# Patient Record
Sex: Male | Born: 1990
Health system: Southern US, Community
[De-identification: ages and names within clinical notes are randomized; demographics above are authoritative.]

## PROBLEM LIST (undated history)

## (undated) DIAGNOSIS — F319 Bipolar disorder, unspecified: Secondary | ICD-10-CM

## (undated) DIAGNOSIS — R443 Hallucinations, unspecified: Secondary | ICD-10-CM

## (undated) DIAGNOSIS — F259 Schizoaffective disorder, unspecified: Secondary | ICD-10-CM

## (undated) DIAGNOSIS — F419 Anxiety disorder, unspecified: Secondary | ICD-10-CM

## (undated) DIAGNOSIS — J45909 Unspecified asthma, uncomplicated: Secondary | ICD-10-CM

## (undated) DIAGNOSIS — F209 Schizophrenia, unspecified: Secondary | ICD-10-CM

---
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
## 2013-05-09 ENCOUNTER — Emergency Department (HOSPITAL_COMMUNITY)
Admission: EM | Admit: 2013-05-09 | Discharge: 2013-05-09 | Disposition: A | Discharge status: Home / Self Care | Payer: 59 | Attending: Emergency Medicine | Admitting: Emergency Medicine

## 2013-05-09 ENCOUNTER — Emergency Department (HOSPITAL_COMMUNITY): Payer: 59

## 2013-05-09 ENCOUNTER — Encounter (HOSPITAL_COMMUNITY): Payer: Self-pay | Admitting: Emergency Medicine

## 2013-05-09 DIAGNOSIS — Z5900 Homelessness unspecified: Secondary | ICD-10-CM | POA: Insufficient documentation

## 2013-05-09 DIAGNOSIS — R079 Chest pain, unspecified: Secondary | ICD-10-CM | POA: Insufficient documentation

## 2013-05-09 DIAGNOSIS — Z8659 Personal history of other mental and behavioral disorders: Secondary | ICD-10-CM | POA: Insufficient documentation

## 2013-05-09 DIAGNOSIS — R52 Pain, unspecified: Secondary | ICD-10-CM | POA: Insufficient documentation

## 2013-05-09 DIAGNOSIS — F411 Generalized anxiety disorder: Secondary | ICD-10-CM | POA: Insufficient documentation

## 2013-05-09 HISTORY — DX: Schizophrenia, unspecified: F20.9

## 2013-05-09 LAB — CBC
HEMATOCRIT: 46.2 % (ref 39.0–52.0)
Hemoglobin: 16.6 g/dL (ref 13.0–17.0)
MCH: 32.5 pg (ref 26.0–34.0)
MCHC: 35.9 g/dL (ref 30.0–36.0)
MCV: 90.4 fL (ref 78.0–100.0)
Platelets: 183 10*3/uL (ref 150–400)
RBC: 5.11 MIL/uL (ref 4.22–5.81)
RDW: 13.1 % (ref 11.5–15.5)
WBC: 7.9 10*3/uL (ref 4.0–10.5)

## 2013-05-09 LAB — BASIC METABOLIC PANEL
BUN: 20 mg/dL (ref 6–23)
CO2: 24 mEq/L (ref 19–32)
CREATININE: 0.75 mg/dL (ref 0.50–1.35)
Calcium: 9.7 mg/dL (ref 8.4–10.5)
Chloride: 100 mEq/L (ref 96–112)
GFR calc Af Amer: >90 mL/min (ref >90)
Glucose, Bld: 86 mg/dL (ref 70–99)
POTASSIUM: 4.7 meq/L (ref 3.7–5.3)
Sodium: 138 mEq/L (ref 137–147)

## 2013-05-09 LAB — I-STAT TROPONIN, ED: TROPONIN I, POC: 0 ng/mL (ref 0.00–0.08)

## 2013-05-09 MED ORDER — NAPROXEN 250 MG PO TABS
500.0000 mg | ORAL_TABLET | Freq: Once | ORAL | Status: AC
  Administered 2013-05-09: 500 mg via ORAL
  Filled 2013-05-09: qty 2

## 2013-05-09 MED ORDER — NAPROXEN 500 MG PO TABS: 500.0000 mg | ORAL_TABLET | Freq: Two times a day (BID) | ORAL | Status: DC

## 2013-05-09 NOTE — Discharge Instructions (Signed)
Your caregiver has diagnosed you as having chest pain that is nonspecific for one problem. This means that after looking at you and examining you and ordering tests (such as blood work, chest x-rays and EKG), your caregiver does not believe that the problem is serious enough to need watching in the hospital. This judgment is often made after testing shows no acute heart attack and you are at low risk for sudden acute heart condition. Chest pain comes from many different causes.  Seek immediate medical attention if:   You have severe chest pain, especially if the pain is crushing or pressure-like and spreads to the arms, back, neck, or jaw, or if you have sweating, nausea, shortness of breath. This is an emergency. Don't wait to see if the pain will go away. Get medical help at once. Call 911 immediately. Do not drive herself to the hospital.   Your chest pain gets worse and does not go away with rest.   You have an attack of chest pain lasting longer than usual, despite rest and treatment with the medications your caregiver has prescribed   You awaken from sleep with chest pain or shortness of breath.   You feel faint or dizzy   You have chest pain not typical of your usual pain for which you originally saw your caregiver.  You must have a repeat evaluation within 24 hours for a recheck of your heart.  Please call your doctor this morning to schedule this appointment. If you do not have a family doctor, please see the list of doctors below.  RESOURCE GUIDE  Dental Problems  Patients with Medicaid: [facility]                      Dental [address]                                           [address] Phone:  [phone]                                                  Phone:  [phone]  If unable to pay or uninsured, contact:  [facility] or [facility] to become qualified for the adult dental clinic.  Chronic Pain  Problems Contact [facility]  [phone] Patients need to be referred by their primary care doctor.  Insufficient Money for Medicine Contact [employer]:  call "211" or [facility] 271-5999.  No Primary Care Doctor Call [facility]  832-8000 Other agencies that provide inexpensive medical care    [facility]  832-8035    [facility]  [phone]    [facility]  [phone]    [facility]  832-4777    [facility]  [phone]    [facility]  [phone]  Psychological Services [facility]  [phone] [facility]  [phone] [facility]   800 853-5163 (emergency services 641-4993)  Substance Abuse Resources [facility]  336-882-2125 [facility] 336-784-9470 [facility] 336-285-9073 [facility] 336-845-3988 Residential & Outpatient Substance Abuse Program  800-659-3381  Abuse/Neglect [place] Child Abuse Hotline (336) 641-3795 [place] [facility] Abuse Hotline 800-378-5315 (After Hours)  Emergency Shelter [facility] (336) 271-5985  [facility] (336) 275-9566 [facility] (704) 372-4663  MRSA Hotline #:   832-7006    [place] [facility]  [facility] [place]     [employer]                          [facility] [address] [place]                       [address]      [address]  [place]                                                [place]                            [place] Phone:  [phone]                                   Phone:  [phone]                 Phone:  [phone]  [facility] Phone:  [phone]  [place] Child Abuse Hotline (336) 342-1394 (336) 342-3537 (After Hours)    

## 2013-05-09 NOTE — ED Notes (Signed)
Pt. reports mid chest pain for 2 weeks denies injury , no SOB or nausea.
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
## 2013-06-13 ENCOUNTER — Inpatient Hospital Stay (HOSPITAL_COMMUNITY)
Admission: EM | Admit: 2013-06-13 | Discharge: 2013-06-18 | DRG: 885 | Disposition: A | Discharge status: Home / Self Care | Payer: 59 | Source: Intra-hospital | Attending: Psychiatry | Admitting: Psychiatry

## 2013-06-13 ENCOUNTER — Encounter (HOSPITAL_COMMUNITY): Payer: Self-pay | Admitting: Emergency Medicine

## 2013-06-13 ENCOUNTER — Encounter (HOSPITAL_COMMUNITY): Payer: Self-pay | Admitting: *Deleted

## 2013-06-13 ENCOUNTER — Emergency Department (HOSPITAL_COMMUNITY)
Admission: EM | Admit: 2013-06-13 | Discharge: 2013-06-13 | Disposition: A | Discharge status: Other Acute Inpatient Hospital | Payer: 59 | Attending: Emergency Medicine | Admitting: Emergency Medicine

## 2013-06-13 DIAGNOSIS — F259 Schizoaffective disorder, unspecified: Secondary | ICD-10-CM

## 2013-06-13 DIAGNOSIS — F329 Major depressive disorder, single episode, unspecified: Secondary | ICD-10-CM | POA: Insufficient documentation

## 2013-06-13 DIAGNOSIS — R443 Hallucinations, unspecified: Secondary | ICD-10-CM | POA: Insufficient documentation

## 2013-06-13 DIAGNOSIS — F17200 Nicotine dependence, unspecified, uncomplicated: Secondary | ICD-10-CM | POA: Diagnosis present

## 2013-06-13 DIAGNOSIS — F1910 Other psychoactive substance abuse, uncomplicated: Secondary | ICD-10-CM | POA: Diagnosis present

## 2013-06-13 DIAGNOSIS — Z5900 Homelessness unspecified: Secondary | ICD-10-CM

## 2013-06-13 DIAGNOSIS — Z87898 Personal history of other specified conditions: Secondary | ICD-10-CM

## 2013-06-13 DIAGNOSIS — R079 Chest pain, unspecified: Secondary | ICD-10-CM | POA: Insufficient documentation

## 2013-06-13 DIAGNOSIS — R45851 Suicidal ideations: Secondary | ICD-10-CM | POA: Insufficient documentation

## 2013-06-13 DIAGNOSIS — F209 Schizophrenia, unspecified: Secondary | ICD-10-CM | POA: Insufficient documentation

## 2013-06-13 DIAGNOSIS — F919 Conduct disorder, unspecified: Secondary | ICD-10-CM | POA: Insufficient documentation

## 2013-06-13 DIAGNOSIS — F29 Unspecified psychosis not due to a substance or known physiological condition: Secondary | ICD-10-CM | POA: Diagnosis present

## 2013-06-13 DIAGNOSIS — Z79899 Other long term (current) drug therapy: Secondary | ICD-10-CM | POA: Insufficient documentation

## 2013-06-13 DIAGNOSIS — F2081 Schizophreniform disorder: Principal | ICD-10-CM | POA: Diagnosis present

## 2013-06-13 DIAGNOSIS — F3289 Other specified depressive episodes: Secondary | ICD-10-CM | POA: Insufficient documentation

## 2013-06-13 DIAGNOSIS — J45909 Unspecified asthma, uncomplicated: Secondary | ICD-10-CM | POA: Diagnosis present

## 2013-06-13 HISTORY — DX: Anxiety disorder, unspecified: F41.9

## 2013-06-13 HISTORY — DX: Unspecified asthma, uncomplicated: J45.909

## 2013-06-13 LAB — COMPREHENSIVE METABOLIC PANEL
ALT: 22 U/L (ref 0–53)
AST: 20 U/L (ref 0–37)
Albumin: 4.4 g/dL (ref 3.5–5.2)
Alkaline Phosphatase: 67 U/L (ref 39–117)
BUN: 14 mg/dL (ref 6–23)
CO2: 24 mEq/L (ref 19–32)
CREATININE: 0.93 mg/dL (ref 0.50–1.35)
Calcium: 10.3 mg/dL (ref 8.4–10.5)
Chloride: 103 mEq/L (ref 96–112)
GFR calc Af Amer: >90 mL/min (ref >90)
GFR calc non Af Amer: >90 mL/min (ref >90)
Glucose, Bld: 74 mg/dL (ref 70–99)
Potassium: 4.1 mEq/L (ref 3.7–5.3)
SODIUM: 140 meq/L (ref 137–147)
TOTAL PROTEIN: 8.2 g/dL (ref 6.0–8.3)
Total Bilirubin: 0.4 mg/dL (ref 0.3–1.2)

## 2013-06-13 LAB — CBC
HCT: 46.4 % (ref 39.0–52.0)
Hemoglobin: 16.3 g/dL (ref 13.0–17.0)
MCH: 31.5 pg (ref 26.0–34.0)
MCHC: 35.1 g/dL (ref 30.0–36.0)
MCV: 89.7 fL (ref 78.0–100.0)
PLATELETS: 201 10*3/uL (ref 150–400)
RBC: 5.17 MIL/uL (ref 4.22–5.81)
RDW: 12.8 % (ref 11.5–15.5)
WBC: 7.5 10*3/uL (ref 4.0–10.5)

## 2013-06-13 LAB — RAPID URINE DRUG SCREEN, HOSP PERFORMED
Amphetamines: NOT DETECTED
BENZODIAZEPINES: NOT DETECTED
Barbiturates: NOT DETECTED
COCAINE: NOT DETECTED
OPIATES: NOT DETECTED
TETRAHYDROCANNABINOL: NOT DETECTED

## 2013-06-13 LAB — ETHANOL: Alcohol, Ethyl (B): <11 mg/dL (ref 0–11)

## 2013-06-13 LAB — LITHIUM LEVEL: Lithium Lvl: <0.25 mEq/L — ABNORMAL LOW (ref 0.80–1.40)

## 2013-06-13 MED ORDER — ACETAMINOPHEN 325 MG PO TABS: 650.0000 mg | ORAL_TABLET | Freq: Four times a day (QID) | ORAL | Status: DC | PRN

## 2013-06-13 MED ORDER — IBUPROFEN 200 MG PO TABS: 600.0000 mg | ORAL_TABLET | Freq: Three times a day (TID) | ORAL | Status: DC | PRN

## 2013-06-13 MED ORDER — ALUM & MAG HYDROXIDE-SIMETH 200-200-20 MG/5ML PO SUSP: 30.0000 mL | ORAL | Status: DC | PRN

## 2013-06-13 MED ORDER — QUETIAPINE FUMARATE 200 MG PO TABS
200.0000 mg | ORAL_TABLET | Freq: Every day | ORAL | Status: DC
  Administered 2013-06-13: 200 mg via ORAL
  Filled 2013-06-13 (×4): qty 1

## 2013-06-13 MED ORDER — MAGNESIUM HYDROXIDE 400 MG/5ML PO SUSP: 30.0000 mL | Freq: Every day | ORAL | Status: DC | PRN

## 2013-06-13 MED ORDER — ZOLPIDEM TARTRATE 5 MG PO TABS: 5.0000 mg | ORAL_TABLET | Freq: Every evening | ORAL | Status: DC | PRN

## 2013-06-13 MED ORDER — NICOTINE 14 MG/24HR TD PT24
14.0000 mg | MEDICATED_PATCH | Freq: Every day | TRANSDERMAL | Status: DC
  Administered 2013-06-15 – 2013-06-16 (×2): 14 mg via TRANSDERMAL
  Filled 2013-06-13 (×8): qty 1

## 2013-06-13 MED ORDER — NICOTINE 21 MG/24HR TD PT24
21.0000 mg | MEDICATED_PATCH | Freq: Every day | TRANSDERMAL | Status: DC
  Administered 2013-06-13: 21 mg via TRANSDERMAL
  Filled 2013-06-13: qty 1

## 2013-06-13 MED ORDER — ONDANSETRON HCL 4 MG PO TABS: 4.0000 mg | ORAL_TABLET | Freq: Three times a day (TID) | ORAL | Status: DC | PRN

## 2013-06-13 MED ORDER — QUETIAPINE FUMARATE 50 MG PO TABS: 50.0000 mg | ORAL_TABLET | ORAL | Status: DC | PRN

## 2013-06-13 MED ORDER — ACETAMINOPHEN 325 MG PO TABS: 650.0000 mg | ORAL_TABLET | ORAL | Status: DC | PRN

## 2013-06-13 NOTE — ED Provider Notes (Signed)
CSN: [id number]     Arrival date & time 06/13/13  1606 History   First MD Initiated Contact with Patient 06/13/13 1637     Chief Complaint  Patient presents with  . Medical Clearance     (Consider location/radiation/quality/duration/timing/severity/associated sxs/prior Treatment) The history is provided by the patient.   patient presents with depression. He's had suicidal thoughts. He states it his been going for years but got worse now. He states that he just lost his house and is going to be homeless again. He states his been hearing voices also. Patient states he is on Latuda, lithium and Seroquel. Among other medications. He states he does drink, but does not think he will go into withdrawal. He also smokes cigarettes heavily. He also states she's been getting chest pain for months. He was seen in the ED for that recently.  Past Medical History  Diagnosis Date  . Schizophrenia    No past surgical history on file. No family history on file. History  Substance Use Topics  . Smoking status: Never Smoker   . Smokeless tobacco: Not on file  . Alcohol Use: Yes    Review of Systems  Constitutional: Negative for activity change and appetite change.  Eyes: Negative for pain.  Respiratory: Negative for chest tightness and shortness of breath.   Cardiovascular: Positive for chest pain. Negative for leg swelling.  Gastrointestinal: Negative for nausea, vomiting, abdominal pain and diarrhea.  Genitourinary: Negative for flank pain.  Musculoskeletal: Negative for back pain and neck stiffness.  Skin: Negative for rash.  Neurological: Negative for weakness, numbness and headaches.  Psychiatric/Behavioral: Positive for suicidal ideas and behavioral problems.      Allergies  Review of patient's allergies indicates no known allergies.  Home Medications   Current Outpatient Rx  Name  Route  Sig  Dispense  Refill  . Ginkgo Biloba 100 MG CAPS   Oral   Take 1 capsule by mouth 3 (three)  times daily.         . haloperidol (HALDOL) 2 MG tablet   Oral   Take 2 mg by mouth at bedtime.         Marland Kitchen lurasidone (LATUDA) 40 MG TABS tablet   Oral   Take 40 mg by mouth at bedtime. Pt taking 40 mg nightly x 1 week, and then 20 mg weekly x 1 week and then to stop this medication. Pt to start this dose tonight. Was on 60 mg before today.         Marland Kitchen QUEtiapine Fumarate (SEROQUEL XR) 150 MG 24 hr tablet   Oral   Take 150 mg by mouth at bedtime. Pt to take 150 mg x 4 days and then increase to 300 mg x 4 days and then increase to 400 mg.         . sertraline (ZOLOFT) 100 MG tablet   Oral   Take 100 mg by mouth at bedtime.         . vitamin E 400 UNIT capsule   Oral   Take 400 Units by mouth at bedtime.          BP 143/84  Pulse 95  Temp(Src) 98.6 F (37 C) (Oral)  Resp 16  SpO2 100% Physical Exam  Nursing note and vitals reviewed. Constitutional: He is oriented to person, place, and time. He appears well-developed and well-nourished.  HENT:  Head: Normocephalic and atraumatic.  Eyes: EOM are normal. Pupils are equal, round, and reactive to light.  Neck: Normal range of motion. Neck supple.  Cardiovascular: Normal rate, regular rhythm and normal heart sounds.   No murmur heard. Pulmonary/Chest: Effort normal and breath sounds normal.  Abdominal: Soft. Bowel sounds are normal. He exhibits no distension and no mass. There is no tenderness. There is no rebound and no guarding.  Musculoskeletal: Normal range of motion. He exhibits no edema.  Neurological: He is alert and oriented to person, place, and time. No cranial nerve deficit.  Skin: Skin is warm and dry.  Psychiatric:  Patient appears depressed    ED Course  Procedures (including critical care time) Labs Review Labs Reviewed  LITHIUM LEVEL - Abnormal; Notable for the following:    Lithium Lvl <0.25 (*)    All other components within normal limits  CBC  COMPREHENSIVE METABOLIC PANEL  ETHANOL  URINE  RAPID DRUG SCREEN (HOSP PERFORMED)   Imaging Review No results found.   EKG Interpretation None      MDM   Final diagnoses:  Suicidal ideation    Patient with suicidal thoughts and alcohol abuse. History of schizophrenia. Has been accepted at behavioral health.    Jasper Riling. Alvino Chapel, MD 06/13/13 [phone]

## 2013-06-13 NOTE — ED Notes (Signed)
Patient observed anxious. Denied complaint. Requested to take a shower. After shower patient was relaxed. Continues to deny complaint. States that he "I just want to learn to be happy".  Encouragement offered. Patient safety maintained, Q 15 checks continue.

## 2013-06-13 NOTE — ED Notes (Signed)
PT states that he has been having suicidal thoughts.  Also very paranoid, thinking that everyone is judging him and "watching his every move".  States that he has so many plans to hurt himself that he cannot narrow down one to tell me about.  Denies HI.

## 2013-06-13 NOTE — ED Notes (Signed)
Pt admits to SI denies HI
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
## 2013-06-13 NOTE — Progress Notes (Addendum)
Per, Heloise Purpura patient meets criteria for inpatient hospitalization at [facility].  The patient has been accepted to [facility] Bed 504-1.  Dr. Georgiann Hahn is the accepting doctor.    The nurse will arrange transportation through [facility].  Writer has faxed the support paperwork.  Writer has informed the and the nurse and the ER MD (Dr. Leonides Schanz)  The nurse can call report and transportation after 7pm, per [facility].

## 2013-06-13 NOTE — ED Notes (Signed)
States "It's not necessarily that I want to kill myself. I just don't want to be here anymore". Does contract for safety.

## 2013-06-14 DIAGNOSIS — F29 Unspecified psychosis not due to a substance or known physiological condition: Secondary | ICD-10-CM | POA: Diagnosis present

## 2013-06-14 DIAGNOSIS — F2081 Schizophreniform disorder: Principal | ICD-10-CM

## 2013-06-14 DIAGNOSIS — F1999 Other psychoactive substance use, unspecified with unspecified psychoactive substance-induced disorder: Secondary | ICD-10-CM

## 2013-06-14 HISTORY — DX: Unspecified psychosis not due to a substance or known physiological condition: F29

## 2013-06-14 MED ORDER — RISPERIDONE 1 MG PO TBDP
1.0000 mg | ORAL_TABLET | Freq: Every day | ORAL | Status: DC
  Administered 2013-06-15 – 2013-06-17 (×3): 1 mg via ORAL
  Filled 2013-06-14 (×3): qty 1
  Filled 2013-06-14: qty 3
  Filled 2013-06-14 (×4): qty 1

## 2013-06-14 MED ORDER — MAGNESIUM HYDROXIDE 400 MG/5ML PO SUSP: 30.0000 mL | Freq: Every day | ORAL | Status: DC | PRN

## 2013-06-14 MED ORDER — ALUM & MAG HYDROXIDE-SIMETH 200-200-20 MG/5ML PO SUSP: 30.0000 mL | ORAL | Status: DC | PRN

## 2013-06-14 MED ORDER — DIPHENHYDRAMINE HCL 25 MG PO CAPS
50.0000 mg | ORAL_CAPSULE | Freq: Four times a day (QID) | ORAL | Status: DC | PRN
  Administered 2013-06-14 – 2013-06-17 (×2): 50 mg via ORAL
  Filled 2013-06-14 (×3): qty 2

## 2013-06-14 MED ORDER — BENZTROPINE MESYLATE 1 MG PO TABS
1.0000 mg | ORAL_TABLET | Freq: Every day | ORAL | Status: DC
  Administered 2013-06-15 – 2013-06-18 (×4): 1 mg via ORAL
  Filled 2013-06-14 (×8): qty 1
  Filled 2013-06-14: qty 3

## 2013-06-14 MED ORDER — ACETAMINOPHEN 325 MG PO TABS: 650.0000 mg | ORAL_TABLET | Freq: Four times a day (QID) | ORAL | Status: DC | PRN
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
## 2013-06-14 NOTE — Progress Notes (Signed)
Psychoeducational Group Note  Date:  06/14/2013 Time:  8:00 p.m.   Group Topic/Focus:  Wrap-Up Group:   The focus of this group is to help patients review their daily goal of treatment and discuss progress on daily workbooks.  Participation Level: Did Not Attend  Participation Quality:  Not Applicable  Affect:  Not Applicable  Cognitive:  Not Applicable  Insight:  Not Applicable  Engagement in Group: Not Applicable  Additional Comments:  The patient didn't attend group since he was asleep in his room.   Gennette Pac 06/14/2013, 11:10 PM
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
## 2013-06-14 NOTE — Progress Notes (Signed)
Baseline EKG performed on patient.
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
## 2013-06-14 NOTE — Progress Notes (Signed)
Patient ID: Leon Mason, male   DOB: [birth year], 23 y.o.   MRN: [id number]  [facility] 0900 AM   The focus of this group is to educate the patient on the purpose and policies of crisis stabilization and provide a format to answer questions about their admission. The group details unit policies and expectations of patients while admitted  Patient attended group and had many questions about how the hospital operates. Writer answered the patient's questions and informed him that when he leaves he will have plenty of resources that will help him continue on his way to recovery. Patient was polite but anxious during group. Patient reported that his favorite activity is playing video games with an actual plot.
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
## 2013-06-14 NOTE — Tx Team (Signed)
Initial Interdisciplinary Treatment Plan  PATIENT STRENGTHS: (choose at least two) Average or above average intelligence Capable of independent living General fund of knowledge Motivation for treatment/growth  PATIENT STRESSORS: Financial difficulties Marital or family conflict Medication change or noncompliance Substance abuse Traumatic event   PROBLEM LIST: Problem List/Patient Goals Date to be addressed Date deferred Reason deferred Estimated date of resolution  Depression- conflict with father over money; girlfriend has been seeing someone else.      Risk for self harm      "My psychiatrist is in the process of adjusting my medications"      Feeling paranoid sometimes                                     DISCHARGE CRITERIA:  Ability to meet basic life and health needs Improved stabilization in mood, thinking, and/or behavior Motivation to continue treatment in a less acute level of care Need for constant or close observation no longer present Verbal commitment to aftercare and medication compliance  PRELIMINARY DISCHARGE PLAN: Attend aftercare/continuing care group Outpatient therapy Return to previous living arrangement  PATIENT/FAMIILY INVOLVEMENT: This treatment plan has been presented to and reviewed with the patient, Leon Mason, and/or family member.  The patient and family have been given the opportunity to ask questions and make suggestions.  Leon Mason Leon Mason 06/14/2013, 12:05 AM

## 2013-06-14 NOTE — Progress Notes (Signed)
D: Patient's affect appropriate to circumstance and mood is anxious. He rates depression "3", feelings of hopelessness "0" and anxiety "5". Patient is attending groups, interactive with peers and visible in the milieu. No scheduled medications at this time, besides a nicotine patch.  A: Support and encouragement provided to patient. Writer offered the nicotine patch, but patient refused. Maintain Q15 minute checks for safety.  R: Patient receptive. Denies SI/HI and auditory/visual hallucinations. Patient remains safe.

## 2013-06-14 NOTE — BHH Suicide Risk Assessment (Signed)
Leon Mason INPATIENT:  Family/Significant Other Suicide Prevention Education  Suicide Prevention Education:  Education Completed; Leon Mason, Mother, [phone];  has been identified by the patient as the family member/significant other with whom the patient will be residing, and identified as the person(s) who will aid the patient in the event of a mental health crisis (suicidal ideations/suicide attempt).  With written consent from the patient, the family member/significant other has been provided the following suicide prevention education, prior to the and/or following the discharge of the patient.  Mother advised she is a Equities trader and knows what to do in an emergency.   The suicide prevention education provided includes the following:  Suicide risk factors  Suicide prevention and interventions  National Suicide Hotline telephone number  [facility] assessment telephone number  [facility] Emergency Assistance [place] and/or Residential Mobile Crisis Unit telephone number  Request made of family/significant other to:  Remove weapons (e.g., guns, rifles, knives), all items previously/currently identified as safety concern. Mother advised patient does not have access to weapons.   Remove drugs/medications (over-the-counter, prescriptions, illicit drugs), all items previously/currently identified as a safety concern.  The family member/significant other verbalizes understanding of the suicide prevention education information provided.  The family member/significant other agrees to remove the items of safety concern listed above.  Leon Mason Leon Mason 06/14/2013, 3:26 PM
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
## 2013-06-14 NOTE — Progress Notes (Signed)
Recreation Therapy Notes  Animal-Assisted Activity/Therapy (AAA/T) Program Checklist/Progress Notes Patient Eligibility Criteria Checklist & Daily Group note for Rec Tx Intervention  Date: 04.09.2015 Time: 2:45pm Location: [address]    AAA/T Program Assumption of Risk Form signed by Patient/ or Parent Legal Guardian yes  Patient is free of allergies or sever asthma yes  Patient reports no fear of animals yes  Patient reports no history of cruelty to animals yes   Patient understands his/her participation is voluntary yes  Behavioral Response: DID NOT ATTEND.  Laureen Ochs Minami Arriaga, LRT/CTRS  Lane Hacker 06/14/2013 3:56 PM

## 2013-06-15 DIAGNOSIS — R45851 Suicidal ideations: Secondary | ICD-10-CM

## 2013-06-15 NOTE — Progress Notes (Signed)
Patient ID: Leon Mason, male   DOB: Sep 28, 1990, 23 y.o.   MRN: [id number] D: pt. Resting eyes closed, respirations even. A:Writer observed for s/s of distress. Staff will monitor q70min for safety. R: Pt. Is safe on the unit, no distress noted, respirations unlabored.

## 2013-06-15 NOTE — Tx Team (Signed)
Interdisciplinary Treatment Plan Update   Date Reviewed:  06/15/2013  Time Reviewed:  9:55 AM  Progress in Treatment:   Attending groups: Yes Participating in groups: Yes Taking medication as prescribed: Yes  Tolerating medication: Yes Family/Significant other contact made:  Yes, collateral contact with mother. Patient understands diagnosis: Yes  Discussing patient identified problems/goals with staff: Yes Medical problems stabilized or resolved: Yes Denies suicidal/homicidal ideation: Yes Patient has not harmed self or others: Yes  For review of initial/current patient goals, please see plan of care.  Estimated Length of Stay:  3-5 dyas  Reasons for Continued Hospitalization:  Anxiety Depression Medication stabilization  New Problems/Goals identified:    Discharge Plan or Barriers:   Home with outpatient follow up [facility]  Additional Comments:  Attendees:  Patient:  06/15/2013 9:55 AM   Signature: Mylinda Latina, MD 06/15/2013 9:55 AM  Signature:  Nena Polio, PA 06/15/2013 9:55 AM  Signature:  06/15/2013 9:55 AM  Signature: 06/15/2013 9:55 AM  Signature:   06/15/2013 9:55 AM  Signature:  Joette Catching, LCSW 06/15/2013 9:55 AM  Signature:  06/15/2013 9:55 AM  Signature:  Lucinda Dell, Care Coordinator [facility] 06/15/2013 9:55 AM  Signature:   06/15/2013 9:55 AM  Signature:  06/15/2013  9:55 AM  Signature:   Lars Pinks, RN [facility] 06/15/2013  9:55 AM  Signature:  06/15/2013  9:55 AM    Scribe for Treatment Team:   Joette Catching,  06/15/2013 9:55 AM

## 2013-06-15 NOTE — Progress Notes (Signed)
[facility] MD Progress Note  06/15/2013 2:53 PM BINH DOTEN  MRN:  [id number] Subjective:  Patient states he is feeling much better after attending the groups. Did not take the Risperdal because no one spoke to him about it. He states his depression is 3/10, denies SI/HI and states his Anxiety is a 3/10. 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  Schizophrenia    .  Anxiety    .  Asthma     AXIS IV: economic problems, housing problems and problems with primary support group  AXIS V: 41-50 serious symptoms  ADL's:  Intact  Sleep: Good  Appetite:  Good  Suicidal Ideation:  decreasing Homicidal Ideation:  denies AEB (as evidenced by):  Psychiatric Specialty Exam: Physical Exam  ROS  Blood pressure 119/84, pulse 98, temperature 98.1 F (36.7 C), temperature source Oral, resp. rate 20, height 6' (1.829 m), weight 103.42 kg (228 lb), SpO2 97.00%.Body mass index is 30.92 kg/(m^2).  General Appearance: Fairly Groomed  Engineer, water::  Good  Speech:  Clear and Coherent  Volume:  Normal  Mood:  Depressed  Affect:  Congruent  Thought Process:  Goal Directed  Orientation:  Full (Time, Place, and Person)  Thought Content:  Hallucinations: Visual  Suicidal Thoughts:  No  Homicidal Thoughts:  No  Memory:  NA  Judgement:  Poor  Insight:  Shallow  Psychomotor Activity:  Normal  Concentration:  Poor  Recall:  [place] of Knowledge:Fair  Language: Good  Akathisia:  No  Handed:  Right  AIMS (if indicated):     Assets:  Communication Skills Desire for Improvement Physical Health  Sleep:  Number of Hours: 5.5   Musculoskeletal: Strength & Muscle Tone: within normal  limits Gait & Station: normal Patient leans: N/A  Current Medications: Current Facility-Administered Medications  Medication Dose Route Frequency Provider Last Rate Last Dose  . acetaminophen (TYLENOL) tablet 650 mg  650 mg Oral Q6H PRN Nena Polio, PA-C      . alum & mag hydroxide-simeth (MAALOX/MYLANTA) 200-200-20 MG/5ML suspension 30 mL  30 mL Oral Q4H PRN Nena Polio, PA-C      . benztropine (COGENTIN) tablet 1 mg  1 mg Oral Daily Nena Polio, PA-C   1 mg at 06/15/13 5956  . diphenhydrAMINE (BENADRYL) capsule 50 mg  50 mg Oral Q6H PRN Nena Polio, PA-C   50 mg at 06/14/13 2146  . magnesium hydroxide (MILK OF MAGNESIA) suspension 30 mL  30 mL Oral Daily PRN Nena Polio, PA-C      . nicotine (NICODERM CQ - dosed in mg/24 hours) patch 14 mg  14 mg Transdermal Daily Nena Polio, PA-C   14 mg at 06/15/13 0939  . risperiDONE (RISPERDAL M-TABS) disintegrating tablet 1 mg  1 mg Oral QHS Nena Polio, PA-C        Lab Results:  Results for orders placed during the hospital encounter of 06/13/13 (from the past 48 hour(s))  CBC     Status: None   Collection Time    06/13/13  4:45 PM      Result Value Ref Range   WBC 7.5  4.0 - 10.5 K/uL   RBC 5.17  4.22 - 5.81 MIL/uL   Hemoglobin  16.3  13.0 - 17.0 g/dL   HCT 46.4  39.0 - 52.0 %   MCV 89.7  78.0 - 100.0 fL   MCH 31.5  26.0 - 34.0 pg   MCHC 35.1  30.0 - 36.0 g/dL   RDW 12.8  11.5 - 15.5 %   Platelets 201  150 - 400 K/uL  COMPREHENSIVE METABOLIC PANEL     Status: None   Collection Time    06/13/13  4:45 PM      Result Value Ref Range   Sodium 140  137 - 147 mEq/L   Potassium 4.1  3.7 - 5.3 mEq/L   Chloride 103  96 - 112 mEq/L   CO2 24  19 - 32 mEq/L   Glucose, Bld 74  70 - 99 mg/dL   BUN 14  6 - 23 mg/dL   Creatinine, Ser 0.93  0.50 - 1.35 mg/dL   Calcium 10.3  8.4 - 10.5 mg/dL   Total Protein 8.2  6.0 - 8.3 g/dL   Albumin 4.4  3.5 - 5.2 g/dL   AST 20  0 - 37 U/L   ALT 22  0 - 53 U/L   Alkaline Phosphatase 67  39 -  117 U/L   Total Bilirubin 0.4  0.3 - 1.2 mg/dL   GFR calc non Af Amer >90  >90 mL/min   GFR calc Af Amer >90  >90 mL/min   Comment: (NOTE)     The eGFR has been calculated using the CKD EPI equation.     This calculation has not been validated in all clinical situations.     eGFR's persistently <90 mL/min signify possible Chronic Kidney     Disease.  ETHANOL     Status: None   Collection Time    06/13/13  4:45 PM      Result Value Ref Range   Alcohol, Ethyl (B) <11  0 - 11 mg/dL   Comment:            LOWEST DETECTABLE LIMIT FOR     SERUM ALCOHOL IS 11 mg/dL     FOR MEDICAL PURPOSES ONLY  LITHIUM LEVEL     Status: Abnormal   Collection Time    06/13/13  4:45 PM      Result Value Ref Range   Lithium Lvl <0.25 (*) 0.80 - 1.40 mEq/L  URINE RAPID DRUG SCREEN (HOSP PERFORMED)     Status: None   Collection Time    06/13/13  4:59 PM      Result Value Ref Range   Opiates NONE DETECTED  NONE DETECTED   Cocaine NONE DETECTED  NONE DETECTED   Benzodiazepines NONE DETECTED  NONE DETECTED   Amphetamines NONE DETECTED  NONE DETECTED   Tetrahydrocannabinol NONE DETECTED  NONE DETECTED   Barbiturates NONE DETECTED  NONE DETECTED   Comment:            DRUG SCREEN FOR MEDICAL PURPOSES     ONLY.  IF CONFIRMATION IS NEEDED     FOR ANY PURPOSE, NOTIFY LAB     WITHIN 5 DAYS.                LOWEST DETECTABLE LIMITS     FOR URINE DRUG SCREEN     Drug Class       Cutoff (ng/mL)     Amphetamine      1000     Barbiturate      200     Benzodiazepine  814     Tricyclics       481     Opiates          300     Cocaine          300     THC              50    Physical Findings: AIMS: Facial and Oral Movements Muscles of Facial Expression: None, normal Lips and Perioral Area: None, normal Jaw: None, normal Tongue: None, normal,Extremity Movements Upper (arms, wrists, hands, fingers): None, normal Lower (legs, knees, ankles, toes): None, normal, Trunk Movements Neck, shoulders, hips: None,  normal, Overall Severity Severity of abnormal movements (highest score from questions above): None, normal Incapacitation due to abnormal movements: None, normal Patient's awareness of abnormal movements (rate only patient's report): No Awareness, Dental Status Current problems with teeth and/or dentures?: No Does patient usually wear dentures?: No  CIWA:  CIWA-Ar Total: 4 COWS:     Treatment Plan Summary: Daily contact with patient to assess and evaluate symptoms and progress in treatment Medication management  Plan: 1. Admit for crisis management and stabilization. 2. Medication management to reduce current symptoms to base line and improve the patient's overall level of functioning. 3. Treat health problems as indicated. 4. Develop treatment plan to decrease risk of relapse upon discharge and to reduce the need for readmission. 5. Psycho-social education regarding relapse prevention and self care. 6. Health care follow up as needed for medical problems. New: 1. Risperdal 43m po at hs is discussed with risks benefits, and side effects with the patient. All questions are answered to the patient's satisfaction. 2. Patient education done regarding the use of psychotropic medications, risks of 2 or more antipsychotics, and his symptoms. 3. Discussed alcohol and substance abuse with the patient regarding his symptoms. 4. Disposition in place. 5. ELOS: 3-4 days anticipate review for discharge on Monday.  Medical Decision Making Problem Points:  Established problem, stable/improving (1) Data Points:  Review of medication regiment & side effects (2)  I certify that inpatient services furnished can reasonably be expected to improve the patient's condition.   NNena Polio4/12/2013, 2:53 PM  Reviewed the information documented and agree with the treatment plan.  JDurward Parcel4/12/2013 6:29 PM

## 2013-06-15 NOTE — Progress Notes (Signed)
D) Pt rates his depression and hopelessness both at a 5. Admits to thoughts of SI. Pt has refused his medications today stating "no one has discussed these medications with me and I won't take them until I talk with someone". Has been talking about taking Gingko and vitamins to help himself. States that he goes to a different school now because he can't go back to [employer] [facility] [employer] due to his behavior in the past. Did state "there are people reading my mind" earlier in the shift. Pt states his goal "is to show everyone that someone with schizophrenia is able to succeed". A) Given support, reassurance and praise. Encouraged to talk with the doctor about his concerns over his medications. R) Pt contracts for safety while on the unit.

## 2013-06-16 NOTE — Progress Notes (Signed)
.  Psychoeducational Group Note    Date: 06/16/2013 Time:  0930   Goal Setting Purpose of Group: To be able to set a goal that is measurable and that can be accomplished in one day Participation Level:  Active  Participation Quality:  Appropriate  Affect:  Appropriate  Cognitive:  Appropriate  Insight:  Engaged and Improving  Engagement in Group:  Engaged  Additional Comments:  Pt attended the group and partisipated  Daymeon Fischman A  

## 2013-06-16 NOTE — Progress Notes (Signed)
[facility] MD Progress Note  06/16/2013 5:20 PM Leon Mason  MRN:  [id number] Subjective:  Patient states he is feeling much better after attending the groups.  He states his depression is 0/10, and his Anxiety is a 1/10. He reports being very anxious because his girlfriend is coming to visit him. He has "butterflies". Patient  has been compliant with his medication and inpatient psychiatric program including milieu therapy and group therapy.He reports he is getting a lot of group therapy and he is very glad he came to the facility. At first he didn't know how to handle it his emotions, or be a human, but now he is able to express himself and is learning how to communicate better.  Denies SI/HI/AVH. 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  Schizophrenia    .  Anxiety    .  Asthma     AXIS IV: economic problems, housing problems and problems with primary support group  AXIS V: 41-50 serious symptoms  ADL's:  Intact  Sleep: Good  Appetite:  Good  Suicidal Ideation:  decreasing Homicidal Ideation:  denies AEB (as evidenced by):  Psychiatric Specialty Exam: Physical Exam  Constitutional: He is oriented to person, place, and time. He appears well-developed.  HENT:  Head: Normocephalic.  Eyes: Pupils are equal, round, and reactive to light.  Neck: Normal range of motion.  Respiratory: Effort normal.  GI: Soft.  Neurological: He is alert and oriented to person, place, and time.  Skin: Skin is warm.  Psychiatric: He has a normal mood and affect. His behavior is normal. Judgment and thought content normal.    Review of Systems  All other systems reviewed and are negative.    Blood pressure 120/83, pulse 99, temperature 97.4 F (36.3 C), temperature source Oral, resp. rate 18, height 6' (1.829 m), weight 103.42 kg (228 lb), SpO2 97.00%.Body mass index is 30.92 kg/(m^2).  General Appearance: Fairly Groomed  Engineer, water::  Good  Speech:  Clear and Coherent  Volume:  Normal  Mood:  Depressed  Affect:  Congruent  Thought Process:  Goal Directed  Orientation:  Full (Time, Place, and Person)  Thought Content:  Hallucinations: Visual  Suicidal Thoughts:  No  Homicidal Thoughts:  No  Memory:  NA  Judgement:  Poor  Insight:  Shallow  Psychomotor Activity:  Normal  Concentration:  Poor  Recall:  [place] of Knowledge:Fair  Language: Good  Akathisia:  No  Handed:  Right  AIMS (if indicated):     Assets:  Communication Skills Desire for Improvement Physical Health  Sleep:  Number of Hours: 4.75   Musculoskeletal: Strength & Muscle Tone: within normal limits Gait & Station: normal Patient leans: N/A  Current Medications: Current Facility-Administered Medications  Medication Dose Route Frequency Provider Last Rate Last Dose  . acetaminophen (TYLENOL) tablet 650 mg  650 mg Oral Q6H PRN Nena Polio, PA-C      . alum & mag hydroxide-simeth (MAALOX/MYLANTA) 200-200-20 MG/5ML suspension 30 mL  30 mL Oral Q4H PRN Nena Polio, PA-C      . benztropine (COGENTIN) tablet 1 mg  1 mg Oral Daily Nena Polio, PA-C   1 mg at 06/16/13 0759  . diphenhydrAMINE (BENADRYL) capsule  50 mg  50 mg Oral Q6H PRN Nena Polio, PA-C   50 mg at 06/14/13 2146  . magnesium hydroxide (MILK OF MAGNESIA) suspension 30 mL  30 mL Oral Daily PRN Nena Polio, PA-C      . nicotine (NICODERM CQ - dosed in mg/24 hours) patch 14 mg  14 mg Transdermal Daily Nena Polio, PA-C   14 mg at 06/16/13 0801  . risperiDONE (RISPERDAL M-TABS) disintegrating tablet 1 mg  1 mg Oral QHS Nena Polio, PA-C   1 mg at 06/15/13 2135    Lab Results:  No results found for this or any previous visit  (from the past 48 hour(s)).  Physical Findings: AIMS: Facial and Oral Movements Muscles of Facial Expression: None, normal Lips and Perioral Area: None, normal Jaw: None, normal Tongue: None, normal,Extremity Movements Upper (arms, wrists, hands, fingers): None, normal Lower (legs, knees, ankles, toes): None, normal, Trunk Movements Neck, shoulders, hips: None, normal, Overall Severity Severity of abnormal movements (highest score from questions above): None, normal Incapacitation due to abnormal movements: None, normal Patient's awareness of abnormal movements (rate only patient's report): No Awareness, Dental Status Current problems with teeth and/or dentures?: No Does patient usually wear dentures?: No  CIWA:  CIWA-Ar Total: 4 COWS:     Treatment Plan Summary: Daily contact with patient to assess and evaluate symptoms and progress in treatment Medication management  Plan: 1. Admit for crisis management and stabilization. 2. Medication management to reduce current symptoms to base line and improve the patient's overall level of functioning. 3. Treat health problems as indicated. 4. Develop treatment plan to decrease risk of relapse upon discharge and to reduce the need for readmission. 5. Psycho-social education regarding relapse prevention and self care. 6. Health care follow up as needed for medical problems. New: 1. Risperdal 1mg  po at hs is discussed with risks benefits, and side effects with the patient. All questions are answered to the patient's satisfaction. 2. Patient education done regarding the use of psychotropic medications, risks of 2 or more antipsychotics, and his symptoms. 3. Discussed alcohol and substance abuse with the patient regarding his symptoms. 4. Disposition in place. 5. ELOS: 3-4 days anticipate review for discharge on Monday.  Medical Decision Making Problem Points:  Established problem, stable/improving (1) Data Points:  Review of medication  regiment & side effects (2)  I certify that inpatient services furnished can reasonably be expected to improve the patient's condition.   Nanci Pina 06/16/2013, 5:20 PM

## 2013-06-16 NOTE — Progress Notes (Signed)
Patient ID: Leon Mason, male   DOB: 1990-06-22, 23 y.o.   MRN: [id number] D)  Has been out and about on the hall this evening, interacting appropriately with staff and peers, especially those near his age.  Attended group and participated in the discussion.  Came to the med window afterward for hs meds, asked appropriate questions and voiced concerns about stopping his medications suddenly.  Has been cooperative and compliant. A)  Was encouraged to discuss his concerns with MD in am, will continue to monitor q 15 minutes for safety, continue POC R)  Safety maintained.

## 2013-06-16 NOTE — [facility] Group Notes (Signed)
[facility] LCSW Group Therapy  06/16/2013 4:03 PM  Type of Therapy:  Group Therapy  Participation Level:  Active  Participation Quality:  Appropriate, Sharing and Supportive  Affect:  Appropriate  Cognitive:  Alert and Oriented  Insight:  Developing/Improving, Engaged and Supportive  Engagement in Therapy:  Developing/Improving, Engaged and Supportive  Modes of Intervention:  Discussion, Education, Exploration, Rapport Building and Support  Summary of Progress/Problems: Pt was engaged and was able to identify a positive social support and coping skill.  Pt was respectful of other group members and was engaged in discussion.  Katha Hamming 06/16/2013, 4:03 PM

## 2013-06-16 NOTE — Progress Notes (Signed)
Psychoeducational Group Note  Date: 06/16/2013 Time:  1015  Group Topic/Focus:  Identifying Needs:   The focus of this group is to help patients identify their personal needs that have been historically problematic and identify healthy behaviors to address their needs.  Participation Level:  Active  Participation Quality:  Appropriate  Affect:  Appropriate  Cognitive:  Oriented  Insight:  Improving  Engagement in Group:  Engaged  Additional Comments:  Pt attended the group and participated in the discussion  Venancio Chenier A  

## 2013-06-16 NOTE — Progress Notes (Signed)
Adult Psychoeducational Group Note  Date:  06/16/2013 Time:  1:26 AM  Group Topic/Focus:  Wrap-Up Group:   The focus of this group is to help patients review their daily goal of treatment and discuss progress on daily workbooks.  Participation Level:  Active  Participation Quality:  Appropriate  Affect:  Appropriate  Cognitive:  Alert  Insight: Appropriate  Engagement in Group:  Engaged  Modes of Intervention:  Discussion  Additional Comments:  Pt stated that he had a good day. His goal is to continue not let what others think of him affect his decisions.   Sharmon Revere 06/16/2013, 1:26 AM

## 2013-06-16 NOTE — Progress Notes (Signed)
The focus of this group is to help patients review their daily goal of treatment and discuss progress on daily workbooks. Pt attended the evening group session and responded to all discussion prompts from the [place]. Pt shared that today was a good day on the unit, the highlight of which was getting to see his girlfriend. Pt also mentioned having several realizations earlier today including that he shouldn't give others power over him by letting them anger him. Pt did mention that he still felt that people could read his thoughts. Pt reported having no other needs from Nursing Staff this evening. Pt's affect was appropriate.

## 2013-06-17 NOTE — Progress Notes (Signed)
Patient ID: Leon Mason, male   DOB: 1991/02/23, 23 y.o.   MRN: [id number] D)  Has been out on the hall this evening, interacting with select peers, and seems to be in good spirits.  Attended group, stated had a good visit with his girlfriend, appropriate, compliant with meds and program.   A)  Will continue to monitor for safety, continue POC R)  Safety maintained.

## 2013-06-17 NOTE — Progress Notes (Signed)
[facility] MD Progress Note  06/17/2013 11:01 AM Leon Mason  MRN:  [id number] Subjective:  Patient states he is very tired today for some reason, he is observed laying in bed. Although he states his depression is 0/10, and his Anxiety is a 0/10. He enjoyed his visit with his girlfriend. Patient  has been compliant with his medication and inpatient psychiatric program including milieu therapy and group therapy. Denies SI/HI/AVH. Upon discharge he plans to return back home, he plans to follow up with his outpatient psychiatrist and psychologist, appointments have been scheduled for 4/29 and 4/22 consecutively. He is inquiring about why he is no longer on Lithium. Denies any side effects or adverse effects from the medication. He is willing to continue as a patient through [facility] outpatient services, stating "his psychiatrist is practicing bad medicine by having him take 3 anti-psychotic medications".  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  Schizophrenia    .  Anxiety    .  Asthma     AXIS IV: economic problems, housing problems and problems with primary support group  AXIS V: 41-50 serious symptoms  ADL's:  Intact  Sleep: Good  Appetite:  Good  Suicidal Ideation:  decreasing Homicidal Ideation:  denies AEB (as evidenced by):  Psychiatric Specialty Exam: Physical Exam  Constitutional: He is oriented to person, place, and time. He appears well-developed.  HENT:  Head: Normocephalic.  Eyes: Pupils are equal, round, and reactive to light.  Neck: Normal range of motion.  Respiratory: Effort normal.  GI: Soft.  Neurological: He is alert and oriented to person, place, and time.   Skin: Skin is warm.  Psychiatric: He has a normal mood and affect. His behavior is normal. Judgment and thought content normal.    Review of Systems  All other systems reviewed and are negative.   Blood pressure 116/78, pulse 98, temperature 97.6 F (36.4 C), temperature source Oral, resp. rate 16, height 6' (1.829 m), weight 103.42 kg (228 lb), SpO2 97.00%.Body mass index is 30.92 kg/(m^2).  General Appearance: Fairly Groomed  Engineer, water::  Good  Speech:  Clear and Coherent  Volume:  Normal  Mood:  Depressed  Affect:  Congruent  Thought Process:  Goal Directed  Orientation:  Full (Time, Place, and Person)  Thought Content:  Hallucinations: Visual  Suicidal Thoughts:  No  Homicidal Thoughts:  No  Memory:  NA  Judgement:  Poor  Insight:  Shallow  Psychomotor Activity:  Normal  Concentration:  Poor  Recall:  [place] of Knowledge:Fair  Language: Good  Akathisia:  No  Handed:  Right  AIMS (if indicated):     Assets:  Communication Skills Desire for Improvement Physical Health  Sleep:  Number of Hours: 5.75   Musculoskeletal: Strength & Muscle Tone: within normal limits Gait & Station: normal Patient leans: N/A  Current Medications: Current Facility-Administered Medications  Medication Dose Route Frequency Provider Last Rate Last Dose  . acetaminophen (TYLENOL) tablet 650 mg  650 mg Oral Q6H PRN Nena Polio, PA-C      . alum & mag hydroxide-simeth (MAALOX/MYLANTA) 200-200-20 MG/5ML suspension 30 mL  30 mL Oral Q4H PRN Nena Polio, PA-C      . benztropine (COGENTIN) tablet  1 mg  1 mg Oral Daily Nena Polio, PA-C   1 mg at 06/16/13 0759  . diphenhydrAMINE (BENADRYL) capsule 50 mg  50 mg Oral Q6H PRN Nena Polio, PA-C   50 mg at 06/14/13 2146  . magnesium hydroxide (MILK OF MAGNESIA) suspension 30 mL  30 mL Oral Daily PRN Nena Polio, PA-C      . nicotine (NICODERM CQ - dosed in mg/24 hours) patch 14 mg  14 mg Transdermal Daily Nena Polio, PA-C   14 mg at  06/16/13 0801  . risperiDONE (RISPERDAL M-TABS) disintegrating tablet 1 mg  1 mg Oral QHS Nena Polio, PA-C   1 mg at 06/16/13 2142    Lab Results:  No results found for this or any previous visit (from the past 48 hour(s)).  Physical Findings: AIMS: Facial and Oral Movements Muscles of Facial Expression: None, normal Lips and Perioral Area: None, normal Jaw: None, normal Tongue: None, normal,Extremity Movements Upper (arms, wrists, hands, fingers): None, normal Lower (legs, knees, ankles, toes): None, normal, Trunk Movements Neck, shoulders, hips: None, normal, Overall Severity Severity of abnormal movements (highest score from questions above): None, normal Incapacitation due to abnormal movements: None, normal Patient's awareness of abnormal movements (rate only patient's report): No Awareness, Dental Status Current problems with teeth and/or dentures?: No Does patient usually wear dentures?: No  CIWA:  CIWA-Ar Total: 4 COWS:     Treatment Plan Summary: Daily contact with patient to assess and evaluate symptoms and progress in treatment Medication management  Plan: 1. Admit for crisis management and stabilization. 2. Medication management to reduce current symptoms to base line and improve the patient's overall level of functioning. 3. Treat health problems as indicated. 4. Develop treatment plan to decrease risk of relapse upon discharge and to reduce the need for readmission. 5. Psycho-social education regarding relapse prevention and self care. 6. Health care follow up as needed for medical problems. New: 1. Risperdal 1mg  po at hs is discussed with risks benefits, and side effects with the patient. All questions are answered to the patient's satisfaction. 2. Patient education done regarding the use of psychotropic medications, risks of 2 or more antipsychotics, and his symptoms. 3. Discussed alcohol and substance abuse with the patient regarding his symptoms. 4.  Disposition in place. 5. ELOS: 3-4 days anticipate review for discharge on Monday. Schedule follow up appt with [facility] outpatient services.   Medical Decision Making Problem Points:  Established problem, stable/improving (1) Data Points:  Review of medication regiment & side effects (2)  I certify that inpatient services furnished can reasonably be expected to improve the patient's condition.   Gayland Curry Starkes  FNP-BC  06/17/2013, 11:01 AM

## 2013-06-17 NOTE — BHH Group Notes (Signed)
[facility] LCSW Group Therapy No    06/17/2013 1:15PM  Type of Therapy and Topic:  Group Therapy: Stages of Change and Self-Sabotaging, Enabling Behaviors   Participation Level:  Active   Mood: serious  Description of Group:     Learn how to identify obstacles, self-sabotaging and enabling behaviors, what are they, why do we do them and what needs do these behaviors meet? Discuss unhealthy relationships and how to have positive healthy boundaries with those that sabotage and enable. Explore aspects of self-sabotage and enabling in yourself and how to limit these self-destructive behaviors in everyday life.  Therapeutic Goals: 1. Patient will identify one obstacle that relates to self-sabotage and enabling behaviors 2. Patient will identify one personal self-sabotaging or enabling behavior they did prior to admission 3. Patient will demonstrate ability to communicate their needs through discussion and/or role plays. 4. Patient will identify where they are in Stages of Change Diagram   Summary of Patient Progress: The main focus of today's process group was to have patient identify with what "self-sabotage" means to them and use Motivational Interviewing to discuss what benefits, negative or positive, were involved in a self-identified self-sabotaging behavior. We then talked about reasons the patient may want to change the behavior and any current desire to change. Patients then had opportunity to identify where they are on the Stages of Change Cycle diagram discussed in group. [name] was able to identify goal of returning to school this year and share that non compliance and substance use led to his hospitalization.  He shared frustration with group members who were argumentative and advocated for others. [name] was able to ask for and receive clarification on several points.    Therapeutic Modalities:   Cognitive Behavioral Therapy Person-Centered Therapy Motivational Interviewing   Sheilah Pigeon, LCSW

## 2013-06-17 NOTE — Progress Notes (Signed)
Psychoeducational Group Note  Date:  06/17/2013 Time:  1015  Group Topic/Focus:  Making Healthy Choices:   The focus of this group is to help patients identify negative/unhealthy choices they were using prior to admission and identify positive/healthier coping strategies to replace them upon discharge.  Participation Level:  Active  Participation Quality:  Appropriate  Affect:  Flat  Cognitive:  Oriented  Insight:  Improving  Engagement in Group:  Engaged  Additional Comments:  Added a lot to the conversation and listened to others when they shared  Royer Cristobal A 06/17/2013  

## 2013-06-17 NOTE — Progress Notes (Signed)
D) Pt has approached this Probation officer several times with concerns about a friendship that he has had with his best friend. Today Pt states that he is angry with this friend mostly because he feels the friend has made fun of him for years and hasn't respected him. Feels that this has lead him to his depression and blames his friend for some of his issues and problems. Talked with Pt about his diagnosis and that his thoughts and paranoia are due to his chemical imbalance not due to his friend. Affect a little more spontaneous today. A) Pt provided with a 1:1 several times today concerning his feelings about his friend. Given support and reassurance along with praise. R) Pt states taht he still has paranoid thoughts and feelings and at times feels that people are reading his mind.

## 2013-06-17 NOTE — Progress Notes (Signed)
Psychoeducational Group Note  Date: 06/17/2013 Time: 0930  Group Topic/Focus:  Making Healthy Choices:   The focus of this group is to help patients identify negative/unhealthy choices they were using prior to admission and identify positive/healthier coping strategies to replace them upon discharge.  Participation Level:  Active  Participation Quality:  Appropriate  Affect:  Appropriate  Cognitive:  Appropriate  Insight:  Engaged  Engagement in Group:  Engaged  Additional Comments:    06/17/2013,7:46 PM [place]

## 2013-06-18 DIAGNOSIS — F29 Unspecified psychosis not due to a substance or known physiological condition: Secondary | ICD-10-CM

## 2013-06-18 MED ORDER — RISPERIDONE 1 MG PO TBDP: 1.0000 mg | ORAL_TABLET | Freq: Every day | ORAL | Status: DC

## 2013-06-18 MED ORDER — SERTRALINE HCL 100 MG PO TABS: 100.0000 mg | ORAL_TABLET | Freq: Every day | ORAL | Status: DC

## 2013-06-18 MED ORDER — BENZTROPINE MESYLATE 1 MG PO TABS: 1.0000 mg | ORAL_TABLET | Freq: Every day | ORAL | Status: DC

## 2013-06-18 NOTE — Progress Notes (Signed)
[facility] Adult Case Management Discharge Plan :  Will you be returning to the same living situation after discharge: Yes,  Patienti s returning to his home. At discharge, do you have transportation home?:Yes,  Patient to arrange transporation home. Do you have the ability to pay for your medications:Yes,  Patient can afford medications.  Release of information consent forms completed and in the chart;  Patient's signature needed at discharge.  Patient to Follow up at: Follow-up Information   Follow up with Leon Mason - [facility] On 07/04/2013. (Wednesday, July 04, 2013 at 2:20 PM)    Contact information:   [place], [place]   [id number]  [phone]      Follow up with Leon Mason - [facility] On 06/27/2013. (Wednesday, June 27, 2013 at 2:00 PM)    Contact information:   [place], [place]   [address]  [phone]      Patient denies SI/HI:  Patient no longer endorsing SI/HI or other thoughts of self harm.   Safety Planning and Suicide Prevention discussed:.Reviewed with all patients during discharge planning group   [place] 06/18/2013, 1:07 PM

## 2013-06-18 NOTE — Discharge Summary (Signed)
Physician Discharge Summary Note  Patient:  Leon Mason is an 23 y.o., male MRN:  [id number] DOB:  09-Dec-1990 Patient phone:  [phone] (home)  Patient address:   [address]  [place] [address],  Total Time spent with patient: 30 minutes  Date of Admission:  06/13/2013 Date of Discharge: 06/18/2013  Reason for Admission:  Suicidal ideation w psychosis  Discharge Diagnoses: Active Problems:   Psychosis   Psychiatric Specialty Exam:   Please see D/C SRA Physical Exam  ROS  Blood pressure 123/80, pulse 81, temperature 97.3 F (36.3 C), temperature source Oral, resp. rate 18, height 6' (1.829 m), weight 103.42 kg (228 lb), SpO2 97.00%.Body mass index is 30.92 kg/(m^2).  General Appearance:   Eye Contact::    Speech:    Volume:    Mood:    Affect:    Thought Process:    Orientation:    Thought Content:    Suicidal Thoughts:    Homicidal Thoughts:    Memory:    Judgement:    Insight:    Psychomotor Activity:    Concentration:    Recall:    Fund of Knowledge:  Language:   Akathisia:    Handed:    AIMS (if indicated):     Assets:    Sleep:  Number of Hours: 4.5    Past Psychiatric History: Diagnosis:  Hospitalizations:  Outpatient Care:  Substance Abuse Care:  Self-Mutilation:  Suicidal Attempts:  Violent Behaviors:   Musculoskeletal: Strength & Muscle Tone:  Gait & Station:  Patient leans:   DSM5:  Schizophrenia Disorders:   Obsessive-Compulsive Disorders:   Trauma-Stressor Disorders:   Substance/Addictive Disorders:   Depressive Disorders:    Axis Diagnosis:   AXIS I:   AXIS II:   AXIS III:   Past Medical History  Diagnosis Date  . Schizophrenia   . Anxiety   . Asthma    AXIS IV:   AXIS V:    Level of Care:  OP  Mason Course:          Patient is a 23 year old male who presented to the [facility] on the direction of his therapist Leon Mason whom he sees for "schizophreniform disorder." He endorsed being homeless, feeling  that people are reading his mind, and very anxious. He stated that he did not feel safe and was thinking about hurting himself. He had planned to use a noose.         Upon admission to the adult unit Leon Mason was evaluated by a psychiatrist and his symptoms were identified.  Medication management was done by discontinuing many medications that Leon Mason was taking.  He was discontinued from the Leon Mason as he was being tapered off of this. He was also on Haldol at hs, and reported that he was to start Seroquel the same day as well. He was also reporting drinking wine or hard liquor each day feeling that his tolerance had increased over the past two months.          Both the Haldol and Latuda as well as Seroquel were discontinued as it was believed that his psychosis was due to his history of substance abuse and continued use of THC.  He did not show signs or symptoms of alcohol withdrawal or DTs.   Leon Mason was started on Risperdal Mtabs at hs for his psychosis and encouraged to participate in groups.         With in 24 hours his cognition, mood, and affect improved  significantly. He was also placed on low dose Cogentin to prevent any EPS.  Leon Mason reported that he felt much better, could think clearly for the first time in several months.  He denied any further psychosis and reported no new symptoms or side effects.  He was pleased with his progress and soon felt stable enough to be discharged.  He was able to contact his mother to work out the details of his housing.         Leon Mason was also educated in the effects of substance abuse on mental health. He was encouraged to avoid all drugs and alcohol to continue to improve his mental health. He was offered further rehab but declined at this time. Leon Mason was given a list of local AA meetings and rehab facilities in the area.         By the day of discharge he was in much improved condition and was motivated to continue taking his medication and to maintain his  sobriety.  He denied SI/HI or AVH.  He felt that he had learned good coping skills in the groups that he could use on discharge.  He was discharged home with plans to follow up as noted below. Consults:  None  Significant Diagnostic Studies:  None  Discharge Vitals:   Blood pressure 123/80, pulse 81, temperature 97.3 F (36.3 C), temperature source Oral, resp. rate 18, height 6' (1.829 m), weight 103.42 kg (228 lb), SpO2 97.00%. Body mass index is 30.92 kg/(m^2). Lab Results:   No results found for this or any previous visit (from the past 72 hour(s)).  Physical Findings: AIMS: Facial and Oral Movements Muscles of Facial Expression: None, normal Lips and Perioral Area: None, normal Jaw: None, normal Tongue: None, normal,Extremity Movements Upper (arms, wrists, hands, fingers): None, normal Lower (legs, knees, ankles, toes): None, normal, Trunk Movements Neck, shoulders, hips: None, normal, Overall Severity Severity of abnormal movements (highest score from questions above): None, normal Incapacitation due to abnormal movements: None, normal Patient's awareness of abnormal movements (rate only patient's report): No Awareness, Dental Status Current problems with teeth and/or dentures?: No Does patient usually wear dentures?: No  CIWA:  CIWA-Ar Total: 4 COWS:     Psychiatric Specialty Exam: See Psychiatric Specialty Exam and Suicide Risk Assessment completed by Attending Physician prior to discharge.  Discharge destination:  Home  Is patient on multiple antipsychotic therapies at discharge:  No   Has Patient had three or more failed trials of antipsychotic monotherapy by history:  No  Recommended Plan for Multiple Antipsychotic Therapies: NA  Discharge Orders   Future Orders Complete By Expires   Diet - low sodium heart healthy  As directed    Discharge instructions  As directed    Increase activity slowly  As directed        Medication List    STOP taking these  medications       BEE POLLEN PO     haloperidol 2 MG tablet  Commonly known as:  HALDOL     l-methylfolate-B6-B12 3-35-2 MG Tabs  Commonly known as:  METANX     lithium carbonate 300 MG CR tablet  Commonly known as:  LITHOBID     lurasidone 40 MG Tabs tablet  Commonly known as:  LATUDA     VITAMIN-B COMPLEX PO      TAKE these medications     Indication   albuterol 108 (90 BASE) MCG/ACT inhaler  Commonly known as:  PROVENTIL HFA;VENTOLIN HFA  Inhale 3  puffs into the lungs See admin instructions. Every 4 days as needed for asthma flare up      benztropine 1 MG tablet  Commonly known as:  COGENTIN  Take 1 tablet (1 mg total) by mouth daily.   Indication:  Extrapyramidal Reaction caused by Medications     FISH OIL PO  Take 1 capsule by mouth daily.      risperiDONE 1 MG disintegrating tablet  Commonly known as:  RISPERDAL M-TABS  Take 1 tablet (1 mg total) by mouth at bedtime.   Indication:  psychosis related to substance abuse.     sertraline 100 MG tablet  Commonly known as:  ZOLOFT  Take 1 tablet (100 mg total) by mouth at bedtime.   Indication:  Anxiety Disorder, Major Depressive Disorder           Follow-up Information   Follow up with Arn Medal - [facility] On 07/04/2013. (Wednesday, July 04, 2013 at 2:20 PM)    Contact information:   [place], [place]   [address]  [phone]      Follow up with [name] - [facility] On 06/27/2013. (Wednesday, June 27, 2013 at 2:00 PM)    Contact information:   [place], [place]   [address]  [phone]      Follow-up recommendations:   Activities: Resume activity as tolerated. Diet: Heart healthy low sodium diet Tests: Follow up testing will be determined by your out patient provider. Comments:    Total Discharge Time:  Less than 30 minutes.  Signed: Nena Polio 06/18/2013, 1:30 PM  Patient was seen face to face for psych evaluation,  completed discharge suicide risk assessment and case discussed with treatment team and physician extender and made appropriate disposition plans. Reviewed the information documented and agree with the treatment plan.  Parke Simmers Cherrie Franca 06/22/2013 12:48 PM

## 2013-06-18 NOTE — BHH Group Notes (Signed)
[facility] LCSW Aftercare Discharge Planning Group Note   06/18/2013 1:07 PM    Participation Quality:  Appropraite  Mood/Affect:  Appropriate  Depression Rating:  1  Anxiety Rating:  1  Thoughts of Suicide:  No  Will you contract for safety?   NA  Current AVH:  No  Plan for Discharge/Comments:  Patient attended discharge planning group and actively participated in group.  He will follow up with [facility].  CSW provided all participants with daily workbook.   Transportation Means: Patient has transportation.   Supports:  Patient has a support system.   Kortez Murtagh, Eulas Post

## 2013-06-18 NOTE — Tx Team (Signed)
Interdisciplinary Treatment Plan Update   Date Reviewed:  06/18/2013  Time Reviewed:  10:21 AM  Progress in Treatment:   Attending groups: Yes Participating in groups: Yes Taking medication as prescribed: Yes  Tolerating medication: Yes Family/Significant other contact made:  Yes, collateral contact with mother. Patient understands diagnosis: Yes  Discussing patient identified problems/goals with staff: Yes Medical problems stabilized or resolved: Yes Denies suicidal/homicidal ideation: Yes Patient has not harmed self or others: Yes  For review of initial/current patient goals, please see plan of care.  Estimated Length of Stay:  Discharge today  Reasons for Continued Hospitalization:   New Problems/Goals identified:    Discharge Plan or Barriers:   Home with outpatient follow up [facility]  Additional Comments:  Attendees:  Patient: Leon Mason 06/18/2013 10:21 AM   Signature: Mylinda Latina, MD 06/18/2013 10:21 AM  Signature:  Nena Polio, PA 06/18/2013 10:21 AM  Signature: Drake Leach, RN 06/18/2013 10:21 AM  Signature: Thurnell Garbe, RN 06/18/2013 10:21 AM  Signature:  Grayland Ormond, RN 06/18/2013 10:21 AM  Signature:  Joette Catching, LCSW 06/18/2013 10:21 AM  Signature:  06/18/2013 10:21 AM  Signature:  Lucinda Dell, Care Coordinator [facility] 06/18/2013 10:21 AM  Signature:   06/18/2013 10:21 AM  Signature:  06/18/2013  10:21 AM  Signature:   Lars Pinks, RN [facility] 06/18/2013  10:21 AM  Signature:  06/18/2013  10:21 AM    Scribe for Treatment Team:   Joette Catching,  06/18/2013 10:21 AM

## 2013-06-18 NOTE — Progress Notes (Signed)
Patient ID: Leon Mason, male   DOB: 1990/03/13, 23 y.o.   MRN: [id number] Patient is discharged ambulatory to ride home with his mother.  He will be living with roommates and plans to go back to work and school.  He denies SI/HI.  He verbalizes understanding of his discharge meds and followup. He was given scripts and a small supply of meds. He  Was appreciative of care received.

## 2013-06-18 NOTE — Progress Notes (Signed)
D) Pt's mood and affect are appropriate. Rates his depression and hopelessness at a 0. States that he has learned a lot and would like to leave. Feels that he is ready to leave A) Given support, reassurance and praise. Encouragement given. R) Denies SI and HI.

## 2013-06-18 NOTE — Progress Notes (Signed)
Patient ID: Leon Mason, male   DOB: [birth year], 23 y.o.   MRN: [id number] D)  Has been out and about on the hall this evening, attended group and expressed his appreciation for the support he had received from peers.  Stated he wouldn't let others' thoughts of him keep him from being successful in life.  Is preoccupied at times, but has been compliant with meds and has again voiced that he would like to discuss coming off his meds suddenly with his MD in am.  A)  Will continue to monitor for safety, continue POC R)  Safety maintained.

## 2013-06-18 NOTE — [facility] Suicide Risk Assessment (Signed)
   Demographic Factors:  Male, Caucasian, Low socioeconomic status and Unemployed  Total Time spent with patient: 30 minutes  Psychiatric Specialty Exam: Physical Exam  ROS  Blood pressure 123/80, pulse 81, temperature 97.3 F (36.3 C), temperature source Oral, resp. rate 18, height 6' (1.829 m), weight 103.42 kg (228 lb), SpO2 97.00%.Body mass index is 30.92 kg/(m^2).  General Appearance: Casual and Fairly Groomed  Engineer, water::  Good  Speech:  Clear and Coherent  Volume:  Normal  Mood:  Euthymic  Affect:  Congruent and Constricted  Thought Process:  Coherent and Goal Directed  Orientation:  Full (Time, Place, and Person)  Thought Content:  WDL  Suicidal Thoughts:  No  Homicidal Thoughts:  No  Memory:  Immediate;   Good  Judgement:  Good  Insight:  Good  Psychomotor Activity:  Normal  Concentration:  Good  Recall:  Good  Fund of Knowledge:Good  Language: Good  Akathisia:  NA  Handed:  Right  AIMS (if indicated):     Assets:  Communication Skills Desire for Improvement Financial Resources/Insurance Housing Leisure Time War Talents/Skills Transportation  Sleep:  Number of Hours: 4.5    Musculoskeletal: Strength & Muscle Tone: within normal limits Gait & Station: normal Patient leans: N/A   Mental Status Per Nursing Assessment::   On Admission:  Self-harm thoughts  Current Mental Status by Physician: NA  Loss Factors: Financial problems/change in socioeconomic status  Historical Factors: Family history of mental illness or substance abuse  Risk Reduction Factors:   Sense of responsibility to family, Religious beliefs about death, Employed, Living with another person, especially a relative, Positive social support, Positive therapeutic relationship and Positive coping skills or problem solving skills  Continued Clinical Symptoms:  Bipolar Disorder:   Mixed State Previous Psychiatric Diagnoses and  Treatments Medical Diagnoses and Treatments/Surgeries  Cognitive Features That Contribute To Risk:  Polarized thinking    Suicide Risk:  Minimal: No identifiable suicidal ideation.  Patients presenting with no risk factors but with morbid ruminations; may be classified as minimal risk based on the severity of the depressive symptoms  Discharge Diagnoses:   AXIS I:  Substance Induced Mood Disorder and polysubstance abuse AXIS II:  Deferred AXIS III:   Past Medical History  Diagnosis Date  . Schizophrenia   . Anxiety   . Asthma    AXIS IV:  other psychosocial or environmental problems, problems related to social environment and problems with primary support group AXIS V:  61-70 mild symptoms  Plan Of Care/Follow-up recommendations:  Activity:  As tolerated Diet:  Regular  Is patient on multiple antipsychotic therapies at discharge:  No   Has Patient had three or more failed trials of antipsychotic monotherapy by history:  No  Recommended Plan for Multiple Antipsychotic Therapies: NA    Janardhaha R Christeen Lai 06/18/2013, 1:21 PM

## 2013-06-20 NOTE — Progress Notes (Addendum)
Patient Discharge Instructions:  After Visit Summary (AVS):   Faxed to:  06/20/13 Psychiatric Admission Assessment Note:   Faxed to:  06/20/13 Discharge Summary Note:   Faxed to:  07/04/13 Suicide Risk Assessment - Discharge Assessment:   Faxed to:  06/20/13 Faxed/Sent to the Next Level Care provider:  06/20/13 Faxed to [facility], 06/20/2013, 3:47 PM

## 2014-12-25 ENCOUNTER — Encounter (HOSPITAL_COMMUNITY): Payer: Self-pay | Admitting: Neurology

## 2014-12-25 ENCOUNTER — Emergency Department (HOSPITAL_COMMUNITY)
Admission: EM | Admit: 2014-12-25 | Discharge: 2014-12-25 | Disposition: A | Discharge status: Home / Self Care | Payer: 59 | Attending: Emergency Medicine | Admitting: Emergency Medicine

## 2014-12-25 ENCOUNTER — Emergency Department (HOSPITAL_COMMUNITY): Payer: 59

## 2014-12-25 DIAGNOSIS — F419 Anxiety disorder, unspecified: Secondary | ICD-10-CM | POA: Diagnosis not present

## 2014-12-25 DIAGNOSIS — Z720 Tobacco use: Secondary | ICD-10-CM | POA: Insufficient documentation

## 2014-12-25 DIAGNOSIS — J45909 Unspecified asthma, uncomplicated: Secondary | ICD-10-CM | POA: Diagnosis not present

## 2014-12-25 DIAGNOSIS — R091 Pleurisy: Secondary | ICD-10-CM | POA: Insufficient documentation

## 2014-12-25 DIAGNOSIS — F209 Schizophrenia, unspecified: Secondary | ICD-10-CM | POA: Diagnosis not present

## 2014-12-25 DIAGNOSIS — Z79899 Other long term (current) drug therapy: Secondary | ICD-10-CM | POA: Diagnosis not present

## 2014-12-25 DIAGNOSIS — R079 Chest pain, unspecified: Secondary | ICD-10-CM | POA: Diagnosis present

## 2014-12-25 LAB — I-STAT TROPONIN, ED: Troponin i, poc: 0 ng/mL (ref 0.00–0.08)

## 2014-12-25 LAB — CBC
HCT: 45.3 % (ref 39.0–52.0)
Hemoglobin: 15.8 g/dL (ref 13.0–17.0)
MCH: 30.3 pg (ref 26.0–34.0)
MCHC: 34.9 g/dL (ref 30.0–36.0)
MCV: 86.8 fL (ref 78.0–100.0)
Platelets: 170 10*3/uL (ref 150–400)
RBC: 5.22 MIL/uL (ref 4.22–5.81)
RDW: 13.5 % (ref 11.5–15.5)
WBC: 6.5 10*3/uL (ref 4.0–10.5)

## 2014-12-25 LAB — BASIC METABOLIC PANEL
Anion gap: 9 (ref 5–15)
BUN: 14 mg/dL (ref 6–20)
CHLORIDE: 104 mmol/L (ref 101–111)
CO2: 27 mmol/L (ref 22–32)
Calcium: 10 mg/dL (ref 8.9–10.3)
Creatinine, Ser: 0.97 mg/dL (ref 0.61–1.24)
Glucose, Bld: 94 mg/dL (ref 65–99)
POTASSIUM: 4.6 mmol/L (ref 3.5–5.1)
SODIUM: 140 mmol/L (ref 135–145)

## 2014-12-25 MED ORDER — NAPROXEN 500 MG PO TABS: 500.0000 mg | ORAL_TABLET | Freq: Two times a day (BID) | ORAL | Status: DC

## 2014-12-25 NOTE — ED Provider Notes (Addendum)
CSN: [id number]     Arrival date & time 12/25/14  1445 History   First MD Initiated Contact with Patient 12/25/14 1656     Chief Complaint  Patient presents with  . Chest Pain     (Consider location/radiation/quality/duration/timing/severity/associated sxs/prior Treatment) Patient is a 24 y.o. male presenting with chest pain. The history is provided by the patient.  Chest Pain Pain location:  L chest Pain quality: sharp and shooting   Pain radiates to:  Does not radiate Pain radiates to the back: no   Pain severity:  Severe Onset quality:  Gradual Duration:  24 hours Timing:  Constant Progression:  Worsening Chronicity:  New Context: breathing   Relieved by:  None tried Worsened by:  Deep breathing, coughing and movement Ineffective treatments:  None tried Associated symptoms: cough   Associated symptoms: no abdominal pain, no anorexia, no fever, no heartburn, no lower extremity edema, no palpitations and no shortness of breath   Associated symptoms comment:  States last week had URI and lots of coughing but 2 days ago the coughing pretty much stopped Risk factors comment:  Hx of asthma and for a week was smoking heavily but has since stopped   Past Medical History  Diagnosis Date  . Schizophrenia ([place])   . Anxiety   . Asthma    History reviewed. No pertinent past surgical history. No family history on file. Social History  Substance Use Topics  . Smoking status: Light Tobacco Smoker -- 0.25 packs/day for .5 years  . Smokeless tobacco: None  . Alcohol Use: Yes     Comment: 4/5s brandy per week in the last week/ two weeks ago was drinking 8-10 beers daily    Review of Systems  Constitutional: Negative for fever.  Respiratory: Positive for cough. Negative for shortness of breath.   Cardiovascular: Positive for chest pain. Negative for palpitations.  Gastrointestinal: Negative for heartburn, abdominal pain and anorexia.  All other systems reviewed and are  negative.     Allergies  Review of patient's allergies indicates no known allergies.  Home Medications   Prior to Admission medications   Medication Sig Start Date End Date Taking? Authorizing Provider  albuterol (PROVENTIL HFA;VENTOLIN HFA) 108 (90 BASE) MCG/ACT inhaler Inhale 3 puffs into the lungs See admin instructions. Every 4 days as needed for asthma flare up    Historical Provider, MD  benztropine (COGENTIN) 1 MG tablet Take 1 tablet (1 mg total) by mouth daily. 06/18/13   Ruben Im, PA-C  Omega-3 Fatty Acids (FISH OIL PO) Take 1 capsule by mouth daily.    Historical Provider, MD  risperiDONE (RISPERDAL M-TABS) 1 MG disintegrating tablet Take 1 tablet (1 mg total) by mouth at bedtime. 06/18/13   Marlane Hatcher Mashburn, PA-C   BP 130/79 mmHg  Pulse 92  Temp(Src) 97.9 F (36.6 C) (Oral)  Resp 18  SpO2 95% Physical Exam  Constitutional: He is oriented to person, place, and time. He appears well-developed and well-nourished. No distress.  HENT:  Head: Normocephalic and atraumatic.  Mouth/Throat: Oropharynx is clear and moist.  Eyes: Conjunctivae and EOM are normal. Pupils are equal, round, and reactive to light.  Neck: Normal range of motion. Neck supple.  Cardiovascular: Normal rate, regular rhythm and intact distal pulses.   No murmur heard. Pulmonary/Chest: Effort normal and breath sounds normal. No respiratory distress. He has no wheezes. He has no rales. He exhibits tenderness. He exhibits no crepitus.    Abdominal: Soft. He exhibits no distension. There  is no tenderness. There is no rebound and no guarding.  Musculoskeletal: Normal range of motion. He exhibits no edema or tenderness.  Neurological: He is alert and oriented to person, place, and time.  Skin: Skin is warm and dry. No rash noted. No erythema.  Psychiatric: He has a normal mood and affect. His behavior is normal.  Nursing note and vitals reviewed.   ED Course  Procedures (including critical care  time) Labs Review Labs Reviewed  BASIC METABOLIC PANEL  CBC  I-STAT [place], ED    Imaging Review Dg Chest 2 View  12/25/2014  CLINICAL DATA:  Sharp chest pain, left-sided pain EXAM: CHEST  2 VIEW COMPARISON:  05/09/2013 FINDINGS: The heart size and mediastinal contours are within normal limits. Both lungs are clear. The visualized skeletal structures are unremarkable. IMPRESSION: No active cardiopulmonary disease. Electronically Signed   By: Kathreen Devoid   On: 12/25/2014 15:57   I have personally reviewed and evaluated these images and lab results as part of my medical decision-making.   EKG Interpretation   Date/Time:  Wednesday December 25 2014 14:49:57 EDT Ventricular Rate:  90 PR Interval:  160 QRS Duration: 96 QT Interval:  344 QTC Calculation: 420 R Axis:   65 Text Interpretation:  Normal sinus rhythm Incomplete right bundle branch  block No significant change since last tracing Confirmed by Maryan Rued  MD,  Loree Fee ([id number]) on 12/25/2014 4:57:19 PM      MDM   Final diagnoses:  Pleurisy    Pt with atypical story for CP that is pleuritic and sounds most like pleurisy that occurred after recent URI.  Heart score of 0 and no risk factors.  PERC neg.  EKG unchanged.  CXR, CBC, BMP, trop done in triage all wnl.  Low suspicion for PE, dissection, infection, pneumothorax, GI issues or MI. Will treat with anti-inflammatories and patient will use his inhaler as needed     Blanchie Dessert, MD 12/25/14 1722  Blanchie Dessert, MD 12/25/14 1723

## 2014-12-25 NOTE — Discharge Instructions (Signed)
Pleurisy  Pleurisy is an inflammation and swelling of the lining of the lungs (pleura). Because of this inflammation, it hurts to breathe. It can be aggravated by coughing, laughing, or deep breathing. Pleurisy is often caused by an underlying infection or disease.   HOME CARE INSTRUCTIONS   Monitor your pleurisy for any changes. The following actions may help to alleviate any discomfort you are experiencing:  · Medicine may help with pain. Only take over-the-counter or prescription medicines for pain, discomfort, or fever as directed by your health care provider.  · Only take antibiotic medicine as directed. Make sure to finish it even if you start to feel better.  SEEK MEDICAL CARE IF:   · Your pain is not controlled with medicine or is increasing.  · You have an increase in pus-like (purulent) secretions brought up with coughing.  SEEK IMMEDIATE MEDICAL CARE IF:   · You have blue or dark lips, fingernails, or toenails.  · You are coughing up blood.  · You have increased difficulty breathing.  · You have continuing pain unrelieved by medicine or pain lasting more than 1 week.  · You have pain that radiates into your neck, arms, or jaw.  · You develop increased shortness of breath or wheezing.  · You develop a fever, rash, vomiting, fainting, or other serious symptoms.  MAKE SURE YOU:  · Understand these instructions.    · Will watch your condition.    · Will get help right away if you are not doing well or get worse.        This information is not intended to replace advice given to you by your health care provider. Make sure you discuss any questions you have with your health care provider.     Document Released: 02/22/2005 Document Revised: 10/25/2012 Document Reviewed: 08/06/2012  Elsevier Interactive Patient Education ©2016 [employer]

## 2014-12-25 NOTE — ED Notes (Signed)
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 Pain in constant and worsens when taking a deep breath. Has hx of same with anxiety. Denies n/v/d. Is a x 4

## 2015-03-14 DIAGNOSIS — D2222 Melanocytic nevi of left ear and external auricular canal: Secondary | ICD-10-CM | POA: Diagnosis not present

## 2015-03-14 DIAGNOSIS — L700 Acne vulgaris: Secondary | ICD-10-CM | POA: Diagnosis not present

## 2015-03-28 DIAGNOSIS — G4733 Obstructive sleep apnea (adult) (pediatric): Secondary | ICD-10-CM | POA: Diagnosis not present

## 2015-04-28 ENCOUNTER — Ambulatory Visit (HOSPITAL_BASED_OUTPATIENT_CLINIC_OR_DEPARTMENT_OTHER): Payer: 59 | Attending: Family Medicine

## 2015-05-02 DIAGNOSIS — S161XXA Strain of muscle, fascia and tendon at neck level, initial encounter: Secondary | ICD-10-CM | POA: Diagnosis not present

## 2015-05-22 MED FILL — PERPHENAZINE 8 MG TABLET: 8 | 30 days supply | Qty: 30 | Fill #0

## 2015-05-22 MED FILL — PERPHENAZINE 4 MG TABLET: 4 | 1 days supply | Qty: 2 | Fill #0

## 2015-05-23 MED FILL — SERTRALINE HCL 100 MG TAB: 100 | 90 days supply | Qty: 90 | Fill #0

## 2015-06-02 DIAGNOSIS — F3342 Major depressive disorder, recurrent, in full remission: Secondary | ICD-10-CM | POA: Diagnosis not present

## 2015-06-04 DIAGNOSIS — B349 Viral infection, unspecified: Secondary | ICD-10-CM | POA: Diagnosis not present

## 2015-06-16 MED FILL — BENZTROPINE MES 1 MG TABLET: 1 | 90 days supply | Qty: 180 | Fill #0

## 2015-06-30 DIAGNOSIS — F3342 Major depressive disorder, recurrent, in full remission: Secondary | ICD-10-CM | POA: Diagnosis not present

## 2015-07-02 ENCOUNTER — Ambulatory Visit (HOSPITAL_BASED_OUTPATIENT_CLINIC_OR_DEPARTMENT_OTHER): Payer: 59 | Attending: Family Medicine | Admitting: Internal Medicine

## 2015-07-03 MED FILL — PERPHENAZINE 8 MG TABLET: 8 | 28 days supply | Qty: 28 | Fill #0

## 2015-07-28 MED FILL — PERPHENAZINE 8 MG TABLET: 8 | 28 days supply | Qty: 28 | Fill #0

## 2015-08-07 DIAGNOSIS — F3342 Major depressive disorder, recurrent, in full remission: Secondary | ICD-10-CM | POA: Diagnosis not present

## 2015-09-02 ENCOUNTER — Encounter (HOSPITAL_COMMUNITY): Payer: Self-pay | Admitting: Emergency Medicine

## 2015-09-02 ENCOUNTER — Emergency Department (HOSPITAL_COMMUNITY)
Admission: EM | Admit: 2015-09-02 | Discharge: 2015-09-02 | Disposition: A | Discharge status: Home / Self Care | Payer: 59 | Attending: Emergency Medicine | Admitting: Emergency Medicine

## 2015-09-02 DIAGNOSIS — F1020 Alcohol dependence, uncomplicated: Secondary | ICD-10-CM | POA: Diagnosis not present

## 2015-09-02 DIAGNOSIS — Z566 Other physical and mental strain related to work: Secondary | ICD-10-CM

## 2015-09-02 DIAGNOSIS — R45851 Suicidal ideations: Secondary | ICD-10-CM | POA: Diagnosis not present

## 2015-09-02 DIAGNOSIS — Z563 Stressful work schedule: Secondary | ICD-10-CM | POA: Diagnosis not present

## 2015-09-02 DIAGNOSIS — Z720 Tobacco use: Secondary | ICD-10-CM

## 2015-09-02 DIAGNOSIS — F1099 Alcohol use, unspecified with unspecified alcohol-induced disorder: Secondary | ICD-10-CM | POA: Insufficient documentation

## 2015-09-02 DIAGNOSIS — F419 Anxiety disorder, unspecified: Secondary | ICD-10-CM | POA: Diagnosis not present

## 2015-09-02 DIAGNOSIS — F17200 Nicotine dependence, unspecified, uncomplicated: Secondary | ICD-10-CM | POA: Insufficient documentation

## 2015-09-02 DIAGNOSIS — Z7289 Other problems related to lifestyle: Secondary | ICD-10-CM

## 2015-09-02 DIAGNOSIS — Z789 Other specified health status: Secondary | ICD-10-CM

## 2015-09-02 DIAGNOSIS — Z79899 Other long term (current) drug therapy: Secondary | ICD-10-CM | POA: Diagnosis not present

## 2015-09-02 DIAGNOSIS — F439 Reaction to severe stress, unspecified: Secondary | ICD-10-CM | POA: Diagnosis present

## 2015-09-02 DIAGNOSIS — Z8659 Personal history of other mental and behavioral disorders: Secondary | ICD-10-CM | POA: Diagnosis not present

## 2015-09-02 DIAGNOSIS — F17210 Nicotine dependence, cigarettes, uncomplicated: Secondary | ICD-10-CM | POA: Diagnosis not present

## 2015-09-02 DIAGNOSIS — J45909 Unspecified asthma, uncomplicated: Secondary | ICD-10-CM | POA: Diagnosis not present

## 2015-09-02 NOTE — ED Notes (Signed)
Pt states that he has schizophrenia and had a 'mental breakdown' at work today as he became over stressed. States that he wants a blood test to see if his medication levels are therapeutic or if they need to be changed. States that he is not SI/HI and does not want to be admitted to [facility]. He also admits to being an alcoholic and realizes that this does not help his medications. Also admits that he quit smoking 2-3 days ago as well. Alert and oriented.

## 2015-09-02 NOTE — ED Provider Notes (Signed)
CSN: [id number]     Arrival date & time 09/02/15  1819 History   First MD Initiated Contact with Patient 09/02/15 1847     Chief Complaint  Patient presents with  . Mental Health Problem     (Consider location/radiation/quality/duration/timing/severity/associated sxs/prior Treatment) HPI Comments: Leon Mason is a 25 y.o. male with a PMHx of anxiety, schizophrenia, and asthma, who presents to the ED with complaints of having "mental breakdown" at work today. Patient states he feels very stressed at work and his anxiety worsened, and he began crying while at work and had to leave. He admits to being an alcoholic, drinking two [id number] beers last night, which he states he knows doesn't help his mental status. He states that when he feels very anxious and stressed, he occasionally has suicidal thoughts which are passive, has no active suicidal thoughts at this time, no plan, and has never attempted suicide in the past. He denies HI/AVH. Denies illicit drug use. Admits to being a cigarette smoker, states he quit several days ago. Called his psychiatrist office and has appointment tomorrow morning with them. He came here today because he wanted a blood level for his medications to see if they were therapeutic. He takes Cogentin, Zoloft, and perphenazine regularly, last dose was last night around 1am before bed. He reports that he has been hospitalized here once and had a very bad encounter, and does not want be hospitalized here today. He does not feel that he would harm himself if he left here today, he has never made any suicidal attempts and wants to see his therapist in the morning to help with his medications. He is here voluntarily. He also denies any other associated symptoms or medical complaints at this time.  Patient is a 25 y.o. male presenting with mental health disorder. The history is provided by the patient and medical records. No language interpreter was used.  Mental Health  Problem Presenting symptoms: suicidal thoughts (passive, no active SI, no plan)   Presenting symptoms: no hallucinations, no homicidal ideas, no suicidal threats and no suicide attempt   Onset quality:  Unable to specify Timing:  Sporadic Progression:  Unchanged Chronicity:  Recurrent Context: alcohol use and stressful life event   Context: not noncompliant   Treatment compliance:  All of the time Time since last psychoactive medication taken:  1 day Relieved by:  None tried Worsened by:  Nothing tried Ineffective treatments:  None tried Associated symptoms: anxiety   Associated symptoms: no abdominal pain and no chest pain   Risk factors: hx of mental illness   Risk factors: no hx of suicide attempts and no recent psychiatric admission     Past Medical History  Diagnosis Date  . Schizophrenia ([place])   . Anxiety   . Asthma    History reviewed. No pertinent past surgical history. History reviewed. No pertinent family history. Social History  Substance Use Topics  . Smoking status: Light Tobacco Smoker -- 0.25 packs/day for .5 years  . Smokeless tobacco: None  . Alcohol Use: Yes     Comment: 4/5s brandy per week in the last week/ two weeks ago was drinking 8-10 beers daily    Review of Systems  Constitutional: Negative for fever and chills.  Respiratory: Negative for shortness of breath.   Cardiovascular: Negative for chest pain.  Gastrointestinal: Negative for nausea, vomiting, abdominal pain, diarrhea and constipation.  Genitourinary: Negative for dysuria and hematuria.  Musculoskeletal: Negative for myalgias and arthralgias.  Skin: Negative for  color change.  Allergic/Immunologic: Negative for immunocompromised state.  Neurological: Negative for weakness and numbness.  Psychiatric/Behavioral: Positive for suicidal ideas (passive, no active SI, no plan). Negative for homicidal ideas, hallucinations and confusion. The patient is nervous/anxious.    10 Systems reviewed  and are negative for acute change except as noted in the HPI.    Allergies  Other  Home Medications   Prior to Admission medications   Medication Sig Start Date End Date Taking? Authorizing Provider  acetaminophen (TYLENOL) 500 MG tablet Take 1,000 mg by mouth every 6 (six) hours as needed for moderate pain.   Yes Historical Provider, MD  albuterol (PROVENTIL HFA;VENTOLIN HFA) 108 (90 BASE) MCG/ACT inhaler Inhale 3 puffs into the lungs every 4 (four) hours as needed for wheezing or shortness of breath.    Yes Historical Provider, MD  benztropine (COGENTIN) 1 MG tablet Take 1 tablet (1 mg total) by mouth daily. Patient taking differently: Take 2 mg by mouth at bedtime.  06/18/13  Yes Milta Deiters T Mashburn, PA-C  Chlorpheniramine Maleate (ALLERGY PO) Take 1 tablet by mouth 2 (two) times daily as needed (allergies).   Yes Historical Provider, MD  Cyanocobalamin (VITAMIN B-12 PO) Take 1 tablet by mouth daily.   Yes Historical Provider, MD  dextromethorphan-guaiFENesin (MUCINEX DM) 30-600 MG 12hr tablet Take 1 tablet by mouth 2 (two) times daily as needed for cough.   Yes Historical Provider, MD  Multiple Vitamins-Minerals (HM MULTIVITAMIN ADULT GUMMY PO) Take 1 tablet by mouth daily.   Yes Historical Provider, MD  Omega-3 Fatty Acids (FISH OIL PO) Take 1 capsule by mouth daily.   Yes Historical Provider, MD  perphenazine (TRILAFON) 8 MG tablet Take 8 mg by mouth at bedtime.  07/28/15  Yes Historical Provider, MD  Pseudoephedrine-APAP-DM (DAYQUIL PO) Take 30 mLs by mouth every 3 (three) hours as needed (cold symptoms).   Yes Historical Provider, MD  sertraline (ZOLOFT) 100 MG tablet Take 100 mg by mouth at bedtime.  12/08/14  Yes Historical Provider, MD  naproxen (NAPROSYN) 500 MG tablet Take 1 tablet (500 mg total) by mouth 2 (two) times daily with a meal. Patient not taking: Reported on 09/02/2015 12/25/14   Blanchie Dessert, MD  risperiDONE (RISPERDAL M-TABS) 1 MG disintegrating tablet Take 1 tablet (1  mg total) by mouth at bedtime. Patient not taking: Reported on 09/02/2015 06/18/13   Milta Deiters T Mashburn, PA-C   BP 149/105 mmHg  Pulse 97  Temp(Src) 98.6 F (37 C) (Oral)  Resp 18  SpO2 96% Physical Exam  Constitutional: He is oriented to person, place, and time. Vital signs are normal. He appears well-developed and well-nourished.  Non-toxic appearance. No distress.  Afebrile, nontoxic, NAD  HENT:  Head: Normocephalic and atraumatic.  Mouth/Throat: Oropharynx is clear and moist and mucous membranes are normal.  Eyes: Conjunctivae and EOM are normal. Right eye exhibits no discharge. Left eye exhibits no discharge.  Neck: Normal range of motion. Neck supple.  Cardiovascular: Normal rate, regular rhythm, normal heart sounds and intact distal pulses.  Exam reveals no gallop and no friction rub.   No murmur heard. Pulmonary/Chest: Effort normal. No respiratory distress. He has no decreased breath sounds. He has wheezes. He has no rhonchi. He has no rales.  Slight expiratory wheezing noted in all lung fields, otherwise clear lung exam, no rhonchi/rales, SpO2 96% on RA  Abdominal: Soft. Normal appearance and bowel sounds are normal. He exhibits no distension. There is no tenderness. There is no rigidity, no rebound, no  guarding, no CVA tenderness, no tenderness at McBurney's point and negative Murphy's sign.  Musculoskeletal: Normal range of motion.  Neurological: He is alert and oriented to person, place, and time. He has normal strength. No sensory deficit.  Skin: Skin is warm, dry and intact. No rash noted.  Psychiatric: His speech is normal. His mood appears anxious. He is not actively hallucinating. He expresses suicidal ideation. He expresses no homicidal ideation. He expresses no suicidal plans and no homicidal plans.  Slightly anxious affect, very pleasant and cooperative, endorses passive SI without plan, no active SI, denies HI/AVH CONTRACTS FOR SAFETY  Nursing note and vitals  reviewed.   ED Course  Procedures (including critical care time) Labs Review Labs Reviewed - No data to display  Imaging Review No results found. I have personally reviewed and evaluated these images and lab results as part of my medical decision-making.   EKG Interpretation None      MDM   Final diagnoses:  Stress at work  Anxiety  Hx of schizophrenia  Suicidal thoughts  Alcohol use ([place])  Tobacco use    26 y.o. male here with increased stress/anxiety, feels like he had a mental breakdown at work. States sometimes he has suicidal thoughts but has no intent or plan, has never had suicide attempt, and even has an appt tomorrow with his psych dr. [employer] here because he thought there was a blood test to see if his meds were therapeutic. Cogentin, zoloft, and perphenazine are his psych meds, discussed that there is not a level we can do here in the ER. Discussed case with [facility] to contract him for safety, which [place] did before he left. I do not feel he needs to be IVC'd, he's very cooperative and reasonable, and contracts for safety, and has an appt in the morning with his psychiatrist. Discussed at length reasons to return, including worsening suicidal thoughts, etc. F/up with his psychiatrist in the morning. Smoking and alcohol cessation encouraged. I explained the diagnosis and have given explicit precautions to return to the ER including for any other new or worsening symptoms. The patient understands and accepts the medical plan as it's been dictated and I have answered their questions. Discharge instructions concerning home care and prescriptions have been given. The patient is STABLE and is discharged to home in good condition.   BP 149/105 mmHg  Pulse 97  Temp(Src) 98.6 F (37 C) (Oral)  Resp 18  SpO2 96%     Uzoma Vivona Camprubi-Soms, PA-C 09/02/15 1937  Blanchie Dessert, MD 09/03/15 1320

## 2015-09-02 NOTE — Discharge Instructions (Signed)
Follow up with your psychiatrist in the morning at your already scheduled appointment. Stop drinking alcohol and stop smoking. Take your home medications as directed. Avoid any stress triggers. Return to the ER for changes or worsening symptoms, including worsening suicidal thoughts.   Generalized Anxiety Disorder Generalized anxiety disorder (GAD) is a mental disorder. It interferes with life functions, including relationships, work, and school. GAD is different from normal anxiety, which everyone experiences at some point in their lives in response to specific life events and activities. Normal anxiety actually helps [place] prepare for and get through these life events and activities. Normal anxiety goes away after the event or activity is over.  GAD causes anxiety that is not necessarily related to specific events or activities. It also causes excess anxiety in proportion to specific events or activities. The anxiety associated with GAD is also difficult to control. GAD can vary from mild to severe. People with severe GAD can have intense waves of anxiety with physical symptoms (panic attacks).  SYMPTOMS The anxiety and worry associated with GAD are difficult to control. This anxiety and worry are related to many life events and activities and also occur more days than not for 6 months or longer. People with GAD also have three or more of the following symptoms (one or more in children):  Restlessness.   Fatigue.  Difficulty concentrating.   Irritability.  Muscle tension.  Difficulty sleeping or unsatisfying sleep. DIAGNOSIS GAD is diagnosed through an assessment by your health care provider. Your health care provider will ask you questions aboutyour mood,physical symptoms, and events in your life. Your health care provider may ask you about your medical history and use of alcohol or drugs, including prescription medicines. Your health care provider may also do a physical exam and blood  tests. Certain medical conditions and the use of certain substances can cause symptoms similar to those associated with GAD. Your health care provider may refer you to a mental health specialist for further evaluation. TREATMENT The following therapies are usually used to treat GAD:   Medication. Antidepressant medication usually is prescribed for long-term daily control. Antianxiety medicines may be added in severe cases, especially when panic attacks occur.   Talk therapy (psychotherapy). Certain types of talk therapy can be helpful in treating GAD by providing support, education, and guidance. A form of talk therapy called cognitive behavioral therapy can teach you healthy ways to think about and react to daily life events and activities.  Stress managementtechniques. These include yoga, meditation, and exercise and can be very helpful when they are practiced regularly. A mental health specialist can help determine which treatment is best for you. Some people see improvement with one therapy. However, other people require a combination of therapies.   This information is not intended to replace advice given to you by your health care provider. Make sure you discuss any questions you have with your health care provider.   Document Released: 06/19/2012 Document Revised: 03/15/2014 Document Reviewed: 06/19/2012 Elsevier Interactive Patient Education 2016 [employer].  Smoking Cessation, Tips for Success If you are ready to quit smoking, congratulations! You have chosen to help yourself be healthier. Cigarettes bring nicotine, tar, carbon monoxide, and other irritants into your body. Your lungs, heart, and blood vessels will be able to work better without these poisons. There are many different ways to quit smoking. Nicotine gum, nicotine patches, a nicotine inhaler, or nicotine nasal spray can help with physical craving. Hypnosis, support groups, and medicines help break the  habit of  smoking. WHAT THINGS CAN I DO TO MAKE QUITTING EASIER?  Here are some tips to help you quit for good:  Pick a date when you will quit smoking completely. Tell all of your friends and family about your plan to quit on that date.  Do not try to slowly cut down on the number of cigarettes you are smoking. Pick a quit date and quit smoking completely starting on that day.  Throw away all cigarettes.   Clean and remove all ashtrays from your home, work, and car.  On a card, write down your reasons for quitting. Carry the card with you and read it when you get the urge to smoke.  Cleanse your body of nicotine. Drink enough water and fluids to keep your urine clear or pale yellow. Do this after quitting to flush the nicotine from your body.  Learn to predict your moods. Do not let a bad situation be your excuse to have a cigarette. Some situations in your life might tempt you into wanting a cigarette.  Never have "just one" cigarette. It leads to wanting another and another. Remind yourself of your decision to quit.  Change habits associated with smoking. If you smoked while driving or when feeling stressed, try other activities to replace smoking. Stand up when drinking your coffee. Brush your teeth after eating. Sit in a different chair when you read the paper. Avoid alcohol while trying to quit, and try to drink fewer caffeinated beverages. Alcohol and caffeine may urge you to smoke.  Avoid foods and drinks that can trigger a desire to smoke, such as sugary or spicy foods and alcohol.  Ask people who smoke not to smoke around you.  Have something planned to do right after eating or having a cup of coffee. For example, plan to take a walk or exercise.  Try a relaxation exercise to calm you down and decrease your stress. Remember, you may be tense and nervous for the first 2 weeks after you quit, but this will pass.  Find new activities to keep your hands busy. Play with a pen, coin, or  rubber band. Doodle or draw things on paper.  Brush your teeth right after eating. This will help cut down on the craving for the taste of tobacco after meals. You can also try mouthwash.   Use oral substitutes in place of cigarettes. Try using lemon drops, carrots, cinnamon sticks, or chewing gum. Keep them handy so they are available when you have the urge to smoke.  When you have the urge to smoke, try deep breathing.  Designate your home as a nonsmoking area.  If you are a heavy smoker, ask your health care provider about a prescription for nicotine chewing gum. It can ease your withdrawal from nicotine.  Reward yourself. Set aside the cigarette money you save and buy yourself something nice.  Look for support from others. Join a support group or smoking cessation program. Ask someone at home or at work to help you with your plan to quit smoking.  Always ask yourself, "Do I need this cigarette or is this just a reflex?" Tell yourself, "Today, I choose not to smoke," or "I do not want to smoke." You are reminding yourself of your decision to quit.  Do not replace cigarette smoking with electronic cigarettes (commonly called e-cigarettes). The safety of e-cigarettes is unknown, and some may contain harmful chemicals.  If you relapse, do not give up! Plan ahead and think about  what you will do the next time you get the urge to smoke. HOW WILL I FEEL WHEN I QUIT SMOKING? You may have symptoms of withdrawal because your body is used to nicotine (the addictive substance in cigarettes). You may crave cigarettes, be irritable, feel very hungry, cough often, get headaches, or have difficulty concentrating. The withdrawal symptoms are only temporary. They are strongest when you first quit but will go away within 10-14 days. When withdrawal symptoms occur, stay in control. Think about your reasons for quitting. Remind yourself that these are signs that your body is healing and getting used to being  without cigarettes. Remember that withdrawal symptoms are easier to treat than the major diseases that smoking can cause.  Even after the withdrawal is over, expect periodic urges to smoke. However, these cravings are generally short lived and will go away whether you smoke or not. Do not smoke! WHAT RESOURCES ARE AVAILABLE TO HELP ME QUIT SMOKING? Your health care provider can direct you to community resources or hospitals for support, which may include:  Group support.  Education.  Hypnosis.  Therapy.   This information is not intended to replace advice given to you by your health care provider. Make sure you discuss any questions you have with your health care provider.   Document Released: 11/21/2003 Document Revised: 03/15/2014 Document Reviewed: 08/10/2012 Elsevier Interactive Patient Education 2016 [employer].  Suicidal Feelings: How to Help Yourself Suicide is the taking of one's own life. If you feel as though life is getting too tough to handle and are thinking about suicide, get help right away. To get help:  Call your local emergency services (911 in the [place]  Call a suicide hotline to speak with a trained counselor who understands how you are feeling. The following is a list of suicide hotlines in the [place]. For a list of hotlines in [place], visit FindSkins.pl.  1-800-273-TALK (813)500-3082).  1-800-SUICIDE 915-033-6131).  (604) 591-0624. This is a hotline for Spanish speakers.  1-062-694-8NIO 386-639-6735). This is a hotline for TTY users.  1-866-4-U-TREVOR (916)155-0484). This is a hotline for lesbian, gay, bisexual, transgender, or questioning youth.  Contact a crisis center or a local suicide prevention center. To find a crisis center or suicide prevention center:  Call your local hospital, clinic, community service organization, mental health center, social service provider, or  health department. Ask for assistance in connecting to a crisis center.  Visit BankingRep.com.au for a list of crisis centers in the [place], or visit www.suicideprevention.ca/thinking-about-suicide/find-a-crisis-centre for a list of centers in [place].  Visit the following websites:  National Suicide Prevention Lifeline: www.suicidepreventionlifeline.org  Hopeline: www.hopeline.[place] for Suicide Prevention: PromotionalLoans.co.za  [employer] (for lesbian, gay, bisexual, transgender, or questioning youth): www.thetrevorproject.org HOW CAN I HELP MYSELF FEEL BETTER?  Promise yourself that you will not do anything drastic when you have suicidal feelings. Remember, there is hope. Many people have gotten through suicidal thoughts and feelings, and you will, too. You may have gotten through them before, and this proves that you can get through them again.  Let family, friends, teachers, or counselors know how you are feeling. Try not to isolate yourself from those who care about you. Remember, they will want to help you. Talk with someone every day, even if you do not feel sociable. Face-to-face conversation is best.  Call a mental health professional and see one regularly.  Visit your primary health care provider every year.  Eat a well-balanced diet, and space your  meals so you eat regularly.  Get plenty of rest.  Avoid alcohol and drugs, and remove them from your home. They will only make you feel worse.  If you are thinking of taking a lot of medicine, give your medicine to someone who can give it to you one day at a time. If you are on antidepressants and are concerned you will overdose, let your health care provider know so he or she can give you safer medicines. Ask your mental health professional about the possible side effects of any medicines you are taking.  Remove weapons, poisons, knives, and anything else that could  harm you from your home.  Try to stick to routines. Follow a schedule every day. Put self-care on your schedule.  Make a list of realistic goals, and cross them off when you achieve them. Accomplishments give a sense of worth.  Wait until you are feeling better before doing the things you find difficult or unpleasant.  Exercise if you are able. You will feel better if you exercise for even a half hour each day.  Go out in the sun or into nature. This will help you recover from depression faster. If you have a favorite place to walk, go there.  Do the things that have always given you pleasure. Play your favorite music, read a good book, paint a picture, play your favorite instrument, or do anything else that takes your mind off your depression if it is safe to do.  Keep your living space well lit.  When you are feeling well, write yourself a letter about tips and support that you can read when you are not feeling well.  Remember that life's difficulties can be sorted out with help. Conditions can be treated. You can work on thoughts and strategies that serve you well.   This information is not intended to replace advice given to you by your health care provider. Make sure you discuss any questions you have with your health care provider.   Document Released: 08/29/2002 Document Revised: 03/15/2014 Document Reviewed: 06/19/2013 Elsevier Interactive Patient Education 2016 [place] and Stress Management Stress is a normal reaction to life events. It is what you feel when life demands more than you are used to or more than you can handle. Some stress can be useful. For example, the stress reaction can help you catch the last bus of the day, study for a test, or meet a deadline at work. But stress that occurs too often or for too long can cause problems. It can affect your emotional health and interfere with relationships and normal daily activities. Too much stress can weaken your  immune system and increase your risk for physical illness. If you already have a medical problem, stress can make it worse. CAUSES  All sorts of life events may cause stress. An event that causes stress for one person may not be stressful for another person. Major life events commonly cause stress. These may be positive or negative. Examples include losing your job, moving into a new home, getting married, having a baby, or losing a loved one. Less obvious life events may also cause stress, especially if they occur day after day or in combination. Examples include working long hours, driving in traffic, caring for children, being in debt, or being in a difficult relationship. SIGNS AND SYMPTOMS Stress may cause emotional symptoms including, the following:  Anxiety. This is feeling worried, afraid, on edge, overwhelmed, or out of control.  Anger. This is feeling irritated or impatient.  Depression. This is feeling sad, down, helpless, or guilty.  Difficulty focusing, remembering, or making decisions. Stress may cause physical symptoms, including the following:   Aches and pains. These may affect your head, neck, back, stomach, or other areas of your body.  Tight muscles or clenched jaw.  Low energy or trouble sleeping. Stress may cause unhealthy behaviors, including the following:   Eating to feel better (overeating) or skipping meals.  Sleeping too little, too much, or both.  Working too much or putting off tasks (procrastination).  Smoking, drinking alcohol, or using drugs to feel better. DIAGNOSIS  Stress is diagnosed through an assessment by your health care provider. Your health care provider will ask questions about your symptoms and any stressful life events.Your health care provider will also ask about your medical history and may order blood tests or other tests. Certain medical conditions and medicine can cause physical symptoms similar to stress. Mental illness can cause  emotional symptoms and unhealthy behaviors similar to stress. Your health care provider may refer you to a mental health professional for further evaluation.  TREATMENT  Stress management is the recommended treatment for stress.The goals of stress management are reducing stressful life events and coping with stress in healthy ways.  Techniques for reducing stressful life events include the following:  Stress identification. Self-monitor for stress and identify what causes stress for you. These skills may help you to avoid some stressful events.  Time management. Set your priorities, keep a calendar of events, and learn to say "no." These tools can help you avoid making too many commitments. Techniques for coping with stress include the following:  Rethinking the problem. Try to think realistically about stressful events rather than ignoring them or overreacting. Try to find the positives in a stressful situation rather than focusing on the negatives.  Exercise. Physical exercise can release both physical and emotional tension. The key is to find a form of exercise you enjoy and do it regularly.  Relaxation techniques. These relax the body and mind. Examples include yoga, meditation, tai chi, biofeedback, deep breathing, progressive muscle relaxation, listening to music, being out in nature, journaling, and other hobbies. Again, the key is to find one or more that you enjoy and can do regularly.  Healthy lifestyle. Eat a balanced diet, get plenty of sleep, and do not smoke. Avoid using alcohol or drugs to relax.  Strong support network. Spend time with family, friends, or other people you enjoy being around.Express your feelings and talk things over with someone you trust. Counseling or talktherapy with a mental health professional may be helpful if you are having difficulty managing stress on your own. Medicine is typically not recommended for the treatment of stress.Talk to your health care  provider if you think you need medicine for symptoms of stress. HOME CARE INSTRUCTIONS  Keep all follow-up visits as directed by your health care provider.  Take all medicines as directed by your health care provider. SEEK MEDICAL CARE IF:  Your symptoms get worse or you start having new symptoms.  You feel overwhelmed by your problems and can no longer manage them on your own. SEEK IMMEDIATE MEDICAL CARE IF:  You feel like hurting yourself or someone else.   This information is not intended to replace advice given to you by your health care provider. Make sure you discuss any questions you have with your health care provider.   Document Released: 08/18/2000 Document Revised: 03/15/2014 Document  Reviewed: 10/17/2012 Elsevier Interactive Patient Education 2016 [place] A no-harm Surveyor, mining is a written or verbal agreement between you and a mental health professional to promote safety. It contains specific actions and promises you agree to. The agreement also includes instructions from the therapist or doctor. The instructions will help prevent you from harming yourself or harming others. Harm can be as mild as pinching yourself, but can increase in intensity to actions like burning or cutting yourself. The extreme level of self-harm would be committing suicide. No-harm safety contracts are also sometimes referred to as a Radiographer, therapeutic, suicide Electrical engineer, no-harm agreements or decisions, or a Surveyor, mining.  REASONS FOR NO-HARM SAFETY CONTRACTS Safety contracts are just one part of an overall treatment plan to help keep you safe and free of harm. A safety contract may help to relieve anxiety, restore a sense of control, state clearly the alternatives to harm or suicide, and give you and your therapist or doctor a gauge for how you are doing in between visits. Many factors impact the decision to use a no-harm safety contract and its  effectiveness. A proper overall treatment plan and evaluation and good patient understanding are the keys to good outcomes. CONTRACT ELEMENTS  A contract can range from simple to complex. They include all or some of the following:  Action statements. These are statements you agree to do or not do. Example: If I feel my life is becoming too difficult, I agree to do the following so there is no harm to myself or others:  Talk with family or friends.  Rid myself of all things that I could use to harm myself.  Do an activity I enjoy or have enjoyed in the recent past. Coping strategies. These are ways to think and feel that decrease stress, such as:  Use of affirmations or positive statements about self.  Good self-care, including improved grooming, and healthy eating, and healthy sleeping patterns.  Increase physical exercise.  Increase social involvement.  Focus on positive aspects of life. Crisis management. This would include what to do if there was trouble following the contract or an urge to harm. This might include notifying family or your therapist of suicidal thoughts. Be open and honest about suicidal urges. To prevent a crisis, do the following:  List reasons to reach out for support.  Keep contact numbers and available hours handy. Treatment goals. These are goals would include no suicidal thoughts, improved mood, and feelings of hopefulness. Listed responsibilities of different people involved in care. This could include family members. A family member may agree to remove firearms or other lethal weapons/substances from your ease of access. A timeline. A timeline can be in place from one therapy session to the next session. HOME CARE INSTRUCTIONS   Follow your no-harm safety contract.  Contact your therapist and/or doctor if you have any questions or concerns. MAKE SURE YOU:   Understand these instructions.  Will watch your condition. Noticing any mood changes or  suicidal urges.  Will get help right away if you are not doing well or get worse.   This information is not intended to replace advice given to you by your health care provider. Make sure you discuss any questions you have with your health care provider.   Document Released: 08/12/2009 Document Revised: 03/15/2014 Document Reviewed: 08/12/2009 Elsevier Interactive Patient Education [employer].

## 2015-09-02 NOTE — ED Notes (Signed)
Patient was alert, oriented and stable upon discharge. RN went over AVS and patient had no further questions. Pt contracted for safety and was given a copy of his paperwork that he signed stating this.

## 2015-09-03 MED FILL — PERPHENAZINE 8 MG TABLET: 8 | 90 days supply | Qty: 90 | Fill #0

## 2015-09-03 MED FILL — BENZTROPINE MES 1 MG TABLET: 1 | 90 days supply | Qty: 180 | Fill #0

## 2015-09-03 MED FILL — SERTRALINE HCL 100 MG TAB: 100 | 90 days supply | Qty: 90 | Fill #0

## 2015-09-04 DIAGNOSIS — F3342 Major depressive disorder, recurrent, in full remission: Secondary | ICD-10-CM | POA: Diagnosis not present

## 2015-09-04 MED FILL — LITHIUM CARBONATE 300 MG CA: 300 | 30 days supply | Qty: 60 | Fill #0

## 2015-09-16 DIAGNOSIS — L509 Urticaria, unspecified: Secondary | ICD-10-CM | POA: Diagnosis not present

## 2015-09-18 DIAGNOSIS — F202 Catatonic schizophrenia: Secondary | ICD-10-CM | POA: Diagnosis not present

## 2015-09-18 DIAGNOSIS — F1210 Cannabis abuse, uncomplicated: Secondary | ICD-10-CM | POA: Diagnosis not present

## 2015-09-18 DIAGNOSIS — F411 Generalized anxiety disorder: Secondary | ICD-10-CM | POA: Diagnosis not present

## 2015-09-18 DIAGNOSIS — F1010 Alcohol abuse, uncomplicated: Secondary | ICD-10-CM | POA: Diagnosis not present

## 2015-09-23 ENCOUNTER — Encounter (HOSPITAL_BASED_OUTPATIENT_CLINIC_OR_DEPARTMENT_OTHER): Payer: Self-pay | Admitting: Emergency Medicine

## 2015-09-23 ENCOUNTER — Emergency Department (HOSPITAL_BASED_OUTPATIENT_CLINIC_OR_DEPARTMENT_OTHER)
Admission: EM | Admit: 2015-09-23 | Discharge: 2015-09-23 | Disposition: A | Discharge status: Home / Self Care | Payer: 59 | Attending: Emergency Medicine | Admitting: Emergency Medicine

## 2015-09-23 DIAGNOSIS — T7840XA Allergy, unspecified, initial encounter: Secondary | ICD-10-CM

## 2015-09-23 DIAGNOSIS — J45909 Unspecified asthma, uncomplicated: Secondary | ICD-10-CM | POA: Insufficient documentation

## 2015-09-23 DIAGNOSIS — F209 Schizophrenia, unspecified: Secondary | ICD-10-CM | POA: Insufficient documentation

## 2015-09-23 DIAGNOSIS — L509 Urticaria, unspecified: Secondary | ICD-10-CM | POA: Diagnosis present

## 2015-09-23 DIAGNOSIS — F17200 Nicotine dependence, unspecified, uncomplicated: Secondary | ICD-10-CM | POA: Diagnosis not present

## 2015-09-23 MED ORDER — PREDNISONE 50 MG PO TABS
60.0000 mg | ORAL_TABLET | Freq: Once | ORAL | Status: AC
  Administered 2015-09-23: 60 mg via ORAL
  Filled 2015-09-23: qty 1

## 2015-09-23 MED ORDER — FAMOTIDINE 20 MG PO TABS: 20.0000 mg | ORAL_TABLET | Freq: Two times a day (BID) | ORAL | Status: DC

## 2015-09-23 MED ORDER — PREDNISONE 20 MG PO TABS: ORAL_TABLET | ORAL | Status: DC

## 2015-09-23 MED ORDER — DIPHENHYDRAMINE HCL 25 MG PO CAPS
25.0000 mg | ORAL_CAPSULE | Freq: Once | ORAL | Status: AC
  Administered 2015-09-23: 25 mg via ORAL
  Filled 2015-09-23: qty 1

## 2015-09-23 MED ORDER — FAMOTIDINE 20 MG PO TABS
20.0000 mg | ORAL_TABLET | Freq: Once | ORAL | Status: AC
  Administered 2015-09-23: 20 mg via ORAL
  Filled 2015-09-23: qty 1

## 2015-09-23 MED FILL — predniSONE 20 MG TABS: 20 | 5 days supply | Qty: 11 | Fill #0

## 2015-09-23 MED FILL — FAMOTIDINE 20 MG TABLET: 20 | 15 days supply | Qty: 30 | Fill #0

## 2015-09-23 NOTE — ED Notes (Signed)
Hives generalized over body denies SOB or difficulty swallowing

## 2015-09-23 NOTE — ED Provider Notes (Signed)
CSN: [id number]     Arrival date & time 09/23/15  0027 History   First MD Initiated Contact with Patient 09/23/15 0310     Chief Complaint  Patient presents with  . Urticaria     (Consider location/radiation/quality/duration/timing/severity/associated sxs/prior Treatment) Patient is a 25 y.o. male presenting with urticaria. The history is provided by the patient.  Urticaria This is a new problem. The current episode started more than 2 days ago. The problem occurs constantly. The problem has not changed since onset.Pertinent negatives include no chest pain, no abdominal pain, no headaches and no shortness of breath. Nothing aggravates the symptoms. Nothing relieves the symptoms. He has tried nothing for the symptoms. The treatment provided no relief.    Past Medical History  Diagnosis Date  . Schizophrenia ([place])   . Anxiety   . Asthma    History reviewed. No pertinent past surgical history. History reviewed. No pertinent family history. Social History  Substance Use Topics  . Smoking status: Light Tobacco Smoker -- 0.25 packs/day for .5 years  . Smokeless tobacco: Former Systems developer  . Alcohol Use: Yes     Comment: 4/5s brandy per week in the last week/ two weeks ago was drinking 8-10 beers daily    Review of Systems  HENT: Negative for congestion, drooling, facial swelling, trouble swallowing and voice change.   Respiratory: Positive for stridor. Negative for shortness of breath and wheezing.   Cardiovascular: Negative for chest pain.  Gastrointestinal: Negative for abdominal pain.  Skin: Positive for rash.  Neurological: Negative for headaches.  All other systems reviewed and are negative.     Allergies  Other  Home Medications   Prior to Admission medications   Medication Sig Start Date End Date Taking? Authorizing Provider  acetaminophen (TYLENOL) 500 MG tablet Take 1,000 mg by mouth every 6 (six) hours as needed for moderate pain.    Historical Provider, MD  albuterol  (PROVENTIL HFA;VENTOLIN HFA) 108 (90 BASE) MCG/ACT inhaler Inhale 3 puffs into the lungs every 4 (four) hours as needed for wheezing or shortness of breath.     Historical Provider, MD  benztropine (COGENTIN) 1 MG tablet Take 1 tablet (1 mg total) by mouth daily. Patient taking differently: Take 2 mg by mouth at bedtime.  06/18/13   Ruben Im, PA-C  Chlorpheniramine Maleate (ALLERGY PO) Take 1 tablet by mouth 2 (two) times daily as needed (allergies).    Historical Provider, MD  Cyanocobalamin (VITAMIN B-12 PO) Take 1 tablet by mouth daily.    Historical Provider, MD  dextromethorphan-guaiFENesin (MUCINEX DM) 30-600 MG 12hr tablet Take 1 tablet by mouth 2 (two) times daily as needed for cough.    Historical Provider, MD  Multiple Vitamins-Minerals (HM MULTIVITAMIN ADULT GUMMY PO) Take 1 tablet by mouth daily.    Historical Provider, MD  naproxen (NAPROSYN) 500 MG tablet Take 1 tablet (500 mg total) by mouth 2 (two) times daily with a meal. Patient not taking: Reported on 09/02/2015 12/25/14   Blanchie Dessert, MD  Omega-3 Fatty Acids (FISH OIL PO) Take 1 capsule by mouth daily.    Historical Provider, MD  perphenazine (TRILAFON) 8 MG tablet Take 8 mg by mouth at bedtime.  07/28/15   Historical Provider, MD  Pseudoephedrine-APAP-DM (DAYQUIL PO) Take 30 mLs by mouth every 3 (three) hours as needed (cold symptoms).    Historical Provider, MD  risperiDONE (RISPERDAL M-TABS) 1 MG disintegrating tablet Take 1 tablet (1 mg total) by mouth at bedtime. Patient not taking: Reported  on 09/02/2015 06/18/13   Ruben Im, PA-C  sertraline (ZOLOFT) 100 MG tablet Take 100 mg by mouth at bedtime.  12/08/14   Historical Provider, MD   BP 130/92 mmHg  Pulse 65  Temp(Src) 98.1 F (36.7 C) (Oral)  Resp 18  Ht 6\' 3"  (1.905 m)  Wt 262 lb (118.842 kg)  BMI 32.75 kg/m2  SpO2 99% Physical Exam  Constitutional: He is oriented to person, place, and time. He appears well-developed and well-nourished. No distress.   HENT:  Head: Normocephalic and atraumatic.  Mouth/Throat: Oropharynx is clear and moist.  Eyes: Conjunctivae are normal. Pupils are equal, round, and reactive to light.  Neck: Normal range of motion. Neck supple.  Cardiovascular: Normal rate, regular rhythm and intact distal pulses.   Pulmonary/Chest: Effort normal and breath sounds normal. No respiratory distress. He has no wheezes. He has no rales.  Abdominal: Soft. Bowel sounds are normal. There is no tenderness. There is no rebound and no guarding.  Musculoskeletal: Normal range of motion.  Neurological: He is alert and oriented to person, place, and time.  Skin: Skin is warm and dry.  Psychiatric: He has a normal mood and affect.    ED Course  Procedures (including critical care time) Labs Review Labs Reviewed - No data to display  Imaging Review No results found. I have personally reviewed and evaluated these images and lab results as part of my medical decision-making.   EKG Interpretation None      MDM   Final diagnoses:  Allergic reaction, initial encounter   Filed Vitals:   09/23/15 0039 09/23/15 0319  BP: 130/92 123/81  Pulse: 65 64  Temp: 98.1 F (36.7 C)   Resp: 18 16   Medications  predniSONE (DELTASONE) tablet 60 mg (60 mg Oral Given 09/23/15 0325)  famotidine (PEPCID) tablet 20 mg (20 mg Oral Given 09/23/15 0325)  diphenhydrAMINE (BENADRYL) capsule 25 mg (25 mg Oral Given 09/23/15 0325)    Prednisone, Pepcid and benadryl 1 tab Q 6hrs and follow up with allergy.  All questions answered to parents satisfaction. Based on history and exam patient has been appropriately medically screened and emergency conditions excluded. Patient is stable for discharge at this time. Follow up provided and strict return precautions given  .      Veatrice Kells, MD 09/23/15 [phone]

## 2015-09-30 DIAGNOSIS — F202 Catatonic schizophrenia: Secondary | ICD-10-CM | POA: Diagnosis not present

## 2015-10-02 DIAGNOSIS — F3342 Major depressive disorder, recurrent, in full remission: Secondary | ICD-10-CM | POA: Diagnosis not present

## 2015-10-09 ENCOUNTER — Ambulatory Visit (INDEPENDENT_AMBULATORY_CARE_PROVIDER_SITE_OTHER): Payer: 59 | Admitting: Pediatrics

## 2015-10-09 ENCOUNTER — Encounter: Payer: Self-pay | Admitting: Pediatrics

## 2015-10-09 DIAGNOSIS — J4530 Mild persistent asthma, uncomplicated: Secondary | ICD-10-CM | POA: Insufficient documentation

## 2015-10-09 DIAGNOSIS — L500 Allergic urticaria: Secondary | ICD-10-CM

## 2015-10-09 DIAGNOSIS — J4520 Mild intermittent asthma, uncomplicated: Secondary | ICD-10-CM

## 2015-10-09 DIAGNOSIS — J301 Allergic rhinitis due to pollen: Secondary | ICD-10-CM | POA: Diagnosis not present

## 2015-10-09 NOTE — Patient Instructions (Addendum)
Environmental control of dust mite Keep the cat out of the  bedroom Claritin 10 mg twice a day for itching or hives Pepcid 20 mg twice a day-it can help hives and heartburn. Do foods with salicylates make you itch? Ventolin 2 puffs every 4 hours if needed for wheezing or coughing spells Call me next week if you are not better. If you are better we would consider restarting lithium. The use of Xolair for chronic hives could be considered if you are not better

## 2015-10-09 NOTE — Progress Notes (Signed)
[place] [id number] Dept: [phone]  New Patient Note  Patient ID: Leon Mason, male    DOB: 06/26/1990  Age: 25 y.o. MRN: [id number] Date of Office Visit: 10/09/2015 Referring provider: No referring provider defined for this encounter.    Chief Complaint: Urticaria (rash all over x 3 weeks)  HPI Leon Mason presents for evaluation of hives for 3 weeks. He had been on lithium for 2 weeks when the hives began. Lithium was stopped but  the hives have continued. As treatment of the hives he did not have a dramatic improvement with the use of prednisone for several days. Cogentin was stopped a week ago and reportedly he does not need to be on it any longer. He has continued to have hives. He has had some relief with Pepcid. He has never had hives in the past. Last year he had an eczematoid rash for 2 months. He has a history of asthma since a few months of age. He also has had seasonal allergic rhinitis for several years. His asthma is well controlled and he rarely has to use albuterol.. Sometimes he has some shortness of breath with exercise. His schizophrenia is in remission. He does have depression and generalized anxiety disorder  Review of Systems  Constitutional: Negative.   HENT:       Seasonal allergic rhinitis for several years  Eyes: Negative.   Respiratory:       Asthma since a few weeks of life. Hospitalizations for asthma at age 47 and at age 12. Pleurisy once but no pneumonia  Cardiovascular: Negative.   Gastrointestinal:       Occasional heartburn  Genitourinary: Negative.   Musculoskeletal: Negative.   Skin:       Hives for 3 weeks History of eczema but not a problem currently.  Neurological: Negative.   Endo/Heme/Allergies:       No diabetes or thyroid disease  Psychiatric/Behavioral:       Schizophrenia in remission. Generalized anxiety disorder. Depression    Outpatient Encounter Prescriptions as of 10/09/2015  Medication Sig  .  acetaminophen (TYLENOL) 500 MG tablet Take 1,000 mg by mouth every 6 (six) hours as needed for moderate pain.  Marland Kitchen albuterol (PROVENTIL HFA;VENTOLIN HFA) 108 (90 BASE) MCG/ACT inhaler Inhale 3 puffs into the lungs every 4 (four) hours as needed for wheezing or shortness of breath.   . B Complex-C-Folic Acid (STRESS B COMPLEX PO) Take 200 mg by mouth.  . famotidine (PEPCID) 20 MG tablet Take 1 tablet (20 mg total) by mouth 2 (two) times daily.  Marland Kitchen loratadine (CLARITIN) 10 MG tablet Take 10 mg by mouth daily.  . Multiple Vitamins-Minerals (HM MULTIVITAMIN ADULT GUMMY PO) Take 1 tablet by mouth daily.  . Omega-3 Fatty Acids (FISH OIL PO) Take 1,200 mg by mouth 2 (two) times daily.   Marland Kitchen perphenazine (TRILAFON) 8 MG tablet Take 8 mg by mouth at bedtime.   . sertraline (ZOLOFT) 100 MG tablet Take 100 mg by mouth at bedtime.   . [DISCONTINUED] Cyanocobalamin (VITAMIN B-12 PO) Take 1 tablet by mouth daily.  . benztropine (COGENTIN) 1 MG tablet Take 1 tablet (1 mg total) by mouth daily. (Patient not taking: Reported on 10/09/2015)  . [DISCONTINUED] Chlorpheniramine Maleate (ALLERGY PO) Take 1 tablet by mouth 2 (two) times daily as needed (allergies).  . [DISCONTINUED] dextromethorphan-guaiFENesin (MUCINEX DM) 30-600 MG 12hr tablet Take 1 tablet by mouth 2 (two) times daily as needed for cough.  . [DISCONTINUED] naproxen (  NAPROSYN) 500 MG tablet Take 1 tablet (500 mg total) by mouth 2 (two) times daily with a meal. (Patient not taking: Reported on 09/02/2015)  . [DISCONTINUED] predniSONE (DELTASONE) 20 MG tablet 3 tabs po day one, then 2 po daily x 4 days  . [DISCONTINUED] Pseudoephedrine-APAP-DM (DAYQUIL PO) Take 30 mLs by mouth every 3 (three) hours as needed (cold symptoms).  . [DISCONTINUED] risperiDONE (RISPERDAL M-TABS) 1 MG disintegrating tablet Take 1 tablet (1 mg total) by mouth at bedtime. (Patient not taking: Reported on 09/02/2015)   No facility-administered encounter medications on file as of 10/09/2015.       Drug Allergies:  Allergies  Allergen Reactions  . Other Hives and Shortness Of Breath    Dogs and Cats.    Family History: Ashlee's family history includes COPD in his maternal aunt; Eczema in his brother..Family history is negative for asthma, sinus problems, hives, food allergies, Lupus.  Social and environmental. He has a cat in the home. He  smoked cigarettes for 2 or 3 years and quit smoking cigarettes about a month ago. He lives at home. His schizophrenia is in remission  Physical Exam: BP 132/88   Pulse (!) 105   Temp 98.6 F (37 C) (Oral)   Resp 20   Ht 6' 2.5" (1.892 m)   Wt 270 lb (122.5 kg)   SpO2 95%   BMI 34.20 kg/m    Physical Exam  Constitutional: He is oriented to person, place, and time. He appears well-developed and well-nourished.  HENT:  Eyes normal. Ears normal. Nose mild swelling of nasal turbinates. Pharynx normal.  Neck: Neck supple. No thyromegaly present.  Cardiovascular:  S1 and S2 normal no murmurs  Pulmonary/Chest:  Clear to percussion and auscultation  Abdominal: Soft. There is no tenderness (no hepatosplenomegaly).  Lymphadenopathy:    He has no cervical adenopathy.  Neurological: He is alert and oriented to person, place, and time.  Skin:  Multiple hives  Psychiatric: He has a normal mood and affect. His behavior is normal. Judgment and thought content normal.  Vitals reviewed.   Diagnostics: FVC 6.49 L FEV1 4.82 L. Predicted FVC 6.44 L predicted FEV1 5.24 L. After albuterol 2 puffs FVC 6.35 L FEV1 5.18 L-the spirometry is in the normal range and there was no significant improvement after albuterol  Allergy skin test were positive to grass pollen, a common weed, tree pollens, dust mite, cat, dog, cockroach. Slight reactions noted to some molds   Assessment  Assessment and Plan: 1. Allergic rhinitis due to pollen   2. Allergic urticaria   3. Mild intermittent asthma, uncomplicated     No orders of the defined types were  placed in this encounter.   Patient Instructions  Environmental control of dust mite Keep the cat out of the  bedroom Claritin 10 mg twice a day for itching or hives Pepcid 20 mg twice a day-it can help hives and heartburn. Do foods with salicylates make you itch? Ventolin 2 puffs every 4 hours if needed for wheezing or coughing spells Call me next week if you are not better. If you are better we would consider restarting lithium. The use of Xolair for chronic hives could be considered if you are not better   Return in about 6 weeks (around 11/20/2015).   Thank you for the opportunity to care for this patient.  Please do not hesitate to contact me with questions.  Penne Lash, M.D.  [facility]  [place], [place] [address] [phone]

## 2015-10-10 DIAGNOSIS — F202 Catatonic schizophrenia: Secondary | ICD-10-CM | POA: Diagnosis not present

## 2015-10-13 DIAGNOSIS — F3342 Major depressive disorder, recurrent, in full remission: Secondary | ICD-10-CM | POA: Diagnosis not present

## 2015-10-17 DIAGNOSIS — F3342 Major depressive disorder, recurrent, in full remission: Secondary | ICD-10-CM | POA: Diagnosis not present

## 2015-10-22 DIAGNOSIS — F411 Generalized anxiety disorder: Secondary | ICD-10-CM | POA: Diagnosis not present

## 2015-10-22 DIAGNOSIS — F202 Catatonic schizophrenia: Secondary | ICD-10-CM | POA: Diagnosis not present

## 2015-10-23 DIAGNOSIS — F3342 Major depressive disorder, recurrent, in full remission: Secondary | ICD-10-CM | POA: Diagnosis not present

## 2015-10-24 DIAGNOSIS — F202 Catatonic schizophrenia: Secondary | ICD-10-CM | POA: Diagnosis not present

## 2015-10-29 DIAGNOSIS — F3342 Major depressive disorder, recurrent, in full remission: Secondary | ICD-10-CM | POA: Diagnosis not present

## 2015-11-18 MED FILL — LITHIUM CARBONATE 300 MG CA: 300 | 30 days supply | Qty: 30 | Fill #0

## 2015-11-18 MED FILL — GABAPENTIN 100 MG CAPSULE: 100 | 30 days supply | Qty: 90 | Fill #0

## 2015-11-18 MED FILL — PERPHENAZINE 4 MG TABLET: 4 | 12 days supply | Qty: 39 | Fill #0

## 2015-11-18 MED FILL — SERTRALINE HCL 100 MG TAB: 100 | 30 days supply | Qty: 45 | Fill #0

## 2015-11-24 ENCOUNTER — Emergency Department (HOSPITAL_BASED_OUTPATIENT_CLINIC_OR_DEPARTMENT_OTHER): Payer: Self-pay

## 2015-11-24 ENCOUNTER — Encounter (HOSPITAL_BASED_OUTPATIENT_CLINIC_OR_DEPARTMENT_OTHER): Payer: Self-pay | Admitting: Emergency Medicine

## 2015-11-24 ENCOUNTER — Emergency Department (HOSPITAL_BASED_OUTPATIENT_CLINIC_OR_DEPARTMENT_OTHER)
Admission: EM | Admit: 2015-11-24 | Discharge: 2015-11-24 | Disposition: A | Discharge status: Home / Self Care | Payer: Self-pay | Attending: Emergency Medicine | Admitting: Emergency Medicine

## 2015-11-24 ENCOUNTER — Ambulatory Visit: Payer: 59 | Admitting: Pediatrics

## 2015-11-24 DIAGNOSIS — J45909 Unspecified asthma, uncomplicated: Secondary | ICD-10-CM | POA: Insufficient documentation

## 2015-11-24 DIAGNOSIS — Z87891 Personal history of nicotine dependence: Secondary | ICD-10-CM | POA: Insufficient documentation

## 2015-11-24 DIAGNOSIS — M791 Myalgia: Secondary | ICD-10-CM | POA: Insufficient documentation

## 2015-11-24 DIAGNOSIS — M79604 Pain in right leg: Secondary | ICD-10-CM | POA: Insufficient documentation

## 2015-11-24 DIAGNOSIS — Z79899 Other long term (current) drug therapy: Secondary | ICD-10-CM | POA: Insufficient documentation

## 2015-11-24 DIAGNOSIS — M7918 Myalgia, other site: Secondary | ICD-10-CM

## 2015-11-24 MED ORDER — IPRATROPIUM-ALBUTEROL 0.5-2.5 (3) MG/3ML IN SOLN
3.0000 mL | Freq: Once | RESPIRATORY_TRACT | Status: AC
  Administered 2015-11-24: 3 mL via RESPIRATORY_TRACT
  Filled 2015-11-24: qty 3

## 2015-11-24 NOTE — ED Provider Notes (Signed)
[facility] DEPT [facility] Provider Note   CSN: [id number] Arrival date & time: 11/24/15  1552 By signing my name below, I, Leon Mason, attest that this documentation has been prepared under the direction and in the presence of Leon Decamp, PA-C. Electronically Signed: Georgette Mason, ED Scribe. 11/24/15. 5:03 PM.  History   Chief Complaint Chief Complaint  Patient presents with  . Groin Pain   HPI Comments: Leon Mason is a 25 y.o. male with h/o schizophrenia and anxiety who presents to the Emergency Department complaining of intermittent, throbbing, right groin pain onset a while ago, worsening one day ago after working a long period of time. Pt reports that he feels a "painful bulging" in the "femoral artery". Pt states he has also gained 100 pounds so he "may have stressed some arteries out". Pt is also complaining of right foot pain and right hand pain. He states he feels the same throbbing sensation in these areas. He notes that he thinks he is having a "femural aneurysm" or an "abdominal aortic aneurysm". His symptoms are exacerbated with standing for long periods. Denies h/o hernias. No medications taken PTA. Pt denies fever, numbness, or any other associated symptoms.   The history is provided by the patient. No language interpreter was used.    Past Medical History:  Diagnosis Date  . Anxiety   . Asthma   . Schizophrenia [facility])     Patient Active Problem List   Diagnosis Date Noted  . Allergic rhinitis due to pollen 10/09/2015  . 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 Mild intermittent asthma 10/09/2015  . Psychosis 06/14/2013    Past Surgical History:  Procedure Laterality Date  . [place] EXTRACTION  2009   all four     Home Medications    Prior to Admission medications   Medication Sig Start Date End Date Taking? Authorizing Provider  gabapentin (NEURONTIN) 300 MG capsule Take 300 mg by mouth 3 (three) times daily.   Yes Historical Provider, MD  lithium carbonate  300 MG capsule Take 300 mg by mouth 3 (three) times daily with meals.   Yes Historical Provider, MD  acetaminophen (TYLENOL) 500 MG tablet Take 1,000 mg by mouth every 6 (six) hours as needed for moderate pain.    Historical Provider, MD  albuterol (PROVENTIL HFA;VENTOLIN HFA) 108 (90 BASE) MCG/ACT inhaler Inhale 3 puffs into the lungs every 4 (four) hours as needed for wheezing or shortness of breath.     Historical Provider, MD  B Complex-C-Folic Acid (STRESS B COMPLEX PO) Take 200 mg by mouth.    Historical Provider, MD  benztropine (COGENTIN) 1 MG tablet Take 1 tablet (1 mg total) by mouth daily. Patient not taking: Reported on 10/09/2015 06/18/13   Ruben Im, PA-C  famotidine (PEPCID) 20 MG tablet Take 1 tablet (20 mg total) by mouth 2 (two) times daily. 09/23/15   April Palumbo, MD  loratadine (CLARITIN) 10 MG tablet Take 10 mg by mouth daily.    Historical Provider, MD  Multiple Vitamins-Minerals (HM MULTIVITAMIN ADULT GUMMY PO) Take 1 tablet by mouth daily.    Historical Provider, MD  Omega-3 Fatty Acids (FISH OIL PO) Take 1,200 mg by mouth 2 (two) times daily.     Historical Provider, MD  perphenazine (TRILAFON) 8 MG tablet Take 12 mg by mouth at bedtime.  07/28/15   Historical Provider, MD  sertraline (ZOLOFT) 100 MG tablet Take 100 mg by mouth at bedtime.  12/08/14   Historical Provider, MD  Family History Family History  Problem Relation Age of Onset  . Eczema Brother   . COPD Maternal Aunt   . Asthma Neg Hx   . Allergic rhinitis Neg Hx   . Angioedema Neg Hx   . Urticaria Neg Hx   . Immunodeficiency Neg Hx   . Atopy Neg Hx     Social History Social History  Substance Use Topics  . Smoking status: Former Smoker    Packs/day: 0.25    Years: 0.50    Quit date: 09/08/2015  . Smokeless tobacco: Former Systems developer  . Alcohol use No     Comment: 4/5s brandy per week in the last week/ two weeks ago was drinking 8-10 beers daily     Allergies   Other   Review of  Systems Review of Systems  10 Systems reviewed and all are negative for acute change except as noted in the HPI. Physical Exam Updated Vital Signs BP 142/79 (BP Location: Right Arm)   Pulse 73   Temp 98.6 F (37 C) (Oral)   Resp 18   Ht 6\' 3"  (1.905 m)   Wt 280 lb (127 kg)   SpO2 100%   BMI 35.00 kg/m   Physical Exam  Constitutional: He appears well-developed and well-nourished.  HENT:  Head: Normocephalic.  Eyes: Conjunctivae are normal.  Cardiovascular: Normal rate.   Pulmonary/Chest: Effort normal. No respiratory distress.  Abdominal: He exhibits no distension. Hernia confirmed negative in the right inguinal area.  Genitourinary:  Genitourinary Comments: No right inguinal hernia palpable on exam.  Musculoskeletal: Normal range of motion.  Right lower extremity neurovascularly intact with distal pulses appreciated. Motor sensations intact. Right inguinal area without palpable mass. Femoral artery with pulses appreciated.   Neurological: He is alert.  Skin: Skin is warm and dry.  Psychiatric: He has a normal mood and affect. His behavior is normal.  Nursing note and vitals reviewed.  ED Treatments / Results  DIAGNOSTIC STUDIES: Oxygen Saturation is 100% on RA, normal by my interpretation.    COORDINATION OF CARE: 5:01 PM Discussed treatment plan with pt at bedside which includes x-ray and pt agreed to plan.  Labs (all labs ordered are listed, but only abnormal results are displayed) Labs Reviewed - No data to display  EKG  EKG Interpretation None       Radiology No results found.  Procedures Procedures (including critical care time)  Medications Ordered in ED Medications - No data to display   Initial Impression / Assessment and Plan / ED Course  I have reviewed the triage vital signs and the nursing notes.  Pertinent labs & imaging results that were available during my care of the patient were reviewed by me and considered in my medical decision  making (see chart for details).  Clinical Course   Final Clinical Impressions(s) / ED Diagnoses  I have reviewed and evaluated the relevant imaging studies. I have reviewed the relevant previous healthcare records. I obtained HPI from historian. Patient discussed with supervising physician  ED Course:  Assessment: Pt is a 25yM who presents with right groin pain. Pt concerned with femoral aneurysm. No hx same. On exam, pt in NAD. Nontoxic/nonseptic appearing. VSS. Afebrile. Lungs CTA. Heart RRR. Abdomen nontender soft. RLE NVI. Motor/senastion intact. No inguinal mass palpable. No hernia on exam. Likely musculoskeletal in etiology. Unremarkable exam. Plan is to DC home with follow up to PCP. At time of discharge, Patient is in no acute distress. Vital Signs are stable. Patient is able  to ambulate. Patient able to tolerate PO.    Disposition/Plan:  DC Home Additional Verbal discharge instructions given and discussed with patient.  Pt Instructed to f/u with PCP in the next week for evaluation and treatment of symptoms. Return precautions given Pt acknowledges and agrees with plan  Supervising Physician Forde Dandy, MD  Final diagnoses:  Right leg pain  Musculoskeletal pain   I personally performed the services described in this documentation, which was scribed in my presence. The recorded information has been reviewed and is accurate.   New Prescriptions New Prescriptions   No medications on file     Leon Decamp, PA-C 11/24/15 Lacassine Liu, MD 11/25/15 1038
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
## 2015-11-24 NOTE — ED Triage Notes (Signed)
Patients point to his right groin and states that "it feels like an aneurism"  - patient reports that  Painful bulging "from whatever artery is there". It feels like my heart beat. Reports that it was worse yesterday while he was standing at work. "it does not hurt right now"

## 2015-11-24 NOTE — ED Triage Notes (Signed)
Pt states "I think I am having symptoms of an abdominal aortic aneurysm." Asked pt why he thought he was experiencing this condition, and he reports pulsating sensation to right groin since yesterday.

## 2015-12-10 MED FILL — PERPHENAZINE 4 MG TABLET: 4 | 30 days supply | Qty: 90 | Fill #1

## 2015-12-31 MED FILL — GABAPENTIN 100 MG CAPSULE: 100 | 30 days supply | Qty: 90 | Fill #1

## 2015-12-31 MED FILL — SERTRALINE HCL 100 MG TAB: 100 | 30 days supply | Qty: 45 | Fill #1

## 2015-12-31 MED FILL — LITHIUM CARBONATE 300 MG CA: 300 | 30 days supply | Qty: 30 | Fill #1

## 2016-01-13 MED FILL — PERPHENAZINE 4 MG TABLET: 4 | 17 days supply | Qty: 51 | Fill #2

## 2016-02-10 MED FILL — LITHIUM CARBONATE 300 MG CA: 300 | 30 days supply | Qty: 30 | Fill #0

## 2016-02-10 MED FILL — GABAPENTIN 100 MG CAPSULE: 100 | 30 days supply | Qty: 90 | Fill #0

## 2016-02-10 MED FILL — PERPHENAZINE 4 MG TABLET: 4 | 30 days supply | Qty: 90 | Fill #0

## 2016-02-10 MED FILL — SERTRALINE HCL 100 MG TAB: 100 | 30 days supply | Qty: 45 | Fill #0

## 2016-04-02 MED FILL — GABAPENTIN 100 MG CAPSULE: 100 | 30 days supply | Qty: 90 | Fill #0

## 2016-04-02 MED FILL — PERPHENAZINE 4 MG TABLET: 4 | 30 days supply | Qty: 90 | Fill #0

## 2016-04-02 MED FILL — SERTRALINE HCL 100 MG TAB: 100 | 90 days supply | Qty: 90 | Fill #1

## 2016-04-02 MED FILL — LITHIUM CARBONATE 300 MG CA: 300 | 30 days supply | Qty: 30 | Fill #0

## 2016-04-18 DIAGNOSIS — Z22322 Carrier or suspected carrier of Methicillin resistant Staphylococcus aureus: Secondary | ICD-10-CM | POA: Diagnosis not present

## 2016-04-18 DIAGNOSIS — R0602 Shortness of breath: Secondary | ICD-10-CM | POA: Diagnosis not present

## 2016-04-18 DIAGNOSIS — Z7982 Long term (current) use of aspirin: Secondary | ICD-10-CM | POA: Diagnosis not present

## 2016-04-18 DIAGNOSIS — F1290 Cannabis use, unspecified, uncomplicated: Secondary | ICD-10-CM | POA: Diagnosis not present

## 2016-04-18 DIAGNOSIS — F17210 Nicotine dependence, cigarettes, uncomplicated: Secondary | ICD-10-CM | POA: Diagnosis not present

## 2016-04-18 DIAGNOSIS — J45909 Unspecified asthma, uncomplicated: Secondary | ICD-10-CM | POA: Diagnosis not present

## 2016-04-18 DIAGNOSIS — R079 Chest pain, unspecified: Secondary | ICD-10-CM | POA: Diagnosis not present

## 2016-04-18 DIAGNOSIS — K219 Gastro-esophageal reflux disease without esophagitis: Secondary | ICD-10-CM | POA: Diagnosis not present

## 2016-04-18 DIAGNOSIS — I10 Essential (primary) hypertension: Secondary | ICD-10-CM | POA: Diagnosis not present

## 2016-04-18 DIAGNOSIS — R509 Fever, unspecified: Secondary | ICD-10-CM | POA: Diagnosis not present

## 2016-04-18 DIAGNOSIS — R05 Cough: Secondary | ICD-10-CM | POA: Diagnosis not present

## 2016-05-01 DIAGNOSIS — R45851 Suicidal ideations: Secondary | ICD-10-CM | POA: Diagnosis not present

## 2016-05-01 DIAGNOSIS — L0231 Cutaneous abscess of buttock: Secondary | ICD-10-CM | POA: Diagnosis not present

## 2016-05-01 DIAGNOSIS — K760 Fatty (change of) liver, not elsewhere classified: Secondary | ICD-10-CM | POA: Diagnosis not present

## 2016-05-01 DIAGNOSIS — T1491XA Suicide attempt, initial encounter: Secondary | ICD-10-CM | POA: Diagnosis not present

## 2016-05-01 DIAGNOSIS — R509 Fever, unspecified: Secondary | ICD-10-CM | POA: Diagnosis not present

## 2016-05-01 DIAGNOSIS — R109 Unspecified abdominal pain: Secondary | ICD-10-CM | POA: Diagnosis not present

## 2016-05-02 DIAGNOSIS — R109 Unspecified abdominal pain: Secondary | ICD-10-CM | POA: Diagnosis not present

## 2016-05-02 DIAGNOSIS — F329 Major depressive disorder, single episode, unspecified: Secondary | ICD-10-CM | POA: Diagnosis not present

## 2016-05-02 DIAGNOSIS — L0231 Cutaneous abscess of buttock: Secondary | ICD-10-CM | POA: Diagnosis not present

## 2016-05-02 DIAGNOSIS — F17210 Nicotine dependence, cigarettes, uncomplicated: Secondary | ICD-10-CM | POA: Diagnosis not present

## 2016-05-02 DIAGNOSIS — F1911 Other psychoactive substance abuse, in remission: Secondary | ICD-10-CM | POA: Diagnosis not present

## 2016-05-02 DIAGNOSIS — K4090 Unilateral inguinal hernia, without obstruction or gangrene, not specified as recurrent: Secondary | ICD-10-CM | POA: Diagnosis not present

## 2016-05-02 DIAGNOSIS — R509 Fever, unspecified: Secondary | ICD-10-CM | POA: Diagnosis not present

## 2016-05-02 DIAGNOSIS — J45909 Unspecified asthma, uncomplicated: Secondary | ICD-10-CM | POA: Diagnosis not present

## 2016-05-02 DIAGNOSIS — T1491XA Suicide attempt, initial encounter: Secondary | ICD-10-CM | POA: Diagnosis not present

## 2016-05-02 DIAGNOSIS — F609 Personality disorder, unspecified: Secondary | ICD-10-CM | POA: Diagnosis not present

## 2016-05-02 DIAGNOSIS — K760 Fatty (change of) liver, not elsewhere classified: Secondary | ICD-10-CM | POA: Diagnosis not present

## 2016-05-02 DIAGNOSIS — L0501 Pilonidal cyst with abscess: Secondary | ICD-10-CM | POA: Diagnosis not present

## 2016-05-02 DIAGNOSIS — F200 Paranoid schizophrenia: Secondary | ICD-10-CM | POA: Diagnosis not present

## 2016-05-02 DIAGNOSIS — R45851 Suicidal ideations: Secondary | ICD-10-CM | POA: Diagnosis not present

## 2016-05-03 DIAGNOSIS — F329 Major depressive disorder, single episode, unspecified: Secondary | ICD-10-CM | POA: Diagnosis not present

## 2016-05-03 DIAGNOSIS — L0501 Pilonidal cyst with abscess: Secondary | ICD-10-CM | POA: Diagnosis not present

## 2016-06-08 DIAGNOSIS — F3342 Major depressive disorder, recurrent, in full remission: Secondary | ICD-10-CM | POA: Diagnosis not present

## 2016-06-24 ENCOUNTER — Encounter (HOSPITAL_COMMUNITY): Payer: Self-pay | Admitting: *Deleted

## 2016-06-24 ENCOUNTER — Emergency Department (HOSPITAL_COMMUNITY)
Admission: EM | Admit: 2016-06-24 | Discharge: 2016-06-24 | Disposition: A | Discharge status: Home / Self Care | Payer: Worker's Compensation | Attending: Emergency Medicine | Admitting: Emergency Medicine

## 2016-06-24 ENCOUNTER — Emergency Department (HOSPITAL_COMMUNITY): Payer: Worker's Compensation

## 2016-06-24 DIAGNOSIS — M542 Cervicalgia: Secondary | ICD-10-CM | POA: Insufficient documentation

## 2016-06-24 DIAGNOSIS — Y939 Activity, unspecified: Secondary | ICD-10-CM | POA: Insufficient documentation

## 2016-06-24 DIAGNOSIS — Y999 Unspecified external cause status: Secondary | ICD-10-CM | POA: Diagnosis not present

## 2016-06-24 DIAGNOSIS — M546 Pain in thoracic spine: Secondary | ICD-10-CM | POA: Diagnosis not present

## 2016-06-24 DIAGNOSIS — Z79899 Other long term (current) drug therapy: Secondary | ICD-10-CM | POA: Insufficient documentation

## 2016-06-24 DIAGNOSIS — R51 Headache: Secondary | ICD-10-CM | POA: Diagnosis not present

## 2016-06-24 DIAGNOSIS — Z87891 Personal history of nicotine dependence: Secondary | ICD-10-CM | POA: Diagnosis not present

## 2016-06-24 DIAGNOSIS — Y92410 Unspecified street and highway as the place of occurrence of the external cause: Secondary | ICD-10-CM | POA: Insufficient documentation

## 2016-06-24 DIAGNOSIS — M5489 Other dorsalgia: Secondary | ICD-10-CM | POA: Diagnosis not present

## 2016-06-24 DIAGNOSIS — T148XXA Other injury of unspecified body region, initial encounter: Secondary | ICD-10-CM | POA: Diagnosis not present

## 2016-06-24 DIAGNOSIS — G4489 Other headache syndrome: Secondary | ICD-10-CM | POA: Diagnosis not present

## 2016-06-24 DIAGNOSIS — S299XXA Unspecified injury of thorax, initial encounter: Secondary | ICD-10-CM | POA: Diagnosis not present

## 2016-06-24 MED ORDER — METHOCARBAMOL 500 MG PO TABS: 500.0000 mg | ORAL_TABLET | Freq: Two times a day (BID) | ORAL | 0 refills | Status: DC

## 2016-06-24 MED ORDER — ACETAMINOPHEN 500 MG PO TABS
1000.0000 mg | ORAL_TABLET | Freq: Once | ORAL | Status: AC
  Administered 2016-06-24: 1000 mg via ORAL
  Filled 2016-06-24: qty 2
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
## 2016-06-24 NOTE — ED Notes (Signed)
Patient transported to CT 

## 2016-06-24 NOTE — ED Notes (Signed)
Returned from CT.
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
## 2016-06-24 NOTE — Discharge Instructions (Signed)
Take your medications as prescribed. I also recommend applying ice and/or heat to affected area for 15-20 minutes 3-4 times daily for additional pain relief. Refrain from doing any heavy lifting, squatting or repetitive movements that exacerbate your symptoms. Follow-up with your primary care provider in the next week if her symptoms have not improved.  Please return to the Emergency Department if symptoms worsen or new onset of fever, numbness, tingling, groin anesthesia, loss of bowel or bladder, weakness, headache, chest pain, abdominal pain.
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
## 2016-07-06 DIAGNOSIS — F3342 Major depressive disorder, recurrent, in full remission: Secondary | ICD-10-CM | POA: Diagnosis not present

## 2016-07-09 MED FILL — VENTOLIN HFA 90 MCG INHALER: 108 (90 BAS | 17 days supply | Qty: 18 | Fill #0

## 2016-07-19 MED FILL — PERPHENAZINE 4 MG TABLET: 4 | 30 days supply | Qty: 45 | Fill #0

## 2016-07-19 MED FILL — GABAPENTIN 100 MG CAPSULE: 100 | 30 days supply | Qty: 90 | Fill #0

## 2016-07-19 MED FILL — LITHIUM CARBONATE 300 MG CA: 300 | 30 days supply | Qty: 60 | Fill #0

## 2016-07-19 MED FILL — SERTRALINE HCL 100 MG TAB: 100 | 30 days supply | Qty: 45 | Fill #0

## 2016-08-22 ENCOUNTER — Encounter (HOSPITAL_COMMUNITY): Payer: Self-pay

## 2016-08-22 ENCOUNTER — Inpatient Hospital Stay (HOSPITAL_COMMUNITY)
Admission: AD | Admit: 2016-08-22 | Discharge: 2016-08-30 | DRG: 885 | Disposition: A | Discharge status: Home / Self Care | Payer: BLUE CROSS/BLUE SHIELD | Source: Intra-hospital | Attending: Psychiatry | Admitting: Psychiatry

## 2016-08-22 ENCOUNTER — Emergency Department (HOSPITAL_COMMUNITY)
Admission: EM | Admit: 2016-08-22 | Discharge: 2016-08-22 | Disposition: A | Discharge status: Other Acute Inpatient Hospital | Payer: BLUE CROSS/BLUE SHIELD | Attending: Emergency Medicine | Admitting: Emergency Medicine

## 2016-08-22 ENCOUNTER — Encounter (HOSPITAL_COMMUNITY): Payer: Self-pay | Admitting: *Deleted

## 2016-08-22 DIAGNOSIS — F250 Schizoaffective disorder, bipolar type: Secondary | ICD-10-CM | POA: Diagnosis present

## 2016-08-22 DIAGNOSIS — F314 Bipolar disorder, current episode depressed, severe, without psychotic features: Secondary | ICD-10-CM | POA: Diagnosis present

## 2016-08-22 DIAGNOSIS — G4700 Insomnia, unspecified: Secondary | ICD-10-CM | POA: Diagnosis not present

## 2016-08-22 DIAGNOSIS — Z79899 Other long term (current) drug therapy: Secondary | ICD-10-CM | POA: Diagnosis not present

## 2016-08-22 DIAGNOSIS — F411 Generalized anxiety disorder: Secondary | ICD-10-CM | POA: Diagnosis present

## 2016-08-22 DIAGNOSIS — F1020 Alcohol dependence, uncomplicated: Secondary | ICD-10-CM | POA: Diagnosis not present

## 2016-08-22 DIAGNOSIS — Z818 Family history of other mental and behavioral disorders: Secondary | ICD-10-CM | POA: Diagnosis not present

## 2016-08-22 DIAGNOSIS — F319 Bipolar disorder, unspecified: Secondary | ICD-10-CM | POA: Insufficient documentation

## 2016-08-22 DIAGNOSIS — F4321 Adjustment disorder with depressed mood: Secondary | ICD-10-CM | POA: Diagnosis present

## 2016-08-22 DIAGNOSIS — J45909 Unspecified asthma, uncomplicated: Secondary | ICD-10-CM | POA: Insufficient documentation

## 2016-08-22 DIAGNOSIS — F29 Unspecified psychosis not due to a substance or known physiological condition: Secondary | ICD-10-CM | POA: Diagnosis not present

## 2016-08-22 DIAGNOSIS — R45851 Suicidal ideations: Secondary | ICD-10-CM | POA: Diagnosis not present

## 2016-08-22 DIAGNOSIS — Z87891 Personal history of nicotine dependence: Secondary | ICD-10-CM

## 2016-08-22 DIAGNOSIS — F3010 Manic episode without psychotic symptoms, unspecified: Secondary | ICD-10-CM | POA: Diagnosis not present

## 2016-08-22 DIAGNOSIS — Z811 Family history of alcohol abuse and dependence: Secondary | ICD-10-CM

## 2016-08-22 DIAGNOSIS — R45850 Homicidal ideations: Secondary | ICD-10-CM | POA: Diagnosis not present

## 2016-08-22 DIAGNOSIS — J4520 Mild intermittent asthma, uncomplicated: Secondary | ICD-10-CM | POA: Diagnosis not present

## 2016-08-22 HISTORY — DX: Hallucinations, unspecified: R44.3

## 2016-08-22 HISTORY — DX: Bipolar disorder, unspecified: F31.9

## 2016-08-22 LAB — CBC WITH DIFFERENTIAL/PLATELET
BASOS ABS: 0 10*3/uL (ref 0.0–0.1)
BASOS PCT: 0 %
EOS ABS: 0.2 10*3/uL (ref 0.0–0.7)
Eosinophils Relative: 3 %
HCT: 43 % (ref 39.0–52.0)
Hemoglobin: 14.8 g/dL (ref 13.0–17.0)
Lymphocytes Relative: 26 %
Lymphs Abs: 1.8 10*3/uL (ref 0.7–4.0)
MCH: 29.8 pg (ref 26.0–34.0)
MCHC: 34.4 g/dL (ref 30.0–36.0)
MCV: 86.5 fL (ref 78.0–100.0)
MONO ABS: 0.5 10*3/uL (ref 0.1–1.0)
Monocytes Relative: 7 %
Neutro Abs: 4.4 10*3/uL (ref 1.7–7.7)
Neutrophils Relative %: 64 %
PLATELETS: 177 10*3/uL (ref 150–400)
RBC: 4.97 MIL/uL (ref 4.22–5.81)
RDW: 13.8 % (ref 11.5–15.5)
WBC: 7 10*3/uL (ref 4.0–10.5)

## 2016-08-22 LAB — RAPID URINE DRUG SCREEN, HOSP PERFORMED
Amphetamines: NOT DETECTED
BENZODIAZEPINES: NOT DETECTED
Barbiturates: NOT DETECTED
Cocaine: NOT DETECTED
OPIATES: NOT DETECTED
Tetrahydrocannabinol: NOT DETECTED

## 2016-08-22 LAB — BASIC METABOLIC PANEL
ANION GAP: 10 (ref 5–15)
BUN: 16 mg/dL (ref 6–20)
CALCIUM: 9.6 mg/dL (ref 8.9–10.3)
CO2: 26 mmol/L (ref 22–32)
Chloride: 106 mmol/L (ref 101–111)
Creatinine, Ser: 1.03 mg/dL (ref 0.61–1.24)
GLUCOSE: 97 mg/dL (ref 65–99)
POTASSIUM: 3.6 mmol/L (ref 3.5–5.1)
SODIUM: 142 mmol/L (ref 135–145)

## 2016-08-22 LAB — ETHANOL

## 2016-08-22 MED ORDER — HYDROXYZINE HCL 25 MG PO TABS
25.0000 mg | ORAL_TABLET | Freq: Three times a day (TID) | ORAL | Status: DC | PRN
Start: 2016-08-22 — End: 2016-08-23
  Administered 2016-08-23: 25 mg via ORAL
  Filled 2016-08-22: qty 1

## 2016-08-22 MED ORDER — GABAPENTIN 100 MG PO CAPS
100.0000 mg | ORAL_CAPSULE | Freq: Three times a day (TID) | ORAL | Status: DC
  Administered 2016-08-22 – 2016-08-30 (×23): 100 mg via ORAL
  Filled 2016-08-22 (×34): qty 1

## 2016-08-22 MED ORDER — LITHIUM CARBONATE 300 MG PO CAPS
300.0000 mg | ORAL_CAPSULE | Freq: Two times a day (BID) | ORAL | Status: DC
  Administered 2016-08-22 – 2016-08-30 (×16): 300 mg via ORAL
  Filled 2016-08-22 (×22): qty 1

## 2016-08-22 MED ORDER — ALUM & MAG HYDROXIDE-SIMETH 200-200-20 MG/5ML PO SUSP
30.0000 mL | ORAL | Status: DC | PRN
  Administered 2016-08-23: 30 mL via ORAL
  Filled 2016-08-22: qty 30

## 2016-08-22 MED ORDER — TRAZODONE HCL 50 MG PO TABS
50.0000 mg | ORAL_TABLET | Freq: Every evening | ORAL | Status: DC | PRN
  Administered 2016-08-23 – 2016-08-27 (×5): 50 mg via ORAL
  Filled 2016-08-22 (×6): qty 1

## 2016-08-22 MED ORDER — MAGNESIUM HYDROXIDE 400 MG/5ML PO SUSP: 30.0000 mL | Freq: Every day | ORAL | Status: DC | PRN

## 2016-08-22 MED ORDER — BENZTROPINE MESYLATE 1 MG PO TABS
1.0000 mg | ORAL_TABLET | Freq: Every day | ORAL | Status: DC
  Administered 2016-08-22 – 2016-08-30 (×9): 1 mg via ORAL
  Filled 2016-08-22 (×13): qty 1

## 2016-08-22 MED ORDER — ACETAMINOPHEN 325 MG PO TABS: 650.0000 mg | ORAL_TABLET | Freq: Four times a day (QID) | ORAL | Status: DC | PRN

## 2016-08-22 MED ORDER — ALBUTEROL SULFATE HFA 108 (90 BASE) MCG/ACT IN AERS
2.0000 | INHALATION_SPRAY | RESPIRATORY_TRACT | Status: DC | PRN
  Filled 2016-08-22: qty 6.7

## 2016-08-22 NOTE — ED Provider Notes (Signed)
[place] DEPT Provider Note   CSN: [id number] Arrival date & time: 08/22/16  0055     History   Chief Complaint Chief Complaint  Patient presents with  . V70.1    HPI Leon Mason is a 26 y.o. male.  HPI  This is a 26 year old male with history of schizophrenia, bipolar disorder who presents with suicidal ideation. Patient reports that he plans to drive his car off a bridge. He has not been taking his medications for several days. He states he "lost them." Denies hallucinations. Does report history suicidal ideation but no history of attempts. Reports alcohol use yesterday. Denies other drug use. Reports increasing stressors at home. He had to give up his apartment and moved back in with his mother.  Past Medical History:  Diagnosis Date  . Anxiety   . Asthma   . Bipolar 1 disorder ([place])   . Hallucinations   . Schizophrenia [facility])     Patient Active Problem List   Diagnosis Date Noted  . Allergic rhinitis due to pollen 10/09/2015  . 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 Mild intermittent asthma 10/09/2015  . Psychosis 06/14/2013    Past Surgical History:  Procedure Laterality Date  . [place] EXTRACTION  2009   all four       Home Medications    Prior to Admission medications   Medication Sig Start Date End Date Taking? Authorizing Provider  gabapentin (NEURONTIN) 300 MG capsule Take 100 mg by mouth 3 (three) times daily.    Yes [provider]  lithium carbonate 300 MG capsule Take 300 mg by mouth 2 (two) times daily with a meal.    Yes [provider]  loratadine (CLARITIN) 10 MG tablet Take 10 mg by mouth daily.   Yes [provider]  perphenazine (TRILAFON) 8 MG tablet Take 12 mg by mouth at bedtime.  07/28/15  Yes [provider]  sertraline (ZOLOFT) 100 MG tablet Take 150 mg by mouth at bedtime.  12/08/14  Yes [provider]  acetaminophen (TYLENOL) 500 MG tablet Take 1,000 mg by mouth every 6 (six)  hours as needed for moderate pain.    [provider]  albuterol (PROVENTIL HFA;VENTOLIN HFA) 108 (90 BASE) MCG/ACT inhaler Inhale 3 puffs into the lungs every 4 (four) hours as needed for wheezing or shortness of breath.     [provider]  B Complex-C-Folic Acid (STRESS B COMPLEX PO) Take 200 mg by mouth.    [provider]  benztropine (COGENTIN) 1 MG tablet Take 1 tablet (1 mg total) by mouth daily. 06/18/13   Ruben Im, PA-C  famotidine (PEPCID) 20 MG tablet Take 1 tablet (20 mg total) by mouth 2 (two) times daily. 09/23/15   Palumbo, April, MD  methocarbamol (ROBAXIN) 500 MG tablet Take 1 tablet (500 mg total) by mouth 2 (two) times daily. 06/24/16   Nona Dell, PA-C  Multiple Vitamins-Minerals (HM MULTIVITAMIN ADULT GUMMY PO) Take 1 tablet by mouth daily.    [provider]  Omega-3 Fatty Acids (FISH OIL PO) Take 1,200 mg by mouth 2 (two) times daily.     [provider]    Family History Family History  Problem Relation Age of Onset  . Eczema Brother   . COPD Maternal Aunt   . Asthma Neg Hx   . Allergic rhinitis Neg Hx   . Angioedema Neg Hx   . Urticaria Neg Hx   . Immunodeficiency Neg Hx   .  Atopy Neg Hx     Social History Social History  Substance Use Topics  . Smoking status: Former Smoker    Packs/day: 0.25    Years: 0.50    Quit date: 09/08/2015  . Smokeless tobacco: Former Systems developer  . Alcohol use No     Comment: 4/5s brandy per week in the last week/ two weeks ago was drinking 8-10 beers daily     Allergies   Other   Review of Systems Review of Systems  Psychiatric/Behavioral: Positive for dysphoric mood and suicidal ideas. Negative for hallucinations and self-injury. The patient is nervous/anxious.   All other systems reviewed and are negative.    Physical Exam Updated Vital Signs BP 132/79   Pulse 89   Temp 98.3 F (36.8 C) (Oral)   Resp 20   Ht 6\' 2"  (1.88 m)   Wt 131.5 kg (290 lb)    SpO2 97%   BMI 37.23 kg/m   Physical Exam  Constitutional: He is oriented to person, place, and time. No distress.  Overweight  HENT:  Head: Normocephalic and atraumatic.  Cardiovascular: Normal rate, regular rhythm and normal heart sounds.   No murmur heard. Pulmonary/Chest: Effort normal and breath sounds normal. No respiratory distress. He has no wheezes.  Musculoskeletal: He exhibits no edema.  Neurological: He is alert and oriented to person, place, and time.  Skin: Skin is warm and dry.  Psychiatric: He has a normal mood and affect.  Flat affect, will not make eye contact  Nursing note and vitals reviewed.    ED Treatments / Results  Labs (all labs ordered are listed, but only abnormal results are displayed) Labs Reviewed  CBC WITH DIFFERENTIAL/PLATELET  BASIC METABOLIC PANEL  RAPID URINE DRUG SCREEN, HOSP PERFORMED  ETHANOL    EKG  EKG Interpretation None       Radiology No results found.  Procedures Procedures (including critical care time)  Medications Ordered in ED Medications - No data to display   Initial Impression / Assessment and Plan / ED Course  I have reviewed the triage vital signs and the nursing notes.  Pertinent labs & imaging results that were available during my care of the patient were reviewed by me and considered in my medical decision making (see chart for details).     Patient presents with suicidal ideation. Otherwise nontoxic. Lab work pending. TTS consulted. No physical complaints at this time. Expect medical clearance.  2:31 AM Patient is medically clear. He meets inpatient criteria.  Final Clinical Impressions(s) / ED Diagnoses   Final diagnoses:  Suicidal ideation    New Prescriptions New Prescriptions   No medications on file     Merryl Hacker, MD 08/22/16 [id number]

## 2016-08-22 NOTE — Progress Notes (Signed)
D.  Pt is a new admission to unit this evening, has remained in bed in no apparent distress.  Respirations even and unlabored.  A.  Support and encouragement offered  R. Pt remains safe on the unit, will continue to monitor.
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
## 2016-08-22 NOTE — ED Notes (Signed)
Ford, from Behavior health recommends inpatient but is currently full will send request for placements.

## 2016-08-22 NOTE — Progress Notes (Signed)
Pt accepted to [facility], Bed 300-2, Lindon Romp, NP, is the accepting provider, Dr. Parke Poisson attending, Call report to [phone].  Pt. May come at any time. [facility] notified. Areatha Keas. Judi Cong, MSW, [place] Work Disposition [phone]
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
## 2016-08-22 NOTE — Progress Notes (Signed)
DURANTE VIOLETT is a 26 y.o. male voluntary admitted for SI with a plan to jump off the bridge  from [facility]. 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 Pt states the poverty is also a stressor, he survives on the money his mother pay him to take care of her. Pt also stated he does not believes in christianity and does not like to see people reading bible or talking about christianity. Pt alert and oriented during admission, calm and cooperative. Consents signed, skin/belongings search completed and pt oriented to unit. Pt stable at this time. Pt given the opportunity to express concerns and ask questions. Pt given toiletries. Will continue to monitor.

## 2016-08-22 NOTE — Tx Team (Signed)
Initial Treatment Plan 08/22/2016 5:10 PM Leon Mason [id number]    PATIENT STRESSORS: Financial difficulties Health problems Substance abuse   PATIENT STRENGTHS: Ability for insight Average or above average intelligence Motivation for treatment/growth   PATIENT IDENTIFIED PROBLEMS: anxiety  Depression  "Make sure I am medicated"  "Have a support system"               DISCHARGE CRITERIA:  Ability to meet basic life and health needs Adequate post-discharge living arrangements Improved stabilization in mood, thinking, and/or behavior Medical problems require only outpatient monitoring  PRELIMINARY DISCHARGE PLAN: Attend aftercare/continuing care group Attend PHP/IOP Attend 12-step recovery group Outpatient therapy  PATIENT/FAMILY INVOLVEMENT: This treatment plan has been presented to and reviewed with the patient, Leon Mason, and/or family member.  The patient and family have been given the opportunity to ask questions and make suggestions.  Wolfgang Phoenix, RN 08/22/2016, 5:10 PM

## 2016-08-22 NOTE — Progress Notes (Signed)
Patient did not attend the evening speaker AA meeting. Pt remained sleeping during group time.   

## 2016-08-22 NOTE — ED Notes (Signed)
Pt in room-tele psch

## 2016-08-23 ENCOUNTER — Encounter (HOSPITAL_COMMUNITY): Payer: Self-pay | Admitting: Psychiatry

## 2016-08-23 DIAGNOSIS — F250 Schizoaffective disorder, bipolar type: Secondary | ICD-10-CM | POA: Diagnosis present

## 2016-08-23 DIAGNOSIS — F1020 Alcohol dependence, uncomplicated: Secondary | ICD-10-CM | POA: Diagnosis present

## 2016-08-23 MED ORDER — ADULT MULTIVITAMIN W/MINERALS CH
1.0000 | ORAL_TABLET | Freq: Every day | ORAL | Status: DC
Start: 2016-08-23 — End: 2016-08-30
  Administered 2016-08-23 – 2016-08-30 (×8): 1 via ORAL
  Filled 2016-08-23 (×12): qty 1

## 2016-08-23 MED ORDER — LORAZEPAM 1 MG PO TABS
1.0000 mg | ORAL_TABLET | Freq: Four times a day (QID) | ORAL | Status: AC | PRN
  Administered 2016-08-23 – 2016-08-24 (×2): 1 mg via ORAL
  Filled 2016-08-23: qty 1

## 2016-08-23 MED ORDER — NICOTINE 21 MG/24HR TD PT24
21.0000 mg | MEDICATED_PATCH | Freq: Every day | TRANSDERMAL | Status: DC
  Administered 2016-08-23 – 2016-08-25 (×3): 21 mg via TRANSDERMAL
  Filled 2016-08-23 (×4): qty 1

## 2016-08-23 MED ORDER — NYSTATIN 100000 UNIT/GM EX CREA
TOPICAL_CREAM | Freq: Two times a day (BID) | CUTANEOUS | Status: DC
  Administered 2016-08-23 – 2016-08-30 (×13): via TOPICAL
  Filled 2016-08-23 (×2): qty 15

## 2016-08-23 MED ORDER — LORAZEPAM 1 MG PO TABS
1.0000 mg | ORAL_TABLET | Freq: Every day | ORAL | Status: AC
  Administered 2016-08-27: 1 mg via ORAL
  Filled 2016-08-23: qty 1

## 2016-08-23 MED ORDER — LOPERAMIDE HCL 2 MG PO CAPS: 2.0000 mg | ORAL_CAPSULE | ORAL | Status: AC | PRN

## 2016-08-23 MED ORDER — PERPHENAZINE 4 MG PO TABS
4.0000 mg | ORAL_TABLET | Freq: Three times a day (TID) | ORAL | Status: DC
  Administered 2016-08-23 – 2016-08-30 (×21): 4 mg via ORAL
  Filled 2016-08-23 (×27): qty 1

## 2016-08-23 MED ORDER — HYDROXYZINE HCL 25 MG PO TABS
25.0000 mg | ORAL_TABLET | Freq: Four times a day (QID) | ORAL | Status: AC | PRN
  Administered 2016-08-23 – 2016-08-26 (×4): 25 mg via ORAL
  Filled 2016-08-23 (×4): qty 1

## 2016-08-23 MED ORDER — SERTRALINE HCL 50 MG PO TABS
150.0000 mg | ORAL_TABLET | Freq: Every day | ORAL | Status: DC
  Administered 2016-08-24 – 2016-08-30 (×7): 150 mg via ORAL
  Filled 2016-08-23 (×9): qty 1

## 2016-08-23 MED ORDER — VITAMIN B-1 100 MG PO TABS
100.0000 mg | ORAL_TABLET | Freq: Every day | ORAL | Status: DC
  Administered 2016-08-24 – 2016-08-30 (×7): 100 mg via ORAL
  Filled 2016-08-23 (×11): qty 1

## 2016-08-23 MED ORDER — LORAZEPAM 1 MG PO TABS
1.0000 mg | ORAL_TABLET | Freq: Two times a day (BID) | ORAL | Status: AC
  Administered 2016-08-26 (×2): 1 mg via ORAL
  Filled 2016-08-23 (×3): qty 1

## 2016-08-23 MED ORDER — LORAZEPAM 1 MG PO TABS
1.0000 mg | ORAL_TABLET | Freq: Four times a day (QID) | ORAL | Status: AC
  Administered 2016-08-23 – 2016-08-24 (×6): 1 mg via ORAL
  Filled 2016-08-23 (×6): qty 1

## 2016-08-23 MED ORDER — LORAZEPAM 1 MG PO TABS
1.0000 mg | ORAL_TABLET | Freq: Three times a day (TID) | ORAL | Status: AC
  Administered 2016-08-25 (×3): 1 mg via ORAL
  Filled 2016-08-23 (×3): qty 1

## 2016-08-23 MED ORDER — THIAMINE HCL 100 MG/ML IJ SOLN
100.0000 mg | Freq: Once | INTRAMUSCULAR | Status: AC
  Administered 2016-08-23: 100 mg via INTRAMUSCULAR
  Filled 2016-08-23: qty 2

## 2016-08-23 MED ORDER — ONDANSETRON 4 MG PO TBDP: 4.0000 mg | ORAL_TABLET | Freq: Three times a day (TID) | ORAL | Status: DC | PRN

## 2016-08-23 MED ORDER — ONDANSETRON 4 MG PO TBDP
4.0000 mg | ORAL_TABLET | Freq: Four times a day (QID) | ORAL | Status: DC | PRN
  Administered 2016-08-23: 4 mg via ORAL
  Filled 2016-08-23: qty 1

## 2016-08-23 NOTE — BHH Suicide Risk Assessment (Signed)
[facility] Admission Suicide Risk Assessment   Nursing information obtained from:    Demographic factors:    Current Mental Status:    Loss Factors:    Historical Factors:    Risk Reduction Factors:     Total Time spent with patient: 30 minutes Principal Problem: Schizoaffective disorder, bipolar type ([place]) Diagnosis:   Patient Active Problem List   Diagnosis Date Noted  . Schizoaffective disorder, bipolar type (Leon Mason) [F25.0] 08/23/2016  . Alcohol use disorder, severe, dependence ([place]) [F10.20] 08/23/2016  . Allergic rhinitis due to pollen [J30.1] 10/09/2015  . 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 Mild intermittent asthma [J45.20] 10/09/2015  . Psychosis [F29] 06/14/2013   Subjective Data: Patient with mood sx, psychosis , alcoholism,noncompliance with medications, reports he gave up on life, quit taking medications a week ago and was planning to kill self by driving of a bridge. Pt wants to be back on his home medications , is open to psychotherapy. Pt also abuses alcohol - 5-6 beers per day , will also start CIWA protocol.  Continued Clinical Symptoms:  Alcohol Use Disorder Identification Test Final Score (AUDIT): 16 The "Alcohol Use Disorders Identification Test", Guidelines for Use in Primary Care, Second Edition.  World Pharmacologist [facility]). Score between 0-7:  no or low risk or alcohol related problems. Score between 8-15:  moderate risk of alcohol related problems. Score between 16-19:  high risk of alcohol related problems. Score 20 or above:  warrants further diagnostic evaluation for alcohol dependence and treatment.   CLINICAL FACTORS:   Severe Anxiety and/or Agitation Panic Attacks Alcohol/Substance Abuse/Dependencies Currently Psychotic Unstable or Poor Therapeutic Relationship Previous Psychiatric Diagnoses and Treatments   Musculoskeletal: Strength & Muscle Tone: within normal limits Gait & Station: seen in bed Patient leans: N/A  Psychiatric Specialty  Exam: Physical Exam  Review of Systems  Psychiatric/Behavioral: Positive for depression, hallucinations, substance abuse and suicidal ideas. The patient is nervous/anxious and has insomnia.   All other systems reviewed and are negative.   Blood pressure (!) 145/83, pulse 100, temperature 98.5 F (36.9 C), temperature source Oral, resp. rate 18, height 6' (1.829 m), weight 131.5 kg (290 lb), SpO2 100 %.Body mass index is 39.33 kg/m.  General Appearance: Casual  Eye Contact:  Poor  Speech:  Normal Rate  Volume:  Normal  Mood:  Anxious, Depressed and Dysphoric  Affect:  Congruent  Thought Process:  Goal Directed and Descriptions of Associations: Circumstantial  Orientation:  Full (Time, Place, and Person)  Thought Content:  Delusions, Hallucinations: Auditory Visual, Paranoid Ideation and Rumination  Suicidal Thoughts:  Yes.  with intent/plan  Homicidal Thoughts:  Yes.  without intent/plan  Memory:  Immediate;   Fair Recent;   Fair Remote;   Fair  Judgement:  Impaired  Insight:  Fair  Psychomotor Activity:  Normal  Concentration:  Concentration: Fair and Attention Span: Fair  Recall:  [employer] of Knowledge:  Fair  Language:  Fair  Akathisia:  No  Handed:  Right  AIMS (if indicated):     Assets:  Desire for Improvement  ADL's:  Intact  Cognition:  WNL  Sleep:  Number of Hours: 2.75      COGNITIVE FEATURES THAT CONTRIBUTE TO RISK:  Closed-mindedness, Polarized thinking and Thought constriction (tunnel vision)    SUICIDE RISK:   Moderate:  Frequent suicidal ideation with limited intensity, and duration, some specificity in terms of plans, no associated intent, good self-control, limited dysphoria/symptomatology, some risk factors present, and identifiable protective factors, including available  and accessible social support.  PLAN OF CARE: Patient states he is OK with restarting his previous medications zoloft, lithium , perphenazine , was noncompliant for a week. Please  see HP also.  I certify that inpatient services furnished can reasonably be expected to improve the patient's condition.   Mase Dhondt, MD 08/23/2016, 2:22 PM

## 2016-08-23 NOTE — Tx Team (Signed)
Interdisciplinary Treatment and Diagnostic Plan Update  08/23/2016 Time of Session: 0930 Leon Mason MRN: [id number]  Principal Diagnosis: Schizoaffective disorder, bipolar type [facility])  Secondary Diagnoses: Principal Problem:   Schizoaffective disorder, bipolar type ([place]) Active Problems:   Alcohol use disorder, severe, dependence ([place])   Current Medications:  Current Facility-Administered Medications  Medication Dose Route Frequency Provider Last Rate Last Dose  . acetaminophen (TYLENOL) tablet 650 mg  650 mg Oral Q6H PRN Okonkwo, Justina A, NP      . albuterol (PROVENTIL HFA;VENTOLIN HFA) 108 (90 Base) MCG/ACT inhaler 2 puff  2 puff Inhalation Q4H PRN Lindon Romp A, NP      . alum & mag hydroxide-simeth (MAALOX/MYLANTA) 200-200-20 MG/5ML suspension 30 mL  30 mL Oral Q4H PRN Okonkwo, Justina A, NP      . benztropine (COGENTIN) tablet 1 mg  1 mg Oral Daily Okonkwo, Justina A, NP   1 mg at 08/23/16 0804  . gabapentin (NEURONTIN) capsule 100 mg  100 mg Oral TID Hughie Closs A, NP   Stopped at 08/23/16 1450  . hydrOXYzine (ATARAX/VISTARIL) tablet 25 mg  25 mg Oral Q6H PRN Eappen, Saramma, MD      . lithium carbonate capsule 300 mg  300 mg Oral BID WC Okonkwo, Justina A, NP   300 mg at 08/23/16 0804  . loperamide (IMODIUM) capsule 2-4 mg  2-4 mg Oral PRN Eappen, Saramma, MD      . LORazepam (ATIVAN) tablet 1 mg  1 mg Oral Q6H PRN Eappen, Saramma, MD      . LORazepam (ATIVAN) tablet 1 mg  1 mg Oral QID Ursula Alert, MD       Followed by  . [START ON 08/25/2016] LORazepam (ATIVAN) tablet 1 mg  1 mg Oral TID Ursula Alert, MD       Followed by  . [START ON 08/26/2016] LORazepam (ATIVAN) tablet 1 mg  1 mg Oral BID Ursula Alert, MD       Followed by  . [START ON 08/27/2016] LORazepam (ATIVAN) tablet 1 mg  1 mg Oral Daily Eappen, Saramma, MD      . magnesium hydroxide (MILK OF MAGNESIA) suspension 30 mL  30 mL Oral Daily PRN Okonkwo, Justina A, NP      . multivitamin with  minerals tablet 1 tablet  1 tablet Oral Daily Eappen, Saramma, MD      . nicotine (NICODERM CQ - dosed in mg/24 hours) patch 21 mg  21 mg Transdermal Daily Cobos, Myer Peer, MD   21 mg at 08/23/16 0804  . ondansetron (ZOFRAN-ODT) disintegrating tablet 4 mg  4 mg Oral Q6H PRN Ursula Alert, MD   4 mg at 08/23/16 1449  . ondansetron (ZOFRAN-ODT) disintegrating tablet 4 mg  4 mg Oral Q8H PRN Derrill Center, NP      . perphenazine (TRILAFON) tablet 4 mg  4 mg Oral TID Ursula Alert, MD      . Derrill Memo ON 08/24/2016] sertraline (ZOLOFT) tablet 150 mg  150 mg Oral Daily Eappen, Saramma, MD      . thiamine (B-1) injection 100 mg  100 mg Intramuscular Once Ursula Alert, MD      . Derrill Memo ON 08/24/2016] thiamine (VITAMIN B-1) tablet 100 mg  100 mg Oral Daily Eappen, Saramma, MD      . traZODone (DESYREL) tablet 50 mg  50 mg Oral QHS PRN Lu Duffel, Justina A, NP       PTA Medications: Prescriptions Prior to Admission  Medication Sig Dispense  Refill Last Dose  . albuterol (PROVENTIL HFA;VENTOLIN HFA) 108 (90 BASE) MCG/ACT inhaler Inhale 2 puffs into the lungs every 4 (four) hours as needed for wheezing or shortness of breath.    08/20/2016  . calcium carbonate (TUMS - DOSED IN MG ELEMENTAL CALCIUM) 500 MG chewable tablet Chew 2 tablets by mouth daily.   08/20/2016  . gabapentin (NEURONTIN) 100 MG capsule Take 100 mg by mouth 3 (three) times daily.   08/18/2016  . lithium carbonate 300 MG capsule Take 300 mg by mouth 2 (two) times daily with a meal.    08/18/2016  . loratadine (CLARITIN) 10 MG tablet Take 10 mg by mouth daily.   08/21/2016  . perphenazine (TRILAFON) 8 MG tablet Take 12 mg by mouth at bedtime.   0 08/18/2016  . sertraline (ZOLOFT) 100 MG tablet Take 150 mg by mouth at bedtime.   0 08/18/2016  . benztropine (COGENTIN) 1 MG tablet Take 1 tablet (1 mg total) by mouth daily. (Patient not taking: Reported on 08/23/2016) 30 tablet 0 Not Taking at Unknown time  . famotidine (PEPCID) 20 MG tablet Take 1  tablet (20 mg total) by mouth 2 (two) times daily. (Patient not taking: Reported on 08/23/2016) 30 tablet 0 Not Taking at Unknown time  . methocarbamol (ROBAXIN) 500 MG tablet Take 1 tablet (500 mg total) by mouth 2 (two) times daily. (Patient not taking: Reported on 08/23/2016) 20 tablet 0 Not Taking at Unknown time    Patient Stressors: Financial difficulties Health problems Substance abuse  Patient Strengths: Ability for insight Average or above average intelligence Motivation for treatment/growth  Treatment Modalities: Medication Management, Group therapy, Case management,  1 to 1 session with clinician, Psychoeducation, Recreational therapy.   Physician Treatment Plan for Primary Diagnosis: Schizoaffective disorder, bipolar type ([place]) Long Term Goal(s):     Short Term Goals:    Medication Management: Evaluate patient's response, side effects, and tolerance of medication regimen.  Therapeutic Interventions: 1 to 1 sessions, Unit Group sessions and Medication administration.  Evaluation of Outcomes: Progressing  Physician Treatment Plan for Secondary Diagnosis: Principal Problem:   Schizoaffective disorder, bipolar type ([place]) Active Problems:   Alcohol use disorder, severe, dependence ([place])  Long Term Goal(s):     Short Term Goals:       Medication Management: Evaluate patient's response, side effects, and tolerance of medication regimen.  Therapeutic Interventions: 1 to 1 sessions, Unit Group sessions and Medication administration.  Evaluation of Outcomes: Progressing   RN Treatment Plan for Primary Diagnosis: Schizoaffective disorder, bipolar type ([place]) Long Term Goal(s): Knowledge of disease and therapeutic regimen to maintain health will improve  Short Term Goals: Ability to remain free from injury will improve, Ability to disclose and discuss suicidal ideas and Ability to identify and develop effective coping behaviors will improve  Medication Management: RN will  administer medications as ordered by provider, will assess and evaluate patient's response and provide education to patient for prescribed medication. RN will report any adverse and/or side effects to prescribing provider.  Therapeutic Interventions: 1 on 1 counseling sessions, Psychoeducation, Medication administration, Evaluate responses to treatment, Monitor vital signs and CBGs as ordered, Perform/monitor CIWA, COWS, AIMS and Fall Risk screenings as ordered, Perform wound care treatments as ordered.  Evaluation of Outcomes: Progressing   LCSW Treatment Plan for Primary Diagnosis: Schizoaffective disorder, bipolar type ([place]) Long Term Goal(s): Safe transition to appropriate next level of care at discharge, Engage patient in therapeutic group addressing interpersonal concerns.  Short Term Goals: Engage  patient in aftercare planning with referrals and resources, Facilitate patient progression through stages of change regarding substance use diagnoses and concerns and Identify triggers associated with mental health/substance abuse issues  Therapeutic Interventions: Assess for all discharge needs, 1 to 1 time with Social worker, Explore available resources and support systems, Assess for adequacy in community support network, Educate family and significant other(s) on suicide prevention, Complete Psychosocial Assessment, Interpersonal group therapy.  Evaluation of Outcomes: Progressing   Progress in Treatment: Attending groups: Yes. Participating in groups: Yes. Taking medication as prescribed: Yes. Toleration medication: Yes. Family/Significant other contact made: No, will contact:  family member if patient consents Patient understands diagnosis: Yes. Discussing patient identified problems/goals with staff: Yes. Medical problems stabilized or resolved: Yes. Denies suicidal/homicidal ideation: Yes. Issues/concerns per patient self-inventory: No. Other: n/a   New problem(s) identified:  No, Describe:  n/a  New Short Term/Long Term Goal(s): SI; depressive symptoms, medication stabilization, development of comprehensive mental wellness/sobriety plan.   Discharge Plan or Barriers:   Reason for Continuation of Hospitalization: Depression Medication stabilization Suicidal ideation Withdrawal symptoms  Estimated Length of Stay: 3-5 days   Attendees: Patient: 08/23/2016 3:12 PM  Physician: Dr. Shea Evans MD 08/23/2016 3:12 PM  Nursing: Jonni Sanger RN 08/23/2016 3:12 PM  RN Care Manager: Lars Pinks CM 08/23/2016 3:12 PM  Social Worker: [place], LCSW 08/23/2016 3:12 PM  Recreational Therapist: x 08/23/2016 3:12 PM  Other: Lindell Spar NP; Ricky Ala NP 08/23/2016 3:12 PM  Other:  08/23/2016 3:12 PM  Other: 08/23/2016 3:12 PM    Scribe for Treatment Team: [place], LCSW 08/23/2016 3:12 PM

## 2016-08-23 NOTE — Progress Notes (Signed)
Recreation Therapy Notes  Date: 08/23/16 Time: 0930 Location: [address] Group Room  Group Topic: Stress Management  Goal Area(s) Addresses:  Patient will verbalize importance of using healthy stress management.  Patient will identify positive emotions associated with healthy stress management.   Intervention: Stress Management  Activity :  Guided Imagery.  LRT introduced the stress management technique of guided imagery.  LRT read a script to allow patients to engage in the technique.  Patients were to follow along as the script was read to fully participate.  Education:  Stress Management, Discharge Planning.   Education Outcome: Acknowledges edcuation/In group clarification offered/Needs additional education  Clinical Observations/Feedback: Pt did not attend group.   Victorino Sparrow, LRT/CTRS        Ria Comment, Analissa Bayless A 08/23/2016 2:16 PM
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
## 2016-08-23 NOTE — B[facility] Group Notes (Signed)
[place] LCSW Group Therapy  08/23/2016 3:19 PM  Type of Therapy:  Group Therapy  Participation Level:  Did Not Attend-pt invited. Chose to remain in bed.   Summary of Progress/Problems: Today's Topic: Overcoming Obstacles. Patients identified one short term goal and potential obstacles in reaching this goal. Patients processed barriers involved in overcoming these obstacles. Patients identified steps necessary for overcoming these obstacles and explored motivation (internal and external) for facing these difficulties head on.   Lilyonna Steidle N Smart LCSW 08/23/2016, 3:19 PM

## 2016-08-23 NOTE — BHH Suicide Risk Assessment (Signed)
[place] INPATIENT:  Family/Significant Other Suicide Prevention Education  Suicide Prevention Education:  Education Completed; Conner Muegge, Pt's father [phone], has been identified by the patient as the family member/significant other with whom the patient will be residing, and identified as the person(s) who will aid the patient in the event of a mental health crisis (suicidal ideations/suicide attempt).  With written consent from the patient, the family member/significant other has been provided the following suicide prevention education, prior to the and/or following the discharge of the patient.  The suicide prevention education provided includes the following:  Suicide risk factors  Suicide prevention and interventions  National Suicide Hotline telephone number  [facility] assessment telephone number  [facility] Emergency Assistance [place] and/or Residential Mobile Crisis Unit telephone number  Request made of family/significant other to:  Remove weapons (e.g., guns, rifles, knives), all items previously/currently identified as safety concern.    Remove drugs/medications (over-the-counter, prescriptions, illicit drugs), all items previously/currently identified as a safety concern.  The family member/significant other verbalizes understanding of the suicide prevention education information provided.  The family member/significant other agrees to remove the items of safety concern listed above.  Pt's father reports that Pt has been making poor choices, drinking and smoking even though he has asthma. Per father, father has been paying his bills. Mother has long hx of mental illness and manipulative behaviors and feels that Pt has the same behaviors.   [place] Lighter 08/23/2016, 2:08 PM

## 2016-08-23 NOTE — Progress Notes (Signed)
DAR. Pt present with depressed mood, pt has been in the room the whole, pt reports having thoughts of hurting others who annoy him and thought its safe for him to stay in the room. Pt contracted for safety. Pt complained of n/v, did not eat lunch. Pt given Zofran, and snacks. Pt stated his night sleep was fair, good appetite, high energy, and good concentration. Pt rates depression at 7, hopelessness at 9, and anxiety at 5. Pt states goal is to " speak with social worker/psychiatrists. Pt's safety ensured with 15 minute and environmental checks. Pt currently denies SI and A/V hallucinations. Pt verbally agrees to seek staff if SI/HI or A/VH occurs and to consult with staff before acting on any harmful thoughts. Will continue POC.
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
## 2016-08-23 NOTE — Progress Notes (Signed)
Pt did not attend AA group, pt remained in bed asleep. Pt encouraged to attend group.

## 2016-08-24 LAB — TSH: TSH: 4.188 u[IU]/mL (ref 0.350–4.500)

## 2016-08-24 LAB — GC/CHLAMYDIA PROBE AMP (~~LOC~~) NOT AT ARMC
CHLAMYDIA, DNA PROBE: NEGATIVE
Neisseria Gonorrhea: NEGATIVE

## 2016-08-24 LAB — LIPID PANEL
CHOLESTEROL: 272 mg/dL — AB (ref 0–200)
HDL: 33 mg/dL — ABNORMAL LOW (ref >40)
LDL Cholesterol: UNDETERMINED mg/dL (ref 0–99)
TRIGLYCERIDES: 474 mg/dL — AB (ref <150)
Total CHOL/HDL Ratio: 8.2 RATIO
VLDL: UNDETERMINED mg/dL (ref 0–40)

## 2016-08-24 LAB — HIV ANTIBODY (ROUTINE TESTING W REFLEX): HIV SCREEN 4TH GENERATION: NONREACTIVE

## 2016-08-24 NOTE — Progress Notes (Signed)
Pt has been more visible on the milieu but continues to refuse group and socialization is limited    He has been otherwise cooperative with treatment and medication compliant    He endorses depression and high anxiety but reports the medications are helping and he feels better    Medications administered and effectiveness monitored    Q 15 checks   Pt remains safe

## 2016-08-24 NOTE — Progress Notes (Signed)
Complained of a "green slime" between his buttocks when he sweats. Will inform the NP and Dr. For one of them to follow up with him on.
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
## 2016-08-24 NOTE — BHH Group Notes (Signed)
[place] LCSW Group Therapy  08/24/2016 3:45 PM  Type of Therapy:  Group Therapy  Participation Level:  Did Not Attend-pt invited. Chose to remain in bed.   Summary of Progress/Problems: [facility] Speaker came to talk about his personal journey with substance abuse and addiction. The pt processed ways by which to relate to the speaker. [place] speaker provided handouts and educational information pertaining to groups and services offered by the [facility].   Leon Mason N Smart LCSW 08/24/2016, 3:45 PM

## 2016-08-24 NOTE — Progress Notes (Signed)
Recreation Therapy Notes  Animal-Assisted Activity (AAA) Program Checklist/Progress Notes Patient Eligibility Criteria Checklist & Daily Group note for Rec TxIntervention  Date: 08/24/2016 Time: 2:50pm Location: 89 hall dayroom  AAA/T Program Assumption of Risk Form signed by Patient/ or Parent Legal Guardian Yes  Behavioral Response: Pt declined participation in Animal Assisted Activity.  Donovan Kail, Recreation Therapy Intern          Donovan Kail 08/24/2016 3:04 PM

## 2016-08-24 NOTE — Progress Notes (Signed)
His Lynden Oxford here requesting to get his keys from him and take them with her. Spoke with him to clarify if he would like her to have his keys and he said I could give them to her. He initialed the Scientist, clinical (histocompatibility and immunogenetics) signed that I gave the keys to her.

## 2016-08-24 NOTE — Progress Notes (Signed)
Leon Mason his self inventory this am and rates his depression is a 5, hopelessness is a 7 and anxiety is a 3. [place] for today is to set up his financial security. He is more pleasant this am than yesterday, but continues to say he is irritable with everyone. Only complaint is his father wont give him the money from a trust fund until he is 6 and he has no income and wants it now.  A-EKG completed. Support offered. Monitored for safety and medications as ordered.  R-Isolative to room and self because he is irritable. Has been cooperative. Not attending groups.

## 2016-08-24 NOTE — Progress Notes (Signed)
Has slept most of the shift, his only complaint today was he is sleepy all the time, and he is irritable all the time.

## 2016-08-24 NOTE — Progress Notes (Signed)
D    Pt stayed in his room and refused to come to group or socialize this evening   He reports having social anxiety   He said people get on his nerves just doing something like coughing or sneezing   He appears depressed and hopeless and endorses same  A    Verbal support given   Medications administered and effectiveness monitored     Q 15 min checks   Encourage some visibility on milieu even if he just sits in group a few minutes R    Pt is safe but continues to resist encouragement to participate more

## 2016-08-25 LAB — HSV 2 ANTIBODY, IGG: HSV 2 Glycoprotein G Ab, IgG: <0.91 index (ref 0.00–0.90)

## 2016-08-25 LAB — HEMOGLOBIN A1C
Hgb A1c MFr Bld: 5.4 % (ref 4.8–5.6)
MEAN PLASMA GLUCOSE: 108 mg/dL

## 2016-08-25 LAB — PROLACTIN: Prolactin: 25.4 ng/mL — ABNORMAL HIGH (ref 4.0–15.2)

## 2016-08-25 MED ORDER — NICOTINE POLACRILEX 2 MG MT GUM
2.0000 mg | CHEWING_GUM | OROMUCOSAL | Status: DC | PRN
Start: 2016-08-25 — End: 2016-08-30
  Administered 2016-08-25 – 2016-08-29 (×17): 2 mg via ORAL
  Filled 2016-08-25: qty 1
  Filled 2016-08-25: qty 10
  Filled 2016-08-25 (×3): qty 1

## 2016-08-25 NOTE — BHH Group Notes (Signed)
[place] LCSW Group Therapy  08/25/2016 3:22 PM  Type of Therapy:  Group Therapy  Participation Level:  Did Not Attend-pt invited. Chose to remain in bed.   Summary of Progress/Problems: Emotion Regulation: This group focused on both positive and negative emotion identification and allowed group members to process ways to identify feelings, regulate negative emotions, and find healthy ways to manage internal/external emotions. Group members were asked to reflect on a time when their reaction to an emotion led to a negative outcome and explored how alternative responses using emotion regulation would have benefited them. Group members were also asked to discuss a time when emotion regulation was utilized when a negative emotion was experienced.   Emani Taussig N Smart LCSW 08/25/2016, 3:22 PM

## 2016-08-25 NOTE — Progress Notes (Cosign Needed)
DAR NOTE: Pt present with flat affect and depressed mood in the unit. Pt complained of anger, anxiety. Pt stated that he gets HI towards anybody who annoys him, but contracted safety. Pt has been visible more in the milieu to day and has been interacting more. Pt denies physical pain, took all his meds as scheduled. As per self inventory, pt had a fair night sleep, good appetite, high energy, and good concentration. Pt rate depression at 5, hopeless ness at 7, and anxiety at 6. Pt's safety ensured with 15 minute and environmental checks. Pt currently denies SI/HI and A/V hallucinations. Pt verbally agrees to seek staff if SI/HI or A/VH occurs and to consult with staff before acting on these thoughts. Will continue POC.

## 2016-08-25 NOTE — Progress Notes (Signed)
Pt attended AA group 

## 2016-08-25 NOTE — Progress Notes (Signed)
[facility] MD Progress Note  08/25/2016 6:45 PM Leon Mason  MRN:  [id number] Subjective:   26 yo Caucasian single, recently lost his job and his apartment. Has been living with his mom and brother. Background history of Alcohol Use Disorder and Schizoaffective disorder. Presented to the ER after he called 911. Had thoughts of driving his car off the bridge. Upset with his father because his father is not sensitive to his financial crisis. Has $30,000 trust that he is cannot assess until the next four years. Wanted to get $2000 off it but his father refused. UDS and BAL were negative. Other parameters were within normal limits.   Chart reviewed today. Patient discussed at team  Staff reports that he is coming out more to the day room. He is less irritable. He is able to tolerate peers more. Remains focused on his social issues. Makes vague homicidal and suicidal thoughts if his financial concerns are not addressed.   Seen today at team. Patient clearly described his financial difficulties. Says he hopes we can reach members of his family and convince them to release his funds. Says he would be fine once he has access to some money. He has thought about disability. Made aware that they is no guarantee and there would be no immediate gratification. Expressed hopelessness if nothing is done to help him. Says he would rather kill himself than "live on noodles". Very desperate that we get his family on board. We discussed response so far from his family. We agreed to invite his family to a meeting on the unit.     Principal Problem: Schizoaffective disorder, bipolar type ([place]) Diagnosis:   Patient Active Problem List   Diagnosis Date Noted  . Schizoaffective disorder, bipolar type ([place]) [F25.0] 08/23/2016  . Alcohol use disorder, severe, dependence ([place]) [F10.20] 08/23/2016  . Allergic rhinitis due to pollen [J30.1] 10/09/2015  . 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 Mild intermittent asthma  [J45.20] 10/09/2015  . Psychosis [F29] 06/14/2013   Total Time spent with patient: 20 minutes  Past Psychiatric History: As in H&P  Past Medical History:  Past Medical History:  Diagnosis Date  . Anxiety   . Asthma   . Bipolar 1 disorder ([place])   . Hallucinations   . Schizophrenia [facility])     Past Surgical History:  Procedure Laterality Date  . WISDOM TOOTH EXTRACTION  2009   all four   Family History:  Family History  Problem Relation Age of Onset  . Eczema Brother   . COPD Maternal Aunt   . Bipolar disorder Mother   . Alcoholism Mother   . Asthma Neg Hx   . Allergic rhinitis Neg Hx   . Angioedema Neg Hx   . Urticaria Neg Hx   . Immunodeficiency Neg Hx   . Atopy Neg Hx    Family Psychiatric  History: As in H&P Social History:  History  Alcohol Use  . 2.4 oz/week  . 4 Cans of beer per week    Comment: 4/5s brandy per week in the last week/ two weeks ago was drinking 8-10 beers daily     History  Drug Use No    Social History   Social History  . Marital status: Single    Spouse name: N/A  . Number of children: N/A  . Years of education: N/A   Social History Main Topics  . Smoking status: Former Smoker    Packs/day: 0.25    Years: 0.50    Quit  date: 09/08/2015  . Smokeless tobacco: Former Systems developer  . Alcohol use 2.4 oz/week    4 Cans of beer per week     Comment: 4/5s brandy per week in the last week/ two weeks ago was drinking 8-10 beers daily  . Drug use: No  . Sexual activity: Not Currently   Other Topics Concern  . None   Social History Narrative  . None   Additional Social History:    Pain Medications: See MAR Prescriptions: See MAR Over the Counter: See MAR History of alcohol / drug use?: Yes Longest period of sobriety (when/how long): Unknown Negative Consequences of Use: Personal relationships, Financial Name of Substance 1: Alcohol 1 - Age of First Use: 15 1 - Amount (size/oz): Five 12-ounce beers 1 - Frequency: Daily 1 - Duration:  Ongoing 1 - Last Use / Amount: 08/21/16         Sleep: Poor at night but has been in bed all day  Appetite:  Poor  Current Medications: Current Facility-Administered Medications  Medication Dose Route Frequency Provider Last Rate Last Dose  . acetaminophen (TYLENOL) tablet 650 mg  650 mg Oral Q6H PRN Okonkwo, Justina A, NP      . albuterol (PROVENTIL HFA;VENTOLIN HFA) 108 (90 Base) MCG/ACT inhaler 2 puff  2 puff Inhalation Q4H PRN Lindon Romp A, NP      . alum & mag hydroxide-simeth (MAALOX/MYLANTA) 200-200-20 MG/5ML suspension 30 mL  30 mL Oral Q4H PRN Okonkwo, Justina A, NP   30 mL at 08/23/16 1525  . benztropine (COGENTIN) tablet 1 mg  1 mg Oral Daily Okonkwo, Justina A, NP   1 mg at 08/25/16 0815  . gabapentin (NEURONTIN) capsule 100 mg  100 mg Oral TID Lu Duffel, Justina A, NP   100 mg at 08/25/16 1703  . hydrOXYzine (ATARAX/VISTARIL) tablet 25 mg  25 mg Oral Q6H PRN Ursula Alert, MD   25 mg at 08/24/16 2127  . lithium carbonate capsule 300 mg  300 mg Oral BID WC Okonkwo, Justina A, NP   300 mg at 08/25/16 1703  . loperamide (IMODIUM) capsule 2-4 mg  2-4 mg Oral PRN Eappen, Ria Clock, MD      . LORazepam (ATIVAN) tablet 1 mg  1 mg Oral Q6H PRN Ursula Alert, MD   1 mg at 08/24/16 0830  . [START ON 08/26/2016] LORazepam (ATIVAN) tablet 1 mg  1 mg Oral BID Ursula Alert, MD       Followed by  . [START ON 08/27/2016] LORazepam (ATIVAN) tablet 1 mg  1 mg Oral Daily Eappen, Saramma, MD      . magnesium hydroxide (MILK OF MAGNESIA) suspension 30 mL  30 mL Oral Daily PRN Okonkwo, Justina A, NP      . multivitamin with minerals tablet 1 tablet  1 tablet Oral Daily Eappen, Saramma, MD   1 tablet at 08/25/16 0815  . nicotine polacrilex (NICORETTE) gum 2 mg  2 mg Oral PRN Lindell Spar I, NP   2 mg at 08/25/16 1703  . nystatin cream (MYCOSTATIN)   Topical BID Derrill Center, NP      . ondansetron (ZOFRAN-ODT) disintegrating tablet 4 mg  4 mg Oral Q8H PRN Derrill Center, NP      .  perphenazine (TRILAFON) tablet 4 mg  4 mg Oral TID Ursula Alert, MD   4 mg at 08/25/16 1703  . sertraline (ZOLOFT) tablet 150 mg  150 mg Oral Daily Eappen, Ria Clock, MD   150 mg at 08/25/16  7628  . thiamine (VITAMIN B-1) tablet 100 mg  100 mg Oral Daily Eappen, Saramma, MD   100 mg at 08/25/16 0815  . traZODone (DESYREL) tablet 50 mg  50 mg Oral QHS PRN Lu Duffel, Justina A, NP   50 mg at 08/24/16 2127    Lab Results:  Results for orders placed or performed during the hospital encounter of 08/22/16 (from the past 48 hour(s))  Hemoglobin A1c     Status: None   Collection Time: 08/24/16  6:43 AM  Result Value Ref Range   Hgb A1c MFr Bld 5.4 4.8 - 5.6 %    Comment: (NOTE)         Pre-diabetes: 5.7 - 6.4         Diabetes: >6.4         Glycemic control for adults with diabetes: <7.0    Mean Plasma Glucose 108 mg/dL    Comment: (NOTE) Performed At: [facility] [place], [place] [id number] Lindon Romp MD [id number] Performed at [facility], [place] [address] [place], [place] [address]   TSH     Status: None   Collection Time: 08/24/16  6:43 AM  Result Value Ref Range   TSH 4.188 0.350 - 4.500 uIU/mL    Comment: Performed by a 3rd Generation assay with a functional sensitivity of <=0.01 uIU/mL. Performed at [facility], [place] [address], [place], [place] [address]   HIV antibody     Status: None   Collection Time: 08/24/16  6:43 AM  Result Value Ref Range   HIV Screen 4th Generation wRfx Non Reactive Non Reactive    Comment: (NOTE) Performed At: [facility] [place], [place] [id number] Lindon Romp MD [id number] Performed at [facility], [place] [address] [place], [place] [address]   HSV 2 antibody, IgG     Status: None   Collection Time: 08/24/16  6:43 AM  Result Value Ref Range   HSV 2 Glycoprotein G Ab, IgG <0.91 0.00 - 0.90 index    Comment: (NOTE)                                  Negative        <0.91                                 Equivocal 0.91 - 1.09                                 Positive        >1.09 Note: Negative indicates no antibodies detected to HSV-2. Equivocal may suggest early infection.  If clinically appropriate, retest at later date. 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 Performed At: [facility] [place], [place] [id number] Lindon Romp MD [id number] Performed at [facility], [place] [address] [place], [place] [address]   Lipid panel     Status: Abnormal   Collection Time: 08/24/16  6:53 AM  Result Value Ref Range   Cholesterol 272 (H) 0 - 200 mg/dL   Triglycerides 474 (H) <150 mg/dL   HDL 33 (L) >40 mg/dL   Total CHOL/HDL Ratio 8.2 RATIO   VLDL UNABLE TO CALCULATE IF TRIGLYCERIDE OVER 400 mg/dL 0 -  40 mg/dL   LDL Cholesterol UNABLE TO CALCULATE IF TRIGLYCERIDE OVER 400 mg/dL 0 - 99 mg/dL    Comment:        Total Cholesterol/HDL:CHD Risk Coronary Heart Disease Risk Table                     Men   Women  1/2 Average Risk   3.4   3.3  Average Risk       5.0   4.4  2 X Average Risk   9.6   7.1  3 X Average Risk  23.4   11.0        Use the calculated Patient Ratio above and the CHD Risk Table to determine the patient's CHD Risk.        ATP III CLASSIFICATION (LDL):  <100     mg/dL   Optimal  100-129  mg/dL   Near or Above                    Optimal  130-159  mg/dL   Borderline  160-189  mg/dL   High  >190     mg/dL   Very High Performed at [place] [address], [place], [place] [address]   Prolactin     Status: Abnormal   Collection Time: 08/24/16  6:53 AM  Result Value Ref Range   Prolactin 25.4 (H) 4.0 - 15.2 ng/mL    Comment: (NOTE) Performed At: [facility] [place], [place] [id number] Lindon Romp MD [id number] Performed at [facility], [place] [address] [place], [place]  [address]     Blood Alcohol level:  Lab Results  Component Value Date   [facility] <5 08/22/2016   ETH <11 71/24/5809    Metabolic Disorder Labs: Lab Results  Component Value Date   HGBA1C 5.4 08/24/2016   MPG 108 08/24/2016   Lab Results  Component Value Date   PROLACTIN 25.4 (H) 08/24/2016   Lab Results  Component Value Date   CHOL 272 (H) 08/24/2016   TRIG 474 (H) 08/24/2016   HDL 33 (L) 08/24/2016   CHOLHDL 8.2 08/24/2016   VLDL UNABLE TO CALCULATE IF TRIGLYCERIDE OVER 400 mg/dL 08/24/2016   LDLCALC UNABLE TO CALCULATE IF TRIGLYCERIDE OVER 400 mg/dL 08/24/2016    Physical Findings: AIMS: Facial and Oral Movements Muscles of Facial Expression: None, normal Lips and Perioral Area: None, normal Jaw: None, normal Tongue: None, normal,Extremity Movements Upper (arms, wrists, hands, fingers): None, normal Lower (legs, knees, ankles, toes): None, normal, Trunk Movements Neck, shoulders, hips: None, normal, Overall Severity Severity of abnormal movements (highest score from questions above): None, normal Incapacitation due to abnormal movements: None, normal Patient's awareness of abnormal movements (rate only patient's report): No Awareness, Dental Status Current problems with teeth and/or dentures?: No Does patient usually wear dentures?: No  CIWA:  CIWA-Ar Total: 2 COWS:  COWS Total Score: 2  Musculoskeletal: Strength & Muscle Tone: within normal limits Gait & Station: normal Patient leans: N/A  Psychiatric Specialty Exam: Physical Exam  Constitutional: He is oriented to person, place, and time. He appears well-developed and well-nourished.  HENT:  Head: Normocephalic and atraumatic.  Eyes: Conjunctivae are normal. Pupils are equal, round, and reactive to light.  Neck: Normal range of motion. Neck supple.  Cardiovascular: Normal rate and regular rhythm.   Musculoskeletal: Normal range of motion.  Neurological: He is alert and oriented to person, place, and time.   Skin:  Skin is warm and dry.  Psychiatric:  As above    ROS  Blood pressure 137/84, pulse (!) 114, temperature 97.7 F (36.5 C), temperature source Oral, resp. rate 18, height 6' (1.829 m), weight 131.5 kg (290 lb), SpO2 100 %.Body mass index is 39.33 kg/m.  General Appearance: Pleasant, cooperative and engaging well. Broke down after we told him that his father is bent on not releasing the funds. Became tearful and that was when he expressed hopelessness.   Eye Contact:  Good  Speech:  Clear and Coherent  Volume:  Normal  Mood:  Overwhelmed by his financial situation  Affect:  Full range and appropriate.   Thought Process:  Linear  Orientation:  Full (Time, Place, and Person)  Thought Content:  Rumination  Suicidal Thoughts:  Off and on  Homicidal Thoughts:  Vague towards people that has hurt him over the years. Would scrap the list once his financial situation is sorted out.   Memory:  Did not assess  Judgement:  Poor  Insight:  Good  Psychomotor Activity:  Normal  Concentration:  WNL  Recall:  WNL  Fund of Knowledge:  Good  Language:  Good  Akathisia:  Negative  Handed:    AIMS (if indicated):     Assets:  Desire for Improvement Physical Health  ADL's:  Fair  Cognition:  WNL  Sleep:  Number of Hours: 5.25     Treatment Plan Summary: Patient is not pervasively depressed. He is interactive but rapidly shut down when his hope of getting funds soon did not seem realistic. No evidence of psychosis. No evidence of mania. Hopelessness in context of psychosocial constraints. Non specific homicidal and suicidal thoughts.  We hope to have a family meeting and discuss practical solutions to his stressors.   Psychiatric: Schizoaffective Disorder Adjustment disorder with depressed mood Alcohol Use Disorder  Medical:  Psychosocial:  Unemployed No confiding relationship Financial constraints Recent loss of own apartment and belongings.   PLAN: 1. Continue current  medication regimen 2. SW would set up a family meeting 66. Continue to monitor mood, behavior and interaction with peers    Artist [place], MD 08/25/2016, 6:45 PMPatient ID: Leon Mason, male   DOB: December 26, 1990, 26 y.o.   MRN: [id number]

## 2016-08-25 NOTE — Progress Notes (Signed)
Pt complained of dizziness and weakness on his legs. V/S taken bp 126/86, p. 98. Pt had not eaten lunch, food and fluids offered, pt requested to rest in bed and advised to seat beside bed for a few minutes before getting up to afford falls. Will continue to monitor.

## 2016-08-25 NOTE — Plan of Care (Signed)
Problem: Coping: Goal: Ability to cope will improve Pt endorse SI, but seek out nursing help before acting on his thoughts.

## 2016-08-25 NOTE — Progress Notes (Signed)
Recreation Therapy Notes  Date: 08/25/16 Time: 0930 Location: 300 [address] Dayroom  Group Topic: Stress Management  Goal Area(s) Addresses:  Patient will verbalize importance of using healthy stress management.  Patient will identify positive emotions associated with healthy stress management.   Behavioral Response: Engaged  Intervention: Stress Management  Activity :  Web designer.  LRT introduced the stress management technique of guided imagery.  LRT read a script to allow patients to follow along and participate in the activity.  Patients were to follow along as the script was read to engage in the activity.   Education:  Stress Management, Discharge Planning.   Education Outcome: Acknowledges edcuation/In group clarification offered/Needs additional education  Clinical Observations/Feedback: Pt attended group.   Victorino Sparrow, LRT/CTRS        Victorino Sparrow A 08/25/2016 12:25 PM

## 2016-08-26 MED ORDER — HYDROXYZINE HCL 25 MG PO TABS
ORAL_TABLET | ORAL | Status: AC
  Filled 2016-08-26: qty 1

## 2016-08-26 MED ORDER — HYDROXYZINE HCL 25 MG PO TABS
25.0000 mg | ORAL_TABLET | Freq: Four times a day (QID) | ORAL | Status: DC | PRN
  Administered 2016-08-26 – 2016-08-29 (×11): 25 mg via ORAL
  Filled 2016-08-26 (×11): qty 1

## 2016-08-26 NOTE — Plan of Care (Signed)
Problem: Education: Goal: Verbalization of understanding the information provided will improve Outcome: Progressing Patient verbalizes understanding of information, education provided however would benefit from reinforcement, re-education.  Problem: Safety: Goal: Periods of time without injury will increase Outcome: Progressing Patient has not engaged in self harm. Endorsing passive SI however verbal contract for safety is in place.

## 2016-08-26 NOTE — [facility] Group Notes (Signed)
[facility] LCSW Group Therapy  08/26/2016 1:17 PM  Type of Therapy:  Group Therapy  Participation Level:  Did Not Attend-1:1 meeting with [name] LCSW during group time/excused.   Summary of Progress/Problems: Today's Topic: Overcoming Obstacles. Patients identified one short term goal and potential obstacles in reaching this goal. Patients processed barriers involved in overcoming these obstacles. Patients identified steps necessary for overcoming these obstacles and explored motivation (internal and external) for facing these difficulties head on.   Leon Raynor N Smart LCSW 08/26/2016, 1:17 PM

## 2016-08-26 NOTE — Progress Notes (Signed)
Pt reports his day was "ok".  He denies SI/HI/AVH.  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  He says that his brother will not help him, and he is ready to kick him out of the house.  Pt has been observed in the dayroom playing cards and interacting appropriately on the unit.  He was given prn meds at bedtime for anxiety and sleep.  Pt voices no other needs at this time.  He makes his needs known to staff.  Support and encouragement offered.  Discharge plans are in process.  Pt plans to return home at discharge.  Safety maintained with q15 minute checks.

## 2016-08-26 NOTE — Progress Notes (Signed)
[facility] MD Progress Note  08/26/2016 1:33 PM DERK DOUBEK  MRN:  [id number] Subjective:   26 yo Caucasian single, recently lost his job and his apartment. Has been living with his mom and brother. Background history of Alcohol Use Disorder and Schizoaffective disorder. Presented to the ER after he called 911. Had thoughts of driving his car off the bridge. Upset with his father because his father is not sensitive to his financial crisis. Has $30,000 trust that he is cannot assess until the next four years. Wanted to get $2000 off it but his father refused. UDS and BAL were negative. Other parameters were within normal limits.   Chart reviewed today. Patient discussed at team  Staff reports that he continues to be focused on getting money from his funds. He has been discussing his person issues with peers. He has requested staff calls his aunt so that she can put some pressure on his father to release the money. Reported to have been loud on the phone while communicating with family members. Not withdrawn. Grooms self. Eats and sleeps well at night. Has been adherent with his medications.  SW has made repeated calls to his father. Calls have not been picked and SW has not been able to leave a message. Team would explore any other supportive resource in the community.   Seen today. Says he feels disappointed with his father. Says he has fought hard to get him onboard. Says he hates to say that his father might never come on board. Says he hoped enough pressure would have made him bulge. Says he is disappointed that all this effort might not yield the result he desired. Says he would have to consider other ways he could sustain himself. Asked me how he could get unemployment benefits. Says another option would be to go back and take care of his 60 yo mom. This seems to be his long term plan. Says he hopes to get paid as her care giver and maybe get some deduction from his taxes on that account. Says his  brother has not been helpful. Says himself and his mother plans to kick his brother out of the house.  Patient says he has no intention to harm anyone. Says he hs no intention to kill himself. Says he would do what he has to do until he gets of age to draw his trust fund. No evidence of psychosis. No evidence of mania. Not pervasively down. I explored ability to tolerate a room mate. Patient feels okay with that.  Principal Problem: Schizoaffective disorder, bipolar type ([place]) Diagnosis:   Patient Active Problem List   Diagnosis Date Noted  . Schizoaffective disorder, bipolar type ([place]) [F25.0] 08/23/2016  . Alcohol use disorder, severe, dependence ([place]) [F10.20] 08/23/2016  . Allergic rhinitis due to pollen [J30.1] 10/09/2015  . 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 Mild intermittent asthma [J45.20] 10/09/2015  . Psychosis [F29] 06/14/2013   Total Time spent with patient: 20 minutes  Past Psychiatric History: As in H&P  Past Medical History:  Past Medical History:  Diagnosis Date  . Anxiety   . Asthma   . Bipolar 1 disorder ([place])   . Hallucinations   . Schizophrenia [facility])     Past Surgical History:  Procedure Laterality Date  . WISDOM TOOTH EXTRACTION  2009   all four   Family History:  Family History  Problem Relation Age of Onset  . Eczema Brother   . COPD Maternal Aunt   . Bipolar disorder  Mother   . Alcoholism Mother   . Asthma Neg Hx   . Allergic rhinitis Neg Hx   . Angioedema Neg Hx   . Urticaria Neg Hx   . Immunodeficiency Neg Hx   . Atopy Neg Hx    Family Psychiatric  History: As in H&P Social History:  History  Alcohol Use  . 2.4 oz/week  . 4 Cans of beer per week    Comment: 4/5s brandy per week in the last week/ two weeks ago was drinking 8-10 beers daily     History  Drug Use No    Social History   Social History  . Marital status: Single    Spouse name: N/A  . Number of children: N/A  . Years of education: N/A   Social History Main  Topics  . Smoking status: Former Smoker    Packs/day: 0.25    Years: 0.50    Quit date: 09/08/2015  . Smokeless tobacco: Former Systems developer  . Alcohol use 2.4 oz/week    4 Cans of beer per week     Comment: 4/5s brandy per week in the last week/ two weeks ago was drinking 8-10 beers daily  . Drug use: No  . Sexual activity: Not Currently   Other Topics Concern  . None   Social History Narrative  . None   Additional Social History:    Pain Medications: See MAR Prescriptions: See MAR Over the Counter: See MAR History of alcohol / drug use?: Yes Longest period of sobriety (when/how long): Unknown Negative Consequences of Use: Personal relationships, Financial Name of Substance 1: Alcohol 1 - Age of First Use: 15 1 - Amount (size/oz): Five 12-ounce beers 1 - Frequency: Daily 1 - Duration: Ongoing 1 - Last Use / Amount: 08/21/16         Sleep: Good Appetite:  Better  Current Medications: Current Facility-Administered Medications  Medication Dose Route Frequency Provider Last Rate Last Dose  . acetaminophen (TYLENOL) tablet 650 mg  650 mg Oral Q6H PRN Okonkwo, Justina A, NP      . albuterol (PROVENTIL HFA;VENTOLIN HFA) 108 (90 Base) MCG/ACT inhaler 2 puff  2 puff Inhalation Q4H PRN Lindon Romp A, NP      . alum & mag hydroxide-simeth (MAALOX/MYLANTA) 200-200-20 MG/5ML suspension 30 mL  30 mL Oral Q4H PRN Okonkwo, Justina A, NP   30 mL at 08/23/16 1525  . benztropine (COGENTIN) tablet 1 mg  1 mg Oral Daily Okonkwo, Justina A, NP   1 mg at 08/26/16 0836  . gabapentin (NEURONTIN) capsule 100 mg  100 mg Oral TID Lu Duffel, Justina A, NP   100 mg at 08/26/16 1204  . hydrOXYzine (ATARAX/VISTARIL) tablet 25 mg  25 mg Oral Q6H PRN Ursula Alert, MD   25 mg at 08/26/16 1027  . lithium carbonate capsule 300 mg  300 mg Oral BID WC Okonkwo, Justina A, NP   300 mg at 08/26/16 0836  . loperamide (IMODIUM) capsule 2-4 mg  2-4 mg Oral PRN Eappen, Ria Clock, MD      . LORazepam (ATIVAN) tablet 1 mg   1 mg Oral Q6H PRN Ursula Alert, MD   1 mg at 08/24/16 0830  . LORazepam (ATIVAN) tablet 1 mg  1 mg Oral BID Ursula Alert, MD   1 mg at 08/26/16 0836   Followed by  . [START ON 08/27/2016] LORazepam (ATIVAN) tablet 1 mg  1 mg Oral Daily Eappen, Saramma, MD      . magnesium hydroxide (  MILK OF MAGNESIA) suspension 30 mL  30 mL Oral Daily PRN Okonkwo, Justina A, NP      . multivitamin with minerals tablet 1 tablet  1 tablet Oral Daily Eappen, Saramma, MD   1 tablet at 08/26/16 0836  . nicotine polacrilex (NICORETTE) gum 2 mg  2 mg Oral PRN Lindell Spar I, NP   2 mg at 08/26/16 0846  . nystatin cream (MYCOSTATIN)   Topical BID Derrill Center, NP      . ondansetron (ZOFRAN-ODT) disintegrating tablet 4 mg  4 mg Oral Q8H PRN Derrill Center, NP      . perphenazine (TRILAFON) tablet 4 mg  4 mg Oral TID Ursula Alert, MD   4 mg at 08/26/16 1204  . sertraline (ZOLOFT) tablet 150 mg  150 mg Oral Daily Eappen, Ria Clock, MD   150 mg at 08/26/16 0836  . thiamine (VITAMIN B-1) tablet 100 mg  100 mg Oral Daily Eappen, Ria Clock, MD   100 mg at 08/26/16 0836  . traZODone (DESYREL) tablet 50 mg  50 mg Oral QHS PRN Lu Duffel, Justina A, NP   50 mg at 08/25/16 2116    Lab Results:  No results found for this or any previous visit (from the past 48 hour(s)).  Blood Alcohol level:  Lab Results  Component Value Date   [facility] <5 08/22/2016   ETH <11 87/56/4332    Metabolic Disorder Labs: Lab Results  Component Value Date   HGBA1C 5.4 08/24/2016   MPG 108 08/24/2016   Lab Results  Component Value Date   PROLACTIN 25.4 (H) 08/24/2016   Lab Results  Component Value Date   CHOL 272 (H) 08/24/2016   TRIG 474 (H) 08/24/2016   HDL 33 (L) 08/24/2016   CHOLHDL 8.2 08/24/2016   VLDL UNABLE TO CALCULATE IF TRIGLYCERIDE OVER 400 mg/dL 08/24/2016   LDLCALC UNABLE TO CALCULATE IF TRIGLYCERIDE OVER 400 mg/dL 08/24/2016    Physical Findings: AIMS: Facial and Oral Movements Muscles of Facial Expression: None,  normal Lips and Perioral Area: None, normal Jaw: None, normal Tongue: None, normal,Extremity Movements Upper (arms, wrists, hands, fingers): None, normal Lower (legs, knees, ankles, toes): None, normal, Trunk Movements Neck, shoulders, hips: None, normal, Overall Severity Severity of abnormal movements (highest score from questions above): None, normal Incapacitation due to abnormal movements: None, normal Patient's awareness of abnormal movements (rate only patient's report): No Awareness, Dental Status Current problems with teeth and/or dentures?: No Does patient usually wear dentures?: No  CIWA:  CIWA-Ar Total: 1 COWS:  COWS Total Score: 2  Musculoskeletal: Strength & Muscle Tone: within normal limits Gait & Station: normal Patient leans: N/A  Psychiatric Specialty Exam: Physical Exam  Constitutional: He is oriented to person, place, and time. He appears well-developed and well-nourished.  HENT:  Head: Normocephalic and atraumatic.  Eyes: Conjunctivae are normal. Pupils are equal, round, and reactive to light.  Neck: Normal range of motion. Neck supple.  Cardiovascular: Normal rate and regular rhythm.   Musculoskeletal: Normal range of motion.  Neurological: He is alert and oriented to person, place, and time.  Skin: Skin is warm and dry.  Psychiatric:  As above    ROS  Blood pressure 126/75, pulse 77, temperature 97.9 F (36.6 C), temperature source Oral, resp. rate 18, height 6' (1.829 m), weight 131.5 kg (290 lb), SpO2 100 %.Body mass index is 39.33 kg/m.  General Appearance: Casually dressed, calm and cooperative. Dejected look.   Eye Contact:  Good  Speech:  Normal rate  and tone  Volume:  Normal  Mood:  Feels down   Affect:  Full range and appropriate.   Thought Process:  Linear and logical   Orientation:  Full (Time, Place, and Person)  Thought Content:  Focused on ways he could earn a living. No thoughts of violence. No hallucination in any modality. No  delusional theme.   Suicidal Thoughts:  None  Homicidal Thoughts:  None  Memory:  Did not assess  Judgement:  Fair  Insight:  Good  Psychomotor Activity:  Normal  Concentration:  WNL  Recall:  WNL  Fund of Knowledge:  Good  Language:  Good  Akathisia:  Negative  Handed:    AIMS (if indicated):     Assets:  Desire for Improvement Physical Health  ADL's:  Fair  Cognition:  WNL  Sleep:  Number of Hours: 4.25     Treatment Plan Summary: Patient is beginning to come to terms that he might not get money from his trust fund until he is of age. He is now processing ways of coping. He is no longer entertaining homicidal or suicidal thoughts. He feels he has no option but to return home soon if his father does not yield to his demands. He has not shown any evidence of psychosis or mania. We paln to pursue family input. hopeful discharge by weekend.   Psychiatric: Schizoaffective Disorder Adjustment disorder with depressed mood Alcohol Use Disorder  Medical:  Psychosocial:  Unemployed No confiding relationship Financial constraints Recent loss of own apartment and belongings.   PLAN: 1. Continue current medication regimen 2. SW would give patient information on unemployment benefit 3. Continue to monitor mood, behavior and interaction with peers 4. Can have a roommate   Artist [place], MD 08/26/2016, 1:33 PMPatient ID: [name], male   DOB: [birth year], 26 y.o.   MRN: [id number] Patient ID: HEYWOOD TOKUNAGA, male   DOB: 06/06/1990, 26 y.o.   MRN: [id number]

## 2016-08-26 NOTE — Progress Notes (Signed)
Pt was in the bed asleep at the beginning of the shift.  He got up shortly before 2000 confused, not realizing it was evening.  He was given his 1700 meds at that time that had been moved by the previous nurse.  Pt has been sleeping a lot during the day while here and getting up during the night, several times coming to the nurse's station asking for snacks.  Pt has been told that snacks are discouraged at night, although on occasion if needed a pt will be given one snack.  Pt can get really irritable when things do not go his way.  He has also threatened to hurt others when he gets angry.  Writer spoke to the [facility] who agreed pt needs the "No roommate" order reinstated as this is a precaution to keep others safe on the unit.  Pt contracts for safety at this time.  Support and encouragement offered.  Discharge plans are in process.  Safety maintained with q15 minute checks.

## 2016-08-26 NOTE — Progress Notes (Addendum)
D: Spoke with patient 1:1 who is up and interacting on unit. Patient loud and distracting at times. Observed yelling, cursing and arguing on phone. Discussing frustrations loudly with peers in dayroom. Patient's affect animated, anxious with congruent mood. Patient continues to ruminate about money issues with family. States he cannot work as someone needs to care for his alcoholic mother and "my brother doesn't help at all. He works like 50 hours a week and doesn't care. I don't know why my dad won't give me the money. He's holding one thing against me; that I spent some money 10 years ago. If I don't get the money I don't know what I'll do. I feel like hurting someone and myself." Patient denies plan and verbally contracts for safety with this writer, "I'll come talk to you. I promise I will." "But if I leave, those thoughts will be overwhelming." Patient asking staff to call his aunt to "convince her to convince my dad to give me the money" Patient states he cannot call himself because "I will mess it up. I'll say the wrong things. Can't you tell her I'm in a bad way and need the money?" Patient rating depression at a 4/10, hopelessness at an 8/10 and anxiety at a 2/10. Rates sleep as poor, appetite as good, energy as hyper and concentration as good.  States goal for today is to "become financially secure when I am discharged. Talk with my brother, aunt and social worker." Denies pain, requesting nicotine gum for cravings.   A: Medicated per orders, prn nicorette and vistaril given x 2. Level III obs in place for safety. Emotional support offered and processed with patient at length. Self inventory reviewed and patient strongly encouraged to complete Suicide Safety Plan. Also encouraged programming participation but patient resistant. Suggested available coping skills as well but patient states, "I just need to get my mind right." Discussed POC with MD, SW.  R: Patient verbalizes understanding of POC. On  reassess, patient's nicotine cravings decreased as is anxiety. Will continue to encourage utilization of coping strategies. Patient verbalizes contract for safety and states he will not act on his thoughts. Patient remains safe on level III obs. Will continue to monitor closely with frequent contact.

## 2016-08-27 NOTE — Progress Notes (Signed)
Recreation Therapy Notes  Date: 08/27/16 Time: 0930 Location: 300 [address] Dayroom  Group Topic: Stress Management  Goal Area(s) Addresses:  Patient will verbalize importance of using healthy stress management.  Patient will identify positive emotions associated with healthy stress management.   Behavioral Response: Engaged  Intervention: Stress Management  Activity : Progressive Muscle Relaxation.  LRT introduced the stress management technique of progressive muscle relaxation.  LRT read a script to guide patients through the process of pmr so they could get the full scope of the technique.  Patients were to follow along as the script was read to fully engage in technique.   Education:  Stress Management, Discharge Planning.   Education Outcome: Acknowledges edcuation/In group clarification offered/Needs additional education  Clinical Observations/Feedback: Pt attended group.   Victorino Sparrow, LRT/CTRS        Victorino Sparrow A 08/27/2016 11:14 AM

## 2016-08-27 NOTE — Tx Team (Signed)
Interdisciplinary Treatment and Diagnostic Plan Update  08/27/2016 Time of Session: 0930 Leon Mason MRN: [id number]  Principal Diagnosis: Schizoaffective disorder, bipolar type [facility])  Secondary Diagnoses: Principal Problem:   Schizoaffective disorder, bipolar type ([place]) Active Problems:   Alcohol use disorder, severe, dependence ([place])   Current Medications:  Current Facility-Administered Medications  Medication Dose Route Frequency Provider Last Rate Last Dose  . acetaminophen (TYLENOL) tablet 650 mg  650 mg Oral Q6H PRN Okonkwo, Justina A, NP      . albuterol (PROVENTIL HFA;VENTOLIN HFA) 108 (90 Base) MCG/ACT inhaler 2 puff  2 puff Inhalation Q4H PRN Lindon Romp A, NP      . alum & mag hydroxide-simeth (MAALOX/MYLANTA) 200-200-20 MG/5ML suspension 30 mL  30 mL Oral Q4H PRN Okonkwo, Justina A, NP   30 mL at 08/23/16 1525  . benztropine (COGENTIN) tablet 1 mg  1 mg Oral Daily Okonkwo, Justina A, NP   1 mg at 08/27/16 0752  . gabapentin (NEURONTIN) capsule 100 mg  100 mg Oral TID Lu Duffel, Justina A, NP   100 mg at 08/27/16 1144  . hydrOXYzine (ATARAX/VISTARIL) tablet 25 mg  25 mg Oral Q6H PRN Lindell Spar I, NP   25 mg at 08/27/16 1052  . lithium carbonate capsule 300 mg  300 mg Oral BID WC Okonkwo, Justina A, NP   300 mg at 08/27/16 0751  . magnesium hydroxide (MILK OF MAGNESIA) suspension 30 mL  30 mL Oral Daily PRN Okonkwo, Justina A, NP      . multivitamin with minerals tablet 1 tablet  1 tablet Oral Daily Ursula Alert, MD   1 tablet at 08/27/16 0751  . nicotine polacrilex (NICORETTE) gum 2 mg  2 mg Oral PRN Lindell Spar I, NP   2 mg at 08/27/16 0754  . nystatin cream (MYCOSTATIN)   Topical BID Derrill Center, NP      . ondansetron (ZOFRAN-ODT) disintegrating tablet 4 mg  4 mg Oral Q8H PRN Derrill Center, NP      . perphenazine (TRILAFON) tablet 4 mg  4 mg Oral TID Ursula Alert, MD   4 mg at 08/27/16 1144  . sertraline (ZOLOFT) tablet 150 mg  150 mg Oral Daily Eappen,  Ria Clock, MD   150 mg at 08/27/16 0751  . thiamine (VITAMIN B-1) tablet 100 mg  100 mg Oral Daily Eappen, Saramma, MD   100 mg at 08/27/16 0751  . traZODone (DESYREL) tablet 50 mg  50 mg Oral QHS PRN Okonkwo, Justina A, NP   50 mg at 08/26/16 2300   PTA Medications: Prescriptions Prior to Admission  Medication Sig Dispense Refill Last Dose  . albuterol (PROVENTIL HFA;VENTOLIN HFA) 108 (90 BASE) MCG/ACT inhaler Inhale 2 puffs into the lungs every 4 (four) hours as needed for wheezing or shortness of breath.    08/20/2016  . calcium carbonate (TUMS - DOSED IN MG ELEMENTAL CALCIUM) 500 MG chewable tablet Chew 2 tablets by mouth daily.   08/20/2016  . gabapentin (NEURONTIN) 100 MG capsule Take 100 mg by mouth 3 (three) times daily.   08/18/2016  . lithium carbonate 300 MG capsule Take 300 mg by mouth 2 (two) times daily with a meal.    08/18/2016  . loratadine (CLARITIN) 10 MG tablet Take 10 mg by mouth daily.   08/21/2016  . perphenazine (TRILAFON) 8 MG tablet Take 12 mg by mouth at bedtime.   0 08/18/2016  . sertraline (ZOLOFT) 100 MG tablet Take 150 mg by mouth at bedtime.  0 08/18/2016  . benztropine (COGENTIN) 1 MG tablet Take 1 tablet (1 mg total) by mouth daily. (Patient not taking: Reported on 08/23/2016) 30 tablet 0 Not Taking at Unknown time  . famotidine (PEPCID) 20 MG tablet Take 1 tablet (20 mg total) by mouth 2 (two) times daily. (Patient not taking: Reported on 08/23/2016) 30 tablet 0 Not Taking at Unknown time  . methocarbamol (ROBAXIN) 500 MG tablet Take 1 tablet (500 mg total) by mouth 2 (two) times daily. (Patient not taking: Reported on 08/23/2016) 20 tablet 0 Not Taking at Unknown time    Patient Stressors: Financial difficulties Health problems Substance abuse  Patient Strengths: Ability for insight Average or above average intelligence Motivation for treatment/growth  Treatment Modalities: Medication Management, Group therapy, Case management,  1 to 1 session with clinician,  Psychoeducation, Recreational therapy.   Physician Treatment Plan for Primary Diagnosis: Schizoaffective disorder, bipolar type ([place]) Long Term Goal(s): Improvement in symptoms so as ready for discharge Improvement in symptoms so as ready for discharge   Short Term Goals: Ability to identify changes in lifestyle to reduce recurrence of condition will improve Ability to verbalize feelings will improve Ability to disclose and discuss suicidal ideas Ability to identify and develop effective coping behaviors will improve Ability to disclose and discuss suicidal ideas Ability to demonstrate self-control will improve Ability to maintain clinical measurements within normal limits will improve Ability to identify triggers associated with substance abuse/mental health issues will improve  Medication Management: Evaluate patient's response, side effects, and tolerance of medication regimen.  Therapeutic Interventions: 1 to 1 sessions, Unit Group sessions and Medication administration.  Evaluation of Outcomes: Progressing  Physician Treatment Plan for Secondary Diagnosis: Principal Problem:   Schizoaffective disorder, bipolar type ([place]) Active Problems:   Alcohol use disorder, severe, dependence ([place])  Long Term Goal(s): Improvement in symptoms so as ready for discharge Improvement in symptoms so as ready for discharge   Short Term Goals: Ability to identify changes in lifestyle to reduce recurrence of condition will improve Ability to verbalize feelings will improve Ability to disclose and discuss suicidal ideas Ability to identify and develop effective coping behaviors will improve Ability to disclose and discuss suicidal ideas Ability to demonstrate self-control will improve Ability to maintain clinical measurements within normal limits will improve Ability to identify triggers associated with substance abuse/mental health issues will improve     Medication Management: Evaluate  patient's response, side effects, and tolerance of medication regimen.  Therapeutic Interventions: 1 to 1 sessions, Unit Group sessions and Medication administration.  Evaluation of Outcomes: Progressing   RN Treatment Plan for Primary Diagnosis: Schizoaffective disorder, bipolar type ([place]) Long Term Goal(s): Knowledge of disease and therapeutic regimen to maintain health will improve  Short Term Goals: Ability to remain free from injury will improve, Ability to disclose and discuss suicidal ideas and Ability to identify and develop effective coping behaviors will improve  Medication Management: RN will administer medications as ordered by provider, will assess and evaluate patient's response and provide education to patient for prescribed medication. RN will report any adverse and/or side effects to prescribing provider.  Therapeutic Interventions: 1 on 1 counseling sessions, Psychoeducation, Medication administration, Evaluate responses to treatment, Monitor vital signs and CBGs as ordered, Perform/monitor CIWA, COWS, AIMS and Fall Risk screenings as ordered, Perform wound care treatments as ordered.  Evaluation of Outcomes: Progressing   LCSW Treatment Plan for Primary Diagnosis: Schizoaffective disorder, bipolar type ([place]) Long Term Goal(s): Safe transition to appropriate next level of care at  discharge, Engage patient in therapeutic group addressing interpersonal concerns.  Short Term Goals: Engage patient in aftercare planning with referrals and resources, Facilitate patient progression through stages of change regarding substance use diagnoses and concerns and Identify triggers associated with mental health/substance abuse issues  Therapeutic Interventions: Assess for all discharge needs, 1 to 1 time with Social worker, Explore available resources and support systems, Assess for adequacy in community support network, Educate family and significant other(s) on suicide prevention,  Complete Psychosocial Assessment, Interpersonal group therapy.  Evaluation of Outcomes: Progressing   Progress in Treatment: Attending groups: Yes. Participating in groups: Yes. Taking medication as prescribed: Yes. Toleration medication: Yes. Family/Significant other contact made: SPE completed with pt's father. Collateral information also obtained.  Patient understands diagnosis: Yes. Discussing patient identified problems/goals with staff: Yes. Medical problems stabilized or resolved: Yes. Denies suicidal/homicidal ideation: Yes. Issues/concerns per patient self-inventory: No. Other: n/a   New problem(s) identified: No, Describe:  n/a  New Short Term/Long Term Goal(s): SI; depressive symptoms, medication stabilization, development of comprehensive mental wellness/sobriety plan.   Discharge Plan or Barriers: CSW assessing--pt plans to return home with his brother and mother. "I am her caretaker." Pt would like to resume services with his current providers at [facility]. [facility]-therapy and [employer] for medication management.   Reason for Continuation of Hospitalization: Depression Medication stabilization Withdrawal symptoms  Estimated Length of Stay: tentative discharge on 08/30/16 (Monday)   Attendees: Patient: 08/27/2016 3:48 PM  Physician: Dr. Sanjuana Letters MD 08/27/2016 3:48 PM  Nursing: Nira Conn RN 08/27/2016 3:48 PM  RN Care Manager: Lars Pinks CM 08/27/2016 3:48 PM  Social Worker: Maxie Better, LCSW 08/27/2016 3:48 PM  Recreational Therapist: x 08/27/2016 3:48 PM  Other: Lindell Spar NP 08/27/2016 3:48 PM  Other:  08/27/2016 3:48 PM  Other: 08/27/2016 3:48 PM    Scribe for Treatment Team: [place], LCSW 08/27/2016 3:48 PM

## 2016-08-27 NOTE — Progress Notes (Signed)
Psychoeducational Group Note  Date:  08/27/2016 Time:  2240  Group Topic/Focus:  Wrap-Up Group:   The focus of this group is to help patients review their daily goal of treatment and discuss progress on daily workbooks.  Participation Level: Did Not Attend  Participation Quality:  Not Applicable  Affect:  Not Applicable  Cognitive:  Not Applicable  Insight:  Not Applicable  Engagement in Group: Not Applicable  Additional Comments:  The patient did not attend group this evening since he was asleep.   [name] 08/27/2016, 10:40 PM

## 2016-08-27 NOTE — Plan of Care (Signed)
Problem: Self-Concept: Goal: Level of anxiety will decrease Outcome: Progressing While patient continues to express feeling anxious, it is decreasing as he rates it a 1/10.

## 2016-08-27 NOTE — Progress Notes (Addendum)
D: Spoke with patient 1:1 who is up and visible on the unit. Interactive with peers and less boisterous . Patient's affect flat, anxious with congruent mood. Rating depression at a 2/10, hopelessness at a 3/10 and anxiety at a 1/10. Rates sleep as fair, appetite as good, energy as hyper and concentration as good.  States goal for today is to "cope with not receiving my demands. Develop a list of positive lifestyle choices to practice." Patient states to this writer and indicates on his self inventory, "I have come to terms with the possibility that my father will nto meet my demands." Patient states, "I am hoping my father will agree to a family meeting, an intervention sort of thing, but if he doesn't I know I need to implement Plan B. Denies pain, physical problems however does request nicotine replacement due to cravings.   A: Medicated per orders, prn vistaril given for anxiety as well as nicorette gum. Level III obs in place for safety. Emotional support offered and self inventory reviewed. Encouraged completion of Suicide Safety Plan and programming participation. Discussed POC with MD, SW.   R: Patient verbalizes understanding of POC. On reassess, patient reports decrease in anxiety, nicotine craving. Patient denies SI/HI/AVH and remains safe on level III obs. No roommate order lifted as patient no longer having unsafe thoughts. Verbal contract in place for safety.

## 2016-08-27 NOTE — Progress Notes (Signed)
D.  Pt in bed at beginning of shift, no distress noted, respirations even and unlabored.  Per note from RN last night will monitor patient to see how he does for night before putting room mate into room.  It was written that Pt was up and down most of night and becomes angry and homicidal easily.  To maintain Pt safety the no roommate order was reinstated for tonight to be sure this has resolved appropriately.  NP and AC in agreement with this.  A.  Support and encouragement offered  R.  Pt remains safe on unit, will continue to monitor.

## 2016-08-27 NOTE — Progress Notes (Signed)
[facility] MD Progress Note  08/27/2016 5:43 PM Leon Mason  MRN:  [id number] Subjective:   26 yo Caucasian single, recently lost his job and his apartment. Has been living with his mom and brother. Background history of Alcohol Use Disorder and Schizoaffective disorder. Presented to the ER after he called 911. Had thoughts of driving his car off the bridge. Upset with his father because his father is not sensitive to his financial crisis. Has $30,000 trust that he is cannot assess until the next four years. Wanted to get $2000 off it but his father refused. UDS and BAL were negative. Other parameters were within normal limits.   Chart reviewed today. Patient discussed at team  Nursing staff reports that patient has been appropriate on the unit. Patient has been interacting well with peers. No behavioral issues. Patient has not voiced any suicidal thoughts. Patient has not been observed to be internally stimulated. Patient has been adherent with treatment recommendations. Patient has been tolerating their medication well.    Seen today.tells me that he got in touch with his father's wife. Says she assured her that they would get in touch with [place] by Monday. Says he is optimistic as she is leaning towards helping him. Patient says he feels relieved. Says all the distress he was feeling has disappeared. He is very hopeful and says he plans to get his life back on track. No evidence of depression. No evidence of mania. No evidence of psychosis.   Principal Problem: Schizoaffective disorder, bipolar type ([place]) Diagnosis:   Patient Active Problem List   Diagnosis Date Noted  . Schizoaffective disorder, bipolar type ([place]) [F25.0] 08/23/2016  . Alcohol use disorder, severe, dependence ([place]) [F10.20] 08/23/2016  . Allergic rhinitis due to pollen [J30.1] 10/09/2015  . 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 Mild intermittent asthma [J45.20] 10/09/2015  . Psychosis [F29] 06/14/2013   Total Time spent  with patient: 20 minutes  Past Psychiatric History: As in H&P  Past Medical History:  Past Medical History:  Diagnosis Date  . Anxiety   . Asthma   . Bipolar 1 disorder ([place])   . Hallucinations   . Schizophrenia [facility])     Past Surgical History:  Procedure Laterality Date  . WISDOM TOOTH EXTRACTION  2009   all four   Family History:  Family History  Problem Relation Age of Onset  . Eczema Brother   . COPD Maternal Aunt   . Bipolar disorder Mother   . Alcoholism Mother   . Asthma Neg Hx   . Allergic rhinitis Neg Hx   . Angioedema Neg Hx   . Urticaria Neg Hx   . Immunodeficiency Neg Hx   . Atopy Neg Hx    Family Psychiatric  History: As in H&P Social History:  History  Alcohol Use  . 2.4 oz/week  . 4 Cans of beer per week    Comment: 4/5s brandy per week in the last week/ two weeks ago was drinking 8-10 beers daily     History  Drug Use No    Social History   Social History  . Marital status: Single    Spouse name: N/A  . Number of children: N/A  . Years of education: N/A   Social History Main Topics  . Smoking status: Former Smoker    Packs/day: 0.25    Years: 0.50    Quit date: 09/08/2015  . Smokeless tobacco: Former Systems developer  . Alcohol use 2.4 oz/week    4 Cans of  beer per week     Comment: 4/5s brandy per week in the last week/ two weeks ago was drinking 8-10 beers daily  . Drug use: No  . Sexual activity: Not Currently   Other Topics Concern  . None   Social History Narrative  . None   Additional Social History:    Pain Medications: See MAR Prescriptions: See MAR Over the Counter: See MAR History of alcohol / drug use?: Yes Longest period of sobriety (when/how long): Unknown Negative Consequences of Use: Personal relationships, Financial Name of Substance 1: Alcohol 1 - Age of First Use: 15 1 - Amount (size/oz): Five 12-ounce beers 1 - Frequency: Daily 1 - Duration: Ongoing 1 - Last Use / Amount: 08/21/16         Sleep:  Good Appetite:  Better  Current Medications: Current Facility-Administered Medications  Medication Dose Route Frequency Provider Last Rate Last Dose  . acetaminophen (TYLENOL) tablet 650 mg  650 mg Oral Q6H PRN Okonkwo, Justina A, NP      . albuterol (PROVENTIL HFA;VENTOLIN HFA) 108 (90 Base) MCG/ACT inhaler 2 puff  2 puff Inhalation Q4H PRN Lindon Romp A, NP      . alum & mag hydroxide-simeth (MAALOX/MYLANTA) 200-200-20 MG/5ML suspension 30 mL  30 mL Oral Q4H PRN Okonkwo, Justina A, NP   30 mL at 08/23/16 1525  . benztropine (COGENTIN) tablet 1 mg  1 mg Oral Daily Okonkwo, Justina A, NP   1 mg at 08/27/16 0752  . gabapentin (NEURONTIN) capsule 100 mg  100 mg Oral TID Lu Duffel, Justina A, NP   100 mg at 08/27/16 1643  . hydrOXYzine (ATARAX/VISTARIL) tablet 25 mg  25 mg Oral Q6H PRN Lindell Spar I, NP   25 mg at 08/27/16 1052  . lithium carbonate capsule 300 mg  300 mg Oral BID WC Okonkwo, Justina A, NP   300 mg at 08/27/16 1643  . magnesium hydroxide (MILK OF MAGNESIA) suspension 30 mL  30 mL Oral Daily PRN Okonkwo, Justina A, NP      . multivitamin with minerals tablet 1 tablet  1 tablet Oral Daily Ursula Alert, MD   1 tablet at 08/27/16 0751  . nicotine polacrilex (NICORETTE) gum 2 mg  2 mg Oral PRN Lindell Spar I, NP   2 mg at 08/27/16 1644  . nystatin cream (MYCOSTATIN)   Topical BID Derrill Center, NP      . ondansetron (ZOFRAN-ODT) disintegrating tablet 4 mg  4 mg Oral Q8H PRN Derrill Center, NP      . perphenazine (TRILAFON) tablet 4 mg  4 mg Oral TID Ursula Alert, MD   4 mg at 08/27/16 1643  . sertraline (ZOLOFT) tablet 150 mg  150 mg Oral Daily Eappen, Ria Clock, MD   150 mg at 08/27/16 0751  . thiamine (VITAMIN B-1) tablet 100 mg  100 mg Oral Daily Eappen, Saramma, MD   100 mg at 08/27/16 0751  . traZODone (DESYREL) tablet 50 mg  50 mg Oral QHS PRN Okonkwo, Justina A, NP   50 mg at 08/26/16 2300    Lab Results:  No results found for this or any previous visit (from the past  48 hour(s)).  Blood Alcohol level:  Lab Results  Component Value Date   [facility] <5 08/22/2016   ETH <11 86/76/1951    Metabolic Disorder Labs: Lab Results  Component Value Date   HGBA1C 5.4 08/24/2016   MPG 108 08/24/2016   Lab Results  Component Value Date  PROLACTIN 25.4 (H) 08/24/2016   Lab Results  Component Value Date   CHOL 272 (H) 08/24/2016   TRIG 474 (H) 08/24/2016   HDL 33 (L) 08/24/2016   CHOLHDL 8.2 08/24/2016   VLDL UNABLE TO CALCULATE IF TRIGLYCERIDE OVER 400 mg/dL 08/24/2016   LDLCALC UNABLE TO CALCULATE IF TRIGLYCERIDE OVER 400 mg/dL 08/24/2016    Physical Findings: AIMS: Facial and Oral Movements Muscles of Facial Expression: None, normal Lips and Perioral Area: None, normal Jaw: None, normal Tongue: None, normal,Extremity Movements Upper (arms, wrists, hands, fingers): None, normal Lower (legs, knees, ankles, toes): None, normal, Trunk Movements Neck, shoulders, hips: None, normal, Overall Severity Severity of abnormal movements (highest score from questions above): None, normal Incapacitation due to abnormal movements: None, normal Patient's awareness of abnormal movements (rate only patient's report): No Awareness, Dental Status Current problems with teeth and/or dentures?: No Does patient usually wear dentures?: No  CIWA:  CIWA-Ar Total: 1 COWS:  COWS Total Score: 2  Musculoskeletal: Strength & Muscle Tone: within normal limits Gait & Station: normal Patient leans: N/A  Psychiatric Specialty Exam: Physical Exam  Constitutional: He is oriented to person, place, and time. He appears well-developed and well-nourished.  HENT:  Head: Normocephalic and atraumatic.  Eyes: Conjunctivae are normal. Pupils are equal, round, and reactive to light.  Neck: Normal range of motion. Neck supple.  Cardiovascular: Normal rate and regular rhythm.   Musculoskeletal: Normal range of motion.  Neurological: He is alert and oriented to person, place, and time.   Skin: Skin is warm and dry.  Psychiatric:  As above    ROS  Blood pressure 123/70, pulse 90, temperature 98.2 F (36.8 C), temperature source Oral, resp. rate 18, height 6' (1.829 m), weight 131.5 kg (290 lb), SpO2 100 %.Body mass index is 39.33 kg/m.  General Appearance: Calm and cooperative. Appropriate behavior.   Eye Contact:  Good  Speech:  Normal rate and tone  Volume:  Normal  Mood:  Euthymic  Affect:  Full range and appropriate.   Thought Process:  Linear and logical   Orientation:  Full (Time, Place, and Person)  Thought Content:  Future oriented. No delusional theme. No preoccupation with violent thoughts. No negative ruminations. No obsession.  No hallucination in any modality.   Suicidal Thoughts:  No  Homicidal Thoughts:  No  Memory:  WNL  Judgement:  Good  Insight:  Good  Psychomotor Activity:  Normal  Concentration:  WNL  Recall:  WNL  Fund of Knowledge:  Good  Language:  Good  Akathisia:  Negative  Handed:    AIMS (if indicated):     Assets:  Desire for Improvement Physical Health  ADL's:  Fair  Cognition:  WNL  Sleep:  Number of Hours: 5.75     Treatment Plan Summary: Patient is very hopeful. He is not depressed. He is not manic or psychotic. He is optimistic about the future. We hope to have a famil session on [place] and hopefully discharge him afterwards.   Psychiatric: Schizoaffective Disorder Adjustment disorder with depressed mood Alcohol Use Disorder  Medical:  Psychosocial:  Unemployed No confiding relationship Financial constraints Recent loss of own apartment and belongings.   PLAN: 1. Continue current medication regimen 2. Continue to monitor mood, behavior and interaction with peers    [place], MD 08/27/2016, 5:43 PMPatient ID: [name], male   DOB: 06-05-1990, 26 y.o.   MRN: [id number] Patient ID: HASHIR DELEEUW, male   DOB: 03-24-1990, 26 y.o.  MRN: [id number] Patient ID: GURPREET MIKHAIL, male   DOB:  1990-07-01, 26 y.o.   MRN: [id number]

## 2016-08-27 NOTE — BHH Group Notes (Signed)
[place] LCSW Group Therapy  08/27/2016 3:48 PM  Type of Therapy:  Group Therapy  Participation Level:  Active  Participation Quality:  Attentive  Affect:  Appropriate  Cognitive:  Alert and Oriented  Insight:  Improving  Engagement in Therapy:  Improving  Modes of Intervention:  Confrontation, Discussion, Education, Socialization and Support  Summary of Progress/Problems: Feelings around Relapse. Group members discussed the meaning of relapse and shared personal stories of relapse, how it affected them and others, and how they perceived themselves during this time. Group members were encouraged to identify triggers, warning signs and coping skills used when facing the possibility of relapse. Social supports were discussed and explored in detail. Post Acute Withdrawal Syndrome (handout provided) was introduced and examined. Pt's were encouraged to ask questions, talk about key points associated with PAWS, and process this information in terms of relapse prevention.   [place] Relic Smart LCSW 08/27/2016, 3:48 PM

## 2016-08-28 LAB — LITHIUM LEVEL: LITHIUM LVL: 0.13 mmol/L — AB (ref 0.60–1.20)

## 2016-08-28 MED ORDER — TRAZODONE HCL 50 MG PO TABS
50.0000 mg | ORAL_TABLET | Freq: Every evening | ORAL | Status: DC | PRN
  Administered 2016-08-28 – 2016-08-29 (×3): 50 mg via ORAL
  Filled 2016-08-28 (×4): qty 1

## 2016-08-28 NOTE — [facility] Group Notes (Signed)
  [place] LCSW Group Therapy Note  08/28/2016  and  10:15 AM  Type of Therapy and Topic:  Group Therapy: Avoiding Self-Sabotaging and Enabling Behaviors  Participation Level:  Active  Participation Quality:  Drowsy  Affect:  Depressed and Flat  Cognitive:  Alert and Oriented  Insight:  Developing  Engagement in Therapy:  Limited   Therapeutic models used: Cognitive Behavioral Therapy,  Person-Centered Therapy and Motivational Interviewing  Modes of Intervention:  Discussion, Exploration, Orientation, Rapport Building, Socialization and Support   Summary of Progress/Problems:  The main focus of today's process group was for the patient to identify ways in which they have in the past sabotaged their own recovery. Motivational Interviewing was utilized to identify motivation they may have for wanting to change. The Stages of Change were explained using a handout, and patients identified where they currently are with regard to stages of change. Patient reports he is in contemplation stage of developing new supports   Sheilah Pigeon, LCSW

## 2016-08-28 NOTE — Progress Notes (Signed)
Up numerous times requesting medication for sleep, states has been sleeping during day and can not sleep at night.

## 2016-08-28 NOTE — Progress Notes (Signed)
D.  Pt appears anxious on approach, denies complaints at this time other than difficulty sleeping at night.  Pt was positive for evening AA group, minimal interaction observed with peers on unit.  Pt denies SI/HI/AVH at this time.  A.  Support and encouragement offered, made Pt aware of repeat dose of Trazodone available until 2 AM.  R.  Pt remains safe on the unit, will continue to monitor.

## 2016-08-28 NOTE — Progress Notes (Signed)
[facility] MD Progress Note  08/28/2016 2:31 PM Leon Mason  MRN:  [id number] Subjective:   26 yo Caucasian single, recently lost his job and his apartment. Has been living with his mom and brother. Background history of Alcohol Use Disorder and Schizoaffective disorder. Presented to the ER after he called 911. Had thoughts of driving his car off the bridge. Upset with his father because his father is not sensitive to his financial crisis. Has $30,000 trust that he is cannot assess until the next four years. Wanted to get $2000 off it but his father refused. UDS and BAL were negative. Other parameters were within normal limits.   Chart reviewed today. Patient discussed at team  Staff reports that he has been pleasant. He has been interacting well with peers. He has not voiced any futility thoughts. He has not been observed to be internally stimulated. He has been taking his medications as prescribed. No side effects.   Seen today. In good spirits. Remains optimistic about the future. No new developments. Not depressed. No evidence of psychosis. No evidence of mania. Not having any suicidal or homicidal thoughts. No craving for substances.   Principal Problem: Schizoaffective disorder, bipolar type ([place]) Diagnosis:   Patient Active Problem List   Diagnosis Date Noted  . Schizoaffective disorder, bipolar type ([place]) [F25.0] 08/23/2016  . Alcohol use disorder, severe, dependence ([place]) [F10.20] 08/23/2016  . Allergic rhinitis due to pollen [J30.1] 10/09/2015  . 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 Mild intermittent asthma [J45.20] 10/09/2015  . Psychosis [F29] 06/14/2013   Total Time spent with patient: 20 minutes  Past Psychiatric History: As in H&P  Past Medical History:  Past Medical History:  Diagnosis Date  . Anxiety   . Asthma   . Bipolar 1 disorder ([place])   . Hallucinations   . Schizophrenia [facility])     Past Surgical History:  Procedure Laterality Date  . WISDOM TOOTH  EXTRACTION  2009   all four   Family History:  Family History  Problem Relation Age of Onset  . Eczema Brother   . COPD Maternal Aunt   . Bipolar disorder Mother   . Alcoholism Mother   . Asthma Neg Hx   . Allergic rhinitis Neg Hx   . Angioedema Neg Hx   . Urticaria Neg Hx   . Immunodeficiency Neg Hx   . Atopy Neg Hx    Family Psychiatric  History: As in H&P Social History:  History  Alcohol Use  . 2.4 oz/week  . 4 Cans of beer per week    Comment: 4/5s brandy per week in the last week/ two weeks ago was drinking 8-10 beers daily     History  Drug Use No    Social History   Social History  . Marital status: Single    Spouse name: N/A  . Number of children: N/A  . Years of education: N/A   Social History Main Topics  . Smoking status: Former Smoker    Packs/day: 0.25    Years: 0.50    Quit date: 09/08/2015  . Smokeless tobacco: Former Systems developer  . Alcohol use 2.4 oz/week    4 Cans of beer per week     Comment: 4/5s brandy per week in the last week/ two weeks ago was drinking 8-10 beers daily  . Drug use: No  . Sexual activity: Not Currently   Other Topics Concern  . None   Social History Narrative  . None   Additional Social  History:    Pain Medications: See MAR Prescriptions: See MAR Over the Counter: See MAR History of alcohol / drug use?: Yes Longest period of sobriety (when/how long): Unknown Negative Consequences of Use: Personal relationships, Financial Name of Substance 1: Alcohol 1 - Age of First Use: 15 1 - Amount (size/oz): Five 12-ounce beers 1 - Frequency: Daily 1 - Duration: Ongoing 1 - Last Use / Amount: 08/21/16         Sleep: Good Appetite:  Better  Current Medications: Current Facility-Administered Medications  Medication Dose Route Frequency Provider Last Rate Last Dose  . acetaminophen (TYLENOL) tablet 650 mg  650 mg Oral Q6H PRN Okonkwo, Justina A, NP      . albuterol (PROVENTIL HFA;VENTOLIN HFA) 108 (90 Base) MCG/ACT inhaler  2 puff  2 puff Inhalation Q4H PRN Lindon Romp A, NP      . alum & mag hydroxide-simeth (MAALOX/MYLANTA) 200-200-20 MG/5ML suspension 30 mL  30 mL Oral Q4H PRN Okonkwo, Justina A, NP   30 mL at 08/23/16 1525  . benztropine (COGENTIN) tablet 1 mg  1 mg Oral Daily Okonkwo, Justina A, NP   1 mg at 08/28/16 0807  . gabapentin (NEURONTIN) capsule 100 mg  100 mg Oral TID Lu Duffel, Justina A, NP   100 mg at 08/28/16 1213  . hydrOXYzine (ATARAX/VISTARIL) tablet 25 mg  25 mg Oral Q6H PRN Lindell Spar I, NP   25 mg at 08/28/16 1213  . lithium carbonate capsule 300 mg  300 mg Oral BID WC Okonkwo, Justina A, NP   300 mg at 08/28/16 8315  . magnesium hydroxide (MILK OF MAGNESIA) suspension 30 mL  30 mL Oral Daily PRN Okonkwo, Justina A, NP      . multivitamin with minerals tablet 1 tablet  1 tablet Oral Daily Ursula Alert, MD   1 tablet at 08/28/16 0821  . nicotine polacrilex (NICORETTE) gum 2 mg  2 mg Oral PRN Lindell Spar I, NP   2 mg at 08/28/16 1213  . nystatin cream (MYCOSTATIN)   Topical BID Derrill Center, NP      . ondansetron (ZOFRAN-ODT) disintegrating tablet 4 mg  4 mg Oral Q8H PRN Derrill Center, NP      . perphenazine (TRILAFON) tablet 4 mg  4 mg Oral TID Ursula Alert, MD   4 mg at 08/28/16 1214  . sertraline (ZOLOFT) tablet 150 mg  150 mg Oral Daily Eappen, Ria Clock, MD   150 mg at 08/28/16 0806  . thiamine (VITAMIN B-1) tablet 100 mg  100 mg Oral Daily Eappen, Ria Clock, MD   100 mg at 08/28/16 1761  . traZODone (DESYREL) tablet 50 mg  50 mg Oral QHS PRN,MR X 1 Rozetta Nunnery, NP        Lab Results:  Results for orders placed or performed during the hospital encounter of 08/22/16 (from the past 48 hour(s))  Lithium level     Status: Abnormal   Collection Time: 08/28/16  6:25 AM  Result Value Ref Range   Lithium Lvl 0.13 (L) 0.60 - 1.20 mmol/L    Comment: Performed at [facility] [place] [address], [place], [place] [address]    Blood Alcohol level:  Lab Results   Component Value Date   [facility] <5 08/22/2016   ETH <11 10/62/6948    Metabolic Disorder Labs: Lab Results  Component Value Date   HGBA1C 5.4 08/24/2016   MPG 108 08/24/2016   Lab Results  Component Value Date  PROLACTIN 25.4 (H) 08/24/2016   Lab Results  Component Value Date   CHOL 272 (H) 08/24/2016   TRIG 474 (H) 08/24/2016   HDL 33 (L) 08/24/2016   CHOLHDL 8.2 08/24/2016   VLDL UNABLE TO CALCULATE IF TRIGLYCERIDE OVER 400 mg/dL 08/24/2016   LDLCALC UNABLE TO CALCULATE IF TRIGLYCERIDE OVER 400 mg/dL 08/24/2016    Physical Findings: AIMS: Facial and Oral Movements Muscles of Facial Expression: None, normal Lips and Perioral Area: None, normal Jaw: None, normal Tongue: None, normal,Extremity Movements Upper (arms, wrists, hands, fingers): None, normal Lower (legs, knees, ankles, toes): None, normal, Trunk Movements Neck, shoulders, hips: None, normal, Overall Severity Severity of abnormal movements (highest score from questions above): None, normal Incapacitation due to abnormal movements: None, normal Patient's awareness of abnormal movements (rate only patient's report): No Awareness, Dental Status Current problems with teeth and/or dentures?: No Does patient usually wear dentures?: No  CIWA:  CIWA-Ar Total: 1 COWS:  COWS Total Score: 2  Musculoskeletal: Strength & Muscle Tone: within normal limits Gait & Station: normal Patient leans: N/A  Psychiatric Specialty Exam: Physical Exam  Constitutional: He is oriented to person, place, and time. He appears well-developed and well-nourished.  HENT:  Head: Normocephalic and atraumatic.  Eyes: Conjunctivae are normal. Pupils are equal, round, and reactive to light.  Neck: Normal range of motion. Neck supple.  Cardiovascular: Normal rate and regular rhythm.   Musculoskeletal: Normal range of motion.  Neurological: He is alert and oriented to person, place, and time.  Skin: Skin is warm and dry.  Psychiatric:  As  above    ROS  Blood pressure 118/84, pulse 93, temperature 97.7 F (36.5 C), resp. rate 18, height 6' (1.829 m), weight 131.5 kg (290 lb), SpO2 100 %.Body mass index is 39.33 kg/m.  General Appearance: Calm and cooperative. Engaging and relating well. Appropriate behavior.   Eye Contact:  Good  Speech:  Normal rate and tone  Volume:  Normal  Mood:  Euthymic  Affect:  Full range and appropriate.   Thought Process:  Linear and logical   Orientation:  Full (Time, Place, and Person)  Thought Content:  Future oriented. No delusional theme. No preoccupation with violent thoughts. No negative ruminations. No obsession.  No hallucination in any modality.   Suicidal Thoughts:  No  Homicidal Thoughts:  No  Memory:  WNL  Judgement:  Good  Insight:  Good  Psychomotor Activity:  Normal  Concentration:  WNL  Recall:  WNL  Fund of Knowledge:  Good  Language:  Good  Akathisia:  Negative  Handed:    AIMS (if indicated):     Assets:  Desire for Improvement Physical Health  ADL's:  Fair  Cognition:  WNL  Sleep:  Number of Hours: 6.25     Treatment Plan Summary: Patient is not depressed. He is not manic or psychotic. He is hopeful about resolution of his current situation. We would collaborate with his family on Monday and hopefully discharge him on Monday.    Psychiatric: Schizoaffective Disorder ,,,, stable Adjustment disorder with depressed mood Alcohol Use Disorder  Medical:  Psychosocial:  Unemployed No confiding relationship Financial constraints Recent loss of own apartment and belongings.   PLAN: 1. Continue current medication regimen 2. Continue to monitor mood, behavior and interaction with peers    [place]ist [place], MD 08/28/2016, 2:31 PMPatient ID: Leon Mason, male   DOB: 1990-03-22, 27 y.o.   MRN: [id number] Patient ID: Leon Mason, male   DOB: [birth year], 25  y.o.   MRN: [id number] Patient ID: Leon Mason, male   DOB: 1990/12/24, 26 y.o.   MRN:  [id number] Patient ID: Leon Mason, male   DOB: 09-30-1990, 26 y.o.   MRN: [id number]

## 2016-08-28 NOTE — Progress Notes (Signed)
D: Patient's self inventory sheet: patient has poor sleep, recieved sleep medication.good  Appetite, high energy level, good concentration. Rated depression 1-2/10, hopeless 1-2/10, anxiety 2/10. SI/HI/AVH: Denies all. Physical complaints are denied. Goal is "communicating with my family to set up a meeting date". Plans to work on "call people as necessary".Patient reports anxiety and displayed anxious preoccupation with laboratory tests; reassured that STD testing was negative and discussed other labs such as lipids that were elevated.    A: Medications administered, assessed medication knowledge and education given on medication regimen.  Emotional support and encouragement given patient. R: Denies SI and HI , contracts for safety. Safety maintained with 15 minute checks.

## 2016-08-28 NOTE — [facility] Group Notes (Signed)
[place] Group Notes:  (Nursing/MHT/Case Management/Adjunct)  Date:  08/28/2016  Time:  3:00 PM  Type of Therapy:  Nurse Education  Participation Level:  Active  Participation Quality:  Appropriate and Attentive  Affect:  Appropriate  Cognitive:  Alert and Appropriate  Insight:  Appropriate  Engagement in Group:  Engaged  Modes of Intervention:  Activity, Discussion and Education  Summary of Progress/Problems: Today's group focused on different perspectives. Patient's were asked to identify an irrational thought. Pictures with different illusions were then passed around and patient's were asked to reveal what they saw.  A conversation was had about how in every situation there are many ways to look at the same problem and how we always have a choice to see things in a different perspective.  At the end of the group, patient's were asked to repeat their irrational thought and replace it with a different positive statement.  Leon Mason attended and actively participated in group and offered support to peers. [name] P 08/28/2016, 3:00 PM

## 2016-08-28 NOTE — Progress Notes (Signed)
Patient did attend the evening speaker AA meeting.  

## 2016-08-29 MED ORDER — PNEUMOCOCCAL VAC POLYVALENT 25 MCG/0.5ML IJ INJ
0.5000 mL | INJECTION | INTRAMUSCULAR | Status: AC
  Administered 2016-08-29: 0.5 mL via INTRAMUSCULAR

## 2016-08-29 NOTE — Progress Notes (Signed)
Patient did attend the evening speaker AA meeting.  

## 2016-08-29 NOTE — Progress Notes (Signed)
D.  Pt pleasant but anxious on approach, Pt has been trying to reach his father on the phone and has thus far been unable to do so.  Pt would like family meeting.  Pt states that at night he has been waking up frequently startled with heart racing.  He wonders if it could be his medication or if it could be the coffee he is drinking.  Pt was positive for evening AA group and was seen to be interacting appropriately with the AA representatives on the unit.  Pt denies SI/HI/AVH at this time.  A.  Support and encouragement offered, medication given as ordered.  Advised to avoid coffee before bedtime, will discuss medications with NP   R.  Pt remains safe on the unit, will continue to monitor.

## 2016-08-29 NOTE — Plan of Care (Signed)
Problem: Education: Goal: Emotional status will improve Outcome: Progressing Reports optimism, states that depression and anxiety are minimal Goal: Mental status will improve Outcome: Progressing Denies current hallucinations and psychotic symptoms  Problem: Coping: Goal: Ability to verbalize frustrations and anger appropriately will improve Outcome: Not Progressing Continues to blame parents for the difficulties in his life.  Goal: Ability to demonstrate self-control will improve Outcome: Progressing No behavioral dyscontrol exhibited  Problem: Self-Concept: Goal: Level of anxiety will decrease Outcome: Progressing Reports anxiety level of 3; affect, facial expression and activity display anxious mood.  Problem: Health Behavior/Discharge Planning: Goal: Identification of resources available to assist in meeting health care needs will improve Outcome: Progressing Adequate discharge plans verbalized

## 2016-08-29 NOTE — [facility] Group Notes (Signed)
The focus of this group is to educate the patient on the purpose and policies of crisis stabilization and provide a format to answer questions about their admission.  The group details unit policies and expectations of patients while admitted.  Patient did not attend 0900 nurse education orientation group this morning.  Patient stayed in bed.   

## 2016-08-29 NOTE — Progress Notes (Addendum)
[facility] MD Progress Note  08/29/2016 2:31 PM Leon Mason  MRN:  [id number] Subjective:   26 yo Caucasian single, recently lost his job and his apartment. Has been living with his mom and brother. Background history of Alcohol Use Disorder and Schizoaffective disorder. Presented to the ER after he called 911. Had thoughts of driving his car off the bridge. Upset with his father because his father is not sensitive to his financial crisis. Has $30,000 trust that he is cannot assess until the next four years. Wanted to get $2000 off it but his father refused. UDS and BAL were negative. Other parameters were within normal limits.   Chart reviewed today. Patient discussed at team  Staff reports that he has been doing well. Not withdrawn. Has expressed a lot of hope for his family to come on board. Not internally stimulated.   Seen today. In good form. Was playing chess with a peer. Says he is not sure his family would show up tomorrow. Says he has made up his mind to move on and take care of his mom. No thoughts of suicide. No thoughts of homicide. No thoughts of violence. No evidence of depression. No evidence of psychosis. No evidence of mania.   Principal Problem: Schizoaffective disorder, bipolar type ([place]) Diagnosis:   Patient Active Problem List   Diagnosis Date Noted  . Schizoaffective disorder, bipolar type ([place]) [F25.0] 08/23/2016  . Alcohol use disorder, severe, dependence ([place]) [F10.20] 08/23/2016  . Allergic rhinitis due to pollen [J30.1] 10/09/2015  . 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 Mild intermittent asthma [J45.20] 10/09/2015  . Psychosis [F29] 06/14/2013   Total Time spent with patient: 20 minutes  Past Psychiatric History: As in H&P  Past Medical History:  Past Medical History:  Diagnosis Date  . Anxiety   . Asthma   . Bipolar 1 disorder ([place])   . Hallucinations   . Schizophrenia [facility])     Past Surgical History:  Procedure Laterality Date  . WISDOM TOOTH  EXTRACTION  2009   all four   Family History:  Family History  Problem Relation Age of Onset  . Eczema Brother   . COPD Maternal Aunt   . Bipolar disorder Mother   . Alcoholism Mother   . Asthma Neg Hx   . Allergic rhinitis Neg Hx   . Angioedema Neg Hx   . Urticaria Neg Hx   . Immunodeficiency Neg Hx   . Atopy Neg Hx    Family Psychiatric  History: As in H&P Social History:  History  Alcohol Use  . 2.4 oz/week  . 4 Cans of beer per week    Comment: 4/5s brandy per week in the last week/ two weeks ago was drinking 8-10 beers daily     History  Drug Use No    Social History   Social History  . Marital status: Single    Spouse name: N/A  . Number of children: N/A  . Years of education: N/A   Social History Main Topics  . Smoking status: Former Smoker    Packs/day: 0.25    Years: 0.50    Quit date: 09/08/2015  . Smokeless tobacco: Former Systems developer  . Alcohol use 2.4 oz/week    4 Cans of beer per week     Comment: 4/5s brandy per week in the last week/ two weeks ago was drinking 8-10 beers daily  . Drug use: No  . Sexual activity: Not Currently   Other Topics Concern  .  None   Social History Narrative  . None   Additional Social History:    Pain Medications: See MAR Prescriptions: See MAR Over the Counter: See MAR History of alcohol / drug use?: Yes Longest period of sobriety (when/how long): Unknown Negative Consequences of Use: Personal relationships, Financial Name of Substance 1: Alcohol 1 - Age of First Use: 15 1 - Amount (size/oz): Five 12-ounce beers 1 - Frequency: Daily 1 - Duration: Ongoing 1 - Last Use / Amount: 08/21/16         Sleep: Good Appetite:  Better  Current Medications: Current Facility-Administered Medications  Medication Dose Route Frequency Provider Last Rate Last Dose  . acetaminophen (TYLENOL) tablet 650 mg  650 mg Oral Q6H PRN Okonkwo, Justina A, NP      . albuterol (PROVENTIL HFA;VENTOLIN HFA) 108 (90 Base) MCG/ACT inhaler  2 puff  2 puff Inhalation Q4H PRN [place] Romp A, NP      . alum & mag hydroxide-simeth (MAALOX/MYLANTA) 200-200-20 MG/5ML suspension 30 mL  30 mL Oral Q4H PRN Okonkwo, Justina A, NP   30 mL at 08/23/16 1525  . benztropine (COGENTIN) tablet 1 mg  1 mg Oral Daily Okonkwo, Justina A, NP   1 mg at 08/29/16 0804  . gabapentin (NEURONTIN) capsule 100 mg  100 mg Oral TID Lu Duffel, Justina A, NP   100 mg at 08/29/16 1132  . hydrOXYzine (ATARAX/VISTARIL) tablet 25 mg  25 mg Oral Q6H PRN Lindell Spar I, NP   25 mg at 08/29/16 0934  . lithium carbonate capsule 300 mg  300 mg Oral BID WC Okonkwo, Justina A, NP   300 mg at 08/29/16 0804  . magnesium hydroxide (MILK OF MAGNESIA) suspension 30 mL  30 mL Oral Daily PRN Okonkwo, Justina A, NP      . multivitamin with minerals tablet 1 tablet  1 tablet Oral Daily Ursula Alert, MD   1 tablet at 08/29/16 0805  . nicotine polacrilex (NICORETTE) gum 2 mg  2 mg Oral PRN [place]ell Spar I, NP   2 mg at 08/29/16 1410  . nystatin cream (MYCOSTATIN)   Topical BID Derrill Center, NP      . ondansetron (ZOFRAN-ODT) disintegrating tablet 4 mg  4 mg Oral Q8H PRN Derrill Center, NP      . perphenazine (TRILAFON) tablet 4 mg  4 mg Oral TID Ursula Alert, MD   4 mg at 08/29/16 1129  . sertraline (ZOLOFT) tablet 150 mg  150 mg Oral Daily Eappen, Ria Clock, MD   150 mg at 08/29/16 0804  . thiamine (VITAMIN B-1) tablet 100 mg  100 mg Oral Daily Eappen, Ria Clock, MD   100 mg at 08/29/16 0806  . traZODone (DESYREL) tablet 50 mg  50 mg Oral QHS PRN,MR X 1 Lindon Romp A, NP   50 mg at 08/29/16 0114    Lab Results:  Results for orders placed or performed during the hospital encounter of 08/22/16 (from the past 48 hour(s))  Lithium level     Status: Abnormal   Collection Time: 08/28/16  6:25 AM  Result Value Ref Range   Lithium Lvl 0.13 (L) 0.60 - 1.20 mmol/L    Comment: Performed at [facility], [place] [address] [place], [place] [address]    Blood Alcohol  level:  Lab Results  Component Value Date   [facility] <5 08/22/2016   ETH <11 10/93/2355    Metabolic Disorder Labs: Lab Results  Component Value Date   HGBA1C 5.4  08/24/2016   MPG 108 08/24/2016   Lab Results  Component Value Date   PROLACTIN 25.4 (H) 08/24/2016   Lab Results  Component Value Date   CHOL 272 (H) 08/24/2016   TRIG 474 (H) 08/24/2016   HDL 33 (L) 08/24/2016   CHOLHDL 8.2 08/24/2016   VLDL UNABLE TO CALCULATE IF TRIGLYCERIDE OVER 400 mg/dL 08/24/2016   LDLCALC UNABLE TO CALCULATE IF TRIGLYCERIDE OVER 400 mg/dL 08/24/2016    Physical Findings: AIMS: Facial and Oral Movements Muscles of Facial Expression: None, normal Lips and Perioral Area: None, normal Jaw: None, normal Tongue: None, normal,Extremity Movements Upper (arms, wrists, hands, fingers): None, normal Lower (legs, knees, ankles, toes): None, normal, Trunk Movements Neck, shoulders, hips: None, normal, Overall Severity Severity of abnormal movements (highest score from questions above): None, normal Incapacitation due to abnormal movements: None, normal Patient's awareness of abnormal movements (rate only patient's report): No Awareness, Dental Status Current problems with teeth and/or dentures?: No Does patient usually wear dentures?: No  CIWA:  CIWA-Ar Total: 1 COWS:  COWS Total Score: 2  Musculoskeletal: Strength & Muscle Tone: within normal limits Gait & Station: normal Patient leans: N/A  Psychiatric Specialty Exam: Physical Exam  Constitutional: He is oriented to person, place, and time. He appears well-developed and well-nourished.  HENT:  Head: Normocephalic and atraumatic.  Eyes: Conjunctivae are normal. Pupils are equal, round, and reactive to light.  Neck: Normal range of motion. Neck supple.  Cardiovascular: Normal rate and regular rhythm.   Musculoskeletal: Normal range of motion.  Neurological: He is alert and oriented to person, place, and time.  Skin: Skin is warm and dry.   Psychiatric:  As above    ROS  Blood pressure 130/69, pulse 86, temperature 97.8 F (36.6 C), temperature source Oral, resp. rate 20, height 6' (1.829 m), weight 131.5 kg (290 lb), SpO2 100 %.Body mass index is 39.33 kg/m.  General Appearance: Neatly dressed, pleasant, engaging well and cooperative. Appropriate behavior. Not in any distress. Good relatedness. Not internally stimulated   Eye Contact:  Good  Speech: Spontaneous, normal prosody. Normal tone and rate.   Volume:  Normal  Mood:  Euthymic  Affect:  Full range and appropriate.   Thought Process:  Linear and logical   Orientation:  Full (Time, Place, and Person)  Thought Content:  Future oriented. No delusional theme. No preoccupation with violent thoughts. No negative ruminations. No obsession.  No hallucination in any modality.   Suicidal Thoughts:  No  Homicidal Thoughts:  No  Memory:  WNL  Judgement:  Good  Insight:  Good  Psychomotor Activity:  Normal  Concentration:  WNL  Recall:  WNL  Fund of Knowledge:  Good  Language:  Good  Akathisia:  Negative  Handed:    AIMS (if indicated):     Assets:  Desire for Improvement Physical Health  ADL's:  Fair  Cognition:  WNL  Sleep:  Number of Hours: 5.75     Treatment Plan Summary: Patient is not depressed. He is not manic or psychotic. He is hopeful about resolution of his current situation. We would collaborate with his family on Monday and hopefully discharge him on Monday.    Psychiatric: Schizoaffective Disorder ,,,, stable Adjustment disorder with depressed mood Alcohol Use Disorder  Medical:  Psychosocial:  Unemployed No confiding relationship Financial constraints Recent loss of own apartment and belongings.   PLAN: 1. Continue current medication regimen 2. Continue to monitor mood, behavior and interaction with peers    Evette Doffing A  Sanjuana Letters, MD 08/29/2016, 2:31 PMPatient ID: [name], male   DOB: 06-14-1990, 26 y.o.   MRN: [id number] Patient  ID: DECKLAN MAU, male   DOB: 1991-01-19, 26 y.o.   MRN: [id number] Patient ID: ETHANAEL VEITH, male   DOB: Oct 16, 1990, 26 y.o.   MRN: [id number] Patient ID: CHANDLER SWIDERSKI, male   DOB: March 06, 1991, 26 y.o.   MRN: [id number] Patient ID: CHORD TAKAHASHI, male   DOB: 05/13/1990, 26 y.o.   MRN: [id number]

## 2016-08-29 NOTE — BHH Group Notes (Signed)
     [place] LCSW Group Therapy  08/29/2016 10 AM  Type of Therapy:  Group Therapy  Participation Level:  Active  Participation Quality:  Appropriate  Affect:  Flat  Cognitive:  Alert and Oriented  Insight:  Improving  Engagement in Therapy:  Improving  Modes of Intervention:  Discussion, Education, Exploration, Problem-solving, Rapport Building, Socialization and Support  Summary of Progress/Problems: Topic for today was thoughts and feelings regarding discharge. We discussed fears of upcoming changes including judgements, expectations and stigma of mental health issues. We then discussed supports: what constitutes a supportive framework, identification of supports and what to do when others are not supportive. Patients then discussed different options to deal with anger. Leon Mason shared frustrations and asked for more options to deal with anger.   Sheilah Pigeon, LCSW

## 2016-08-30 MED ORDER — NYSTATIN 100000 UNIT/GM EX CREA: TOPICAL_CREAM | Freq: Two times a day (BID) | CUTANEOUS | 0 refills | Status: DC

## 2016-08-30 MED ORDER — GABAPENTIN 100 MG PO CAPS: 100.0000 mg | ORAL_CAPSULE | Freq: Three times a day (TID) | ORAL | 0 refills | Status: DC

## 2016-08-30 MED ORDER — PERPHENAZINE 4 MG PO TABS: 4.0000 mg | ORAL_TABLET | Freq: Three times a day (TID) | ORAL | 0 refills | Status: DC

## 2016-08-30 MED ORDER — NICOTINE POLACRILEX 2 MG MT GUM: 2.0000 mg | CHEWING_GUM | OROMUCOSAL | 0 refills | Status: DC | PRN

## 2016-08-30 MED ORDER — BENZTROPINE MESYLATE 1 MG PO TABS: 1.0000 mg | ORAL_TABLET | Freq: Every day | ORAL | 0 refills | Status: DC

## 2016-08-30 MED ORDER — HYDROXYZINE HCL 25 MG PO TABS: 25.0000 mg | ORAL_TABLET | Freq: Four times a day (QID) | ORAL | 0 refills | Status: DC | PRN

## 2016-08-30 MED ORDER — SERTRALINE HCL 50 MG PO TABS: 150.0000 mg | ORAL_TABLET | Freq: Every day | ORAL | 0 refills | Status: DC

## 2016-08-30 MED ORDER — LITHIUM CARBONATE 300 MG PO CAPS: 300.0000 mg | ORAL_CAPSULE | Freq: Two times a day (BID) | ORAL | 0 refills | Status: DC

## 2016-08-30 MED ORDER — TRAZODONE HCL 50 MG PO TABS: 50.0000 mg | ORAL_TABLET | Freq: Every evening | ORAL | 0 refills | Status: DC | PRN

## 2016-08-30 NOTE — Progress Notes (Signed)
  [facility] Adult Case Management Discharge Plan :  Will you be returning to the same living situation after discharge:  Yes,  home with mother/brother At discharge, do you have transportation home?: Yes,  family member Do you have the ability to pay for your medications: Yes,  [employer] private insurance  Release of information consent forms completed and  Submitted to medical records by CSW.  Patient to Follow up at: Follow-up Information    Group, [facility] up on 08/31/2016.   Specialty:  Behavioral Health Why:  at 3:00pm for therapy with [facility] Medication management on 7/16 at 10:40am with Wayland Denis. Contact information: [place] [address] [phone]           Next level of care provider has access to [place]: No.   Safety Planning and Suicide Prevention discussed: Yes,  SPE completed with pt's father/collateral information also obtained. SPI pamphlet and Mobile Crisis information provided to pt.   Have you used any form of tobacco in the last 30 days? (Cigarettes, Smokeless Tobacco, Cigars, and/or Pipes): Yes  Has patient been referred to the Quitline?: Patient refused referral  Patient has been referred for addiction treatment: Yes  Raphel Stickles N Smart LCSW 08/30/2016, 10:46 AM

## 2016-08-30 NOTE — Tx Team (Signed)
Interdisciplinary Treatment and Diagnostic Plan Update  08/30/2016 Time of Session: 0930 JANES COLEGROVE MRN: [id number]  Principal Diagnosis: Schizoaffective disorder, bipolar type [facility])  Secondary Diagnoses: Principal Problem:   Schizoaffective disorder, bipolar type ([place]) Active Problems:   Alcohol use disorder, severe, dependence ([place])   Current Medications:  Current Facility-Administered Medications  Medication Dose Route Frequency Provider Last Rate Last Dose  . acetaminophen (TYLENOL) tablet 650 mg  650 mg Oral Q6H PRN Okonkwo, Justina A, NP      . albuterol (PROVENTIL HFA;VENTOLIN HFA) 108 (90 Base) MCG/ACT inhaler 2 puff  2 puff Inhalation Q4H PRN Lindon Romp A, NP      . alum & mag hydroxide-simeth (MAALOX/MYLANTA) 200-200-20 MG/5ML suspension 30 mL  30 mL Oral Q4H PRN Okonkwo, Justina A, NP   30 mL at 08/23/16 1525  . benztropine (COGENTIN) tablet 1 mg  1 mg Oral Daily Okonkwo, Justina A, NP   1 mg at 08/30/16 0818  . gabapentin (NEURONTIN) capsule 100 mg  100 mg Oral TID Lu Duffel, Justina A, NP   100 mg at 08/30/16 0823  . hydrOXYzine (ATARAX/VISTARIL) tablet 25 mg  25 mg Oral Q6H PRN Lindell Spar I, NP   25 mg at 08/29/16 2111  . lithium carbonate capsule 300 mg  300 mg Oral BID WC Okonkwo, Justina A, NP   300 mg at 08/30/16 0818  . magnesium hydroxide (MILK OF MAGNESIA) suspension 30 mL  30 mL Oral Daily PRN Okonkwo, Justina A, NP      . multivitamin with minerals tablet 1 tablet  1 tablet Oral Daily Eappen, Saramma, MD   1 tablet at 08/30/16 0816  . nicotine polacrilex (NICORETTE) gum 2 mg  2 mg Oral PRN Lindell Spar I, NP   2 mg at 08/29/16 1854  . nystatin cream (MYCOSTATIN)   Topical BID Derrill Center, NP      . ondansetron (ZOFRAN-ODT) disintegrating tablet 4 mg  4 mg Oral Q8H PRN Derrill Center, NP      . perphenazine (TRILAFON) tablet 4 mg  4 mg Oral TID Ursula Alert, MD   4 mg at 08/30/16 0818  . sertraline (ZOLOFT) tablet 150 mg  150 mg Oral Daily Eappen,  Ria Clock, MD   150 mg at 08/30/16 0817  . thiamine (VITAMIN B-1) tablet 100 mg  100 mg Oral Daily Eappen, Ria Clock, MD   100 mg at 08/30/16 0817  . traZODone (DESYREL) tablet 50 mg  50 mg Oral QHS PRN,MR X 1 Lindon Romp A, NP   50 mg at 08/29/16 2111   PTA Medications: Prescriptions Prior to Admission  Medication Sig Dispense Refill Last Dose  . albuterol (PROVENTIL HFA;VENTOLIN HFA) 108 (90 BASE) MCG/ACT inhaler Inhale 2 puffs into the lungs every 4 (four) hours as needed for wheezing or shortness of breath.    08/20/2016  . calcium carbonate (TUMS - DOSED IN MG ELEMENTAL CALCIUM) 500 MG chewable tablet Chew 2 tablets by mouth daily.   08/20/2016  . gabapentin (NEURONTIN) 100 MG capsule Take 100 mg by mouth 3 (three) times daily.   08/18/2016  . lithium carbonate 300 MG capsule Take 300 mg by mouth 2 (two) times daily with a meal.    08/18/2016  . loratadine (CLARITIN) 10 MG tablet Take 10 mg by mouth daily.   08/21/2016  . perphenazine (TRILAFON) 8 MG tablet Take 12 mg by mouth at bedtime.   0 08/18/2016  . sertraline (ZOLOFT) 100 MG tablet Take 150 mg by mouth  at bedtime.   0 08/18/2016  . benztropine (COGENTIN) 1 MG tablet Take 1 tablet (1 mg total) by mouth daily. (Patient not taking: Reported on 08/23/2016) 30 tablet 0 Not Taking at Unknown time  . famotidine (PEPCID) 20 MG tablet Take 1 tablet (20 mg total) by mouth 2 (two) times daily. (Patient not taking: Reported on 08/23/2016) 30 tablet 0 Not Taking at Unknown time  . methocarbamol (ROBAXIN) 500 MG tablet Take 1 tablet (500 mg total) by mouth 2 (two) times daily. (Patient not taking: Reported on 08/23/2016) 20 tablet 0 Not Taking at Unknown time    Patient Stressors: Financial difficulties Health problems Substance abuse  Patient Strengths: Ability for insight Average or above average intelligence Motivation for treatment/growth  Treatment Modalities: Medication Management, Group therapy, Case management,  1 to 1 session with clinician,  Psychoeducation, Recreational therapy.   Physician Treatment Plan for Primary Diagnosis: Schizoaffective disorder, bipolar type ([place]) Long Term Goal(s): Improvement in symptoms so as ready for discharge Improvement in symptoms so as ready for discharge   Short Term Goals: Ability to identify changes in lifestyle to reduce recurrence of condition will improve Ability to verbalize feelings will improve Ability to disclose and discuss suicidal ideas Ability to identify and develop effective coping behaviors will improve Ability to disclose and discuss suicidal ideas Ability to demonstrate self-control will improve Ability to maintain clinical measurements within normal limits will improve Ability to identify triggers associated with substance abuse/mental health issues will improve  Medication Management: Evaluate patient's response, side effects, and tolerance of medication regimen.  Therapeutic Interventions: 1 to 1 sessions, Unit Group sessions and Medication administration.  Evaluation of Outcomes: Adequate for discharge   Physician Treatment Plan for Secondary Diagnosis: Principal Problem:   Schizoaffective disorder, bipolar type ([place]) Active Problems:   Alcohol use disorder, severe, dependence ([place])  Long Term Goal(s): Improvement in symptoms so as ready for discharge Improvement in symptoms so as ready for discharge   Short Term Goals: Ability to identify changes in lifestyle to reduce recurrence of condition will improve Ability to verbalize feelings will improve Ability to disclose and discuss suicidal ideas Ability to identify and develop effective coping behaviors will improve Ability to disclose and discuss suicidal ideas Ability to demonstrate self-control will improve Ability to maintain clinical measurements within normal limits will improve Ability to identify triggers associated with substance abuse/mental health issues will improve     Medication Management:  Evaluate patient's response, side effects, and tolerance of medication regimen.  Therapeutic Interventions: 1 to 1 sessions, Unit Group sessions and Medication administration.  Evaluation of Outcomes: Adequate for discharge   RN Treatment Plan for Primary Diagnosis: Schizoaffective disorder, bipolar type ([place]) Long Term Goal(s): Knowledge of disease and therapeutic regimen to maintain health will improve  Short Term Goals: Ability to remain free from injury will improve, Ability to disclose and discuss suicidal ideas and Ability to identify and develop effective coping behaviors will improve  Medication Management: RN will administer medications as ordered by provider, will assess and evaluate patient's response and provide education to patient for prescribed medication. RN will report any adverse and/or side effects to prescribing provider.  Therapeutic Interventions: 1 on 1 counseling sessions, Psychoeducation, Medication administration, Evaluate responses to treatment, Monitor vital signs and CBGs as ordered, Perform/monitor CIWA, COWS, AIMS and Fall Risk screenings as ordered, Perform wound care treatments as ordered.  Evaluation of Outcomes: Adequate for discharge    LCSW Treatment Plan for Primary Diagnosis: Schizoaffective disorder, bipolar type ([place])  Long Term Goal(s): Safe transition to appropriate next level of care at discharge, Engage patient in therapeutic group addressing interpersonal concerns.  Short Term Goals: Engage patient in aftercare planning with referrals and resources, Facilitate patient progression through stages of change regarding substance use diagnoses and concerns and Identify triggers associated with mental health/substance abuse issues  Therapeutic Interventions: Assess for all discharge needs, 1 to 1 time with Social worker, Explore available resources and support systems, Assess for adequacy in community support network, Educate family and significant  other(s) on suicide prevention, Complete Psychosocial Assessment, Interpersonal group therapy.  Evaluation of Outcomes: Adequate for discharge    Progress in Treatment: Attending groups: Yes. Participating in groups: Yes. Taking medication as prescribed: Yes. Toleration medication: Yes. Family/Significant other contact made: SPE completed with pt's father. Collateral information also obtained.  Patient understands diagnosis: Yes. Discussing patient identified problems/goals with staff: Yes. Medical problems stabilized or resolved: Yes. Denies suicidal/homicidal ideation: Yes. Issues/concerns per patient self-inventory: No. Other: n/a   New problem(s) identified: No, Describe:  n/a  New Short Term/Long Term Goal(s): SI; depressive symptoms, medication stabilization, development of comprehensive mental wellness/sobriety plan.   Patient identified Goal(s): To get money from my dad and have family session/goal not met. New goal: to get back on medications and get follow-up in place. To feel better and less depressed.   Discharge Plan or Barriers: Pt has appt(s)  with his current providers at [facility]. [facility] and [employer] for medication management. He plans to return home with his mother/brother at discharge. Pt also provided with AA/NA pamphlets for [facility] and information to [facility].   Reason for Continuation of Hospitalization: none  Estimated Length of Stay: discharge on 08/30/16 (Monday)   Attendees: Patient: 08/30/2016 10:47 AM  Physician: Dr. Sanjuana Letters MD 08/30/2016 10:47 AM  Nursing: Sharyn Lull RN; [facility] RN 08/30/2016 10:47 AM  RN Care Manager: Lars Pinks CM 08/30/2016 10:47 AM  Social Worker: Maxie Better, LCSW 08/30/2016 10:47 AM  Recreational Therapist: x 08/30/2016 10:47 AM  Other: Ricky Ala NP 08/30/2016 10:47 AM  Other:  08/30/2016 10:47 AM  Other: 08/30/2016 10:47 AM    Scribe for Treatment Team: [place], LCSW 08/30/2016 10:47 AM
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
## 2016-08-30 NOTE — BHH Suicide Risk Assessment (Signed)
[facility] Discharge Suicide Risk Assessment   Principal Problem: Schizoaffective disorder, bipolar type [facility]) Discharge Diagnoses:  Patient Active Problem List   Diagnosis Date Noted  . Schizoaffective disorder, bipolar type ([place]) [F25.0] 08/23/2016  . Alcohol use disorder, severe, dependence ([place]) [F10.20] 08/23/2016  . Allergic rhinitis due to pollen [J30.1] 10/09/2015  . 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 Mild intermittent asthma [J45.20] 10/09/2015  . Psychosis [F29] 06/14/2013    Total Time spent with patient: 45 minutes  Musculoskeletal: Strength & Muscle Tone: within normal limits Gait & Station: normal Patient leans: N/A  Psychiatric Specialty Exam: Review of Systems  Constitutional: Negative.   HENT: Negative.   Eyes: Negative.   Respiratory: Negative.   Cardiovascular: Negative.   Gastrointestinal: Negative.   Genitourinary: Negative.   Musculoskeletal: Negative.   Skin: Negative.   Neurological: Negative.   Endo/Heme/Allergies: Negative.   Psychiatric/Behavioral: Negative for depression, hallucinations, memory loss, substance abuse and suicidal ideas. The patient is not nervous/anxious and does not have insomnia.     Blood pressure 122/73, pulse 91, temperature 97.7 F (36.5 C), temperature source Oral, resp. rate 16, height 6' (1.829 m), weight 131.5 kg (290 lb), SpO2 100 %.Body mass index is 39.33 kg/m.  General Appearance: Neatly dressed, pleasant, engaging well and cooperative. Appropriate behavior. Not in any distress. Good relatedness. Not internally stimulated  Eye Contact::  Good  Speech:  Spontaneous, normal prosody. Normal tone and rate.   Volume:  Normal  Mood:  Euthymic  Affect:  Appropriate and Full Range  Thought Process:  Goal Directed  Orientation:  Full (Time, Place, and Person)  Thought Content:  Future oriented. No delusional theme. No preoccupation with violent thoughts. No negative ruminations. No obsession.  No hallucination in any  modality.   Suicidal Thoughts:  No  Homicidal Thoughts:  No  Memory:  Immediate;   Good Recent;   Good Remote;   Good  Judgement:  Good  Insight:  Good  Psychomotor Activity:  Normal  Concentration:  Good  Recall:  Good  Fund of Knowledge:Good  Language: Good  Akathisia:  Negative  Handed:    AIMS (if indicated):     Assets:  Communication Skills Desire for Improvement Housing Physical Health  Sleep:  Number of Hours: 6.5  Cognition: WNL  ADL's:  Intact   Clinical Assessment::   26 yo Caucasian single, recently lost his job and his apartment. Has been living with his mom and brother. Background history of Alcohol Use Disorder and Schizoaffective disorder. Presented to the ER after he called 911. Had thoughts of driving his car off the bridge. Upset with his father because his father is not sensitive to his financial crisis. Has $30,000 trust that he is cannot assess until the next four years. Wanted to get $2000 off it but his father refused. UDS and BAL were negative. Other parameters were within normal limits.   Seen today. Says his family did not call back. Not surprised that they did not leave any messages for [place] over the weekend. Says he has bee able to think through everything. Says being here has helped him develop ways of coping. Says he hopes it would help him become a better person. Says he is no longer feeling hopeless and worthless. He plans to go back to his moms place and gradually get his life back on track. Says suicide is not an option. He has not had any suicidal thoughts lately. Says he knows he would soon be able to get his  inheritance. Patient reports that he has been in a better spirit. He does not feel depressed. He was working on a puzzle this morning. Says he enjoys to do things. He is able to think clearly. No racing thoughts. No negative ruminations. No perceptual abnormalities. No thoughts of violence. No homicidal thoughts.  Nursing staff reports that patient  has been appropriate on the unit. Patient has been interacting well with peers. No behavioral issues. Patient has not voiced any suicidal thoughts. Patient has not been observed to be internally stimulated. Patient has been adherent with treatment recommendations. Patient has been tolerating their medication well.   Patient was discussed at team. Team members feels that patient is back to his baseline level of function. Team agrees with plan to discharge patient today.  Demographic Factors:  Male and Caucasian  Loss Factors: Financial problems/change in socioeconomic status  Historical Factors: Impulsivity  Risk Reduction Factors:   Sense of responsibility to family, Living with another person, especially a relative, Positive social support, Positive therapeutic relationship and Positive coping skills or problem solving skills  Continued Clinical Symptoms:   As above  Cognitive Features That Contribute To Risk:  None    Suicide Risk:  Minimal: No identifiable suicidal ideation.  Patient is not having any thoughts of suicide at this time. Modifiable risk factors targeted during this admission includes depression and adjustment disorder. Demographical and historical risk factors cannot be modified. Patient is now engaging well. Patient is reliable and is future oriented. We have buffered patient's support structures. At this point, patient is at low risk of suicide. Patient is aware of the effects of psychoactive substances on decision making process. Patient has been provided with emergency contacts. Patient acknowledges to use resources provided if unforseen circumstances changes their current risk stratification.    Follow-up Information    [facility] Follow up on 08/31/2016.   Specialty:  Behavioral Health Why:  at 3:00pm for therapy with [facility]. Medication management on 7/16 at 10:40am with Wayland Denis. Contact information: [place] [address] [phone]           Plan Of Care/Follow-up recommendations:  1. Continue current psychotropic medications 2. Mental health and addiction follow up as arranged.  3. Discharge in care of his aunt 4. Provided limited quantity of prescriptions   [place]ist [place], MD 08/30/2016, 10:33 AM

## 2016-08-30 NOTE — Progress Notes (Signed)
Patient ID: Leon Mason, male   DOB: 14-Jun-1990, 26 y.o.   MRN: [id number]   D: Assumed care patient @ 2330. Patient in bed sleeping. Respiration regular and unlabored. No sign of distress noted at this time A: 15 mins checks for safety. R: Patient remains safe.

## 2016-08-30 NOTE — Progress Notes (Signed)
Recreation Therapy Notes  Date: 08/30/16 Time: 0930 Location: 300 Hall Dayroom  Group Topic: Stress Management  Goal Area(s) Addresses:  Patient will verbalize importance of using healthy stress management.  Patient will identify positive emotions associated with healthy stress management.   Behavioral Response: Engaged  Intervention: Stress Management  Activity :  Peaceful [place]aves.  LRT introduced the stress management technique of guided imagery.  LRT read a script to allow patients the opportunity for take a mental journey to the beach.  Patients were to follow along as LRT read script to engage in the technique.  Education:  Stress Management, Discharge Planning.   Education Outcome: Acknowledges edcuation/In group clarification offered/Needs additional education  Clinical Observations/Feedback: Pt attended group.   Detravion Tester Ria Comment, LRT/CTRS        Ria Comment, Tieara Flitton A 08/30/2016 12:07 PM

## 2016-08-30 NOTE — Progress Notes (Signed)
Patient ID: Leon Mason, male   DOB: [birth year], 26 y.o.   MRN: [id number] Pt d/c to home with Aunt. D/c instructions, rx's, and suicide prevention information given and reviewed. Med ed reinforced. importance of compliance with tx discussed. Pt verbalize understanding. Denies s.i.

## 2016-09-03 DIAGNOSIS — F3342 Major depressive disorder, recurrent, in full remission: Secondary | ICD-10-CM | POA: Diagnosis not present

## 2016-11-12 DIAGNOSIS — F329 Major depressive disorder, single episode, unspecified: Secondary | ICD-10-CM | POA: Diagnosis not present

## 2016-11-17 DIAGNOSIS — F329 Major depressive disorder, single episode, unspecified: Secondary | ICD-10-CM | POA: Diagnosis not present

## 2016-12-01 DIAGNOSIS — F329 Major depressive disorder, single episode, unspecified: Secondary | ICD-10-CM | POA: Diagnosis not present

## 2016-12-14 NOTE — Progress Notes (Signed)
Subjective: VH:QIONGEXBM care, asthma HPI: Leon Mason is a 26 y.o. male presenting to clinic today for:  1. Asthma/ Tobacco use disorder Patient reports a history of asthma since he was around 26 years old. He reports that he was hospitalized for acute asthma exacerbation at that time. He is never required intubation or needed hospitalization since then. He reports using albuterol inhaler every single day. He reports wheeze and shortness of breath if he does not use this. He notes that he is in every day smoker with 8 cigarettes daily. He reports he has been smoking for about 4-1/2 years. He also reports his mother is a very heavy smoker, citing that he is exposed up to 2 packs per day of secondhand smoke. He denies fevers, chills, cough, unplanned weight loss.    Past Medical History:  Diagnosis Date  . Anxiety   . Asthma   . Bipolar 1 disorder ([place])   . Hallucinations   . Schizophrenia [facility])    Past Surgical History:  Procedure Laterality Date  . WISDOM TOOTH EXTRACTION  2009   all four   Social History   Social History  . Marital status: Single    Spouse name: N/A  . Number of children: N/A  . Years of education: N/A   Occupational History  . Not on file.   Social History Main Topics  . Smoking status: Former Smoker    Packs/day: 0.25    Years: 0.50    Quit date: 09/08/2015  . Smokeless tobacco: Former Systems developer  . Alcohol use 2.4 oz/week    4 Cans of beer per week     Comment: 4/5s brandy per week in the last week/ two weeks ago was drinking 8-10 beers daily  . Drug use: No  . Sexual activity: Not Currently   Other Topics Concern  . Not on file   Social History Narrative  . No narrative on file   Current Meds  Medication Sig  . albuterol (PROVENTIL HFA;VENTOLIN HFA) 108 (90 Base) MCG/ACT inhaler Inhale 2 puffs into the lungs every 6 (six) hours as needed for wheezing or shortness of breath.  . gabapentin (NEURONTIN) 100 MG capsule Take 1 capsule (100 mg  total) by mouth 3 (three) times daily.  Marland Kitchen lithium carbonate 300 MG capsule Take 1 capsule (300 mg total) by mouth 2 (two) times daily with a meal. (Patient taking differently: Take 600 mg by mouth daily. )  . perphenazine (TRILAFON) 4 MG tablet Take 1 tablet (4 mg total) by mouth 3 (three) times daily.  . sertraline (ZOLOFT) 50 MG tablet Take 3 tablets (150 mg total) by mouth daily.  . [DISCONTINUED] albuterol (PROVENTIL HFA;VENTOLIN HFA) 108 (90 BASE) MCG/ACT inhaler Inhale 2 puffs into the lungs every 4 (four) hours as needed for wheezing or shortness of breath.    Family History  Problem Relation Age of Onset  . Eczema Brother   . Post-traumatic stress disorder Brother   . COPD Maternal Aunt   . Bipolar disorder Mother   . Alcoholism Mother   . Diabetes Mother   . OCD Father   . Bipolar disorder Father   . Heart attack Maternal Grandmother   . Breast cancer Maternal Grandmother   . Cancer Paternal Grandmother   . Heart attack Paternal Grandfather   . Asthma Neg Hx   . Allergic rhinitis Neg Hx   . Angioedema Neg Hx   . Urticaria Neg Hx   . Immunodeficiency Neg Hx   .  Atopy Neg Hx   . Prostate cancer Neg Hx   . Colon cancer Neg Hx    Allergies  Allergen Reactions  . Other Hives and Shortness Of Breath    Dogs and Cats.     Health Maintenance:Flu Vaccine: yes    ROS: Per HPI  Objective: Office vital signs reviewed. BP 135/86   Pulse 81   Temp 97.6 F (36.4 C) (Oral)   Ht 6' (1.829 m)   Wt 293 lb (132.9 kg)   BMI 39.74 kg/m   Physical Examination:  General: Awake, alert, overweight, No acute distress HEENT: Normal    Neck: No masses palpated. No lymphadenopathy    Throat: moist mucus membranes, no erythema Cardio: regular rate and rhythm, S1S2 heard, no murmurs appreciated Pulm: clear to auscultation bilaterally, no wheezes, rhonchi or rales; normal work of breathing on room air Psych: Mood stable, speech normal, does not appear to be responding to internal  stimuli, pleasant, eye contact fair.  Assessment/ Plan: 26 y.o. male   Mild persistent allergic asthma Daily use of albuterol inhaler. We will initiate Symbicort 80; 2 puffs twice a day. 2 inhalers provided as sample today. Refill of albuterol inhaler also prescribed. I did review with him the appropriate use of albuterol, citing that it is intended for as needed use. Smoking cessation reinforced. Asthma action plan reviewed and given to patient. I recommended that the patient follow-up in about 3-4 weeks.  Tobacco use disorder Contemplative. We'll continue to offer resources and support with each visit.  Establishing care with new doctor, encounter for Patient sees Comer Locket for schizophrenia. He also has therapy with [facility]. Recent hospitalization reviewed.   Leon Norlander, DO [place] [phone]

## 2016-12-17 ENCOUNTER — Encounter: Payer: Self-pay | Admitting: Family Medicine

## 2016-12-17 ENCOUNTER — Ambulatory Visit (INDEPENDENT_AMBULATORY_CARE_PROVIDER_SITE_OTHER): Payer: BLUE CROSS/BLUE SHIELD | Admitting: Family Medicine

## 2016-12-17 VITALS — BP 135/86 | HR 81 | Temp 97.6°F | Ht 72.0 in | Wt 293.0 lb

## 2016-12-17 DIAGNOSIS — J4530 Mild persistent asthma, uncomplicated: Secondary | ICD-10-CM | POA: Diagnosis not present

## 2016-12-17 DIAGNOSIS — Z7689 Persons encountering health services in other specified circumstances: Secondary | ICD-10-CM | POA: Diagnosis not present

## 2016-12-17 DIAGNOSIS — F17200 Nicotine dependence, unspecified, uncomplicated: Secondary | ICD-10-CM

## 2016-12-17 MED ORDER — BUDESONIDE-FORMOTEROL FUMARATE 80-4.5 MCG/ACT IN AERO: 2.0000 | INHALATION_SPRAY | Freq: Two times a day (BID) | RESPIRATORY_TRACT | 0 refills | Status: DC

## 2016-12-17 MED ORDER — ALBUTEROL SULFATE HFA 108 (90 BASE) MCG/ACT IN AERS: 2.0000 | INHALATION_SPRAY | Freq: Four times a day (QID) | RESPIRATORY_TRACT | 1 refills | Status: DC | PRN

## 2016-12-17 NOTE — Assessment & Plan Note (Addendum)
Contemplative. We'll continue to offer resources and support with each visit.

## 2016-12-17 NOTE — Assessment & Plan Note (Signed)
Daily use of albuterol inhaler. We will initiate Symbicort 80; 2 puffs twice a day. 2 inhalers provided as sample today. Refill of albuterol inhaler also prescribed. I did review with him the appropriate use of albuterol, citing that it is intended for as needed use. Smoking cessation reinforced. Asthma action plan reviewed and given to patient. I recommended that the patient follow-up in about 3-4 weeks.

## 2016-12-17 NOTE — Patient Instructions (Signed)
I prescribed you a new albuterol inhaler. I want you to use this sparingly only if you're short of breath or wheezing.  I have also given you a sample of Symbicort. Uses daily. I would like to use 2 puffs twice a day and follow up with me in one month.  Please work on smoking cessation. This is the best thing we can do for your health.  I will work on getting diet information for you and sent it to your home.  Leon Mason ASTHMA ACTION PLAN    Leon Mason [birth year]   Remember! Always use a spacer with your metered dose inhaler! GREEN = GO!                                   Use these medications every day!  - Breathing is good  - No cough or wheeze day or night  - Can work, sleep, exercise  Rinse your mouth after inhalers as directed Symbicort 80. 2 puffs every 12 hours. Use 15 minutes before exercise or trigger exposure  2 puffs every 6 hours as needed for wheeze or shortness of breath    YELLOW = asthma out of control   Continue to use Green Zone medicines & add:  - Cough or wheeze  - Tight chest  - Short of breath  - Difficulty breathing  - First sign of a cold (be aware of your symptoms)  Call for advice as you need to.  Quick Relief Medicine:Albuterol (Proventil, Ventolin, Proair) 2 puffs as needed every 4 hours If you improve within 20 minutes, continue to use every 4 hours as needed until completely well. Call if you are not better in 2 days or you want more advice.  If no improvement in 15-20 minutes, repeat quick relief medicine every 20 minutes for 2 more treatments (for a maximum of 3 total treatments in 1 hour). If improved continue to use every 4 hours and CALL for advice.  If not improved or you are getting worse, follow Red Zone plan.  Special Instructions:   RED = DANGER                                Get help from a doctor now!  - Albuterol not helping or not lasting 4 hours  - Frequent, severe cough  - Getting worse instead of better  - Ribs or neck muscles  show when breathing in  - Hard to walk and talk  - Lips or fingernails turn blue TAKE: Albuterol 4 puffs of inhaler If breathing is better within 15 minutes, repeat emergency medicine every 15 minutes for 2 more doses. YOU MUST CALL FOR ADVICE NOW!   STOP! MEDICAL ALERT!  If still in Red (Danger) zone after 15 minutes this could be a life-threatening emergency. Take second dose of quick relief medicine  AND  Go to the Emergency Room or call 911  If you have trouble walking or talking, are gasping for air, or have blue lips or fingernails, CALL 911!I     Environmental Control and Control of other Triggers  Allergens  Animal Dander Some people are allergic to the flakes of skin or dried saliva from animals with fur or feathers. The best thing to do: . Keep furred or feathered pets out of your home.   If you can't keep the  pet outdoors, then: . Keep the pet out of your bedroom and other sleeping areas at all times, and keep the door closed. SCHEDULE FOLLOW-UP APPOINTMENT WITHIN 3-5 DAYS OR FOLLOWUP ON DATE PROVIDED IN YOUR DISCHARGE INSTRUCTIONS *Do not delete this statement* . Remove carpets and furniture covered with cloth from your home.   If that is not possible, keep the pet away from fabric-covered furniture   and carpets.  Dust Mites Many people with asthma are allergic to dust mites. Dust mites are tiny bugs that are found in every home-in mattresses, pillows, carpets, upholstered furniture, bedcovers, clothes, stuffed toys, and fabric or other fabric-covered items. Things that can help: . Encase your mattress in a special dust-proof cover. . Encase your pillow in a special dust-proof cover or wash the pillow each week in hot water. Water must be hotter than 130 F to kill the mites. Cold or warm water used with detergent and bleach can also be effective. . Wash the sheets and blankets on your bed each week in hot water. . Reduce indoor humidity to below 60 percent  (ideally between 30-50 percent). Dehumidifiers or central air conditioners can do this. . Try not to sleep or lie on cloth-covered cushions. . Remove carpets from your bedroom and those laid on concrete, if you can. Marland Kitchen Keep stuffed toys out of the bed or wash the toys weekly in hot water or   cooler water with detergent and bleach.  Cockroaches Many people with asthma are allergic to the dried droppings and remains of cockroaches. The best thing to do: . Keep food and garbage in closed containers. Never leave food out. . Use poison baits, powders, gels, or paste (for example, boric acid).   You can also use traps. . If a spray is used to kill roaches, stay out of the room until the odor   goes away.  Indoor Mold . Fix leaky faucets, pipes, or other sources of water that have mold   around them. . Clean moldy surfaces with a cleaner that has bleach in it.   Pollen and Outdoor Mold  What to do during your allergy season (when pollen or mold spore counts are high) . Try to keep your windows closed. . Stay indoors with windows closed from late morning to afternoon,   if you can. Pollen and some mold spore counts are highest at that time. . Ask your doctor whether you need to take or increase anti-inflammatory   medicine before your allergy season starts.  Irritants  Tobacco Smoke . If you smoke, ask your doctor for ways to help you quit. Ask family   members to quit smoking, too. . Do not allow smoking in your home or car.  Smoke, Strong Odors, and Sprays . If possible, do not use a wood-burning stove, kerosene heater, or fireplace. . Try to stay away from strong odors and sprays, such as perfume, talcum    powder, hair spray, and paints.  Other things that bring on asthma symptoms in some people include:  Vacuum Cleaning . Try to get someone else to vacuum for you once or twice a week,   if you can. Stay out of rooms while they are being vacuumed and for   a short while  afterward. . If you vacuum, use a dust mask (from a hardware store), a double-layered   or microfilter vacuum cleaner bag, or a vacuum cleaner with a HEPA filter.  Other Things That Can Make Asthma Worse . Sulfites  in foods and beverages: Do not drink beer or wine or eat dried   fruit, processed potatoes, or shrimp if they cause asthma symptoms. . Cold air: Cover your nose and mouth with a scarf on cold or windy days. . Other medicines: Tell your doctor about all the medicines you take.   Include cold medicines, aspirin, vitamins and other supplements, and   nonselective beta-blockers (including those in eye drops).

## 2017-02-15 DIAGNOSIS — J069 Acute upper respiratory infection, unspecified: Secondary | ICD-10-CM | POA: Diagnosis not present

## 2017-02-15 DIAGNOSIS — J4530 Mild persistent asthma, uncomplicated: Secondary | ICD-10-CM | POA: Diagnosis not present

## 2017-02-15 DIAGNOSIS — F209 Schizophrenia, unspecified: Secondary | ICD-10-CM | POA: Diagnosis not present

## 2017-02-15 DIAGNOSIS — F319 Bipolar disorder, unspecified: Secondary | ICD-10-CM | POA: Diagnosis not present

## 2017-02-23 DIAGNOSIS — R112 Nausea with vomiting, unspecified: Secondary | ICD-10-CM | POA: Diagnosis not present

## 2017-02-23 DIAGNOSIS — T23171A Burn of first degree of right wrist, initial encounter: Secondary | ICD-10-CM | POA: Diagnosis not present

## 2017-02-23 DIAGNOSIS — J069 Acute upper respiratory infection, unspecified: Secondary | ICD-10-CM | POA: Diagnosis not present

## 2017-02-23 DIAGNOSIS — R05 Cough: Secondary | ICD-10-CM | POA: Diagnosis not present

## 2017-04-06 DIAGNOSIS — Z872 Personal history of diseases of the skin and subcutaneous tissue: Secondary | ICD-10-CM | POA: Diagnosis not present

## 2017-04-20 DIAGNOSIS — F17210 Nicotine dependence, cigarettes, uncomplicated: Secondary | ICD-10-CM | POA: Diagnosis not present

## 2017-04-20 DIAGNOSIS — B9789 Other viral agents as the cause of diseases classified elsewhere: Secondary | ICD-10-CM | POA: Diagnosis not present

## 2017-04-20 DIAGNOSIS — J069 Acute upper respiratory infection, unspecified: Secondary | ICD-10-CM | POA: Diagnosis not present

## 2017-04-20 DIAGNOSIS — R05 Cough: Secondary | ICD-10-CM | POA: Diagnosis not present

## 2017-04-20 DIAGNOSIS — J45909 Unspecified asthma, uncomplicated: Secondary | ICD-10-CM | POA: Diagnosis not present

## 2017-04-20 DIAGNOSIS — R9431 Abnormal electrocardiogram [ECG] [EKG]: Secondary | ICD-10-CM | POA: Diagnosis not present

## 2017-04-20 DIAGNOSIS — R079 Chest pain, unspecified: Secondary | ICD-10-CM | POA: Diagnosis not present

## 2017-04-20 DIAGNOSIS — R000 Tachycardia, unspecified: Secondary | ICD-10-CM | POA: Diagnosis not present

## 2017-04-20 DIAGNOSIS — I499 Cardiac arrhythmia, unspecified: Secondary | ICD-10-CM | POA: Diagnosis not present

## 2017-04-20 DIAGNOSIS — I4519 Other right bundle-branch block: Secondary | ICD-10-CM | POA: Diagnosis not present

## 2017-04-21 DIAGNOSIS — R079 Chest pain, unspecified: Secondary | ICD-10-CM | POA: Diagnosis not present

## 2017-04-21 DIAGNOSIS — I499 Cardiac arrhythmia, unspecified: Secondary | ICD-10-CM | POA: Diagnosis not present

## 2017-04-21 DIAGNOSIS — I4519 Other right bundle-branch block: Secondary | ICD-10-CM | POA: Diagnosis not present

## 2017-05-30 DIAGNOSIS — R45851 Suicidal ideations: Secondary | ICD-10-CM | POA: Diagnosis not present

## 2017-05-31 DIAGNOSIS — F17210 Nicotine dependence, cigarettes, uncomplicated: Secondary | ICD-10-CM | POA: Diagnosis not present

## 2017-05-31 DIAGNOSIS — F251 Schizoaffective disorder, depressive type: Secondary | ICD-10-CM | POA: Diagnosis not present

## 2017-05-31 DIAGNOSIS — Y929 Unspecified place or not applicable: Secondary | ICD-10-CM | POA: Diagnosis not present

## 2017-05-31 DIAGNOSIS — L0591 Pilonidal cyst without abscess: Secondary | ICD-10-CM | POA: Diagnosis not present

## 2017-05-31 DIAGNOSIS — T433X6A Underdosing of phenothiazine antipsychotics and neuroleptics, initial encounter: Secondary | ICD-10-CM | POA: Diagnosis not present

## 2017-05-31 DIAGNOSIS — Z7951 Long term (current) use of inhaled steroids: Secondary | ICD-10-CM | POA: Diagnosis not present

## 2017-05-31 DIAGNOSIS — Z87891 Personal history of nicotine dependence: Secondary | ICD-10-CM | POA: Diagnosis not present

## 2017-05-31 DIAGNOSIS — F609 Personality disorder, unspecified: Secondary | ICD-10-CM | POA: Diagnosis not present

## 2017-05-31 DIAGNOSIS — R45851 Suicidal ideations: Secondary | ICD-10-CM | POA: Diagnosis not present

## 2017-05-31 DIAGNOSIS — Z91128 Patient's intentional underdosing of medication regimen for other reason: Secondary | ICD-10-CM | POA: Diagnosis not present

## 2017-05-31 DIAGNOSIS — T43226A Underdosing of selective serotonin reuptake inhibitors, initial encounter: Secondary | ICD-10-CM | POA: Diagnosis not present

## 2017-05-31 DIAGNOSIS — F332 Major depressive disorder, recurrent severe without psychotic features: Secondary | ICD-10-CM | POA: Diagnosis not present

## 2017-05-31 DIAGNOSIS — F419 Anxiety disorder, unspecified: Secondary | ICD-10-CM | POA: Diagnosis not present

## 2017-05-31 DIAGNOSIS — Z818 Family history of other mental and behavioral disorders: Secondary | ICD-10-CM | POA: Diagnosis not present

## 2017-05-31 DIAGNOSIS — Z79899 Other long term (current) drug therapy: Secondary | ICD-10-CM | POA: Diagnosis not present

## 2017-05-31 DIAGNOSIS — J45909 Unspecified asthma, uncomplicated: Secondary | ICD-10-CM | POA: Diagnosis not present

## 2017-06-01 DIAGNOSIS — F251 Schizoaffective disorder, depressive type: Secondary | ICD-10-CM | POA: Diagnosis not present

## 2017-06-02 DIAGNOSIS — F251 Schizoaffective disorder, depressive type: Secondary | ICD-10-CM | POA: Diagnosis not present

## 2017-06-03 DIAGNOSIS — F251 Schizoaffective disorder, depressive type: Secondary | ICD-10-CM | POA: Diagnosis not present

## 2017-06-06 DIAGNOSIS — F251 Schizoaffective disorder, depressive type: Secondary | ICD-10-CM | POA: Diagnosis not present

## 2017-06-06 DIAGNOSIS — F609 Personality disorder, unspecified: Secondary | ICD-10-CM | POA: Diagnosis not present

## 2017-06-10 DIAGNOSIS — F609 Personality disorder, unspecified: Secondary | ICD-10-CM | POA: Diagnosis not present

## 2017-06-10 DIAGNOSIS — R197 Diarrhea, unspecified: Secondary | ICD-10-CM | POA: Diagnosis not present

## 2017-06-10 DIAGNOSIS — R45851 Suicidal ideations: Secondary | ICD-10-CM | POA: Diagnosis not present

## 2017-06-10 DIAGNOSIS — E669 Obesity, unspecified: Secondary | ICD-10-CM | POA: Diagnosis not present

## 2017-06-10 DIAGNOSIS — F4321 Adjustment disorder with depressed mood: Secondary | ICD-10-CM | POA: Diagnosis not present

## 2017-06-10 DIAGNOSIS — J069 Acute upper respiratory infection, unspecified: Secondary | ICD-10-CM | POA: Diagnosis not present

## 2017-06-10 DIAGNOSIS — Z590 Homelessness: Secondary | ICD-10-CM | POA: Diagnosis not present

## 2017-06-10 DIAGNOSIS — M545 Low back pain: Secondary | ICD-10-CM | POA: Diagnosis not present

## 2017-06-10 DIAGNOSIS — Z91410 Personal history of adult physical and sexual abuse: Secondary | ICD-10-CM | POA: Diagnosis not present

## 2017-06-10 DIAGNOSIS — F17210 Nicotine dependence, cigarettes, uncomplicated: Secondary | ICD-10-CM | POA: Diagnosis not present

## 2017-06-10 DIAGNOSIS — Z91048 Other nonmedicinal substance allergy status: Secondary | ICD-10-CM | POA: Diagnosis not present

## 2017-06-10 DIAGNOSIS — F251 Schizoaffective disorder, depressive type: Secondary | ICD-10-CM | POA: Diagnosis not present

## 2017-06-10 DIAGNOSIS — G2581 Restless legs syndrome: Secondary | ICD-10-CM | POA: Diagnosis not present

## 2017-06-10 DIAGNOSIS — Z79899 Other long term (current) drug therapy: Secondary | ICD-10-CM | POA: Diagnosis not present

## 2017-06-10 DIAGNOSIS — Z6835 Body mass index (BMI) 35.0-35.9, adult: Secondary | ICD-10-CM | POA: Diagnosis not present

## 2017-06-10 DIAGNOSIS — G8929 Other chronic pain: Secondary | ICD-10-CM | POA: Diagnosis not present

## 2017-06-11 DIAGNOSIS — F609 Personality disorder, unspecified: Secondary | ICD-10-CM | POA: Diagnosis not present

## 2017-06-11 DIAGNOSIS — F4321 Adjustment disorder with depressed mood: Secondary | ICD-10-CM | POA: Diagnosis not present

## 2017-06-12 DIAGNOSIS — F609 Personality disorder, unspecified: Secondary | ICD-10-CM | POA: Diagnosis not present

## 2017-06-13 DIAGNOSIS — F4321 Adjustment disorder with depressed mood: Secondary | ICD-10-CM | POA: Diagnosis not present

## 2017-06-14 DIAGNOSIS — F4321 Adjustment disorder with depressed mood: Secondary | ICD-10-CM | POA: Diagnosis not present

## 2017-06-15 DIAGNOSIS — F4321 Adjustment disorder with depressed mood: Secondary | ICD-10-CM | POA: Diagnosis not present

## 2017-06-16 DIAGNOSIS — F4321 Adjustment disorder with depressed mood: Secondary | ICD-10-CM | POA: Diagnosis not present

## 2017-09-07 DIAGNOSIS — F29 Unspecified psychosis not due to a substance or known physiological condition: Secondary | ICD-10-CM | POA: Diagnosis not present

## 2017-09-07 DIAGNOSIS — F209 Schizophrenia, unspecified: Secondary | ICD-10-CM | POA: Diagnosis not present

## 2017-09-07 DIAGNOSIS — R45851 Suicidal ideations: Secondary | ICD-10-CM | POA: Diagnosis not present

## 2017-09-07 DIAGNOSIS — Z79899 Other long term (current) drug therapy: Secondary | ICD-10-CM | POA: Diagnosis not present

## 2017-11-14 DIAGNOSIS — F29 Unspecified psychosis not due to a substance or known physiological condition: Secondary | ICD-10-CM | POA: Diagnosis not present

## 2017-11-16 DIAGNOSIS — Z6833 Body mass index (BMI) 33.0-33.9, adult: Secondary | ICD-10-CM | POA: Diagnosis not present

## 2017-11-16 DIAGNOSIS — Z760 Encounter for issue of repeat prescription: Secondary | ICD-10-CM | POA: Diagnosis not present

## 2017-11-25 DIAGNOSIS — F3342 Major depressive disorder, recurrent, in full remission: Secondary | ICD-10-CM | POA: Diagnosis not present

## 2017-12-08 ENCOUNTER — Ambulatory Visit (INDEPENDENT_AMBULATORY_CARE_PROVIDER_SITE_OTHER): Payer: BLUE CROSS/BLUE SHIELD | Admitting: Psychiatry

## 2017-12-08 DIAGNOSIS — F203 Undifferentiated schizophrenia: Secondary | ICD-10-CM | POA: Diagnosis not present

## 2017-12-08 MED ORDER — LITHIUM CARBONATE ER 450 MG PO TBCR: 450.0000 mg | EXTENDED_RELEASE_TABLET | Freq: Every day | ORAL | 0 refills | Status: DC

## 2017-12-08 NOTE — Progress Notes (Signed)
[facility]  Patient ID: Leon Mason,  MRN: [id number]  PCP: Leon Norlander, DO  Date of Evaluation: 12/08/2017 Time spent:20 minutes   HISTORY/CURRENT STATUS: HPI this patient is a white male last seen 2 weeks ago.  Hard to that she he had not been seen since April 2018 he had several hospital admissions prior to the last visit.  He reported bad depression also some manic type symptoms and a lot of anxiety he also had psychosis.  He was started on lithium ER 450 mg Trilafon 4 mg 3 times daily.  Today the patient is better he still has some depression.  No suicidal thoughts.  Patient is advised to go to the emergency room if he has withdrawal symptoms as he decrease his alcohol  Individual Medical History/ Review of Systems: Changes? :No  Allergies: Other  Current Medications:  Current Outpatient Medications:  .  albuterol (PROVENTIL HFA;VENTOLIN HFA) 108 (90 Base) MCG/ACT inhaler, Inhale 2 puffs into the lungs every 6 (six) hours as needed for wheezing or shortness of breath., Disp: 1 Inhaler, Rfl: 1 .  benztropine (COGENTIN) 2 MG tablet, Take 2 mg by mouth at bedtime., Disp: , Rfl:  .  gabapentin (NEURONTIN) 100 MG capsule, Take 1 capsule (100 mg total) by mouth 3 (three) times daily., Disp: 90 capsule, Rfl: 0 .  perphenazine (TRILAFON) 4 MG tablet, Take 1 tablet (4 mg total) by mouth 3 (three) times daily., Disp: 90 tablet, Rfl: 0 .  sertraline (ZOLOFT) 50 MG tablet, Take 3 tablets (150 mg total) by mouth daily. (Patient taking differently: Take 200 mg by mouth daily. ), Disp: 90 tablet, Rfl: 0 .  budesonide-formoterol (SYMBICORT) 80-4.5 MCG/ACT inhaler, Inhale 2 puffs into the lungs 2 (two) times daily. (Patient not taking: Reported on 12/08/2017), Disp: 2 Inhaler, Rfl: 0 .  lithium carbonate (ESKALITH) 450 MG CR tablet, Take 1 tablet (450 mg total) by mouth daily., Disp: 30 tablet, Rfl: 0 Medication Side Effects: None  Family Medical/ Social History: Changes?  Yes Leon Mason's  Mom has recently been hospitalized for surgery.  MENTAL HEALTH EXAM:  There were no vitals taken for this visit.There is no height or weight on file to calculate BMI.  General Appearance: Casual  Eye Contact:  Good  Speech:  Normal Rate  Volume:  Normal  Mood:  Euphoric  Affect:  Appropriate  Thought Process:  Coherent  Orientation:  Full (Time, Place, and Person)  Thought Content: WDL   Suicidal Thoughts:  No  Homicidal Thoughts:  No  Memory:  Immediate  Judgement:  Good  Insight:  Good  Psychomotor Activity:  Normal  Concentration:  Concentration: Good  Recall:  Good  Fund of Knowledge: Good  Language: Good  Akathisia:  No  AIMS (if indicated): not done  Assets:  Desire for Improvement  ADL's:  Intact  Cognition: WNL  Prognosis:  Good    DIAGNOSES:    ICD-10-CM   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  He is to work on decreasing alcohol.  He is to return in 3 weeks.   Comer Locket, PA-C

## 2017-12-09 ENCOUNTER — Telehealth: Payer: Self-pay | Admitting: Family Medicine

## 2017-12-10 ENCOUNTER — Encounter: Payer: Self-pay | Admitting: Family Medicine

## 2017-12-10 ENCOUNTER — Ambulatory Visit (INDEPENDENT_AMBULATORY_CARE_PROVIDER_SITE_OTHER): Payer: BLUE CROSS/BLUE SHIELD | Admitting: Family Medicine

## 2017-12-10 VITALS — BP 138/80 | HR 58 | Temp 98.3°F | Ht 72.0 in | Wt 278.0 lb

## 2017-12-10 DIAGNOSIS — J301 Allergic rhinitis due to pollen: Secondary | ICD-10-CM | POA: Diagnosis not present

## 2017-12-10 DIAGNOSIS — J4530 Mild persistent asthma, uncomplicated: Secondary | ICD-10-CM

## 2017-12-10 MED ORDER — BUDESONIDE-FORMOTEROL FUMARATE 80-4.5 MCG/ACT IN AERO: 2.0000 | INHALATION_SPRAY | Freq: Two times a day (BID) | RESPIRATORY_TRACT | 12 refills | Status: DC

## 2017-12-10 MED ORDER — LORATADINE 10 MG PO TABS: 10.0000 mg | ORAL_TABLET | Freq: Every day | ORAL | 11 refills | Status: DC

## 2017-12-10 MED ORDER — ALBUTEROL SULFATE HFA 108 (90 BASE) MCG/ACT IN AERS: 2.0000 | INHALATION_SPRAY | Freq: Four times a day (QID) | RESPIRATORY_TRACT | 3 refills | Status: DC | PRN

## 2017-12-10 NOTE — Progress Notes (Signed)
Subjective: CC: asthma PCP: [name], DO OYD:XAJOINOM A Leon Mason is a 27 y.o. male presenting to clinic today for:  1. Asthma Patient with history of asthma since age 51.  He has noticed some increased wheezes and cough since the weather change.  He states that he did try the samples of the Symbicort but states that he did notice a great deal of difference.  Of note he was using 1 puff at a time.  He does still have 1 of the samples and is willing to retry this.  He has relied on his albuterol inhaler for relief.  Denies any fevers, chills, hemoptysis.  He uses an over-the-counter antihistamine.   ROS: Per HPI  Allergies  Allergen Reactions  . Other Hives and Shortness Of Breath    Dogs and Cats.   Past Medical History:  Diagnosis Date  . Anxiety   . Asthma   . Bipolar 1 disorder ([place])   . Hallucinations   . Schizophrenia ([place])     Current Outpatient Medications:  .  albuterol (PROVENTIL HFA;VENTOLIN HFA) 108 (90 Base) MCG/ACT inhaler, Inhale 2 puffs into the lungs every 6 (six) hours as needed for wheezing or shortness of breath., Disp: 1 Inhaler, Rfl: 1 .  benztropine (COGENTIN) 2 MG tablet, Take 2 mg by mouth at bedtime., Disp: , Rfl:  .  gabapentin (NEURONTIN) 100 MG capsule, Take 1 capsule (100 mg total) by mouth 3 (three) times daily., Disp: 90 capsule, Rfl: 0 .  lithium carbonate (ESKALITH) 450 MG CR tablet, Take 1 tablet (450 mg total) by mouth daily., Disp: 30 tablet, Rfl: 0 .  perphenazine (TRILAFON) 4 MG tablet, Take 1 tablet (4 mg total) by mouth 3 (three) times daily., Disp: 90 tablet, Rfl: 0 .  sertraline (ZOLOFT) 50 MG tablet, Take 3 tablets (150 mg total) by mouth daily. (Patient taking differently: Take 200 mg by mouth daily. ), Disp: 90 tablet, Rfl: 0 Social History   Socioeconomic History  . Marital status: Single    Spouse name: Not on file  . Number of children: Not on file  . Years of education: Not on file  . Highest education level: Not on  file  Occupational History  . Not on file  Social Needs  . Financial resource strain: Not on file  . Food insecurity:    Worry: Not on file    Inability: Not on file  . Transportation needs:    Medical: Not on file    Non-medical: Not on file  Tobacco Use  . Smoking status: Former Smoker    Packs/day: 0.25    Years: 0.50    Pack years: 0.12    Last attempt to quit: 09/08/2015    Years since quitting: 2.2  . Smokeless tobacco: Former Network engineer and Sexual Activity  . Alcohol use: Yes    Alcohol/week: 4.0 standard drinks    Types: 4 Cans of beer per week    Comment: 4/5s brandy per week in the last week/ two weeks ago was drinking 8-10 beers daily  . Drug use: No  . Sexual activity: Not Currently  Lifestyle  . Physical activity:    Days per week: Not on file    Minutes per session: Not on file  . Stress: Not on file  Relationships  . Social connections:    Talks on phone: Not on file    Gets together: Not on file    Attends religious service: Not on file  Active member of club or organization: Not on file    Attends meetings of clubs or organizations: Not on file    Relationship status: Not on file  . Intimate partner violence:    Fear of current or ex partner: Not on file    Emotionally abused: Not on file    Physically abused: Not on file    Forced sexual activity: Not on file  Other Topics Concern  . Not on file  Social History Narrative  . Not on file   Family History  Problem Relation Age of Onset  . Eczema Brother   . Post-traumatic stress disorder Brother   . COPD Maternal Aunt   . Bipolar disorder Mother   . Alcoholism Mother   . Diabetes Mother   . OCD Father   . Bipolar disorder Father   . Heart attack Maternal Grandmother   . Breast cancer Maternal Grandmother   . Cancer Paternal Grandmother   . Heart attack Paternal Grandfather   . Asthma Neg Hx   . Allergic rhinitis Neg Hx   . Angioedema Neg Hx   . Urticaria Neg Hx   .  Immunodeficiency Neg Hx   . Atopy Neg Hx   . Prostate cancer Neg Hx   . Colon cancer Neg Hx     Objective: Office vital signs reviewed. BP 138/80   Pulse (!) 58   Temp 98.3 F (36.8 C) (Other (Comment))   Ht 6' (1.829 m)   Wt 278 lb (126.1 kg)   SpO2 95%   BMI 37.70 kg/m   Physical Examination:  General: Awake, alert, well nourished, No acute distress HEENT: sclera white, MMM Cardio: regular rate and rhythm, S1S2 heard, no murmurs appreciated Pulm: Mild expiratory wheezes throughout. No rhonchi or rales; normal work of breathing on room air  Assessment/ Plan: 27 y.o. male   1. Mild persistent allergic asthma Patient with normal work of breathing on room air.  He has normal oxygen saturation.  Physical exam was remarkable for mild expiratory wheezes.  I have prescribed him the Symbicort and reiterated that he should use 2 puffs twice daily for asthma maintenance.  I have also refilled his albuterol inhaler to use as needed for flares.  He will follow-up with me as needed on this issue.  No evidence of acute infection at this time.  2. Allergic rhinitis due to pollen, unspecified seasonality Claritin prescribed.  Encouraged daily use.   Meds ordered this encounter  Medications  . albuterol (PROVENTIL HFA;VENTOLIN HFA) 108 (90 Base) MCG/ACT inhaler    Sig: Inhale 2 puffs into the lungs every 6 (six) hours as needed for wheezing or shortness of breath.    Dispense:  1 Inhaler    Refill:  3  . DISCONTD: budesonide-formoterol (SYMBICORT) 80-4.5 MCG/ACT inhaler    Sig: Inhale 2 puffs into the lungs 2 (two) times daily.    Dispense:  1 Inhaler    Refill:  12  . loratadine (CLARITIN) 10 MG tablet    Sig: Take 1 tablet (10 mg total) by mouth daily.    Dispense:  30 tablet    Refill:  11  . budesonide-formoterol (SYMBICORT) 80-4.5 MCG/ACT inhaler    Sig: Inhale 2 puffs into the lungs 2 (two) times daily.    Dispense:  1 Inhaler    Refill:  [place],  [place] [phone]

## 2017-12-13 ENCOUNTER — Ambulatory Visit: Payer: BLUE CROSS/BLUE SHIELD | Admitting: Psychiatry

## 2017-12-13 NOTE — Progress Notes (Unsigned)
Session cancelled with roughly 24hr notice.  PT has been seen only sporadically for several years now, largely due to difficulty organizing lifestyle, finances, and health care.  Available to follow up at PT's discretion.

## 2017-12-21 ENCOUNTER — Ambulatory Visit: Payer: BLUE CROSS/BLUE SHIELD | Admitting: Psychiatry

## 2017-12-24 ENCOUNTER — Encounter: Payer: Self-pay | Admitting: Emergency Medicine

## 2018-01-04 ENCOUNTER — Ambulatory Visit: Payer: BLUE CROSS/BLUE SHIELD | Admitting: Psychiatry

## 2018-01-17 ENCOUNTER — Emergency Department (HOSPITAL_COMMUNITY)
Admission: EM | Admit: 2018-01-17 | Discharge: 2018-01-17 | Disposition: A | Discharge status: Home / Self Care | Payer: BLUE CROSS/BLUE SHIELD | Attending: Emergency Medicine | Admitting: Emergency Medicine

## 2018-01-17 ENCOUNTER — Other Ambulatory Visit: Payer: Self-pay

## 2018-01-17 ENCOUNTER — Encounter (HOSPITAL_COMMUNITY): Payer: Self-pay | Admitting: Emergency Medicine

## 2018-01-17 DIAGNOSIS — F319 Bipolar disorder, unspecified: Secondary | ICD-10-CM | POA: Insufficient documentation

## 2018-01-17 DIAGNOSIS — Z87891 Personal history of nicotine dependence: Secondary | ICD-10-CM | POA: Diagnosis not present

## 2018-01-17 DIAGNOSIS — J45901 Unspecified asthma with (acute) exacerbation: Secondary | ICD-10-CM

## 2018-01-17 DIAGNOSIS — F1020 Alcohol dependence, uncomplicated: Secondary | ICD-10-CM | POA: Insufficient documentation

## 2018-01-17 DIAGNOSIS — F419 Anxiety disorder, unspecified: Secondary | ICD-10-CM | POA: Insufficient documentation

## 2018-01-17 DIAGNOSIS — J4541 Moderate persistent asthma with (acute) exacerbation: Secondary | ICD-10-CM | POA: Diagnosis not present

## 2018-01-17 DIAGNOSIS — Z79899 Other long term (current) drug therapy: Secondary | ICD-10-CM | POA: Diagnosis not present

## 2018-01-17 DIAGNOSIS — J45909 Unspecified asthma, uncomplicated: Secondary | ICD-10-CM | POA: Diagnosis not present

## 2018-01-17 DIAGNOSIS — I959 Hypotension, unspecified: Secondary | ICD-10-CM | POA: Diagnosis not present

## 2018-01-17 DIAGNOSIS — F209 Schizophrenia, unspecified: Secondary | ICD-10-CM | POA: Diagnosis not present

## 2018-01-17 MED ORDER — ALBUTEROL SULFATE HFA 108 (90 BASE) MCG/ACT IN AERS
2.0000 | INHALATION_SPRAY | Freq: Once | RESPIRATORY_TRACT | Status: AC
  Administered 2018-01-17: 2 via RESPIRATORY_TRACT
  Filled 2018-01-17: qty 6.7

## 2018-01-17 NOTE — ED Provider Notes (Signed)
[facility] EMERGENCY DEPARTMENT Provider Note   CSN: [id number] Arrival date & time: 01/17/18  1802     History   Chief Complaint Chief Complaint  Patient presents with  . needs inhaler    HPI Leon Mason is a 27 y.o. male.  Patient is a 27 year old male who presents to the emergency department by EMS following an asthma attack.  The patient states that he was working around the house.  He somehow stirred up a lot of dust, and it stimulated an asthma attack.  Patient has a history of asthma.  A history of anxiety, bipolar illness, and tobacco use abuse.  The patient states that he called EMS, they gave him a breathing treatment and he was doing a lot better.  He says that he currently does not have insurance, cannot see Dr., and so he came to the emergency department to attempt to obtain an inhaler.  He is concerned that the changes in the weather will also help stimulate a asthma attack.  He denies any recent injury to the chest.  He has not been around any noxious fumes.  No recent operations or procedures involving the chest or lungs.  The history is provided by the patient.    Past Medical History:  Diagnosis Date  . Anxiety   . Asthma   . Bipolar 1 disorder ([place])   . Hallucinations   . Schizophrenia [facility])     Patient Active Problem List   Diagnosis Date Noted  . Tobacco use disorder 12/17/2016  . Schizoaffective disorder, bipolar type ([place]) 08/23/2016  . Alcohol use disorder, severe, dependence ([place]) 08/23/2016  . Allergic rhinitis due to pollen 10/09/2015  . 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 Mild persistent allergic asthma 10/09/2015  . Psychosis ([place]) 06/14/2013    Past Surgical History:  Procedure Laterality Date  . [place] EXTRACTION  2009   all four        Home Medications    Prior to Admission medications   Medication Sig Start Date End Date Taking? Authorizing Provider  albuterol (PROVENTIL HFA;VENTOLIN HFA) 108 (90 Base) MCG/ACT  inhaler Inhale 2 puffs into the lungs every 6 (six) hours as needed for wheezing or shortness of breath. 12/10/17   [name], DO  benztropine (COGENTIN) 2 MG tablet Take 2 mg by mouth at bedtime.    [provider]  budesonide-formoterol (SYMBICORT) 80-4.5 MCG/ACT inhaler Inhale 2 puffs into the lungs 2 (two) times daily. 12/10/17   [name], DO  gabapentin (NEURONTIN) 100 MG capsule Take 1 capsule (100 mg total) by mouth 3 (three) times daily. 08/30/16   Derrill Center, NP  lithium carbonate (ESKALITH) 450 MG CR tablet Take 1 tablet (450 mg total) by mouth daily. 12/08/17   Shugart, Lissa Hoard, PA-C  loratadine (CLARITIN) 10 MG tablet Take 1 tablet (10 mg total) by mouth daily. 12/10/17   [name], DO  perphenazine (TRILAFON) 4 MG tablet Take 1 tablet (4 mg total) by mouth 3 (three) times daily. 08/30/16   Derrill Center, NP  sertraline (ZOLOFT) 50 MG tablet Take 3 tablets (150 mg total) by mouth daily. Patient taking differently: Take 200 mg by mouth daily.  08/31/16   Derrill Center, NP    Family History Family History  Problem Relation Age of Onset  . Eczema Brother   . Post-traumatic stress disorder Brother   . COPD Maternal Aunt   . Bipolar disorder Mother   . Alcoholism Mother   .  Diabetes Mother   . OCD Father   . Bipolar disorder Father   . Heart attack Maternal Grandmother   . Breast cancer Maternal Grandmother   . Cancer Paternal Grandmother   . Heart attack Paternal Grandfather   . Asthma Neg Hx   . Allergic rhinitis Neg Hx   . Angioedema Neg Hx   . Urticaria Neg Hx   . Immunodeficiency Neg Hx   . Atopy Neg Hx   . Prostate cancer Neg Hx   . Colon cancer Neg Hx     Social History Social History   Tobacco Use  . Smoking status: Former Smoker    Packs/day: 0.25    Years: 0.50    Pack years: 0.12    Last attempt to quit: 09/08/2015    Years since quitting: 2.3  . Smokeless tobacco: Former Network engineer Use Topics  . Alcohol use:  Yes    Alcohol/week: 4.0 standard drinks    Types: 4 Cans of beer per week    Comment: 4/5s brandy per week in the last week/ two weeks ago was drinking 8-10 beers daily  . Drug use: No     Allergies   Other   Review of Systems Review of Systems  Constitutional: Negative for activity change.       All ROS Neg except as noted in HPI  HENT: Negative for nosebleeds.   Eyes: Negative for photophobia and discharge.  Respiratory: Positive for shortness of breath and wheezing. Negative for cough.   Cardiovascular: Negative for chest pain and palpitations.  Gastrointestinal: Negative for abdominal pain and blood in stool.  Genitourinary: Negative for dysuria, frequency and hematuria.  Musculoskeletal: Negative for arthralgias, back pain and neck pain.  Skin: Negative.   Neurological: Negative for dizziness, seizures and speech difficulty.  Psychiatric/Behavioral: Negative for confusion. The patient is nervous/anxious.      Physical Exam Updated Vital Signs BP 131/70 (BP Location: Left Arm)   Pulse 63   Temp 97.6 F (36.4 C) (Oral)   Resp 18   Ht 6\' 3"  (1.905 m)   Wt 122.5 kg   SpO2 96%   BMI 33.75 kg/m   Physical Exam  Constitutional: He is oriented to person, place, and time. He appears well-developed and well-nourished.  Non-toxic appearance.  HENT:  Head: Normocephalic.  Right Ear: Tympanic membrane and external ear normal.  Left Ear: Tympanic membrane and external ear normal.  Eyes: Pupils are equal, round, and reactive to light. EOM and lids are normal.  Neck: Normal range of motion. Neck supple. Carotid bruit is not present.  Cardiovascular: Normal rate, regular rhythm, normal heart sounds, intact distal pulses and normal pulses.  Pulmonary/Chest: No respiratory distress. He has wheezes.  There is symmetrical rise and fall of the chest.  The patient speaks in complete sentences at this time without problem.  This is following treatment by EMS.  Abdominal: Soft.  Bowel sounds are normal. There is no tenderness. There is no guarding.  Musculoskeletal: Normal range of motion.  Lymphadenopathy:       Head (right side): No submandibular adenopathy present.       Head (left side): No submandibular adenopathy present.    He has no cervical adenopathy.  Neurological: He is alert and oriented to person, place, and time. He has normal strength. No cranial nerve deficit or sensory deficit.  Skin: Skin is warm and dry.  Psychiatric: He has a normal mood and affect. His speech is normal.  Nursing note  and vitals reviewed.    ED Treatments / Results  Labs (all labs ordered are listed, but only abnormal results are displayed) Labs Reviewed - No data to display  EKG None  Radiology No results found.  Procedures Procedures (including critical care time)  Medications Ordered in ED Medications - No data to display   Initial Impression / Assessment and Plan / ED Course  I have reviewed the triage vital signs and the nursing notes.  Pertinent labs & imaging results that were available during my care of the patient were reviewed by me and considered in my medical decision making (see chart for details).       Final Clinical Impressions(s) / ED Diagnoses MDM  Pulse oximetry is 96% on room air.  Within normal limits by my interpretation.  The patient states that he has been around a lot of dust recently that stimulated an asthma attack.  He does not have an inhaler, and he presents now for assistance with an inhaler.  Patient given an inhaler here in the emergency department.  Patient also given information on community resources to help him obtain a primary physician.   Final diagnoses:  Moderate asthma with exacerbation, unspecified whether persistent    ED Discharge Orders    None       Lily Kocher, Hershal Coria 01/17/18 2032    Davonna Belling, MD 01/17/18 [phone]

## 2018-01-17 NOTE — Discharge Instructions (Addendum)
Please increase fluids.  Please use your albuterol inhaler 2 puffs every 4 hours.  Please see your physician or return to the emergency department if any changes in your condition, problems, or concerns.

## 2018-01-17 NOTE — ED Triage Notes (Signed)
Pt was cleaning around house today and had asthma attack. EMS got there and gave a breathing tx and pt now states he is much better, all he needs is an inhaler due to his insurance not covering it. No resp distress or sob noted.

## 2018-02-06 ENCOUNTER — Other Ambulatory Visit: Payer: Self-pay

## 2018-02-06 MED ORDER — PERPHENAZINE 4 MG PO TABS: 4.0000 mg | ORAL_TABLET | Freq: Three times a day (TID) | ORAL | 0 refills | Status: DC

## 2018-02-06 MED ORDER — SERTRALINE HCL 50 MG PO TABS: 200.0000 mg | ORAL_TABLET | Freq: Every day | ORAL | 0 refills | Status: DC

## 2018-03-18 DIAGNOSIS — Z23 Encounter for immunization: Secondary | ICD-10-CM | POA: Diagnosis not present

## 2018-03-27 ENCOUNTER — Other Ambulatory Visit: Payer: Self-pay

## 2018-03-27 ENCOUNTER — Encounter (HOSPITAL_COMMUNITY): Payer: Self-pay | Admitting: Emergency Medicine

## 2018-03-27 ENCOUNTER — Emergency Department (HOSPITAL_COMMUNITY)
Admission: EM | Admit: 2018-03-27 | Discharge: 2018-03-28 | Disposition: A | Discharge status: Other Acute Inpatient Hospital | Payer: BLUE CROSS/BLUE SHIELD | Source: Home / Self Care | Attending: Emergency Medicine | Admitting: Emergency Medicine

## 2018-03-27 DIAGNOSIS — F259 Schizoaffective disorder, unspecified: Secondary | ICD-10-CM | POA: Diagnosis not present

## 2018-03-27 DIAGNOSIS — Z8042 Family history of malignant neoplasm of prostate: Secondary | ICD-10-CM | POA: Diagnosis not present

## 2018-03-27 DIAGNOSIS — Z9109 Other allergy status, other than to drugs and biological substances: Secondary | ICD-10-CM | POA: Diagnosis not present

## 2018-03-27 DIAGNOSIS — F251 Schizoaffective disorder, depressive type: Secondary | ICD-10-CM | POA: Diagnosis not present

## 2018-03-27 DIAGNOSIS — F1020 Alcohol dependence, uncomplicated: Secondary | ICD-10-CM

## 2018-03-27 DIAGNOSIS — J45909 Unspecified asthma, uncomplicated: Secondary | ICD-10-CM | POA: Insufficient documentation

## 2018-03-27 DIAGNOSIS — Z818 Family history of other mental and behavioral disorders: Secondary | ICD-10-CM | POA: Diagnosis not present

## 2018-03-27 DIAGNOSIS — F209 Schizophrenia, unspecified: Secondary | ICD-10-CM

## 2018-03-27 DIAGNOSIS — F319 Bipolar disorder, unspecified: Secondary | ICD-10-CM | POA: Insufficient documentation

## 2018-03-27 DIAGNOSIS — F29 Unspecified psychosis not due to a substance or known physiological condition: Secondary | ICD-10-CM | POA: Diagnosis not present

## 2018-03-27 DIAGNOSIS — Z836 Family history of other diseases of the respiratory system: Secondary | ICD-10-CM | POA: Diagnosis not present

## 2018-03-27 DIAGNOSIS — Z8249 Family history of ischemic heart disease and other diseases of the circulatory system: Secondary | ICD-10-CM | POA: Diagnosis not present

## 2018-03-27 DIAGNOSIS — R45851 Suicidal ideations: Secondary | ICD-10-CM

## 2018-03-27 DIAGNOSIS — Z811 Family history of alcohol abuse and dependence: Secondary | ICD-10-CM | POA: Diagnosis not present

## 2018-03-27 DIAGNOSIS — Z79899 Other long term (current) drug therapy: Secondary | ICD-10-CM

## 2018-03-27 DIAGNOSIS — Z825 Family history of asthma and other chronic lower respiratory diseases: Secondary | ICD-10-CM | POA: Diagnosis not present

## 2018-03-27 DIAGNOSIS — F17210 Nicotine dependence, cigarettes, uncomplicated: Secondary | ICD-10-CM

## 2018-03-27 DIAGNOSIS — F332 Major depressive disorder, recurrent severe without psychotic features: Secondary | ICD-10-CM | POA: Diagnosis not present

## 2018-03-27 DIAGNOSIS — Z803 Family history of malignant neoplasm of breast: Secondary | ICD-10-CM | POA: Diagnosis not present

## 2018-03-27 DIAGNOSIS — F419 Anxiety disorder, unspecified: Secondary | ICD-10-CM

## 2018-03-27 DIAGNOSIS — F329 Major depressive disorder, single episode, unspecified: Secondary | ICD-10-CM | POA: Diagnosis not present

## 2018-03-27 DIAGNOSIS — Z833 Family history of diabetes mellitus: Secondary | ICD-10-CM | POA: Diagnosis not present

## 2018-03-27 DIAGNOSIS — Z800 Family history of malignant neoplasm of digestive organs: Secondary | ICD-10-CM | POA: Diagnosis not present

## 2018-03-27 LAB — CBC WITH DIFFERENTIAL/PLATELET
Abs Immature Granulocytes: 0.02 10*3/uL (ref 0.00–0.07)
BASOS ABS: 0.1 10*3/uL (ref 0.0–0.1)
BASOS PCT: 1 %
Eosinophils Absolute: 0.4 10*3/uL (ref 0.0–0.5)
Eosinophils Relative: 6 %
HCT: 48 % (ref 39.0–52.0)
Hemoglobin: 16.2 g/dL (ref 13.0–17.0)
Immature Granulocytes: 0 %
Lymphocytes Relative: 20 %
Lymphs Abs: 1.4 10*3/uL (ref 0.7–4.0)
MCH: 30.2 pg (ref 26.0–34.0)
MCHC: 33.8 g/dL (ref 30.0–36.0)
MCV: 89.6 fL (ref 80.0–100.0)
MONO ABS: 0.5 10*3/uL (ref 0.1–1.0)
Monocytes Relative: 7 %
NEUTROS PCT: 66 %
NRBC: 0 % (ref 0.0–0.2)
Neutro Abs: 4.6 10*3/uL (ref 1.7–7.7)
PLATELETS: 176 10*3/uL (ref 150–400)
RBC: 5.36 MIL/uL (ref 4.22–5.81)
RDW: 13.1 % (ref 11.5–15.5)
WBC: 7 10*3/uL (ref 4.0–10.5)

## 2018-03-27 LAB — COMPREHENSIVE METABOLIC PANEL
ALBUMIN: 4.9 g/dL (ref 3.5–5.0)
ALT: 61 U/L — AB (ref 0–44)
ANION GAP: 9 (ref 5–15)
AST: 34 U/L (ref 15–41)
Alkaline Phosphatase: 55 U/L (ref 38–126)
BUN: 14 mg/dL (ref 6–20)
CHLORIDE: 104 mmol/L (ref 98–111)
CO2: 24 mmol/L (ref 22–32)
CREATININE: 0.92 mg/dL (ref 0.61–1.24)
Calcium: 9.6 mg/dL (ref 8.9–10.3)
GFR calc non Af Amer: >60 mL/min (ref >60)
Glucose, Bld: 93 mg/dL (ref 70–99)
Potassium: 3.8 mmol/L (ref 3.5–5.1)
SODIUM: 137 mmol/L (ref 135–145)
Total Bilirubin: 0.8 mg/dL (ref 0.3–1.2)
Total Protein: 8.4 g/dL — ABNORMAL HIGH (ref 6.5–8.1)

## 2018-03-27 LAB — ETHANOL: Alcohol, Ethyl (B): <10 mg/dL (ref <10)

## 2018-03-27 NOTE — ED Triage Notes (Signed)
Pt states he is out of psych meds x 3 weeks due to not having money. Pt states he wants to die by hanging or slitting his wrists. Pt states he hears people calling his name.

## 2018-03-27 NOTE — ED Provider Notes (Signed)
[facility] EMERGENCY DEPARTMENT Provider Note   CSN: [id number] Arrival date & time: 03/27/18  2118     History   Chief Complaint Chief Complaint  Patient presents with  . V70.1    HPI Leon Mason is a 28 y.o. male.  Patient has a history of schizophrenia.  He is out of his medicines.  He states that he wants to kill himself.  He has plans to either take medicines hang himself or cut himself  The history is provided by the patient.  Altered Mental Status  Presenting symptoms: no behavior changes   Severity:  Severe Most recent episode:  Today Episode history:  Multiple Timing:  Constant Progression:  Unchanged Chronicity:  Recurrent Context: not alcohol use   Associated symptoms: no abdominal pain, no hallucinations, no headaches, no rash and no seizures     Past Medical History:  Diagnosis Date  . Anxiety   . Asthma   . Bipolar 1 disorder ([place])   . Hallucinations   . Schizophrenia [facility])     Patient Active Problem List   Diagnosis Date Noted  . Tobacco use disorder 12/17/2016  . Schizoaffective disorder, bipolar type ([place]) 08/23/2016  . Alcohol use disorder, severe, dependence ([place]) 08/23/2016  . Allergic rhinitis due to pollen 10/09/2015  . 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 Mild persistent allergic asthma 10/09/2015  . Psychosis ([place]) 06/14/2013    Past Surgical History:  Procedure Laterality Date  . [place] EXTRACTION  2009   all four        Home Medications    Prior to Admission medications   Medication Sig Start Date End Date Taking? Authorizing Provider  albuterol (PROVENTIL HFA;VENTOLIN HFA) 108 (90 Base) MCG/ACT inhaler Inhale 2 puffs into the lungs every 6 (six) hours as needed for wheezing or shortness of breath. 12/10/17   [name], DO  benztropine (COGENTIN) 2 MG tablet Take 2 mg by mouth at bedtime.    [provider]  budesonide-formoterol (SYMBICORT) 80-4.5 MCG/ACT inhaler Inhale 2 puffs into the lungs 2  (two) times daily. 12/10/17   [name], DO  gabapentin (NEURONTIN) 100 MG capsule Take 1 capsule (100 mg total) by mouth 3 (three) times daily. 08/30/16   Derrill Center, NP  lithium carbonate (ESKALITH) 450 MG CR tablet Take 1 tablet (450 mg total) by mouth daily. 12/08/17   Shugart, Lissa Hoard, PA-C  loratadine (CLARITIN) 10 MG tablet Take 1 tablet (10 mg total) by mouth daily. 12/10/17   [name], DO  perphenazine (TRILAFON) 4 MG tablet Take 1 tablet (4 mg total) by mouth 3 (three) times daily. 02/06/18   Shugart, Lissa Hoard, PA-C  sertraline (ZOLOFT) 50 MG tablet Take 4 tablets (200 mg total) by mouth daily. 02/06/18   Comer Locket, PA-C    Family History Family History  Problem Relation Age of Onset  . Eczema Brother   . Post-traumatic stress disorder Brother   . COPD Maternal Aunt   . Bipolar disorder Mother   . Alcoholism Mother   . Diabetes Mother   . OCD Father   . Bipolar disorder Father   . Heart attack Maternal Grandmother   . Breast cancer Maternal Grandmother   . Cancer Paternal Grandmother   . Heart attack Paternal Grandfather   . Asthma Neg Hx   . Allergic rhinitis Neg Hx   . Angioedema Neg Hx   . Urticaria Neg Hx   . Immunodeficiency Neg Hx   . Atopy Neg Hx   .  Prostate cancer Neg Hx   . Colon cancer Neg Hx     Social History Social History   Tobacco Use  . Smoking status: Current Every Day Smoker    Packs/day: 0.25    Years: 0.50    Pack years: 0.12    Types: Cigarettes    Last attempt to quit: 09/08/2015    Years since quitting: 2.5  . Smokeless tobacco: Former Network engineer Use Topics  . Alcohol use: Yes    Alcohol/week: 4.0 standard drinks    Types: 4 Cans of beer per week    Comment: 4/5s brandy per week in the last week/ two weeks ago was drinking 8-10 beers daily  . Drug use: No     Allergies   Other   Review of Systems Review of Systems  Constitutional: Negative for appetite change and fatigue.  HENT: Negative for congestion,  ear discharge and sinus pressure.   Eyes: Negative for discharge.  Respiratory: Negative for cough.   Cardiovascular: Negative for chest pain.  Gastrointestinal: Negative for abdominal pain and diarrhea.  Genitourinary: Negative for frequency and hematuria.  Musculoskeletal: Negative for back pain.  Skin: Negative for rash.  Neurological: Negative for seizures and headaches.  Psychiatric/Behavioral: Positive for dysphoric mood and suicidal ideas. Negative for hallucinations.     Physical Exam Updated Vital Signs BP (!) 142/88   Pulse 86   Temp 97.8 F (36.6 C)   Resp 17   Ht 6\' 3"  (1.905 m)   Wt 131.5 kg   SpO2 99%   BMI 36.25 kg/m   Physical Exam Vitals signs and nursing note reviewed.  Constitutional:      Appearance: He is well-developed.  HENT:     Head: Normocephalic.     Nose: Nose normal.  Eyes:     General: No scleral icterus.    Conjunctiva/sclera: Conjunctivae normal.  Neck:     Musculoskeletal: Neck supple.     Thyroid: No thyromegaly.  Cardiovascular:     Rate and Rhythm: Normal rate and regular rhythm.     Heart sounds: No murmur. No friction rub. No gallop.   Pulmonary:     Breath sounds: No stridor. No wheezing or rales.  Chest:     Chest wall: No tenderness.  Abdominal:     General: There is no distension.     Tenderness: There is no abdominal tenderness. There is no rebound.  Musculoskeletal: Normal range of motion.  Lymphadenopathy:     Cervical: No cervical adenopathy.  Skin:    Findings: No erythema or rash.  Neurological:     Mental Status: He is oriented to person, place, and time.     Motor: No abnormal muscle tone.     Coordination: Coordination normal.  Psychiatric:     Comments: Suicidal      ED Treatments / Results  Labs (all labs ordered are listed, but only abnormal results are displayed) Labs Reviewed  COMPREHENSIVE METABOLIC PANEL - Abnormal; Notable for the following components:      Result Value   Total Protein 8.4  (*)    ALT 61 (*)    All other components within normal limits  CBC WITH DIFFERENTIAL/PLATELET  ETHANOL  RAPID URINE DRUG SCREEN, HOSP PERFORMED    EKG None  Radiology No results found.  Procedures Procedures (including critical care time)  Medications Ordered in ED Medications - No data to display   Initial Impression / Assessment and Plan / ED Course  I have  reviewed the triage vital signs and the nursing notes.  Pertinent labs & imaging results that were available during my care of the patient were reviewed by me and considered in my medical decision making (see chart for details).     Patient is medically cleared and waiting behavioral health consult.  Patient is suicidal  Final Clinical Impressions(s) / ED Diagnoses   Final diagnoses:  Suicidal ideation    ED Discharge Orders    None       Milton Ferguson, MD 03/27/18 2320

## 2018-03-28 ENCOUNTER — Other Ambulatory Visit: Payer: Self-pay

## 2018-03-28 ENCOUNTER — Inpatient Hospital Stay (HOSPITAL_COMMUNITY)
Admission: AD | Admit: 2018-03-28 | Discharge: 2018-03-31 | DRG: 885 | Disposition: A | Discharge status: Home / Self Care | Payer: BLUE CROSS/BLUE SHIELD | Source: Intra-hospital | Attending: Psychiatry | Admitting: Psychiatry

## 2018-03-28 ENCOUNTER — Encounter (HOSPITAL_COMMUNITY): Payer: Self-pay | Admitting: *Deleted

## 2018-03-28 ENCOUNTER — Other Ambulatory Visit: Payer: Self-pay | Admitting: Registered Nurse

## 2018-03-28 DIAGNOSIS — J45909 Unspecified asthma, uncomplicated: Secondary | ICD-10-CM | POA: Diagnosis present

## 2018-03-28 DIAGNOSIS — Z811 Family history of alcohol abuse and dependence: Secondary | ICD-10-CM

## 2018-03-28 DIAGNOSIS — Z803 Family history of malignant neoplasm of breast: Secondary | ICD-10-CM

## 2018-03-28 DIAGNOSIS — Z833 Family history of diabetes mellitus: Secondary | ICD-10-CM

## 2018-03-28 DIAGNOSIS — F259 Schizoaffective disorder, unspecified: Principal | ICD-10-CM | POA: Diagnosis present

## 2018-03-28 DIAGNOSIS — F332 Major depressive disorder, recurrent severe without psychotic features: Secondary | ICD-10-CM | POA: Diagnosis present

## 2018-03-28 DIAGNOSIS — R45851 Suicidal ideations: Secondary | ICD-10-CM | POA: Diagnosis not present

## 2018-03-28 DIAGNOSIS — Z825 Family history of asthma and other chronic lower respiratory diseases: Secondary | ICD-10-CM

## 2018-03-28 DIAGNOSIS — F209 Schizophrenia, unspecified: Secondary | ICD-10-CM | POA: Diagnosis present

## 2018-03-28 DIAGNOSIS — Z818 Family history of other mental and behavioral disorders: Secondary | ICD-10-CM

## 2018-03-28 DIAGNOSIS — Z836 Family history of other diseases of the respiratory system: Secondary | ICD-10-CM

## 2018-03-28 DIAGNOSIS — Z8042 Family history of malignant neoplasm of prostate: Secondary | ICD-10-CM

## 2018-03-28 DIAGNOSIS — Z800 Family history of malignant neoplasm of digestive organs: Secondary | ICD-10-CM

## 2018-03-28 DIAGNOSIS — Z8249 Family history of ischemic heart disease and other diseases of the circulatory system: Secondary | ICD-10-CM

## 2018-03-28 DIAGNOSIS — F258 Other schizoaffective disorders: Secondary | ICD-10-CM

## 2018-03-28 DIAGNOSIS — Z9109 Other allergy status, other than to drugs and biological substances: Secondary | ICD-10-CM

## 2018-03-28 HISTORY — DX: Schizoaffective disorder, unspecified: F25.9

## 2018-03-28 LAB — RAPID URINE DRUG SCREEN, HOSP PERFORMED
Amphetamines: NOT DETECTED
BARBITURATES: NOT DETECTED
Benzodiazepines: NOT DETECTED
Cocaine: NOT DETECTED
Opiates: NOT DETECTED
TETRAHYDROCANNABINOL: NOT DETECTED

## 2018-03-28 MED ORDER — ALUM & MAG HYDROXIDE-SIMETH 200-200-20 MG/5ML PO SUSP: 30.0000 mL | ORAL | Status: DC | PRN

## 2018-03-28 MED ORDER — PERPHENAZINE 4 MG PO TABS
4.0000 mg | ORAL_TABLET | Freq: Three times a day (TID) | ORAL | Status: DC
  Administered 2018-03-29 – 2018-03-31 (×8): 4 mg via ORAL
  Filled 2018-03-28 (×14): qty 1

## 2018-03-28 MED ORDER — SERTRALINE HCL 100 MG PO TABS
300.0000 mg | ORAL_TABLET | Freq: Every day | ORAL | Status: DC
  Administered 2018-03-29: 300 mg via ORAL
  Filled 2018-03-28 (×3): qty 3

## 2018-03-28 MED ORDER — ALBUTEROL SULFATE HFA 108 (90 BASE) MCG/ACT IN AERS
2.0000 | INHALATION_SPRAY | Freq: Four times a day (QID) | RESPIRATORY_TRACT | Status: DC | PRN
  Filled 2018-03-28: qty 6.7

## 2018-03-28 MED ORDER — LORATADINE 10 MG PO TABS
10.0000 mg | ORAL_TABLET | Freq: Every day | ORAL | Status: DC
  Administered 2018-03-29 – 2018-03-31 (×3): 10 mg via ORAL
  Filled 2018-03-28 (×6): qty 1

## 2018-03-28 MED ORDER — NICOTINE POLACRILEX 2 MG MT GUM
2.0000 mg | CHEWING_GUM | OROMUCOSAL | Status: DC | PRN
  Administered 2018-03-28 – 2018-03-31 (×4): 2 mg via ORAL

## 2018-03-28 MED ORDER — BENZTROPINE MESYLATE 1 MG PO TABS: 2.0000 mg | ORAL_TABLET | Freq: Every evening | ORAL | Status: DC | PRN

## 2018-03-28 MED ORDER — HYDROXYZINE HCL 25 MG PO TABS
25.0000 mg | ORAL_TABLET | Freq: Four times a day (QID) | ORAL | Status: DC | PRN
  Administered 2018-03-28 – 2018-03-31 (×3): 25 mg via ORAL
  Filled 2018-03-28 (×4): qty 1

## 2018-03-28 MED ORDER — MOMETASONE FURO-FORMOTEROL FUM 100-5 MCG/ACT IN AERO
2.0000 | INHALATION_SPRAY | Freq: Two times a day (BID) | RESPIRATORY_TRACT | Status: DC
  Administered 2018-03-29 – 2018-03-31 (×5): 2 via RESPIRATORY_TRACT
  Filled 2018-03-28 (×2): qty 8.8

## 2018-03-28 MED ORDER — ACETAMINOPHEN 325 MG PO TABS
650.0000 mg | ORAL_TABLET | Freq: Four times a day (QID) | ORAL | Status: DC | PRN
  Administered 2018-03-31: 650 mg via ORAL
  Filled 2018-03-28: qty 2

## 2018-03-28 MED ORDER — ALBUTEROL SULFATE HFA 108 (90 BASE) MCG/ACT IN AERS
2.0000 | INHALATION_SPRAY | Freq: Four times a day (QID) | RESPIRATORY_TRACT | Status: DC
  Administered 2018-03-28 (×2): 2 via RESPIRATORY_TRACT
  Filled 2018-03-28 (×2): qty 6.7

## 2018-03-28 MED ORDER — GABAPENTIN 100 MG PO CAPS
100.0000 mg | ORAL_CAPSULE | Freq: Three times a day (TID) | ORAL | Status: DC
  Administered 2018-03-29 – 2018-03-31 (×8): 100 mg via ORAL
  Filled 2018-03-28 (×14): qty 1

## 2018-03-28 MED ORDER — MAGNESIUM HYDROXIDE 400 MG/5ML PO SUSP: 30.0000 mL | Freq: Every day | ORAL | Status: DC | PRN

## 2018-03-28 MED ORDER — ONE-A-DAY MENS PO TABS: 1.0000 | ORAL_TABLET | Freq: Every day | ORAL | Status: DC

## 2018-03-28 MED ORDER — LITHIUM CARBONATE ER 450 MG PO TBCR
450.0000 mg | EXTENDED_RELEASE_TABLET | Freq: Every day | ORAL | Status: DC
  Administered 2018-03-29 – 2018-03-31 (×3): 450 mg via ORAL
  Filled 2018-03-28 (×6): qty 1

## 2018-03-28 MED ORDER — TRAZODONE HCL 50 MG PO TABS
50.0000 mg | ORAL_TABLET | Freq: Every evening | ORAL | Status: DC | PRN
  Administered 2018-03-28 – 2018-03-30 (×3): 50 mg via ORAL
  Filled 2018-03-28 (×12): qty 1

## 2018-03-28 MED ORDER — ADULT MULTIVITAMIN W/MINERALS CH
1.0000 | ORAL_TABLET | Freq: Every day | ORAL | Status: DC
  Administered 2018-03-29 – 2018-03-31 (×3): 1 via ORAL
  Filled 2018-03-28 (×6): qty 1

## 2018-03-28 NOTE — Progress Notes (Signed)
The patient shared with the group that he was pleased with the fact that he was able to spend time with his peers. He feels safe in this environment. His goal for tomorrow is to come up with some coping skills.

## 2018-03-28 NOTE — Progress Notes (Signed)
Leon Mason is a 28 year old male pt admitted on voluntary basis from [facility]. On admission, he does endorse current SI and depression but is able to contract for safety while in the hospital. He reports that he has not had his medications in the past 3 weeks and reports that he cannot afford them currently. He reports that he was going to [place] for medication follow-up. He reports that he was hospitalized at [facility] in September for similar circumstances. He does report drinking on a frequent basis currently but does not display any signs or symptoms of withdrawal on admission. He reports that he currently lives with his mother and reports that he has been taking care of her for the past few months as she broke her hip and foot and spoke about how she lets him live there as long as he is able to be her caregiver. He reports that he will be returning to the same living situation upon discharge. Tali was escorted to the unit, oriented to the milieu and safety maintained.

## 2018-03-28 NOTE — ED Notes (Signed)
Pt in restroom changing at this time and collecting urine sample .

## 2018-03-28 NOTE — ED Notes (Signed)
Pt being calm and cooperative throughout stay so far.

## 2018-03-28 NOTE — ED Notes (Addendum)
Pt in restroom at this time.

## 2018-03-28 NOTE — Tx Team (Signed)
Initial Treatment Plan 03/28/2018 7:30 PM [name] [id number]    PATIENT STRESSORS: Financial difficulties Marital or family conflict Medication change or noncompliance   PATIENT STRENGTHS: Ability for insight Average or above average intelligence Capable of independent living Communication skills General fund of knowledge Motivation for treatment/growth Physical Health   PATIENT IDENTIFIED PROBLEMS: Depression Suicidal thoughts "When I am properly medicated I feel like I am the best version of myself that I can be"                     DISCHARGE CRITERIA:  Ability to meet basic life and health needs Improved stabilization in mood, thinking, and/or behavior Reduction of life-threatening or endangering symptoms to within safe limits Verbal commitment to aftercare and medication compliance  PRELIMINARY DISCHARGE PLAN: Attend aftercare/continuing care group Return to previous living arrangement  PATIENT/FAMILY INVOLVEMENT: This treatment plan has been presented to and reviewed with the patient, [name], and/or family member, .  The patient and family have been given the opportunity to ask questions and make suggestions.  [place], [place], [place] 03/28/2018, 7:30 PM

## 2018-03-28 NOTE — Progress Notes (Signed)
Pt accepted to [facility], Bed 300-2 Leon Rankin, NP is the accepting provider.  Leon Mason is the attending provider.  Call report to [phone]  [name] notified.   Pt is Voluntary.  Pt may be transported by [facility]  Pt scheduled to arrive at [facility]@1700   [name] T. [facility], MSW, [place] Disposition Clinical Social Work [phone] (cell) [phone] (office)

## 2018-03-28 NOTE — ED Notes (Signed)
Patient remained awake during the night.

## 2018-03-28 NOTE — ED Notes (Addendum)
Belongings placed in locker room. Top shelf . One bag,.

## 2018-03-28 NOTE — ED Notes (Signed)
Called [facility] for transport of Pt to [facility] about 15 min. Ago, to be there at 5p
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
## 2018-03-28 NOTE — ED Provider Notes (Signed)
Accepted by [facility] Dr. France Ravens patient will be transported by [facility].   Fredia Sorrow, MD 03/28/18 1444

## 2018-03-28 NOTE — Progress Notes (Signed)
D   Pt complains of feeling like an electrical charge goes through his body and he said it was because he hasnt been taking his medications   Pt is anxious and restless and is guarded in his interactions with others A   Verbal support given   Medications administered and effectiveness monitored   Q 15 min checks  R   Pt is safe at this time

## 2018-03-29 DIAGNOSIS — R45851 Suicidal ideations: Secondary | ICD-10-CM | POA: Diagnosis not present

## 2018-03-29 DIAGNOSIS — F251 Schizoaffective disorder, depressive type: Secondary | ICD-10-CM | POA: Diagnosis not present

## 2018-03-29 MED ORDER — ONDANSETRON 4 MG PO TBDP
4.0000 mg | ORAL_TABLET | Freq: Three times a day (TID) | ORAL | Status: DC | PRN
  Administered 2018-03-29: 4 mg via ORAL
  Filled 2018-03-29: qty 1

## 2018-03-29 MED ORDER — SERTRALINE HCL 100 MG PO TABS
200.0000 mg | ORAL_TABLET | Freq: Every day | ORAL | Status: DC
  Filled 2018-03-29 (×2): qty 2

## 2018-03-29 MED ORDER — SERTRALINE HCL 50 MG PO TABS
50.0000 mg | ORAL_TABLET | Freq: Every day | ORAL | Status: DC
  Administered 2018-03-30: 50 mg via ORAL
  Filled 2018-03-29 (×3): qty 1

## 2018-03-29 MED ORDER — BENZTROPINE MESYLATE 1 MG PO TABS: 1.0000 mg | ORAL_TABLET | Freq: Every evening | ORAL | Status: DC | PRN
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
## 2018-03-29 NOTE — Progress Notes (Signed)
Recreation Therapy Notes  Date:  1.22.20 Time: 0930 Location: 300 Hall Dayroom  Group Topic: Stress Management  Goal Area(s) Addresses:  Patient will identify positive stress management techniques. Patient will identify benefits of using stress management post d/c.  Behavioral Response:  Engaged  Intervention:  Stress Management  Activity :  Meditation.  LRT introduced the stress management technique of meditation.  LRT played a meditation that dealt positive possibilities which walked patients through looking at each day as a new beginning.  Patients were to follow along as the meditation played to engage in the activity.  Education:  Stress Management, Discharge Planning.   Education Outcome: Acknowledges Education  Clinical Observations/Feedback: Pt attended and participated in group.    Victorino Sparrow, LRT/CTRS     Victorino Sparrow A 03/29/2018 11:27 AM

## 2018-03-29 NOTE — Therapy (Signed)
Occupational Therapy Group Note  Date:  03/29/2018 Time:  1:17 PM  Group Topic/Focus:  Stress Management  Participation Level:  Active  Participation Quality:  Appropriate  Affect:  Flat  Cognitive:  Appropriate  Insight: Improving  Engagement in Group:  Engaged  Modes of Intervention:  Activity, Discussion, Education and Socialization  Additional Comments:    S: "I make music to help cope with stress, I play the strings"  O: Education given on stress management and healthy coping skills. Music provided for pts to choose to help increase relaxation during group. Pt asked to explain the difference between negative and positive coping skills. Encouraged to discuss within group members to increase skill set.   A: Pt presents to group with blunted affect, engaged and participatory. Pt shares that his main negative coping skill is substance use and supression, and he uses music making to cope more effectively. Pt with notable increased affect after music implemented, pt reminiscing on positive memories and experiences and bonding with other group members through music.  P: OT group will be once a week while pt inpatient.   Zenovia Jarred, MSOT, OTR/L Behavioral Health OT/ Acute Relief OT PHP Office: [place] 03/29/2018, 1:17 PM

## 2018-03-29 NOTE — [facility] Suicide Risk Assessment (Signed)
[place] INPATIENT:  Family/Significant Other Suicide Prevention Education  Suicide Prevention Education:  Patient Refusal for Family/Significant Other Suicide Prevention Education: The patient Leon Mason has refused to provide written consent for family/significant other to be provided Family/Significant Other Suicide Prevention Education during admission and/or prior to discharge.  Physician notified.  SPE completed with pt, as pt refused to consent to family contact. SPI pamphlet provided to pt and pt was encouraged to share information with support network, ask questions, and talk about any concerns relating to SPE. Pt denies access to guns/firearms and verbalized understanding of information provided. Mobile Crisis information also provided to pt.   Avelina Laine LCSW 03/29/2018, 10:02 AM

## 2018-03-29 NOTE — Progress Notes (Signed)
Patient ID: Leon Mason, male   DOB: 1991-02-25, 28 y.o.   MRN: [id number]  Pt currently presents with a worried affect and restless behavior. Pt reports to Probation officer that their goal is to gain "coping skills/symptoms of numbness in face/lips, jolts of electricity through my body." He intends to do so by "speak to doctor/attend groups." Pt states "I want to feel better." Pt reports fair sleep with current medication regimen.   Pt provided with medications per providers orders. Pt's labs and vitals were monitored throughout the night. Pt supported emotionally and encouraged to express concerns and questions. Pt educated on medications and suicide safety precautions.   Pt's safety ensured with 15 minute and environmental checks. Pt currently denies HI. Pt endorses ongoing passive SI and A/V hallucinations. Pt verbally agrees to seek staff if HI occurs, SI or A/VH worsens and to consult with staff before acting on any harmful thoughts. Will continue POC.
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
## 2018-03-29 NOTE — Tx Team (Signed)
Interdisciplinary Treatment and Diagnostic Plan Update  03/29/2018 Time of Session: 9:00am [facility] MRN: [id number]  Principal Diagnosis: <principal problem not specified>  Secondary Diagnoses: Active Problems:   Schizophrenia ([place])   MDD (major depressive disorder), recurrent episode, severe ([place])   Schizo-affective schizophrenia, chronic condition with acute exacerbation ([place])   Current Medications:  Current Facility-Administered Medications  Medication Dose Route Frequency Provider Last Rate Last Dose  . acetaminophen (TYLENOL) tablet 650 mg  650 mg Oral Q6H PRN Patriciaann Clan E, PA-C      . albuterol (PROVENTIL HFA;VENTOLIN HFA) 108 (90 Base) MCG/ACT inhaler 2 puff  2 puff Inhalation Q6H PRN Laverle Hobby, PA-C      . alum & mag hydroxide-simeth (MAALOX/MYLANTA) 200-200-20 MG/5ML suspension 30 mL  30 mL Oral Q4H PRN Laverle Hobby, PA-C      . benztropine (COGENTIN) tablet 2 mg  2 mg Oral QHS PRN Laverle Hobby, PA-C      . gabapentin (NEURONTIN) capsule 100 mg  100 mg Oral TID Patriciaann Clan E, PA-C   100 mg at 03/29/18 0800  . hydrOXYzine (ATARAX/VISTARIL) tablet 25 mg  25 mg Oral Q6H PRN Laverle Hobby, PA-C   25 mg at 03/28/18 2219  . lithium carbonate (ESKALITH) CR tablet 450 mg  450 mg Oral Daily Laverle Hobby, PA-C   450 mg at 03/29/18 0800  . loratadine (CLARITIN) tablet 10 mg  10 mg Oral Daily Patriciaann Clan E, PA-C   10 mg at 03/29/18 0800  . magnesium hydroxide (MILK OF MAGNESIA) suspension 30 mL  30 mL Oral Daily PRN Laverle Hobby, PA-C      . mometasone-formoterol (DULERA) 100-5 MCG/ACT inhaler 2 puff  2 puff Inhalation BID Laverle Hobby, PA-C   2 puff at 03/29/18 0759  . multivitamin with minerals tablet 1 tablet  1 tablet Oral Daily Cobos, Myer Peer, MD   1 tablet at 03/29/18 0800  . nicotine polacrilex (NICORETTE) gum 2 mg  2 mg Oral PRN Cobos, Myer Peer, MD   2 mg at 03/28/18 2129  . perphenazine (TRILAFON) tablet 4 mg  4 mg Oral TID Patriciaann Clan E, PA-C   4 mg at 03/29/18 0800  . sertraline (ZOLOFT) tablet 300 mg  300 mg Oral Daily Patriciaann Clan E, PA-C   300 mg at 03/29/18 0800  . traZODone (DESYREL) tablet 50 mg  50 mg Oral QHS,MR X 1 Laverle Hobby, PA-C   50 mg at 03/28/18 2219   PTA Medications: Medications Prior to Admission  Medication Sig Dispense Refill Last Dose  . albuterol (PROVENTIL HFA;VENTOLIN HFA) 108 (90 Base) MCG/ACT inhaler Inhale 2 puffs into the lungs every 6 (six) hours as needed for wheezing or shortness of breath. 1 Inhaler 3 Past Week at Unknown time  . benztropine (COGENTIN) 2 MG tablet Take 2 mg by mouth at bedtime as needed (for restless leg syndrome).    Past Month at Unknown time  . budesonide-formoterol (SYMBICORT) 80-4.5 MCG/ACT inhaler Inhale 2 puffs into the lungs 2 (two) times daily. 1 Inhaler 12 Past Week at Unknown time  . gabapentin (NEURONTIN) 100 MG capsule Take 1 capsule (100 mg total) by mouth 3 (three) times daily. 90 capsule 0 03/26/2018 at Unknown time  . lithium carbonate (ESKALITH) 450 MG CR tablet Take 1 tablet (450 mg total) by mouth daily. 30 tablet 0 03/26/2018 at Unknown time  . loratadine (CLARITIN) 10 MG tablet Take 1 tablet (10 mg total) by  mouth daily. 30 tablet 11 03/25/2018 at Unknown time  . multivitamin (ONE-A-DAY MEN'S) TABS tablet Take 1 tablet by mouth daily.   Past Week at Unknown time  . perphenazine (TRILAFON) 4 MG tablet Take 1 tablet (4 mg total) by mouth 3 (three) times daily. 90 tablet 0 03/26/2018 at Unknown time  . sertraline (ZOLOFT) 50 MG tablet Take 4 tablets (200 mg total) by mouth daily. (Patient taking differently: Take 300 mg by mouth daily. ) 60 tablet 0 Past Month at Unknown time    Patient Stressors: Financial difficulties Marital or family conflict Medication change or noncompliance  Patient Strengths: Ability for insight Average or above average intelligence Capable of independent living Curator fund of  knowledge Motivation for treatment/growth Physical Health  Treatment Modalities: Medication Management, Group therapy, Case management,  1 to 1 session with clinician, Psychoeducation, Recreational therapy.   Physician Treatment Plan for Primary Diagnosis: <principal problem not specified> Long Term Goal(s):     Short Term Goals:    Medication Management: Evaluate patient's response, side effects, and tolerance of medication regimen.  Therapeutic Interventions: 1 to 1 sessions, Unit Group sessions and Medication administration.  Evaluation of Outcomes: Not Met  Physician Treatment Plan for Secondary Diagnosis: Active Problems:   Schizophrenia ([place])   MDD (major depressive disorder), recurrent episode, severe ([place])   Schizo-affective schizophrenia, chronic condition with acute exacerbation ([place])  Long Term Goal(s):     Short Term Goals:       Medication Management: Evaluate patient's response, side effects, and tolerance of medication regimen.  Therapeutic Interventions: 1 to 1 sessions, Unit Group sessions and Medication administration.  Evaluation of Outcomes: Not Met   RN Treatment Plan for Primary Diagnosis: <principal problem not specified> Long Term Goal(s): Knowledge of disease and therapeutic regimen to maintain health will improve  Short Term Goals: Ability to participate in decision making will improve, Ability to verbalize feelings will improve, Ability to disclose and discuss suicidal ideas and Ability to identify and develop effective coping behaviors will improve  Medication Management: RN will administer medications as ordered by provider, will assess and evaluate patient's response and provide education to patient for prescribed medication. RN will report any adverse and/or side effects to prescribing provider.  Therapeutic Interventions: 1 on 1 counseling sessions, Psychoeducation, Medication administration, Evaluate responses to treatment, Monitor vital  signs and CBGs as ordered, Perform/monitor CIWA, COWS, AIMS and Fall Risk screenings as ordered, Perform wound care treatments as ordered.  Evaluation of Outcomes: Not Met   LCSW Treatment Plan for Primary Diagnosis: <principal problem not specified> Long Term Goal(s): Safe transition to appropriate next level of care at discharge, Engage patient in therapeutic group addressing interpersonal concerns.  Short Term Goals: Engage patient in aftercare planning with referrals and resources, Increase social support, Increase ability to appropriately verbalize feelings, Increase emotional regulation, Identify triggers associated with mental health/substance abuse issues and Increase skills for wellness and recovery  Therapeutic Interventions: Assess for all discharge needs, 1 to 1 time with Social worker, Explore available resources and support systems, Assess for adequacy in community support network, Educate family and significant other(s) on suicide prevention, Complete Psychosocial Assessment, Interpersonal group therapy.  Evaluation of Outcomes: Not Met   Progress in Treatment: Attending groups: No. New to unit. Participating in groups: No. Taking medication as prescribed: Yes. Toleration medication: Yes. Family/Significant other contact made: No, will contact:  supports if consents are granted Patient understands diagnosis: Yes. Discussing patient identified problems/goals with staff: No. Medical problems  stabilized or resolved: Yes. Denies suicidal/homicidal ideation: No. Issues/concerns per patient self-inventory: No.  New problem(s) identified: No, Describe:  CSW continuing to assess  New Short Term/Long Term Goal(s): detox, medication management for mood stabilization; elimination of SI thoughts; development of comprehensive mental wellness/sobriety plan.  Patient Goals: "Gain serenity and peace in my life," management of symptoms including "electrical pulses."  Discharge Plan or  Barriers: Continuing to assess. [place] pamphlet, Mobile Crisis information, and AA/NA information provided to patient for additional community support and resources.   Reason for Continuation of Hospitalization: Anxiety Depression Suicidal ideation Withdrawal symptoms  Estimated Length of Stay: 3-5 days  Attendees: Patient: Leon Mason 03/29/2018 8:31 AM  Physician:  03/29/2018 8:31 AM  Nursing:  03/29/2018 8:31 AM  RN Care Manager: 03/29/2018 8:31 AM  Social Worker: Stephanie Acre, Latanya Presser 03/29/2018 8:31 AM  Recreational Therapist:  03/29/2018 8:31 AM  Other:  03/29/2018 8:31 AM  Other:  03/29/2018 8:31 AM  Other: 03/29/2018 8:31 AM    Scribe for Treatment Team: Joellen Jersey, [place] 03/29/2018 8:31 AM
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
## 2018-03-30 DIAGNOSIS — R45851 Suicidal ideations: Secondary | ICD-10-CM | POA: Diagnosis not present

## 2018-03-30 DIAGNOSIS — F251 Schizoaffective disorder, depressive type: Secondary | ICD-10-CM | POA: Diagnosis not present

## 2018-03-30 LAB — LITHIUM LEVEL

## 2018-03-30 LAB — LIPID PANEL
Cholesterol: 221 mg/dL — ABNORMAL HIGH (ref 0–200)
HDL: 35 mg/dL — ABNORMAL LOW (ref >40)
LDL Cholesterol: 147 mg/dL — ABNORMAL HIGH (ref 0–99)
Total CHOL/HDL Ratio: 6.3 RATIO
Triglycerides: 194 mg/dL — ABNORMAL HIGH (ref <150)
VLDL: 39 mg/dL (ref 0–40)

## 2018-03-30 LAB — HEMOGLOBIN A1C
Hgb A1c MFr Bld: 5.1 % (ref 4.8–5.6)
Mean Plasma Glucose: 99.67 mg/dL

## 2018-03-30 LAB — TSH: TSH: 1.671 u[IU]/mL (ref 0.350–4.500)

## 2018-03-30 MED ORDER — SERTRALINE HCL 100 MG PO TABS
100.0000 mg | ORAL_TABLET | Freq: Every day | ORAL | Status: DC
  Administered 2018-03-31: 100 mg via ORAL
  Filled 2018-03-30 (×3): qty 1

## 2018-03-30 NOTE — BHH Group Notes (Signed)
LCSW Group Therapy Note  03/30/2018 2:25 PM  Type of Therapy/Topic:  Group Therapy:  Balance in Life  Participation Level:  Minimal  Description of Group:     This group will address the concept of balance and how it feels and looks when one is unbalanced. Patients will be encouraged to process areas in their lives that are out of balance and identify reasons for remaining unbalanced. Facilitators will guide patients in utilizing problem-solving interventions to address and correct the stressor making their life unbalanced. Understanding and applying boundaries will be explored and addressed for obtaining and maintaining a balanced life. Patients will be encouraged to explore ways to assertively make their unbalanced needs known to significant others in their lives, using other group members and facilitator for support and feedback.  Therapeutic Goals:  1.     Patient will identify two or more emotions or situations they have that consume much of in their lives. 2.     Patient will identify signs/triggers that life has become out of balance: 3.     Patient will identify two ways to set boundaries in order to achieve balance in their lives: 4.     Patient will demonstrate ability to communicate their needs through discussion and/or role plays  Summary of Patient Progress: Leon Mason attended the entire session. He struggled to stay engaged as evidenced by periodic falling asleep. Leon Mason described how his life was out of balance in a few areas. He stated that he could take one step in the right direction by purchasing a car.     Therapeutic Modalities:   Cognitive Behavioral Therapy Solution-Focused Therapy Assertiveness Training  Lawana Pai, MSW Intern [place] Department Phone: [phone] Fax: [phone]

## 2018-03-30 NOTE — Plan of Care (Addendum)
Patient was pleasant upon approach. Denies SI HI AVH. No complaints about medications. Safety maintained with 15 minute checks. Will continue to monitor and provide support.  Problem: Education: Goal: Mental status will improve Outcome: Progressing Goal: Verbalization of understanding the information provided will improve Outcome: Progressing   Problem: Activity: Goal: Interest or engagement in activities will improve Outcome: Progressing Goal: Sleeping patterns will improve Outcome: Progressing   Problem: Coping: Goal: Ability to verbalize frustrations and anger appropriately will improve Outcome: Progressing

## 2018-03-30 NOTE — Progress Notes (Signed)
Nutrition Education Note  Pt attended group focusing on general, healthful nutrition education.  RD emphasized the importance of eating regular meals and snacks throughout the day. Consuming sugar-free beverages and incorporating fruits and vegetables into diet when possible. Provided examples of healthy snacks. Patient encouraged to leave group with a goal to improve nutrition/healthy eating.   Diet Order:  Diet Order           Diet regular Room service appropriate? Yes; Fluid consistency: Thin  Diet effective now         Pt is also offered choice of unit snacks mid-morning and mid-afternoon.  Pt is eating as desired.   If additional nutrition issues arise, please consult RD.    Batoul Limes, MS, RD, LDN, CNSC Inpatient Clinical Dietitian Pager # [phone] After hours/weekend pager # [phone]    

## 2018-03-30 NOTE — Progress Notes (Signed)
D: Patient observed visible in milieu. Patient states, "I'm just worried. I have a lot on my mind. It's critical time. Not sure about my home life. It's not the best situation. I just need to go home and get things together. I need to get a job." Patient's affect worried, anxious, flat, sad with congruent mood.  Denies pain, physical complaints.   A: Medicated per orders, no prns required or requested. Medication education provided. Level III obs in place for safety. Emotional support offered. Patient encouraged to complete Suicide Safety Plan before discharge. Encouraged to attend and participate in unit programming.  R: Patient verbalizes understanding of POC. Patient denies SI/HI/AVH and remains safe on level III obs. Will continue to monitor throughout the night.
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
## 2018-03-31 DIAGNOSIS — R45851 Suicidal ideations: Secondary | ICD-10-CM | POA: Diagnosis not present

## 2018-03-31 DIAGNOSIS — F251 Schizoaffective disorder, depressive type: Secondary | ICD-10-CM | POA: Diagnosis not present

## 2018-03-31 MED ORDER — LITHIUM CARBONATE ER 450 MG PO TBCR: 450.0000 mg | EXTENDED_RELEASE_TABLET | Freq: Every day | ORAL | 0 refills | Status: DC

## 2018-03-31 MED ORDER — GABAPENTIN 100 MG PO CAPS: 100.0000 mg | ORAL_CAPSULE | Freq: Three times a day (TID) | ORAL | 0 refills | Status: DC

## 2018-03-31 MED ORDER — TRAZODONE HCL 50 MG PO TABS: 50.0000 mg | ORAL_TABLET | Freq: Every evening | ORAL | 0 refills | Status: DC | PRN

## 2018-03-31 MED ORDER — LORATADINE 10 MG PO TABS: 10.0000 mg | ORAL_TABLET | Freq: Every day | ORAL | 11 refills | Status: DC

## 2018-03-31 MED ORDER — HYDROXYZINE HCL 25 MG PO TABS: 25.0000 mg | ORAL_TABLET | Freq: Four times a day (QID) | ORAL | 0 refills | Status: DC | PRN

## 2018-03-31 MED ORDER — ALBUTEROL SULFATE HFA 108 (90 BASE) MCG/ACT IN AERS: 2.0000 | INHALATION_SPRAY | Freq: Four times a day (QID) | RESPIRATORY_TRACT | 0 refills | Status: DC | PRN

## 2018-03-31 MED ORDER — MOMETASONE FURO-FORMOTEROL FUM 100-5 MCG/ACT IN AERO: 2.0000 | INHALATION_SPRAY | Freq: Two times a day (BID) | RESPIRATORY_TRACT | Status: DC

## 2018-03-31 MED ORDER — SERTRALINE HCL 100 MG PO TABS: 100.0000 mg | ORAL_TABLET | Freq: Every day | ORAL | 0 refills | Status: DC

## 2018-03-31 MED ORDER — PERPHENAZINE 4 MG PO TABS: 4.0000 mg | ORAL_TABLET | Freq: Three times a day (TID) | ORAL | 0 refills | Status: DC

## 2018-03-31 MED ORDER — BENZTROPINE MESYLATE 1 MG PO TABS: 1.0000 mg | ORAL_TABLET | Freq: Every evening | ORAL | 0 refills | Status: DC | PRN

## 2018-03-31 NOTE — Progress Notes (Signed)
  [facility] Adult Case Management Discharge Plan :  Will you be returning to the same living situation after discharge:  Yes,  home At discharge, do you have transportation home?: Yes,  family member Do you have the ability to pay for your medications: Yes,  BCBS  Release of information consent forms completed and submitted to medical records by CSW.  Patient to Follow up at: Follow-up Information    [facility] Follow up.   Specialty:  Behavioral Health Why:  Patient declined appointment but would like records sent. Please schedule your psychiatry and therapy appt at your earliest convenience. Thank you.  Contact information: [address], [place] [phone]          Next level of care provider has access to [place] and Suicide Prevention discussed: Yes,  SPE complete with pt; SPI pamphlet and mobile crisis information provided to pt  Have you used any form of tobacco in the last 30 days? (Cigarettes, Smokeless Tobacco, Cigars, and/or Pipes): Yes  Has patient been referred to the Quitline?: Patient refused referral  Patient has been referred for addiction treatment: Yes  Avelina Laine, LCSW 03/31/2018, 9:12 AM
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
## 2018-03-31 NOTE — Progress Notes (Signed)
Patient ID: [name], male   DOB: 1991-01-05, 28 y.o.   MRN: [id number]  Pt currently presents with an anxious affect and restless behavior. Pt reports to writer that their goal is to "go home." Pt reports some ongoing neck pain that he thinks may be due to medications. Pt has multiple somatic complaints. Pt reports good sleep with current medication regimen.   Pt provided with medications per providers orders. Pt's labs and vitals were monitored throughout the night. Pt supported emotionally and encouraged to express concerns and questions. Pt educated on medications and suicide prevention precautions. Pt encouraged to follow up with PCP about any side effects from medications post discharge, verbal understanding expressed.   Pt's safety ensured with 15 minute and environmental checks. Pt currently denies SI/HI. Pt reports ongoing A/V hallucinations at baseline. Pt verbally agrees to seek staff if SI/HI occurs, AVH worsens, and to consult with staff before acting on any harmful thoughts. Will continue POC.

## 2018-03-31 NOTE — Discharge Summary (Signed)
Physician Discharge Summary Note  Patient:  Leon Mason is an 28 y.o., male  MRN:  [id number]  DOB:  07/15/1990  Patient phone:  [phone] (home)   Patient address:   [place]. [place] [address],   Total Time spent with patient: Greater than 30 minutes  Date of Admission:  03/28/2018  Date of Discharge: 03-31-18  Reason for Admission: Worsening depression triggering suicidal ideations with plans to cut himself  Principal Problem: Schizo-affective schizophrenia, chronic condition with acute exacerbation [facility])  Discharge Diagnoses: Principal Problem:   Schizo-affective schizophrenia, chronic condition with acute exacerbation ([place]) Active Problems:   Schizophrenia ([place])   MDD (major depressive disorder), recurrent episode, severe ([place])  Past Psychiatric History: Schizoaffective disorder, chronic  Past Medical History:  Past Medical History:  Diagnosis Date  . Anxiety   . Asthma   . Bipolar 1 disorder ([place])   . Hallucinations   . Schizophrenia [facility])     Past Surgical History:  Procedure Laterality Date  . WISDOM TOOTH EXTRACTION  2009   all four   Family History:  Family History  Problem Relation Age of Onset  . Eczema Brother   . Post-traumatic stress disorder Brother   . COPD Maternal Aunt   . Bipolar disorder Mother   . Alcoholism Mother   . Diabetes Mother   . OCD Father   . Bipolar disorder Father   . Heart attack Maternal Grandmother   . Breast cancer Maternal Grandmother   . Cancer Paternal Grandmother   . Heart attack Paternal Grandfather   . Asthma Neg Hx   . Allergic rhinitis Neg Hx   . Angioedema Neg Hx   . Urticaria Neg Hx   . Immunodeficiency Neg Hx   . Atopy Neg Hx   . Prostate cancer Neg Hx   . Colon cancer Neg Hx    Family Psychiatric  History: See H&P  Social History:  Social History   Substance and Sexual Activity  Alcohol Use Yes  . Alcohol/week: 4.0 standard drinks  . Types: 4 Cans of beer per week   Comment:  4/5s brandy per week in the last week/ two weeks ago was drinking 8-10 beers daily     Social History   Substance and Sexual Activity  Drug Use No    Social History   Socioeconomic History  . Marital status: Single    Spouse name: Not on file  . Number of children: Not on file  . Years of education: Not on file  . Highest education level: Not on file  Occupational History  . Not on file  Social Needs  . Financial resource strain: Not on file  . Food insecurity:    Worry: Not on file    Inability: Not on file  . Transportation needs:    Medical: Not on file    Non-medical: Not on file  Tobacco Use  . Smoking status: Current Every Day Smoker    Packs/day: 0.50    Years: 0.50    Pack years: 0.25    Types: Cigarettes  . Smokeless tobacco: Former Network engineer and Sexual Activity  . Alcohol use: Yes    Alcohol/week: 4.0 standard drinks    Types: 4 Cans of beer per week    Comment: 4/5s brandy per week in the last week/ two weeks ago was drinking 8-10 beers daily  . Drug use: No  . Sexual activity: Not Currently  Lifestyle  . Physical activity:  Days per week: Not on file    Minutes per session: Not on file  . Stress: Not on file  Relationships  . Social connections:    Talks on phone: Not on file    Gets together: Not on file    Attends religious service: Not on file    Active member of club or organization: Not on file    Attends meetings of clubs or organizations: Not on file    Relationship status: Not on file  Other Topics Concern  . Not on file  Social History Narrative  . Not on file   Hospital Course: (Per Md's admission SRA): 27, single, no children, lives with mother and a brother, currently unemployed. Presented to ED voluntarily due to to worsening depression, sadness, neuro-vegetative symptoms (low energy level, poor sleep, anhedonia), suicidal ideations with thoughts of cutting self ,which he states has been worsening over recent weeks. He  attributes depression partially to psychosocial stressors- had recently moved in with mother to help her out after a fall, fracture. She has gradually improved. He reports financial situation is poor and that has had difficulty finding a job . He also reports his brother has anger problems and is often threatening. He states he has been off Zoloft, one of his regularly prescribed psychiatric medications, over recent weeks. He reports he has been drinking daily, up to 80 ounces of beer per day. He last drank  a week ago and is not currently not presenting with symptoms of alcohol WDL. Admission UDS and BAL negative EKG NSR, Qtc 420. Reports history of psychiatric illness and states he has been diagnosed with Schizoaffective Disorder , Depressive type. Describes history of psychosis, depression and remote history of catatonia. He states that in general he has been doing better on his currently prescribed medications , which currently are - Zoloft 200 mgrs QDAY, Perphenazine 4 mgrs TID, Lithium 450 mgrs QDAY, Neurontin 100 mgrs TID. Denies side effects. Patient states he has been off Zoloft for several weeks and has been taking other medications at lower doses " because I have been rationing them because I cannot afford to get refills" Medical History remarkable for Asthma. NKDA.  After the above admission assessment, Leon Mason was started on the  medication regimen for his presenting symptoms. He was medicated, stabilized & discharged on the medications as listed below. He was also enrolled & participated in the group counseling sessions being offered & held on this unit. He learned coping skills that should help him after discharge to cope better & maintain mood stability.  During the course of his hospitalization, daily assessment notes marked improvement in his symptoms. He reported feeling good on daily basis. He is pleased with his improvement. Plans to adhere to his treatment regimen after discharge.  No  residual psychotic features. He denies anxiety symptoms. The features of depression has markedly improved since starting medications. No thoughts of violence. No new stressors.    Leon Mason's case was presented during treatment team meeting this morning. The nursing staff reports that patient has been appropriate on the unit. Patient has been interacting well with peers. No behavioral issues. Patient has not voiced any suicidal thoughts. Patient has not been observed to be internally stimulated or preoccupied. Patient has been adherent with treatment recommendations. Patient has been tolerating their medication well.   During care review & discussion of his progress this morning, the treatment team members feel that patient is back to his baseline level of function. The team agrees  with plan to discharge patient today to continue mental health care & medication management on an outpatient basis as noted below. He is provided with all the necessary information needed to make this appointment without any problems. He was able to engage in safety planning including plan to return to [facility] or contact emergency services if he feels unable to maintain hisown safety or the safety of others. He left [facility] with all personal belongings in no apparent distress.  Physical Findings: AIMS: Facial and Oral Movements Muscles of Facial Expression: None, normal Lips and Perioral Area: None, normal Jaw: None, normal Tongue: None, normal,Extremity Movements Upper (arms, wrists, hands, fingers): None, normal Lower (legs, knees, ankles, toes): None, normal, Trunk Movements Neck, shoulders, hips: None, normal, Overall Severity Severity of abnormal movements (highest score from questions above): None, normal Incapacitation due to abnormal movements: None, normal Patient's awareness of abnormal movements (rate only patient's report): No Awareness, Dental Status Current problems with teeth and/or dentures?: No Does patient  usually wear dentures?: No  CIWA:    COWS:     Musculoskeletal: Strength & Muscle Tone: within normal limits Gait & Station: normal Patient leans: N/A  Psychiatric Specialty Exam: Physical Exam  Nursing note and vitals reviewed. Constitutional: He appears well-developed.  HENT:  Head: Normocephalic.  Eyes: Pupils are equal, round, and reactive to light.  Neck: Normal range of motion.  Cardiovascular: Normal rate.  Respiratory: Effort normal.  GI: Soft.  Genitourinary:    Genitourinary Comments: Deferred   Musculoskeletal: Normal range of motion.  Neurological: He is alert.  Skin: Skin is warm.    Review of Systems  Constitutional: Negative.   HENT: Negative.   Eyes: Negative.   Respiratory: Negative.  Negative for cough and shortness of breath.   Cardiovascular: Negative.  Negative for chest pain and palpitations.  Gastrointestinal: Negative.  Negative for abdominal pain, heartburn, nausea and vomiting.  Genitourinary: Negative.   Skin: Negative.   Neurological: Negative.  Negative for dizziness and headaches.  Psychiatric/Behavioral: Positive for depression (Stable). Negative for hallucinations, memory loss, substance abuse and suicidal ideas. The patient has insomnia (Stable). The patient is not nervous/anxious (Stable).     Blood pressure 125/81, pulse 64, temperature 98.6 F (37 C), temperature source Oral, resp. rate 16, height 6\' 3"  (1.905 m), weight 123.8 kg.Body mass index is 34.12 kg/m.  See Md's discharge SRA   Have you used any form of tobacco in the last 30 days? (Cigarettes, Smokeless Tobacco, Cigars, and/or Pipes): Yes  Has this patient used any form of tobacco in the last 30 days? (Cigarettes, Smokeless Tobacco, Cigars, and/or Pipes): Yes, A prescription for an FDA-approved tobacco cessation medication was offered at discharge and the patient refused  Blood Alcohol level:  Lab Results  Component Value Date   [facility] <10 03/27/2018   ETH <5 41/66/0630    Metabolic Disorder Labs:  Lab Results  Component Value Date   HGBA1C 5.1 03/30/2018   MPG 99.67 03/30/2018   MPG 108 08/24/2016   Lab Results  Component Value Date   PROLACTIN 25.4 (H) 08/24/2016   Lab Results  Component Value Date   CHOL 221 (H) 03/30/2018   TRIG 194 (H) 03/30/2018   HDL 35 (L) 03/30/2018   CHOLHDL 6.3 03/30/2018   VLDL 39 03/30/2018   LDLCALC 147 (H) 03/30/2018   LDLCALC UNABLE TO CALCULATE IF TRIGLYCERIDE OVER 400 mg/dL 08/24/2016   See Psychiatric Specialty Exam and Suicide Risk Assessment completed by Attending Physician prior to discharge.  Discharge  destination:  Home  Is patient on multiple antipsychotic therapies at discharge:  No   Has Patient had three or more failed trials of antipsychotic monotherapy by history:  No  Recommended Plan for Multiple Antipsychotic Therapies: NA  Allergies as of 03/31/2018      Reactions   Other Hives, Shortness Of Breath   Dogs and Cats.      Medication List    STOP taking these medications   budesonide-formoterol 80-4.5 MCG/ACT inhaler Commonly known as:  SYMBICORT Replaced by:  mometasone-formoterol 100-5 MCG/ACT Aero   multivitamin Tabs tablet     TAKE these medications     Indication  albuterol 108 (90 Base) MCG/ACT inhaler Commonly known as:  PROVENTIL HFA;VENTOLIN HFA Inhale 2 puffs into the lungs every 6 (six) hours as needed for wheezing or shortness of breath.  Indication:  Asthma   benztropine 1 MG tablet Commonly known as:  COGENTIN Take 1 tablet (1 mg total) by mouth at bedtime as needed (for restless leg syndrome). What changed:    medication strength  how much to take  Indication:  Extrapyramidal Reaction caused by Medications   gabapentin 100 MG capsule Commonly known as:  NEURONTIN Take 1 capsule (100 mg total) by mouth 3 (three) times daily. For agitation What changed:  additional instructions  Indication:  Agitation   hydrOXYzine 25 MG tablet Commonly known as:   ATARAX/VISTARIL Take 1 tablet (25 mg total) by mouth every 6 (six) hours as needed for anxiety.  Indication:  Feeling Anxious   lithium carbonate 450 MG CR tablet Commonly known as:  ESKALITH Take 1 tablet (450 mg total) by mouth daily. For mood stabilization Start taking on:  April 01, 2018 What changed:  additional instructions  Indication:  Mood stabilization   loratadine 10 MG tablet Commonly known as:  CLARITIN Take 1 tablet (10 mg total) by mouth daily. (May buy from over the counter): For allergies What changed:  additional instructions  Indication:  Perennial Allergic Rhinitis, Hayfever   mometasone-formoterol 100-5 MCG/ACT Aero Commonly known as:  DULERA Inhale 2 puffs into the lungs 2 (two) times daily. For shortness of breath Replaces:  budesonide-formoterol 80-4.5 MCG/ACT inhaler  Indication:  Asthma   perphenazine 4 MG tablet Commonly known as:  TRILAFON Take 1 tablet (4 mg total) by mouth 3 (three) times daily. For mood control What changed:  additional instructions  Indication:  Mood control   sertraline 100 MG tablet Commonly known as:  ZOLOFT Take 1 tablet (100 mg total) by mouth daily. For depression Start taking on:  April 01, 2018 What changed:    medication strength  how much to take  additional instructions  Indication:  Major Depressive Disorder   traZODone 50 MG tablet Commonly known as:  DESYREL Take 1 tablet (50 mg total) by mouth at bedtime as needed for sleep.  Indication:  Trouble Sleeping      Follow-up Information    [facility] Follow up.   Specialty:  Behavioral Health Why:  Patient declined appointment but would like records sent. Please schedule your psychiatry and therapy appt at your earliest convenience. Thank you.  Contact information: [address], [place] [phone]         Follow-up recommendations: Activity:  As tolerated Diet: As recommended by  your primary care doctor. Keep all scheduled follow-up appointments as recommended.  Comments: Patient is instructed prior to discharge to: Take all medications as prescribed by his/her mental healthcare  provider. Report any adverse effects and or reactions from the medicines to his/her outpatient provider promptly. Patient has been instructed & cautioned: To not engage in alcohol and or illegal drug use while on prescription medicines. In the event of worsening symptoms, patient is instructed to call the crisis hotline, 911 and or go to the nearest ED for appropriate evaluation and treatment of symptoms. To follow-up with his/her primary care provider for your other medical issues, concerns and or health care needs.   Signed: Lindell Spar, NP, PMHNP, FNP-BC 03/31/2018, 10:11 AM

## 2018-03-31 NOTE — Progress Notes (Signed)
Patient ID: Leon Mason, male   DOB: 1990-10-23, 28 y.o.   MRN: [id number] Pt discharged to lobby. Pt was stable and appreciative at that time. All papers and prescriptions were given and valuables returned. Verbal understanding expressed. Denies SI/HI and A/VH. Pt given opportunity to express concerns and ask questions.

## 2018-03-31 NOTE — Progress Notes (Signed)
Recreation Therapy Notes  Date:  1.24.20 Time: 0930 Location: [address] Dayroom  Group Topic: Stress Management  Goal Area(s) Addresses:  Patient will identify positive stress management techniques. Patient will identify benefits of using stress management post d/c.  Behavioral Response: Engaged  Intervention: Stress Management  Activity :  Meditation.  LRT played a meditation that dealt with having choice.  Patients were to listen to the meditation to follow along and engage.  Education:  Stress Management, Discharge Planning.   Education Outcome: Acknowledges Education  Clinical Observations/Feedback:  Pt attended and participated in group.    Victorino Sparrow, LRT/CTRS         Victorino Sparrow A 03/31/2018 10:52 AM

## 2018-04-26 ENCOUNTER — Encounter (HOSPITAL_COMMUNITY): Payer: Self-pay | Admitting: *Deleted

## 2018-04-26 ENCOUNTER — Emergency Department (HOSPITAL_COMMUNITY): Payer: BLUE CROSS/BLUE SHIELD

## 2018-04-26 ENCOUNTER — Other Ambulatory Visit: Payer: Self-pay

## 2018-04-26 ENCOUNTER — Emergency Department (HOSPITAL_COMMUNITY)
Admission: EM | Admit: 2018-04-26 | Discharge: 2018-04-26 | Disposition: A | Discharge status: Home / Self Care | Payer: BLUE CROSS/BLUE SHIELD | Attending: Emergency Medicine | Admitting: Emergency Medicine

## 2018-04-26 DIAGNOSIS — Z79899 Other long term (current) drug therapy: Secondary | ICD-10-CM | POA: Insufficient documentation

## 2018-04-26 DIAGNOSIS — J4530 Mild persistent asthma, uncomplicated: Secondary | ICD-10-CM | POA: Insufficient documentation

## 2018-04-26 DIAGNOSIS — J988 Other specified respiratory disorders: Secondary | ICD-10-CM | POA: Insufficient documentation

## 2018-04-26 DIAGNOSIS — J4521 Mild intermittent asthma with (acute) exacerbation: Secondary | ICD-10-CM | POA: Insufficient documentation

## 2018-04-26 DIAGNOSIS — B9789 Other viral agents as the cause of diseases classified elsewhere: Secondary | ICD-10-CM

## 2018-04-26 DIAGNOSIS — F17210 Nicotine dependence, cigarettes, uncomplicated: Secondary | ICD-10-CM | POA: Insufficient documentation

## 2018-04-26 HISTORY — DX: Schizoaffective disorder, unspecified: F25.9

## 2018-04-26 LAB — INFLUENZA PANEL BY PCR (TYPE A & B)
Influenza A By PCR: NEGATIVE
Influenza B By PCR: NEGATIVE

## 2018-04-26 MED ORDER — DEXAMETHASONE SODIUM PHOSPHATE 10 MG/ML IJ SOLN: 10.0000 mg | Freq: Once | INTRAMUSCULAR | Status: DC

## 2018-04-26 MED ORDER — ALBUTEROL SULFATE HFA 108 (90 BASE) MCG/ACT IN AERS
1.0000 | INHALATION_SPRAY | RESPIRATORY_TRACT | Status: DC | PRN
  Administered 2018-04-26: 2 via RESPIRATORY_TRACT
  Filled 2018-04-26 (×2): qty 6.7

## 2018-04-26 MED ORDER — ALBUTEROL (5 MG/ML) CONTINUOUS INHALATION SOLN
10.0000 mg/h | INHALATION_SOLUTION | Freq: Once | RESPIRATORY_TRACT | Status: AC
  Administered 2018-04-26: 10 mg/h via RESPIRATORY_TRACT
  Filled 2018-04-26: qty 20

## 2018-04-26 MED ORDER — METHYLPREDNISOLONE SODIUM SUCC 125 MG IJ SOLR
80.0000 mg | Freq: Once | INTRAMUSCULAR | Status: AC
  Administered 2018-04-26: 80 mg via INTRAVENOUS
  Filled 2018-04-26: qty 2

## 2018-04-26 NOTE — Discharge Instructions (Addendum)
You have been diagnosed today with Viral Upper Respiratory Exacerbation and Asthma exacerbation.  At this time there does not appear to be the presence of an emergent medical condition, however there is always the potential for conditions to change. Please read and follow the below instructions.  Please return to the Emergency Department immediately for any new or worsening symptoms or if your symptoms return. Please be sure to follow up with your Primary Care Provider within 5 days regarding your visit today; please call their office to schedule an appointment even if you are feeling better for a follow-up visit. Please drink plenty of water and get plenty of rest to help with your symptoms.  You may use over-the-counter Tylenol as directed on the packaging for fever and body aches. You may use the albuterol refill given you today to help with your symptoms.  Please return to the emergency department immediately if you feel that this medication is not working or that you are using it too often.  Get help right away if you: Feel pain or pressure in your chest. Have shortness of breath. Faint or feel like you will faint. Have severe and persistent vomiting. Feel confused or disoriented. Get help right away if: Your peak flow reading is less than 50% of your personal best. This is in the red zone, which means "danger." You have severe trouble breathing. You develop chest pain or discomfort. Your medicines no longer seem to be helping. You vomit. You cannot eat or drink without vomiting. You are coughing up yellow, green, brown, or bloody mucus. You have a fever and your symptoms suddenly get worse. You have trouble swallowing. You feel very tired, and breathing becomes tiring.  Please read the additional information packets attached to your discharge summary.  Do not take your medicine if  develop an itchy rash, swelling in your mouth or lips, or difficulty breathing.

## 2018-04-26 NOTE — Progress Notes (Signed)
**Note De-Identified  Obfuscation** Flow measured with dial A Check system

## 2018-04-26 NOTE — ED Provider Notes (Signed)
[facility] EMERGENCY DEPARTMENT Provider Note   CSN: [id number] Arrival date & time: 04/26/18  7510    History   Chief Complaint Chief Complaint  Patient presents with  . Fever    HPI Leon Mason is a 28 y.o. male presenting today for 24-hour history of fever, generalized body aches and difficulty breathing.  Patient reports that he has a history of asthma and feels that he is having an asthma exacerbation today.  He reports that he used his home albuterol x2 without relief yesterday.  Patient reports having fever of 100 F yesterday as well.  He describes generalized body aches of all of his joints as a mild throbbing sensation constant worsened with movement. He denies focal pain. He denies chest pain, hemoptysis, extremity swelling/color change, recent surgery/immobilization, history of cancer or history of blood clot.  Patient reports that he took NyQuil yesterday with temporary relief of his symptoms.  He is requesting breathing treatment today in emergency department.    HPI  Past Medical History:  Diagnosis Date  . Anxiety   . Asthma   . Bipolar 1 disorder ([place])   . Hallucinations   . Schizoaffective disorder ([place])   . Schizophrenia [facility])     Patient Active Problem List   Diagnosis Date Noted  . Schizophrenia ([place]) 03/28/2018  . MDD (major depressive disorder), recurrent episode, severe ([place]) 03/28/2018  . Schizo-affective schizophrenia, chronic condition with acute exacerbation ([place]) 03/28/2018  . Tobacco use disorder 12/17/2016  . Schizoaffective disorder, bipolar type ([place]) 08/23/2016  . Alcohol use disorder, severe, dependence ([place]) 08/23/2016  . Allergic rhinitis due to pollen 10/09/2015  . 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 Mild persistent allergic asthma 10/09/2015  . Psychosis ([place]) 06/14/2013    Past Surgical History:  Procedure Laterality Date  . [place] EXTRACTION  2009   all four        Home Medications    Prior to Admission  medications   Medication Sig Start Date End Date Taking? Authorizing Provider  albuterol (PROVENTIL HFA;VENTOLIN HFA) 108 (90 Base) MCG/ACT inhaler Inhale 2 puffs into the lungs every 6 (six) hours as needed for wheezing or shortness of breath. 03/31/18  Yes Nwoko, Herbert Pun I, NP  benztropine (COGENTIN) 1 MG tablet Take 1 tablet (1 mg total) by mouth at bedtime as needed (for restless leg syndrome). 03/31/18  Yes Lindell Spar I, NP  gabapentin (NEURONTIN) 100 MG capsule Take 1 capsule (100 mg total) by mouth 3 (three) times daily. For agitation 03/31/18  Yes Lindell Spar I, NP  hydrOXYzine (ATARAX/VISTARIL) 25 MG tablet Take 1 tablet (25 mg total) by mouth every 6 (six) hours as needed for anxiety. 03/31/18  Yes Lindell Spar I, NP  lithium carbonate (ESKALITH) 450 MG CR tablet Take 1 tablet (450 mg total) by mouth daily. For mood stabilization 04/01/18  Yes Nwoko, Herbert Pun I, NP  loratadine (CLARITIN) 10 MG tablet Take 1 tablet (10 mg total) by mouth daily. (May buy from over the counter): For allergies 03/31/18  Yes Nwoko, Herbert Pun I, NP  mometasone-formoterol (DULERA) 100-5 MCG/ACT AERO Inhale 2 puffs into the lungs 2 (two) times daily. For shortness of breath 03/31/18  Yes Nwoko, Herbert Pun I, NP  perphenazine (TRILAFON) 4 MG tablet Take 1 tablet (4 mg total) by mouth 3 (three) times daily. For mood control 03/31/18  Yes Lindell Spar I, NP  sertraline (ZOLOFT) 100 MG tablet Take 1 tablet (100 mg total) by mouth daily. For depression 04/01/18  Yes Lindell Spar  I, NP  traZODone (DESYREL) 50 MG tablet Take 1 tablet (50 mg total) by mouth at bedtime as needed for sleep. 03/31/18  Yes Encarnacion Slates, NP    Family History Family History  Problem Relation Age of Onset  . Eczema Brother   . Post-traumatic stress disorder Brother   . COPD Maternal Aunt   . Bipolar disorder Mother   . Alcoholism Mother   . Diabetes Mother   . OCD Father   . Bipolar disorder Father   . Heart attack Maternal Grandmother   . Breast cancer  Maternal Grandmother   . Cancer Paternal Grandmother   . Heart attack Paternal Grandfather   . Asthma Neg Hx   . Allergic rhinitis Neg Hx   . Angioedema Neg Hx   . Urticaria Neg Hx   . Immunodeficiency Neg Hx   . Atopy Neg Hx   . Prostate cancer Neg Hx   . Colon cancer Neg Hx     Social History Social History   Tobacco Use  . Smoking status: Current Every Day Smoker    Packs/day: 0.50    Years: 0.50    Pack years: 0.25    Types: Cigarettes  . Smokeless tobacco: Former Network engineer Use Topics  . Alcohol use: Yes    Alcohol/week: 4.0 standard drinks    Types: 4 Cans of beer per week    Comment: 4/5s brandy per week in the last week/ two weeks ago was drinking 8-10 beers daily  . Drug use: No     Allergies   Other and Pollen extract   Review of Systems Review of Systems  Constitutional: Positive for fever. Negative for chills and diaphoresis.  HENT: Positive for rhinorrhea. Negative for sore throat, trouble swallowing and voice change.   Eyes: Negative.   Respiratory: Positive for shortness of breath. Negative for cough.   Cardiovascular: Negative.  Negative for chest pain and leg swelling.  Gastrointestinal: Negative.  Negative for abdominal pain, blood in stool, diarrhea, nausea and vomiting.  Musculoskeletal: Positive for arthralgias and myalgias. Negative for neck stiffness.  Neurological: Negative.  Negative for dizziness, syncope and weakness.  All other systems reviewed and are negative.  Physical Exam Updated Vital Signs BP 120/64 (BP Location: Right Arm)   Pulse 93   Temp 98.3 F (36.8 C) (Oral)   Resp 20   Ht 6\' 3"  (1.905 m)   Wt 124.7 kg   SpO2 97%   BMI 34.37 kg/m   Physical Exam Constitutional:      General: He is not in acute distress.    Appearance: Normal appearance. He is well-developed. He is not ill-appearing or diaphoretic.  HENT:     Head: Normocephalic and atraumatic.     Right Ear: External ear normal.     Left Ear: External  ear normal.     Nose: Rhinorrhea present. Rhinorrhea is clear.     Comments: The patient has normal phonation and is in control of secretions. No stridor.  Midline uvula without edema. Soft palate rises symmetrically. No tonsillar erythema, swelling or exudates. Tongue protrusion is normal, floor of mouth is soft. No trismus. No creptius on neck palpation.No gingival erythema or fluctuance noted. Mucus membranes moist.    Mouth/Throat:     Lips: Pink.     Mouth: Mucous membranes are moist.     Pharynx: Oropharynx is clear.  Eyes:     General: Vision grossly intact. Gaze aligned appropriately.     Pupils: Pupils  are equal, round, and reactive to light.  Neck:     Musculoskeletal: Full passive range of motion without pain, normal range of motion and neck supple.     Trachea: Trachea and phonation normal. No tracheal deviation.  Cardiovascular:     Rate and Rhythm: Normal rate and regular rhythm.     Pulses:          Dorsalis pedis pulses are 2+ on the right side and 2+ on the left side.       Posterior tibial pulses are 2+ on the right side and 2+ on the left side.     Heart sounds: Normal heart sounds.  Pulmonary:     Effort: Pulmonary effort is normal. No respiratory distress.     Breath sounds: Normal air entry. Wheezing present.     Comments: Moderate bilateral wheezing. Chest:     Chest wall: No deformity, tenderness or crepitus.  Abdominal:     General: Bowel sounds are normal. There is no distension.     Palpations: Abdomen is soft.     Tenderness: There is no abdominal tenderness. There is no guarding or rebound.  Musculoskeletal: Normal range of motion.     Right lower leg: Normal. He exhibits no tenderness. No edema.     Left lower leg: Normal. He exhibits no tenderness. No edema.  Feet:     Right foot:     Protective Sensation: 3 sites tested. 3 sites sensed.     Left foot:     Protective Sensation: 3 sites tested. 3 sites sensed.  Skin:    General: Skin is warm and  dry.  Neurological:     Mental Status: He is alert.     GCS: GCS eye subscore is 4. GCS verbal subscore is 5. GCS motor subscore is 6.     Comments: Speech is clear and goal oriented, follows commands Major Cranial nerves without deficit, no facial droop Moves extremities without ataxia, coordination intact  Psychiatric:        Behavior: Behavior normal.    ED Treatments / Results  Labs (all labs ordered are listed, but only abnormal results are displayed) Labs Reviewed  INFLUENZA PANEL BY PCR (TYPE A & B)    EKG None  Radiology Dg Chest 2 View  Result Date: 04/26/2018 CLINICAL DATA:  Shortness of breath and fever over the last day. Smoking history. EXAM: CHEST - 2 VIEW COMPARISON:  12/25/2014 FINDINGS: Heart size is normal. Mediastinal shadows are normal. There is central bronchial thickening but no infiltrate, collapse or effusion. No bone abnormality. IMPRESSION: Bronchitis pattern.  No consolidation or collapse. Electronically Signed   By: Nelson Chimes M.D.   On: 04/26/2018 10:05    Procedures Procedures (including critical care time)  Medications Ordered in ED Medications  albuterol (PROVENTIL HFA;VENTOLIN HFA) 108 (90 Base) MCG/ACT inhaler 1-2 puff (2 puffs Inhalation Given 04/26/18 1207)  albuterol (PROVENTIL,VENTOLIN) solution continuous neb (10 mg/hr Nebulization Given 04/26/18 0946)  methylPREDNISolone sodium succinate (SOLU-MEDROL) 125 mg/2 mL injection 80 mg (80 mg Intravenous Given 04/26/18 0957)     Initial Impression / Assessment and Plan / ED Course  I have reviewed the triage vital signs and the nursing notes.  Pertinent labs & imaging results that were available during my care of the patient were reviewed by me and considered in my medical decision making (see chart for details).    28 year old male without history immunocompromising diseases presents today for asthma exacerbation, fever and generalized body aches  that began yesterday.  On arrival patient  with bilateral expiratory wheezing however overall well-appearing, nontoxic and in no acute distress.  Chest x-ray: IMPRESSION: Bronchitis pattern.  No consolidation or collapse. Influenza panel negative  Patient given Solu-Medrol and albuterol nebulizer.  Patient seen and evaluated by Dr. Lacinda Axon who agrees with plan of care at this time. - Patient reassessed, resting comfortably in no acute distress.  He is sleeping comfortably, easily arousable to voice.  Lungs clear to auscultation bilaterally and patient states that he is now breathing at baseline, does not wish to have additional breathing treatment and is requesting discharge.  He again denies chest pain or shortness of breath on reevaluation.   At discharge patient is afebrile, not tachycardic, not tachypneic, with SPO2 greater than 95% on room air.  Patient states understanding of care plan today and is agreeable with discharge.  - Suspect patient symptoms today related to viral upper respiratory tract infection/bronchitis.  Do not suspect acute cardiopulmonary etiology for patient symptoms today.  No antibiotics indicated at this time.  Patient given refill of his home albuterol inhaler and instructions.  At this time there does not appear to be any evidence of an acute emergency medical condition and the patient appears stable for discharge with appropriate outpatient follow up. Diagnosis was discussed with patient who verbalizes understanding of care plan and is agreeable to discharge. I have discussed return precautions with patient who verbalizes understanding of return precautions. Patient strongly encouraged to follow-up with their PCP within 5 days. All questions answered.  Patient's case rediscussed with Dr. Lacinda Axon who agrees with plan to discharge with follow-up.   Note: Portions of this report may have been transcribed using voice recognition software. Every effort was made to ensure accuracy; however, inadvertent computerized  transcription errors may still be present. Final Clinical Impressions(s) / ED Diagnoses   Final diagnoses:  Viral respiratory illness  Mild intermittent asthma with exacerbation    ED Discharge Orders    None       Gari Crown 04/26/18 1218    Nat Christen, MD 04/26/18 [phone]

## 2018-04-26 NOTE — ED Triage Notes (Signed)
Fever, body aches and difficulty breathing onset yesterday

## 2018-05-03 ENCOUNTER — Telehealth: Payer: Self-pay | Admitting: Psychiatry

## 2018-05-03 NOTE — Telephone Encounter (Signed)
Pt has been taking perphenazine for awhile and he said his rx went up to$96 which he cannot afford. He wants to know if we have any samples, or what would be his best option. He is starting to go through withdraws.

## 2018-05-15 ENCOUNTER — Telehealth: Payer: Self-pay | Admitting: Psychiatry

## 2018-05-15 NOTE — Telephone Encounter (Signed)
Patient left vm 03/06 stating his medications were too expensive, he tried using the [facility] and only brought it down to $24 patient never mentioned which medication would like for you to call him regarding this [phone]

## 2018-05-16 NOTE — Telephone Encounter (Signed)
Call to Leon Mason  On perphenazine        Lithium      zoloft We may change his lithium to 300mg  bid instead of 450 xl

## 2018-05-17 ENCOUNTER — Other Ambulatory Visit: Payer: Self-pay

## 2018-05-17 MED ORDER — LITHIUM CARBONATE ER 450 MG PO TBCR: 450.0000 mg | EXTENDED_RELEASE_TABLET | Freq: Every day | ORAL | 0 refills | Status: DC

## 2018-05-17 MED ORDER — SERTRALINE HCL 100 MG PO TABS: 100.0000 mg | ORAL_TABLET | Freq: Every day | ORAL | 0 refills | Status: DC

## 2018-05-17 NOTE — Progress Notes (Signed)
Pt requesting refill for lithium 450mg  CR and Sertraline 100mg  2/day. Instructed pt he needs to schedule follow up asap. Only will receive 30 day supply of both medications. Pt aware and he lost his insurance, encouraged to go to [facility] if not able to pay out of pocket. Asking what charge would be. Will refer to administration staff to discuss. rx's submitted to [facility] per request.

## 2018-06-02 ENCOUNTER — Telehealth: Payer: Self-pay | Admitting: Family Medicine

## 2018-06-02 DIAGNOSIS — J029 Acute pharyngitis, unspecified: Secondary | ICD-10-CM

## 2018-06-02 MED ORDER — AMOXICILLIN 500 MG PO CAPS: 500.0000 mg | ORAL_CAPSULE | Freq: Two times a day (BID) | ORAL | 0 refills | Status: AC

## 2018-06-02 NOTE — Telephone Encounter (Signed)
[facility] FAMILY MEDICINE  SWITCHBOARD  SICK CALL SCREENING   06/02/2018  Leon Mason    OZD:[id number]    DOB:03-01-1991  Best Contact Telephone Number: [phone]   4. Symptoms: Tonsils are swollen, sore throat, weak/tired  2. Symptom Onset: 06/02/2018  3. Have you traveled any over the past 14 days? No  4.   Have you been in recent contact with someone that has tested positive for COVID-19? 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   Which pharmacy would you use today if given a prescription? [facility] was informed that this information will be given to a clinical staff member to review and that they should receive a follow-up telephone call within 24 hours. he was advised to call back if he develops any new symptoms or if his current symptoms worsen.   Screened by: Sandra Cockayne

## 2018-06-02 NOTE — Telephone Encounter (Signed)
Telephone visit  Subjective: CC: sore throat PCP: [name], DO AYT:Leon Mason A Mccarley is a 28 y.o. male calls for telephone consult today. Patient provides verbal consent for consult held via phone.  Location of patient: home Location of provider: [facility] Others present for call: none  1. Sore throat Patient reports abrupt onset of sore throat this morning.  He reports associated nausea but states that he had been drinking last evening so not sure if this is associated with a sore throat or separately.  Denies any vomiting, fevers, rash, known sick contacts.  No cough, shortness of breath or wheeze.  He had bronchitis about a month and a half ago but those symptoms have resolved.  He started Aler-Tab 10 mg today.  No change in symptoms thus far.   ROS: Per HPI  Allergies  Allergen Reactions  . Other Hives and Shortness Of Breath    Dogs and Cats.  . Pollen Extract    Past Medical History:  Diagnosis Date  . Anxiety   . Asthma   . Bipolar 1 disorder ([place])   . Hallucinations   . Schizoaffective disorder ([place])   . Schizophrenia ([place])     Current Outpatient Medications:  .  albuterol (PROVENTIL HFA;VENTOLIN HFA) 108 (90 Base) MCG/ACT inhaler, Inhale 2 puffs into the lungs every 6 (six) hours as needed for wheezing or shortness of breath., Disp: 1 Inhaler, Rfl: 0 .  benztropine (COGENTIN) 1 MG tablet, Take 1 tablet (1 mg total) by mouth at bedtime as needed (for restless leg syndrome)., Disp: 30 tablet, Rfl: 0 .  gabapentin (NEURONTIN) 100 MG capsule, Take 1 capsule (100 mg total) by mouth 3 (three) times daily. For agitation, Disp: 90 capsule, Rfl: 0 .  hydrOXYzine (ATARAX/VISTARIL) 25 MG tablet, Take 1 tablet (25 mg total) by mouth every 6 (six) hours as needed for anxiety., Disp: 60 tablet, Rfl: 0 .  lithium carbonate (ESKALITH) 450 MG CR tablet, Take 1 tablet (450 mg total) by mouth daily., Disp: 30 tablet, Rfl: 0 .  loratadine (CLARITIN) 10 MG tablet, Take 1 tablet (10 mg  total) by mouth daily. (May buy from over the counter): For allergies, Disp: 30 tablet, Rfl: 11 .  mometasone-formoterol (DULERA) 100-5 MCG/ACT AERO, Inhale 2 puffs into the lungs 2 (two) times daily. For shortness of breath, Disp: , Rfl:  .  perphenazine (TRILAFON) 4 MG tablet, Take 1 tablet (4 mg total) by mouth 3 (three) times daily. For mood control, Disp: 90 tablet, Rfl: 0 .  sertraline (ZOLOFT) 100 MG tablet, Take 1 tablet (100 mg total) by mouth daily., Disp: 60 tablet, Rfl: 0 .  traZODone (DESYREL) 50 MG tablet, Take 1 tablet (50 mg total) by mouth at bedtime as needed for sleep., Disp: 30 tablet, Rfl: 0  Assessment/ Plan: 28 y.o. male   1. Pharyngitis, unspecified etiology Uncertain etiology.  Given concomitant nausea will empirically treat with amoxicillin.  I advised him to hold off for a couple of days to see if symptoms improve with supportive therapy: salt water gargles, Tylenol and use of oral antihistamine.  We discussed reasons to start medication sooner than Monday including continued nausea, rash, headache or worsening sore throat.  He voiced good understanding will follow-up PRN. - amoxicillin (AMOXIL) 500 MG capsule; Take 1 capsule (500 mg total) by mouth 2 (two) times daily for 10 days.  Dispense: 20 capsule; Refill: 0   Start time: 4:50pm End time: 4:55pm  No orders of the defined types were placed  in this encounter.   [name], DO [place] [phone]

## 2018-06-28 ENCOUNTER — Ambulatory Visit (INDEPENDENT_AMBULATORY_CARE_PROVIDER_SITE_OTHER): Payer: BLUE CROSS/BLUE SHIELD | Admitting: Physician Assistant

## 2018-06-28 ENCOUNTER — Other Ambulatory Visit: Payer: Self-pay

## 2018-06-28 ENCOUNTER — Encounter: Payer: Self-pay | Admitting: Physician Assistant

## 2018-06-28 DIAGNOSIS — Z79899 Other long term (current) drug therapy: Secondary | ICD-10-CM

## 2018-06-28 DIAGNOSIS — F203 Undifferentiated schizophrenia: Secondary | ICD-10-CM

## 2018-06-28 DIAGNOSIS — F411 Generalized anxiety disorder: Secondary | ICD-10-CM

## 2018-06-28 DIAGNOSIS — G4700 Insomnia, unspecified: Secondary | ICD-10-CM

## 2018-06-28 MED ORDER — LITHIUM CARBONATE ER 450 MG PO TBCR: 450.0000 mg | EXTENDED_RELEASE_TABLET | Freq: Every day | ORAL | 2 refills | Status: DC

## 2018-06-28 MED ORDER — BENZTROPINE MESYLATE 1 MG PO TABS: 1.0000 mg | ORAL_TABLET | Freq: Every evening | ORAL | 2 refills | Status: DC | PRN

## 2018-06-28 MED ORDER — PERPHENAZINE 4 MG PO TABS: 4.0000 mg | ORAL_TABLET | Freq: Three times a day (TID) | ORAL | 2 refills | Status: DC

## 2018-06-28 MED ORDER — SERTRALINE HCL 100 MG PO TABS: 200.0000 mg | ORAL_TABLET | Freq: Every day | ORAL | 2 refills | Status: DC

## 2018-06-28 MED ORDER — TRAZODONE HCL 50 MG PO TABS: 50.0000 mg | ORAL_TABLET | Freq: Every evening | ORAL | 2 refills | Status: DC | PRN

## 2018-06-28 MED ORDER — GABAPENTIN 100 MG PO CAPS: 100.0000 mg | ORAL_CAPSULE | Freq: Three times a day (TID) | ORAL | 2 refills | Status: DC

## 2018-06-28 NOTE — Progress Notes (Signed)
[facility] Med Check  Patient ID: Leon Mason,  MRN: [id number]  PCP: [name], DO  Date of Evaluation: 06/28/2018 Time spent:15 minutes  Chief Complaint:  Chief Complaint    Follow-up     Virtual Visit via Telephone Note  I connected with patient by a video enabled telemedicine application or telephone, with their informed consent, and verified patient privacy and that I am speaking with the correct person using two identifiers.  I am private, in my home and the patient is at home.  I discussed the limitations, risks, security and privacy concerns of performing an evaluation and management service by telephone and the availability of in person appointments. I also discussed with the patient that there may be a patient responsible charge related to this service. The patient expressed understanding and agreed to proceed.   I discussed the assessment and treatment plan with the patient. The patient was provided an opportunity to ask questions and all were answered. The patient agreed with the plan and demonstrated an understanding of the instructions.   The patient was advised to call back or seek an in-person evaluation if the symptoms worsen or if the condition fails to improve as anticipated.  I provided 15   minutes of non-face-to-face time during this encounter.  HISTORY/CURRENT STATUS: HPI for routine med check.  Patient reports he has been doing really well.  He feels that current medication are the best "cocktail" that he has ever been on.  He denies any hallucinations.  States his mood has been very stable.  Reports he is drinking too much alcohol.  "I kind of have a problem with that but I am not ready to quit."  He sleeps pretty well.  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   Patient denies loss of interest in usual activities and is able to enjoy things.  Denies decreased energy or motivation.  Appetite has not  changed.  No extreme sadness, tearfulness, or feelings of hopelessness.  Denies any changes in concentration, making decisions or remembering things.  Denies suicidal or homicidal thoughts.  Patient denies increased energy with decreased need for sleep, no increased talkativeness, no racing thoughts, no impulsivity or risky behaviors, no increased spending, no increased libido, no grandiosity.  Denies muscle or joint pain, stiffness, or dystonia.  Denies dizziness, syncope, seizures, numbness, tingling, tremor, tics, unsteady gait, slurred speech, confusion.   Individual Medical History/ Review of Systems: Changes? :No    Past medications for mental health diagnoses include: Unknown  Allergies: Other and Pollen extract  Current Medications:  Current Outpatient Medications:  .  albuterol (PROVENTIL HFA;VENTOLIN HFA) 108 (90 Base) MCG/ACT inhaler, Inhale 2 puffs into the lungs every 6 (six) hours as needed for wheezing or shortness of breath., Disp: 1 Inhaler, Rfl: 0 .  benztropine (COGENTIN) 1 MG tablet, Take 1 tablet (1 mg total) by mouth at bedtime as needed (for restless leg syndrome)., Disp: 30 tablet, Rfl: 2 .  cholecalciferol (VITAMIN D3) 25 MCG (1000 UT) tablet, Take 1,000 Units by mouth daily., Disp: , Rfl:  .  gabapentin (NEURONTIN) 100 MG capsule, Take 1 capsule (100 mg total) by mouth 3 (three) times daily. For agitation, Disp: 90 capsule, Rfl: 2 .  lithium carbonate (ESKALITH) 450 MG CR tablet, Take 1 tablet (450 mg total) by mouth daily., Disp: 30 tablet, Rfl: 2 .  loratadine (CLARITIN) 10 MG tablet, Take 1 tablet (10 mg total) by  mouth daily. (May buy from over the counter): For allergies, Disp: 30 tablet, Rfl: 11 .  Multiple Vitamin (MULTIVITAMIN) tablet, Take 1 tablet by mouth daily., Disp: , Rfl:  .  perphenazine (TRILAFON) 4 MG tablet, Take 1 tablet (4 mg total) by mouth 3 (three) times daily. For mood control, Disp: 90 tablet, Rfl: 2 .  sertraline (ZOLOFT) 100 MG tablet,  Take 2 tablets (200 mg total) by mouth daily., Disp: 60 tablet, Rfl: 2 .  traZODone (DESYREL) 50 MG tablet, Take 1 tablet (50 mg total) by mouth at bedtime as needed for sleep., Disp: 30 tablet, Rfl: 2 .  hydrOXYzine (ATARAX/VISTARIL) 25 MG tablet, Take 1 tablet (25 mg total) by mouth every 6 (six) hours as needed for anxiety. (Patient not taking: Reported on 06/28/2018), Disp: 60 tablet, Rfl: 0 .  mometasone-formoterol (DULERA) 100-5 MCG/ACT AERO, Inhale 2 puffs into the lungs 2 (two) times daily. For shortness of breath (Patient not taking: Reported on 06/28/2018), Disp: , Rfl:  Medication Side Effects: none  Family Medical/ Social History: Changes? No  MENTAL HEALTH EXAM:  There were no vitals taken for this visit.There is no height or weight on file to calculate BMI.  General Appearance: phone visit, unable to assess  Eye Contact:  unable to visit  Speech:  Clear and Coherent  Volume:  Normal  Mood:  Euthymic  Affect:  unable to assess  Thought Process:  Goal Directed  Orientation:  Full (Time, Place, and Person)  Thought Content: Logical   Suicidal Thoughts:  No  Homicidal Thoughts:  No  Memory:  WNL  Judgement:  Good  Insight:  Good  Psychomotor Activity:  unable to assess  Concentration:  Concentration: Good  Recall:  Good  Fund of Knowledge: Good  Language: Good  Assets:  Desire for Improvement  ADL's:  Intact  Cognition: WNL  Prognosis:  Good    DIAGNOSES:    ICD-10-CM   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 Encounter for long-term (current) use of medications Z79.899 TSH    Lithium level    Comprehensive metabolic panel  3. Insomnia, unspecified type G47.00   4. Generalized anxiety disorder F41.1     Receiving Psychotherapy: No    RECOMMENDATIONS:  Continue Cogentin 1 mg nightly. Continue gabapentin 100 mg 3 times daily. Continue lithium CR 450 mg daily. Continue perphenazine 4 mg 3 times daily. Continue Zoloft 200 mg daily. Continue  trazodone 50 mg nightly as needed sleep. Draw CMP, lithium level, TSH, and importance of these labs were stressed to the patient. Return in 3 months or sooner as needed.   Donnal Moat, PA-C   This record has been created using [employer].  Chart creation errors have been sought, but may not always have been located and corrected. Such creation errors do not reflect on the standard of medical care.

## 2018-07-04 ENCOUNTER — Other Ambulatory Visit: Payer: Self-pay

## 2018-07-04 ENCOUNTER — Ambulatory Visit (INDEPENDENT_AMBULATORY_CARE_PROVIDER_SITE_OTHER): Payer: Medicaid Other | Admitting: Family Medicine

## 2018-07-04 ENCOUNTER — Encounter: Payer: Self-pay | Admitting: Family Medicine

## 2018-07-04 DIAGNOSIS — J4530 Mild persistent asthma, uncomplicated: Secondary | ICD-10-CM

## 2018-07-04 DIAGNOSIS — F1010 Alcohol abuse, uncomplicated: Secondary | ICD-10-CM

## 2018-07-04 DIAGNOSIS — R198 Other specified symptoms and signs involving the digestive system and abdomen: Secondary | ICD-10-CM | POA: Insufficient documentation

## 2018-07-04 DIAGNOSIS — R0989 Other specified symptoms and signs involving the circulatory and respiratory systems: Secondary | ICD-10-CM | POA: Insufficient documentation

## 2018-07-04 MED ORDER — MOMETASONE FURO-FORMOTEROL FUM 100-5 MCG/ACT IN AERO: 2.0000 | INHALATION_SPRAY | Freq: Two times a day (BID) | RESPIRATORY_TRACT | 3 refills | Status: DC

## 2018-07-04 MED ORDER — ALBUTEROL SULFATE HFA 108 (90 BASE) MCG/ACT IN AERS: 2.0000 | INHALATION_SPRAY | Freq: Four times a day (QID) | RESPIRATORY_TRACT | 3 refills | Status: DC | PRN

## 2018-07-04 NOTE — Patient Instructions (Addendum)
I will see what other information I can gather for you.  What You Need To Know About Alcohol Abuse and Dependence, Adult Alcohol is a widely available drug. People who use alcohol will consume it in varying amounts. People who drink alcohol in excess, and have behavior problems during and after drinking alcohol, may have what is called an alcohol use disorder. Alcohol abuse and alcohol dependence are the two main types of alcohol use disorders:  Alcohol abuse is when you use alcohol too much or too often. You may use alcohol to make yourself feel happy or to reduce stress, but you may have a hard time setting a limit on the amount you drink.  Alcohol dependence is when you use alcohol excessively for a period of time, and your body and brain chemistry changes as a result. This can make it hard to stop drinking because you may start to feel sick or feel different when you do not use alcohol. How can alcohol abuse and dependence affect me? Alcohol abuse and dependence can have a negative effect on your life. Excessive use of alcohol may lead to an addiction. You may feel like you need alcohol to function normally. You may drink alcohol before work in the morning, during the day, or as soon as you get home from work in the evening. These actions can result in:  Poor performance at work.  Losing your job.  Financial problems.  Car crashes or criminal charges from driving after drinking alcohol.  Problems in your relationships with friends and family.  Losing the trust and respect of co-workers, friends, and family. Drinking heavily over a long period of time can permanently damage your body and brain, and can cause lifelong health issues, such as:  Liver disease.  Heart problems, high blood pressure, or stroke.  Damage to your pancreas.  Certain cancers.  Decreased ability to fight infections.  Numbness or tingling in hands or feet (neuropathy).  Brain damage.  Depression.  Early  (premature) death. When your body craves alcohol, it is easy to drink more than your body can handle. As a result, you may overdose. Alcohol overdose is a serious situation that requires hospitalization. It may lead to permanent injuries or death. What are the benefits of avoiding alcohol use? Limiting or avoiding alcohol can help you:  Avoid risks to your body, brain, and relationships.  Avoid the risk of abusing or becoming dependent on alcohol.  Keep your mind and body healthy. As a result, you may be more likely to accomplish your life goals.  Avoid permanent injury, organ damage, or death due to alcohol use. What steps can I take to stop drinking?   The best way to avoid alcohol abuse, dependence, and addiction is not to drink at all, or to drink measured amounts. Measured drinking means no more than 1 drink a day for nonpregnant women and 2 drinks a day for men. One drink equals 12 oz of beer, 5 oz of wine, or 1 oz of hard liquor.  Stop drinking if you have been drinking too much. This can be very hard to do if you are used to abusing alcohol. If you find it hard to stop drinking, talk about your experience with someone you trust. This person may be able to help you change your drinking behavior.  Instead of drinking alcohol, do something else, like a hobby or exercise.  Find healthy ways to cope with stress, such as exercise, meditation, or spending time with people  you care about.  In social gatherings and places where there may be alcohol, make intentional choices to drink non-alcohol beverages.  If your family, co-workers, or friends drink, talk to them about supporting you in your efforts to stop drinking. Ask them not to drink around you. Spend more time with people who do not drink alcohol.  If you think that you have an alcohol dependency problem: ? Tell friends or family about your concerns. ? Talk with your health care provider or another health professional about where  to get help. ? Work with a Transport planner and a Regulatory affairs officer. ? Consider joining a support group for people who struggle with alcohol abuse, dependence, and addiction. Where to find support You can get support for preventing alcohol abuse, dependence, and addiction from:  Your health care provider.  Alcoholics Anonymous (AA): NicTax.com.pt  SMART Recovery: www.smartrecovery.org  Local treatment centers or chemical dependency counselors. Where to find more information Learn more about alcohol abuse and dependence from:  Centers for Disease Control and Prevention: GoalForum.com.au  [employer] on Alcohol Abuse and Alcoholism: https://clark.org/  Local AA groups in your community. Contact a health care provider if:  You drink more or for longer than you intended, on more than one occasion.  You tried to stop drinking or to cut back on how much you drink, but you were not able to.  You often drink to the point of vomiting or passing out.  You want to drink so badly that you cannot think about anything else.  Drinking has created problems in your life, but you continue to drink.  You keep drinking even though you feel anxious, depressed, or have experienced memory loss.  You have stopped doing the things you used to enjoy in order to drink.  You have to drink more than you used to in order to get the effect you want.  You experience anxiety, sweating, nausea, shakiness, and trouble sleeping when you try to stop drinking.  You have thoughts about hurting yourself or others. If you ever feel like you may hurt yourself or others, or have thoughts about taking your own life, get help right away. You can go to your nearest emergency department or call:  Your local emergency services (911 in the [place]  A suicide crisis helpline, such as the [facility] at  765-152-2556. This is open 24 hours a day. Summary  Alcohol is a widely available drug. Misusing, abusing, and becoming dependent on alcohol can cause many problems.  It is important to measure and limit the amount of alcohol you consume. It is recommended to limit alcohol use to 1 drink a day for nonpregnant women and 2 drinks a day for men.  The risks associated with drinking too much will have a direct negative impact on your work, relationships, and health.  If you realize that you are having some challenges keeping your drinking under control, find some ways to change your behavior. Hobbies, self calming activities, exercise, or support groups can help.  If you feel you need help with changing your drinking habits, talk with your health care provider, a good friend, or a therapist, or go to an [facility] group. This information is not intended to replace advice given to you by your health care provider. Make sure you discuss any questions you have with your health care provider. Document Released: 02/17/2016 Document Revised: 02/17/2016 Document Reviewed: 02/17/2016 Elsevier Interactive Patient Education  2019 [employer].

## 2018-07-04 NOTE — Progress Notes (Signed)
Telephone visit  Subjective: CC: med refills asthma PCP: [name], DO IOE:[id number] Leon Mason is Leon 28 y.o. male calls for telephone consult today. Patient provides verbal consent for consult held via phone.  Location of patient: dealership Location of provider: [facility] Others present for call: privacy  1.  Allergies/asthma Patient reports that he is nearly out of his rescue inhaler and is asking for refills.  He states that he recently lost his insurance and having to pay out-of-pocket for the inhaler.  He is not use his Dulera in some time because of the cost.  He is wondering what he can use for insurance and if I have any suggestions as to how he might seek out low-cost insurance.  Denies any worsening of breathing.  2.  Globus sensation Patient reports about Leon month history of intermittent globus sensation, particularly notable when swallowing.  He denies choking on food.  He has Leon history of tonsilloliths but has not appreciated any enlargement of the tonsils; though he goes on to state that he is not quite looked back there either.  He does not report any neck swelling  3.  Alcohol abuse Patient reports that he is drinking several beers per night and acknowledges that he does have an alcohol problem.  He is certainly motivated at this time to discontinue drinking alcohol as he does realize the risk it poses.  He does report intermittent right upper quadrant discomfort and appreciable swelling and wonders if this is related to gallbladder versus liver.  He tries not to drink alcohol when he gets home but at the end of the night often finds the urge to proceed with alcohol intake.  He is considering AA.  He is under the care of psychiatry and is doing well on the prescribed medications.  He goes on to report that his psychiatrist of 6 years recently passed away and he is now seeing someone new.   ROS: Per HPI  Allergies  Allergen Reactions  . Other Hives and Shortness Of Breath   Dogs and Cats.  . Pollen Extract    Past Medical History:  Diagnosis Date  . Anxiety   . Asthma   . Bipolar 1 disorder ([place])   . Hallucinations   . Schizoaffective disorder ([place])   . Schizophrenia ([place])     Current Outpatient Medications:  .  albuterol (PROVENTIL HFA;VENTOLIN HFA) 108 (90 Base) MCG/ACT inhaler, Inhale 2 puffs into the lungs every 6 (six) hours as needed for wheezing or shortness of breath., Disp: 1 Inhaler, Rfl: 0 .  benztropine (COGENTIN) 1 MG tablet, Take 1 tablet (1 mg total) by mouth at bedtime as needed (for restless leg syndrome)., Disp: 30 tablet, Rfl: 2 .  cholecalciferol (VITAMIN D3) 25 MCG (1000 UT) tablet, Take 1,000 Units by mouth daily., Disp: , Rfl:  .  gabapentin (NEURONTIN) 100 MG capsule, Take 1 capsule (100 mg total) by mouth 3 (three) times daily. For agitation, Disp: 90 capsule, Rfl: 2 .  hydrOXYzine (ATARAX/VISTARIL) 25 MG tablet, Take 1 tablet (25 mg total) by mouth every 6 (six) hours as needed for anxiety. (Patient not taking: Reported on 06/28/2018), Disp: 60 tablet, Rfl: 0 .  lithium carbonate (ESKALITH) 450 MG CR tablet, Take 1 tablet (450 mg total) by mouth daily., Disp: 30 tablet, Rfl: 2 .  loratadine (CLARITIN) 10 MG tablet, Take 1 tablet (10 mg total) by mouth daily. (May buy from over the counter): For allergies, Disp: 30 tablet, Rfl: 11 .  mometasone-formoterol (DULERA) 100-5 MCG/ACT AERO, Inhale 2 puffs into the lungs 2 (two) times daily. For shortness of breath (Patient not taking: Reported on 06/28/2018), Disp: , Rfl:  .  Multiple Vitamin (MULTIVITAMIN) tablet, Take 1 tablet by mouth daily., Disp: , Rfl:  .  perphenazine (TRILAFON) 4 MG tablet, Take 1 tablet (4 mg total) by mouth 3 (three) times daily. For mood control, Disp: 90 tablet, Rfl: 2 .  sertraline (ZOLOFT) 100 MG tablet, Take 2 tablets (200 mg total) by mouth daily., Disp: 60 tablet, Rfl: 2 .  traZODone (DESYREL) 50 MG tablet, Take 1 tablet (50 mg total) by mouth at bedtime as  needed for sleep., Disp: 30 tablet, Rfl: 2  Assessment/ Plan: 28 y.o. male   1. Mild persistent allergic asthma I encouraged him to consider checking with DSS for Medicaid eligibility versus Leon government subsidized plan.  I also asked him to look for patient assistance programs for each of his medications.  Specifically [employer] offers Leon Tourist information centre manager for [employer] inhaler.  I also brought his attention to the good Rx website. - mometasone-formoterol (DULERA) 100-5 MCG/ACT AERO; Inhale 2 puffs into the lungs 2 (two) times daily. For shortness of breath  Dispense: 1 Inhaler; Refill: 3 - albuterol (VENTOLIN HFA) 108 (90 Base) MCG/ACT inhaler; Inhale 2 puffs into the lungs every 6 (six) hours as needed for wheezing or shortness of breath.  Dispense: 1 Inhaler; Refill: 3  2. Globus sensation Possibly related to gastric irritation related to increased alcohol intake.  Differential diagnosis considered includes tonsillar enlargement, thyroid mediated, anxiety mediated and stricture.  I favor GERD given excessive use of alcohol.  He is motivated to stop.   3. Alcohol abuse We discussed the risk for liver dysfunction, varices, etc. with persistent use.  I gave him Leon couple of telephone numbers for [place], Leon detox facility.  He had Leon bad experience with [facility] and does not wish to go there.  I will also ask the clinical social worker if they have any other recommendations   Start time: 2:13pm End time: 2:34pm  Total time spent on patient care (including telephone call/ virtual visit): 32 minutes  [place], [place] [phone]

## 2018-08-09 ENCOUNTER — Other Ambulatory Visit: Payer: Self-pay

## 2018-08-09 ENCOUNTER — Ambulatory Visit (INDEPENDENT_AMBULATORY_CARE_PROVIDER_SITE_OTHER): Payer: Medicaid Other | Admitting: Family Medicine

## 2018-08-09 DIAGNOSIS — F1020 Alcohol dependence, uncomplicated: Secondary | ICD-10-CM

## 2018-08-09 DIAGNOSIS — R61 Generalized hyperhidrosis: Secondary | ICD-10-CM

## 2018-08-09 NOTE — Progress Notes (Signed)
Telephone visit  Subjective: CC: sweating PCP: [name], DO CBJ:[id number] Leon Mason is Leon 28 y.o. male calls for telephone consult today. Patient provides verbal consent for consult held via phone.  Location of patient: work Location of provider: [facility] Others present for call: none  1. Sweating Patient reports lifelong issue with sweating he notes that only after Leon little bit of activity he sweats profusely.  He notes that his father had Leon very similar condition.  He is wondering if there is anything that he can do to reduce the amount of sweating, as he works in [employer] and worries about dripping.  2.  Alcohol use disorder Patient reports he has ongoing issues with alcohol use but he has reduced it quite Leon bit and "does not get drunk every night".  He is down to about 10 x 12 ounce cans of beer per night.  He does report that instead he is smoking marijuana which seems to be reducing his need for alcohol.   ROS: Per HPI  Allergies  Allergen Reactions  . Other Hives and Shortness Of Breath    Dogs and Cats.  . Pollen Extract    Past Medical History:  Diagnosis Date  . Anxiety   . Asthma   . Bipolar 1 disorder ([place])   . Hallucinations   . Schizoaffective disorder ([place])   . Schizophrenia ([place])     Current Outpatient Medications:  .  albuterol (VENTOLIN HFA) 108 (90 Base) MCG/ACT inhaler, Inhale 2 puffs into the lungs every 6 (six) hours as needed for wheezing or shortness of breath., Disp: 1 Inhaler, Rfl: 3 .  benztropine (COGENTIN) 1 MG tablet, Take 1 tablet (1 mg total) by mouth at bedtime as needed (for restless leg syndrome)., Disp: 30 tablet, Rfl: 2 .  cholecalciferol (VITAMIN D3) 25 MCG (1000 UT) tablet, Take 1,000 Units by mouth daily., Disp: , Rfl:  .  gabapentin (NEURONTIN) 100 MG capsule, Take 1 capsule (100 mg total) by mouth 3 (three) times daily. For agitation, Disp: 90 capsule, Rfl: 2 .  hydrOXYzine (ATARAX/VISTARIL) 25 MG tablet, Take 1 tablet (25  mg total) by mouth every 6 (six) hours as needed for anxiety. (Patient not taking: Reported on 06/28/2018), Disp: 60 tablet, Rfl: 0 .  lithium carbonate (ESKALITH) 450 MG CR tablet, Take 1 tablet (450 mg total) by mouth daily., Disp: 30 tablet, Rfl: 2 .  loratadine (CLARITIN) 10 MG tablet, Take 1 tablet (10 mg total) by mouth daily. (May buy from over the counter): For allergies, Disp: 30 tablet, Rfl: 11 .  mometasone-formoterol (DULERA) 100-5 MCG/ACT AERO, Inhale 2 puffs into the lungs 2 (two) times daily. For shortness of breath, Disp: 1 Inhaler, Rfl: 3 .  Multiple Vitamin (MULTIVITAMIN) tablet, Take 1 tablet by mouth daily., Disp: , Rfl:  .  perphenazine (TRILAFON) 4 MG tablet, Take 1 tablet (4 mg total) by mouth 3 (three) times daily. For mood control, Disp: 90 tablet, Rfl: 2 .  sertraline (ZOLOFT) 100 MG tablet, Take 2 tablets (200 mg total) by mouth daily., Disp: 60 tablet, Rfl: 2 .  traZODone (DESYREL) 50 MG tablet, Take 1 tablet (50 mg total) by mouth at bedtime as needed for sleep., Disp: 30 tablet, Rfl: 2  Assessment/ Plan: 28 y.o. male   1. Excessive sweating Possibly related to excessive alcohol use.  I hesitate to add any medications for hyperhidrosis given multiple medications used for mental health.  He is on gabapentin and I wonder if increasing  the dose to 300 mg daily would help better control these vasomotor symptoms.  I advised him to gradually go ahead and increase from 100 mg daily to 300 mg daily (he is not actually taking 100 mg 3 times daily because he is often too busy during the daytime to remember to take this every 6-8 hours.)  I will also reach out to his psychiatrist to make sure that this will not interfere with her current therapies.  2. Alcohol use disorder, severe, dependence ([place]) I congratulated him on his reduction and encouraged him to continue gradually weaning off of alcohol.  Hopefully the increase in gabapentin as above will also help in reaching these  goals.   Start time: 10:26am End time: 10:42am  Total time spent on patient care (including telephone call/ virtual visit): 23 minutes  [place], [place] [phone]

## 2018-08-11 ENCOUNTER — Encounter: Payer: Self-pay | Admitting: Family Medicine

## 2018-09-06 ENCOUNTER — Ambulatory Visit: Payer: Self-pay | Admitting: Physician Assistant

## 2018-09-22 ENCOUNTER — Ambulatory Visit (INDEPENDENT_AMBULATORY_CARE_PROVIDER_SITE_OTHER): Payer: Medicaid Other | Admitting: Psychiatry

## 2018-09-22 ENCOUNTER — Other Ambulatory Visit: Payer: Self-pay

## 2018-09-22 DIAGNOSIS — F203 Undifferentiated schizophrenia: Secondary | ICD-10-CM

## 2018-09-22 DIAGNOSIS — F411 Generalized anxiety disorder: Secondary | ICD-10-CM

## 2018-09-22 DIAGNOSIS — Z636 Dependent relative needing care at home: Secondary | ICD-10-CM

## 2018-09-22 DIAGNOSIS — Z8659 Personal history of other mental and behavioral disorders: Secondary | ICD-10-CM
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
## 2018-09-30 ENCOUNTER — Emergency Department (HOSPITAL_COMMUNITY)
Admission: EM | Admit: 2018-09-30 | Discharge: 2018-09-30 | Disposition: A | Discharge status: Home / Self Care | Payer: Medicaid Other | Attending: Emergency Medicine | Admitting: Emergency Medicine

## 2018-09-30 ENCOUNTER — Encounter (HOSPITAL_COMMUNITY): Payer: Self-pay | Admitting: Emergency Medicine

## 2018-09-30 ENCOUNTER — Other Ambulatory Visit: Payer: Self-pay

## 2018-09-30 ENCOUNTER — Emergency Department (HOSPITAL_COMMUNITY): Payer: Medicaid Other

## 2018-09-30 DIAGNOSIS — Z87891 Personal history of nicotine dependence: Secondary | ICD-10-CM | POA: Insufficient documentation

## 2018-09-30 DIAGNOSIS — J4541 Moderate persistent asthma with (acute) exacerbation: Secondary | ICD-10-CM | POA: Insufficient documentation

## 2018-09-30 DIAGNOSIS — Z20828 Contact with and (suspected) exposure to other viral communicable diseases: Secondary | ICD-10-CM | POA: Insufficient documentation

## 2018-09-30 MED ORDER — PREDNISONE 20 MG PO TABS: 40.0000 mg | ORAL_TABLET | Freq: Every day | ORAL | 0 refills | Status: DC

## 2018-09-30 MED ORDER — ALBUTEROL SULFATE HFA 108 (90 BASE) MCG/ACT IN AERS
2.0000 | INHALATION_SPRAY | RESPIRATORY_TRACT | Status: AC
  Administered 2018-09-30: 20:00:00 2 via RESPIRATORY_TRACT
  Filled 2018-09-30: qty 6.7

## 2018-09-30 MED ORDER — PREDNISONE 20 MG PO TABS
40.0000 mg | ORAL_TABLET | Freq: Once | ORAL | Status: AC
  Administered 2018-09-30: 20:00:00 40 mg via ORAL
  Filled 2018-09-30: qty 2

## 2018-09-30 NOTE — Discharge Instructions (Signed)
Albuterol inhaler, 2 puffs every 4 hours as needed, please use your spacer. Prednisone, 40 mg daily for the next 5 days Your x-ray was normal Your coronavirus test should result within the next several days you will be contacted about results if positive  Please return to the emergency department for severe or worsening symptoms.

## 2018-09-30 NOTE — ED Triage Notes (Signed)
Patient has a history of asthma and does not take any medications at home for asthma because he can not afford them as he stated. Patient complains of shortness of breath with wheezing and chest tightness with some chest pain that became worse today.

## 2018-09-30 NOTE — ED Provider Notes (Signed)
[facility] EMERGENCY DEPARTMENT Provider Note   CSN: [id number] Arrival date & time: 09/30/18  1856    History   Chief Complaint Chief Complaint  Patient presents with  . Shortness of Breath    HPI Leon Mason is a 28 y.o. male.     HPI  The patient is a 28 year old male, he has a known history of bipolar disorder, schizophrenia, he also has a history of asthma and over the last couple of weeks has had some progressive shortness of breath associated with wheezing and tightness in his chest and a cough productive of occasional phlegm.  He denies having any sick exposures, he states that he quit his job today, he states that he does smoke marijuana on a daily basis.  He has run out of his albuterol inhaler 1 week ago and has had some progressive shortness of breath since.  He does use a spacer which he states is in his car.  He lives with his mother, states there is lots of mold in the house.  Denies any swelling of the legs.  Past Medical History:  Diagnosis Date  . Anxiety   . Asthma   . Bipolar 1 disorder ([place])   . Hallucinations   . Schizoaffective disorder ([place])   . Schizophrenia [facility])     Patient Active Problem List   Diagnosis Date Noted  . Globus sensation 07/04/2018  . Schizophrenia ([place]) 03/28/2018  . MDD (major depressive disorder), recurrent episode, severe ([place]) 03/28/2018  . Schizo-affective schizophrenia, chronic condition with acute exacerbation ([place]) 03/28/2018  . Tobacco use disorder 12/17/2016  . Schizoaffective disorder, bipolar type ([place]) 08/23/2016  . Alcohol use disorder, severe, dependence ([place]) 08/23/2016  . Allergic rhinitis due to pollen 10/09/2015  . 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 Mild persistent allergic asthma 10/09/2015  . Psychosis ([place]) 06/14/2013    Past Surgical History:  Procedure Laterality Date  . [place] EXTRACTION  2009   all four        Home Medications    Prior to Admission medications   Medication Sig  Start Date End Date Taking? Authorizing Provider  albuterol (VENTOLIN HFA) 108 (90 Base) MCG/ACT inhaler Inhale 2 puffs into the lungs every 6 (six) hours as needed for wheezing or shortness of breath. 07/04/18  Yes Gottschalk, Leatrice Jewels M, DO  benztropine (COGENTIN) 1 MG tablet Take 1 tablet (1 mg total) by mouth at bedtime as needed (for restless leg syndrome). 06/28/18  Yes Donnal Moat T, PA-C  cholecalciferol (VITAMIN D3) 25 MCG (1000 UT) tablet Take 1,000 Units by mouth daily.   Yes [provider]  gabapentin (NEURONTIN) 100 MG capsule Take 1 capsule (100 mg total) by mouth 3 (three) times daily. For agitation 06/28/18  Yes Donnal Moat T, PA-C  lithium carbonate (ESKALITH) 450 MG CR tablet Take 1 tablet (450 mg total) by mouth daily. 06/28/18  Yes Hurst, Dorothea Glassman, PA-C  mometasone-formoterol (DULERA) 100-5 MCG/ACT AERO Inhale 2 puffs into the lungs 2 (two) times daily. For shortness of breath 07/04/18  Yes Gottschalk, Ashly M, DO  sertraline (ZOLOFT) 100 MG tablet Take 2 tablets (200 mg total) by mouth daily. 06/28/18  Yes Hurst, Dorothea Glassman, PA-C  traZODone (DESYREL) 50 MG tablet Take 1 tablet (50 mg total) by mouth at bedtime as needed for sleep. 06/28/18  Yes Donnal Moat T, PA-C  hydrOXYzine (ATARAX/VISTARIL) 25 MG tablet Take 1 tablet (25 mg total) by mouth every 6 (six) hours as needed for anxiety. Patient not  taking: Reported on 06/28/2018 03/31/18   Lindell Spar I, NP  loratadine (CLARITIN) 10 MG tablet Take 1 tablet (10 mg total) by mouth daily. (May buy from over the counter): For allergies 03/31/18   Lindell Spar I, NP  Multiple Vitamin (MULTIVITAMIN) tablet Take 1 tablet by mouth daily.    [provider]  perphenazine (TRILAFON) 4 MG tablet Take 1 tablet (4 mg total) by mouth 3 (three) times daily. For mood control 06/28/18   Donnal Moat T, PA-C  predniSONE (DELTASONE) 20 MG tablet Take 2 tablets (40 mg total) by mouth daily. 09/30/18   Noemi Chapel, MD    Family History  Family History  Problem Relation Age of Onset  . Eczema Brother   . Post-traumatic stress disorder Brother   . COPD Maternal Aunt   . Bipolar disorder Mother   . Alcoholism Mother   . Diabetes Mother   . OCD Father   . Bipolar disorder Father   . Heart attack Maternal Grandmother   . Breast cancer Maternal Grandmother   . Cancer Paternal Grandmother   . Heart attack Paternal Grandfather   . Asthma Neg Hx   . Allergic rhinitis Neg Hx   . Angioedema Neg Hx   . Urticaria Neg Hx   . Immunodeficiency Neg Hx   . Atopy Neg Hx   . Prostate cancer Neg Hx   . Colon cancer Neg Hx     Social History Social History   Tobacco Use  . Smoking status: Former Smoker    Packs/day: 0.00    Years: 0.50    Pack years: 0.00    Types: Cigarettes    Quit date: 05/28/2018    Years since quitting: 0.3  . Smokeless tobacco: Former Network engineer Use Topics  . Alcohol use: Yes    Alcohol/week: 56.0 standard drinks    Types: 56 Cans of beer per week  . Drug use: No     Allergies   Other and Pollen extract   Review of Systems Review of Systems  All other systems reviewed and are negative.    Physical Exam Updated Vital Signs BP (!) 130/93   Pulse 96   Temp 98.1 F (36.7 C) (Oral)   Resp 14   Ht 1.905 m (6\' 3" )   Wt 129.3 kg   SpO2 99%   BMI 35.62 kg/m   Physical Exam Vitals signs and nursing note reviewed.  Constitutional:      General: He is not in acute distress.    Appearance: He is well-developed.  HENT:     Head: Normocephalic and atraumatic.     Mouth/Throat:     Pharynx: No oropharyngeal exudate.  Eyes:     General: No scleral icterus.       Right eye: No discharge.        Left eye: No discharge.     Conjunctiva/sclera: Conjunctivae normal.     Pupils: Pupils are equal, round, and reactive to light.  Neck:     Musculoskeletal: Normal range of motion and neck supple.     Thyroid: No thyromegaly.     Vascular: No JVD.  Cardiovascular:     Rate and Rhythm:  Normal rate and regular rhythm.     Heart sounds: Normal heart sounds. No murmur. No friction rub. No gallop.   Pulmonary:     Effort: Pulmonary effort is normal. No respiratory distress.     Breath sounds: Examination of the right-upper field reveals wheezing. Examination  of the left-upper field reveals wheezing. Examination of the right-middle field reveals wheezing. Examination of the left-middle field reveals wheezing. Examination of the right-lower field reveals wheezing. Examination of the left-lower field reveals wheezing. Wheezing present. No rales.  Abdominal:     General: Bowel sounds are normal. There is no distension.     Palpations: Abdomen is soft. There is no mass.     Tenderness: There is no abdominal tenderness.  Musculoskeletal: Normal range of motion.        General: No tenderness.  Lymphadenopathy:     Cervical: No cervical adenopathy.  Skin:    General: Skin is warm and dry.     Findings: No erythema or rash.  Neurological:     Mental Status: He is alert.     Coordination: Coordination normal.  Psychiatric:        Behavior: Behavior normal.      ED Treatments / Results  Labs (all labs ordered are listed, but only abnormal results are displayed) Labs Reviewed  NOVEL CORONAVIRUS, NAA (HOSPITAL ORDER, SEND-OUT TO REF LAB)    EKG None  Radiology Dg Chest Port 1 View  Result Date: 09/30/2018 CLINICAL DATA:  Cough and shortness of breath. EXAM: PORTABLE CHEST 1 VIEW COMPARISON:  April 26, 2018 FINDINGS: The heart size and mediastinal contours are within normal limits. Both lungs are clear. The visualized skeletal structures are unremarkable. IMPRESSION: No active disease. Electronically Signed   By: Constance Holster M.D.   On: 09/30/2018 19:58    Procedures Procedures (including critical care time)  Medications Ordered in ED Medications  albuterol (VENTOLIN HFA) 108 (90 Base) MCG/ACT inhaler 2 puff (2 puffs Inhalation Given 09/30/18 1956)  predniSONE  (DELTASONE) tablet 40 mg (40 mg Oral Given 09/30/18 1956)     Initial Impression / Assessment and Plan / ED Course  I have reviewed the triage vital signs and the nursing notes.  Pertinent labs & imaging results that were available during my care of the patient were reviewed by me and considered in my medical decision making (see chart for details).  Clinical Course as of Sep 30 2002  Sat Sep 30, 2018  2002 Chest x-ray without any acute findings, patient stable for discharge   [BM]    Clinical Course User Index [BM] Noemi Chapel, MD      The patient's exam is fairly unremarkable other than the diffuse wheezing.  He would benefit from an albuterol treatment here and we will dispense the metered-dose inhaler for home.  He Artie has a spacer.  His oxygen is 99 to 100%, he is afebrile, he is not tachycardic on my exam.  I will obtain a chest x-ray to rule out pneumonia and a COVID swab as an outpatient.  The patient is agreeable to the plan, he appears stable for discharge.  Prednisone given prior to discharge.  Final Clinical Impressions(s) / ED Diagnoses   Final diagnoses:  Moderate persistent asthma with exacerbation    ED Discharge Orders         Ordered    predniSONE (DELTASONE) 20 MG tablet  Daily     09/30/18 Stefani Dama, MD 09/30/18 2004

## 2018-10-03 LAB — NOVEL CORONAVIRUS, NAA (HOSP ORDER, SEND-OUT TO REF LAB; TAT 18-24 HRS): SARS-CoV-2, NAA: NOT DETECTED

## 2018-10-10 ENCOUNTER — Other Ambulatory Visit: Payer: Self-pay

## 2018-10-10 MED ORDER — SERTRALINE HCL 100 MG PO TABS: 200.0000 mg | ORAL_TABLET | Freq: Every day | ORAL | 2 refills | Status: DC

## 2018-10-27 ENCOUNTER — Emergency Department (HOSPITAL_COMMUNITY)
Admission: EM | Admit: 2018-10-27 | Discharge: 2018-10-27 | Disposition: A | Discharge status: Home / Self Care | Payer: Medicaid Other | Attending: Emergency Medicine | Admitting: Emergency Medicine

## 2018-10-27 ENCOUNTER — Encounter (HOSPITAL_COMMUNITY): Payer: Self-pay

## 2018-10-27 ENCOUNTER — Other Ambulatory Visit: Payer: Self-pay

## 2018-10-27 DIAGNOSIS — R05 Cough: Secondary | ICD-10-CM | POA: Insufficient documentation

## 2018-10-27 DIAGNOSIS — J4521 Mild intermittent asthma with (acute) exacerbation: Secondary | ICD-10-CM

## 2018-10-27 DIAGNOSIS — Z79899 Other long term (current) drug therapy: Secondary | ICD-10-CM | POA: Insufficient documentation

## 2018-10-27 DIAGNOSIS — R0789 Other chest pain: Secondary | ICD-10-CM | POA: Insufficient documentation

## 2018-10-27 DIAGNOSIS — Z87891 Personal history of nicotine dependence: Secondary | ICD-10-CM | POA: Insufficient documentation

## 2018-10-27 MED ORDER — ALBUTEROL SULFATE HFA 108 (90 BASE) MCG/ACT IN AERS: 2.0000 | INHALATION_SPRAY | RESPIRATORY_TRACT | 0 refills | Status: DC | PRN

## 2018-10-27 MED ORDER — PREDNISONE 20 MG PO TABS: ORAL_TABLET | ORAL | 0 refills | Status: DC

## 2018-10-27 MED ORDER — ALBUTEROL SULFATE HFA 108 (90 BASE) MCG/ACT IN AERS
8.0000 | INHALATION_SPRAY | Freq: Once | RESPIRATORY_TRACT | Status: AC
  Administered 2018-10-27: 8 via RESPIRATORY_TRACT
  Filled 2018-10-27: qty 6.7

## 2018-10-27 MED ORDER — AZITHROMYCIN 250 MG PO TABS: ORAL_TABLET | ORAL | 0 refills | Status: DC

## 2018-10-27 MED ORDER — PREDNISONE 50 MG PO TABS
60.0000 mg | ORAL_TABLET | Freq: Once | ORAL | Status: AC
  Administered 2018-10-27: 60 mg via ORAL
  Filled 2018-10-27: qty 1

## 2018-10-27 MED ORDER — ALBUTEROL SULFATE HFA 108 (90 BASE) MCG/ACT IN AERS
4.0000 | INHALATION_SPRAY | Freq: Once | RESPIRATORY_TRACT | Status: AC
  Administered 2018-10-27: 4 via RESPIRATORY_TRACT

## 2018-10-27 NOTE — Discharge Instructions (Addendum)
Use your inhaler for wheezing or shortness of breath.  Take the prednisone and the Z-Pak until gone.  Recheck if you get a fever or struggle to breathe again.

## 2018-10-27 NOTE — ED Triage Notes (Signed)
Pt reports sob, wheezing, chest tightness onset x 1 hour, ran out of his inhaler 3-4 days ago.

## 2018-10-27 NOTE — ED Provider Notes (Signed)
[facility] EMERGENCY DEPARTMENT Provider Note   CSN: [id number] Arrival date & time: 10/27/18  0159   Time seen 3:20 AM  History   Chief Complaint Chief Complaint  Patient presents with  . Shortness of Breath    asthma    HPI Leon Mason is a 28 y.o. male.     HPI patient has a history of asthma.  He states he had a flareup start 2 to 3 weeks ago when he was seen in the ED on July 25.  He states he just has not gotten better.  He has run out of his inhaler.  He was given steroids which he states did not help.  He states he is having a cough of clear mucus, but he denies rhinorrhea, sneezing, sore throat, or fever.  He does complain of his chest being tight and having wheezing.  When I asked him why he did not call his primary care doctor to get a refill of his inhaler he states "I did not think about that".  He states he is never had to be admitted to the hospital for his asthma flareups.  He is never seen a pulmonologist.  He states normally he is put on steroids with his flareups.  PCP [name], DO   Past Medical History:  Diagnosis Date  . Anxiety   . Asthma   . Bipolar 1 disorder ([place])   . Hallucinations   . Schizoaffective disorder ([place])   . Schizophrenia [facility])     Patient Active Problem List   Diagnosis Date Noted  . Globus sensation 07/04/2018  . Schizophrenia ([place]) 03/28/2018  . MDD (major depressive disorder), recurrent episode, severe ([place]) 03/28/2018  . Schizo-affective schizophrenia, chronic condition with acute exacerbation ([place]) 03/28/2018  . Tobacco use disorder 12/17/2016  . Schizoaffective disorder, bipolar type ([place]) 08/23/2016  . Alcohol use disorder, severe, dependence ([place]) 08/23/2016  . Allergic rhinitis due to pollen 10/09/2015  . 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 Mild persistent allergic asthma 10/09/2015  . Psychosis ([place]) 06/14/2013    Past Surgical History:  Procedure Laterality Date  . [place] EXTRACTION  2009   all four        Home Medications    Prior to Admission medications   Medication Sig Start Date End Date Taking? Authorizing Provider  albuterol (VENTOLIN HFA) 108 (90 Base) MCG/ACT inhaler Inhale 2 puffs into the lungs every 4 (four) hours as needed. 10/27/18   Rolland Porter, MD  azithromycin (ZITHROMAX Z-PAK) 250 MG tablet Take 2 po the first day then once a day for the next 4 days. 10/27/18   Rolland Porter, MD  benztropine (COGENTIN) 1 MG tablet Take 1 tablet (1 mg total) by mouth at bedtime as needed (for restless leg syndrome). 06/28/18   Addison Lank, PA-C  cholecalciferol (VITAMIN D3) 25 MCG (1000 UT) tablet Take 1,000 Units by mouth daily.    [provider]  gabapentin (NEURONTIN) 100 MG capsule Take 1 capsule (100 mg total) by mouth 3 (three) times daily. For agitation 06/28/18   Donnal Moat T, PA-C  hydrOXYzine (ATARAX/VISTARIL) 25 MG tablet Take 1 tablet (25 mg total) by mouth every 6 (six) hours as needed for anxiety. Patient not taking: Reported on 06/28/2018 03/31/18   Lindell Spar I, NP  lithium carbonate (ESKALITH) 450 MG CR tablet Take 1 tablet (450 mg total) by mouth daily. 06/28/18   Donnal Moat T, PA-C  loratadine (CLARITIN) 10 MG tablet Take 1 tablet (10  mg total) by mouth daily. (May buy from over the counter): For allergies 03/31/18   Lindell Spar I, NP  mometasone-formoterol (DULERA) 100-5 MCG/ACT AERO Inhale 2 puffs into the lungs 2 (two) times daily. For shortness of breath 07/04/18   Ronnie Doss M, DO  Multiple Vitamin (MULTIVITAMIN) tablet Take 1 tablet by mouth daily.    [provider]  perphenazine (TRILAFON) 4 MG tablet Take 1 tablet (4 mg total) by mouth 3 (three) times daily. For mood control 06/28/18   Donnal Moat T, PA-C  predniSONE (DELTASONE) 20 MG tablet Take 3 po QD x 3d , then 2 po QD x 3d then 1 po QD x 3d 10/27/18   Rolland Porter, MD  sertraline (ZOLOFT) 100 MG tablet Take 2 tablets (200 mg total) by mouth daily. 10/10/18   Addison Lank,  PA-C  traZODone (DESYREL) 50 MG tablet Take 1 tablet (50 mg total) by mouth at bedtime as needed for sleep. 06/28/18   Addison Lank, PA-C    Family History Family History  Problem Relation Age of Onset  . Eczema Brother   . Post-traumatic stress disorder Brother   . COPD Maternal Aunt   . Bipolar disorder Mother   . Alcoholism Mother   . Diabetes Mother   . OCD Father   . Bipolar disorder Father   . Heart attack Maternal Grandmother   . Breast cancer Maternal Grandmother   . Cancer Paternal Grandmother   . Heart attack Paternal Grandfather   . Asthma Neg Hx   . Allergic rhinitis Neg Hx   . Angioedema Neg Hx   . Urticaria Neg Hx   . Immunodeficiency Neg Hx   . Atopy Neg Hx   . Prostate cancer Neg Hx   . Colon cancer Neg Hx     Social History Social History   Tobacco Use  . Smoking status: Former Smoker    Packs/day: 0.00    Years: 0.50    Pack years: 0.00    Types: Cigarettes    Quit date: 05/28/2018    Years since quitting: 0.4  . Smokeless tobacco: Former Network engineer Use Topics  . Alcohol use: Yes    Alcohol/week: 56.0 standard drinks    Types: 56 Cans of beer per week  . Drug use: No  Lives with his mother Unemployed and not on disability   Allergies   Other and Pollen extract   Review of Systems Review of Systems  All other systems reviewed and are negative.    Physical Exam Updated Vital Signs BP 130/67   Pulse 78   Temp 98 F (36.7 C) (Oral)   Resp 17   Ht 6\' 2"  (1.88 m)   Wt 127 kg   SpO2 97%   BMI 35.95 kg/m   Physical Exam Vitals signs and nursing note reviewed.  Constitutional:      General: He is not in acute distress.    Appearance: Normal appearance. He is well-developed. He is not ill-appearing or toxic-appearing.  HENT:     Head: Normocephalic and atraumatic.     Right Ear: External ear normal.     Left Ear: External ear normal.     Nose: Nose normal. No mucosal edema or rhinorrhea.     Mouth/Throat:     Dentition:  No dental abscesses.     Pharynx: No uvula swelling.  Eyes:     Conjunctiva/sclera: Conjunctivae normal.     Pupils: Pupils are equal, round, and reactive  to light.  Neck:     Musculoskeletal: Full passive range of motion without pain, normal range of motion and neck supple.  Cardiovascular:     Rate and Rhythm: Normal rate and regular rhythm.     Heart sounds: Normal heart sounds. No murmur. No friction rub. No gallop.   Pulmonary:     Effort: Pulmonary effort is normal. No tachypnea, accessory muscle usage or respiratory distress.     Breath sounds: Wheezing present. No rhonchi or rales.  Chest:     Chest wall: No tenderness or crepitus.  Abdominal:     General: There is no distension.  Musculoskeletal: Normal range of motion.        General: No tenderness.     Comments: Moves all extremities well.   Skin:    General: Skin is warm and dry.     Coloration: Skin is not pale.     Findings: No erythema or rash.  Neurological:     General: No focal deficit present.     Mental Status: He is alert and oriented to person, place, and time.     Cranial Nerves: No cranial nerve deficit.  Psychiatric:        Mood and Affect: Mood normal. Mood is not anxious.        Speech: Speech normal.        Behavior: Behavior normal.      ED Treatments / Results  Labs (all labs ordered are listed, but only abnormal results are displayed) Labs Reviewed - No data to display  EKG None  Radiology No results found.  Procedures Procedures (including critical care time)  Medications Ordered in ED Medications  albuterol (VENTOLIN HFA) 108 (90 Base) MCG/ACT inhaler 8 puff (8 puffs Inhalation Given 10/27/18 0336)  predniSONE (DELTASONE) tablet 60 mg (60 mg Oral Given 10/27/18 0336)  albuterol (VENTOLIN HFA) 108 (90 Base) MCG/ACT inhaler 4 puff (4 puffs Inhalation Given 10/27/18 0451)     Initial Impression / Assessment and Plan / ED Course  I have reviewed the triage vital signs and the  nursing notes.  Pertinent labs & imaging results that were available during my care of the patient were reviewed by me and considered in my medical decision making (see chart for details).    Patient was given albuterol 8 puffs by inhaler and oral prednisone.  Recheck at 4:35 AM patient states he is feeling better.  When I listen to him now instead of having diffuse scattered high-pitched wheezing in all fields he has a few rare rhonchi.  I will have respiratory therapy give him another 4 puffs up with the inhaler and he can be discharged.    Final Clinical Impressions(s) / ED Diagnoses   Final diagnoses:  Mild intermittent asthma with exacerbation    ED Discharge Orders         Ordered    predniSONE (DELTASONE) 20 MG tablet     10/27/18 0442    azithromycin (ZITHROMAX Z-PAK) 250 MG tablet     10/27/18 0442    albuterol (VENTOLIN HFA) 108 (90 Base) MCG/ACT inhaler  Every 4 hours PRN     10/27/18 0443         Plan discharge  Rolland Porter, MD, Barbette Or, MD 10/27/18 [phone]

## 2019-01-18 ENCOUNTER — Encounter (HOSPITAL_COMMUNITY): Payer: Self-pay | Admitting: Emergency Medicine

## 2019-01-18 ENCOUNTER — Emergency Department (HOSPITAL_COMMUNITY): Payer: Self-pay

## 2019-01-18 ENCOUNTER — Emergency Department (HOSPITAL_COMMUNITY)
Admission: EM | Admit: 2019-01-18 | Discharge: 2019-01-18 | Disposition: A | Discharge status: Home / Self Care | Payer: Self-pay | Attending: Emergency Medicine | Admitting: Emergency Medicine

## 2019-01-18 ENCOUNTER — Encounter: Payer: Self-pay | Admitting: Family Medicine

## 2019-01-18 ENCOUNTER — Other Ambulatory Visit: Payer: Self-pay

## 2019-01-18 DIAGNOSIS — J45901 Unspecified asthma with (acute) exacerbation: Secondary | ICD-10-CM

## 2019-01-18 DIAGNOSIS — Z87891 Personal history of nicotine dependence: Secondary | ICD-10-CM | POA: Insufficient documentation

## 2019-01-18 DIAGNOSIS — Z20828 Contact with and (suspected) exposure to other viral communicable diseases: Secondary | ICD-10-CM | POA: Insufficient documentation

## 2019-01-18 DIAGNOSIS — Z20822 Contact with and (suspected) exposure to covid-19: Secondary | ICD-10-CM

## 2019-01-18 DIAGNOSIS — Z79899 Other long term (current) drug therapy: Secondary | ICD-10-CM | POA: Insufficient documentation

## 2019-01-18 DIAGNOSIS — J4541 Moderate persistent asthma with (acute) exacerbation: Secondary | ICD-10-CM | POA: Insufficient documentation

## 2019-01-18 LAB — CBC WITH DIFFERENTIAL/PLATELET
Abs Immature Granulocytes: 0.03 10*3/uL (ref 0.00–0.07)
Basophils Absolute: 0.1 10*3/uL (ref 0.0–0.1)
Basophils Relative: 1 %
Eosinophils Absolute: 0.5 10*3/uL (ref 0.0–0.5)
Eosinophils Relative: 6 %
HCT: 43.5 % (ref 39.0–52.0)
Hemoglobin: 14.8 g/dL (ref 13.0–17.0)
Immature Granulocytes: 0 %
Lymphocytes Relative: 19 %
Lymphs Abs: 1.6 10*3/uL (ref 0.7–4.0)
MCH: 31.4 pg (ref 26.0–34.0)
MCHC: 34 g/dL (ref 30.0–36.0)
MCV: 92.2 fL (ref 80.0–100.0)
Monocytes Absolute: 0.5 10*3/uL (ref 0.1–1.0)
Monocytes Relative: 7 %
Neutro Abs: 5.4 10*3/uL (ref 1.7–7.7)
Neutrophils Relative %: 67 %
Platelets: 174 10*3/uL (ref 150–400)
RBC: 4.72 MIL/uL (ref 4.22–5.81)
RDW: 13.1 % (ref 11.5–15.5)
WBC: 8.1 10*3/uL (ref 4.0–10.5)
nRBC: 0 % (ref 0.0–0.2)

## 2019-01-18 LAB — BASIC METABOLIC PANEL
Anion gap: 11 (ref 5–15)
BUN: 19 mg/dL (ref 6–20)
CO2: 23 mmol/L (ref 22–32)
Calcium: 9.5 mg/dL (ref 8.9–10.3)
Chloride: 103 mmol/L (ref 98–111)
Creatinine, Ser: 0.89 mg/dL (ref 0.61–1.24)
GFR calc Af Amer: >60 mL/min (ref >60)
GFR calc non Af Amer: >60 mL/min (ref >60)
Glucose, Bld: 107 mg/dL — ABNORMAL HIGH (ref 70–99)
Potassium: 3.8 mmol/L (ref 3.5–5.1)
Sodium: 137 mmol/L (ref 135–145)

## 2019-01-18 LAB — SARS CORONAVIRUS 2 (TAT 6-24 HRS): SARS Coronavirus 2: NEGATIVE

## 2019-01-18 MED ORDER — METHYLPREDNISOLONE SODIUM SUCC 125 MG IJ SOLR
125.0000 mg | Freq: Once | INTRAMUSCULAR | Status: AC
  Administered 2019-01-18: 125 mg via INTRAVENOUS
  Filled 2019-01-18: qty 2

## 2019-01-18 MED ORDER — ALBUTEROL SULFATE HFA 108 (90 BASE) MCG/ACT IN AERS
2.0000 | INHALATION_SPRAY | Freq: Four times a day (QID) | RESPIRATORY_TRACT | Status: DC
  Administered 2019-01-18: 2 via RESPIRATORY_TRACT
  Filled 2019-01-18: qty 6.7

## 2019-01-18 MED ORDER — SODIUM CHLORIDE 0.9 % IV SOLN
INTRAVENOUS | Status: DC
  Administered 2019-01-18: 18:00:00 via INTRAVENOUS

## 2019-01-18 MED ORDER — SODIUM CHLORIDE 0.9 % IV BOLUS
1000.0000 mL | Freq: Once | INTRAVENOUS | Status: AC
  Administered 2019-01-18: 1000 mL via INTRAVENOUS

## 2019-01-18 MED ORDER — ONDANSETRON HCL 4 MG/2ML IJ SOLN
4.0000 mg | Freq: Once | INTRAMUSCULAR | Status: AC
  Administered 2019-01-18: 4 mg via INTRAVENOUS
  Filled 2019-01-18: qty 2

## 2019-01-18 MED ORDER — PREDNISONE 10 MG PO TABS: 40.0000 mg | ORAL_TABLET | Freq: Every day | ORAL | 0 refills | Status: DC

## 2019-01-18 NOTE — Discharge Instructions (Signed)
Chest x-ray without evidence of pneumonia.  But symptom complex could be consistent with COVID-19 infection and or exacerbation of the asthma.  Use albuterol inhaler 2 puffs every 6 hours at least for now of 7 days.  Take the prednisone as directed for the next 5 days.  Work note provided to be out of work.  Covid testing done here should have results in 1 to 2 days.  Assume that she may be positive in self isolate.  Return for any new or worse symptoms.

## 2019-01-18 NOTE — ED Provider Notes (Signed)
[facility] EMERGENCY DEPARTMENT Provider Note   CSN: [id number] Arrival date & time: 01/18/19  1508     History   Chief Complaint Chief Complaint  Patient presents with  . Shortness of Breath    HPI Leon Mason is a 28 y.o. male.     Patient with a complaint of upper respiratory infection for several days.  But started to get increased shortness of breath and body aches in the past 2 days.  Associated with some nausea vomiting and diarrhea.  No blood.  No abdominal pain.  No fever.  Patient has a history of asthma and feels that he is been wheezing.  He is out of an albuterol inhaler.  No known Covid exposures.     Past Medical History:  Diagnosis Date  . Anxiety   . Asthma   . Bipolar 1 disorder ([place])   . Hallucinations   . Schizoaffective disorder ([place])   . Schizophrenia [facility])     Patient Active Problem List   Diagnosis Date Noted  . Globus sensation 07/04/2018  . Schizophrenia ([place]) 03/28/2018  . MDD (major depressive disorder), recurrent episode, severe ([place]) 03/28/2018  . Schizo-affective schizophrenia, chronic condition with acute exacerbation ([place]) 03/28/2018  . Tobacco use disorder 12/17/2016  . Schizoaffective disorder, bipolar type ([place]) 08/23/2016  . Alcohol use disorder, severe, dependence ([place]) 08/23/2016  . Allergic rhinitis due to pollen 10/09/2015  . 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 Mild persistent allergic asthma 10/09/2015  . Psychosis ([place]) 06/14/2013    Past Surgical History:  Procedure Laterality Date  . Fate EXTRACTION  2009   all four        Home Medications    Prior to Admission medications   Medication Sig Start Date End Date Taking? Authorizing Provider  Ascorbic Acid (VITAMIN C PO) Take 1 tablet by mouth daily.   Yes [provider]  benztropine (COGENTIN) 1 MG tablet Take 1 tablet (1 mg total) by mouth at bedtime as needed (for restless leg syndrome). 06/28/18  Yes Donnal Moat T, PA-C   cholecalciferol (VITAMIN D3) 25 MCG (1000 UT) tablet Take 1,000 Units by mouth daily.   Yes [provider]  Cyanocobalamin (VITAMIN B-12 PO) Take 1 tablet by mouth daily.   Yes [provider]  lithium carbonate (ESKALITH) 450 MG CR tablet Take 1 tablet (450 mg total) by mouth daily. 06/28/18  Yes Donnal Moat T, PA-C  loratadine (CLARITIN) 10 MG tablet Take 1 tablet (10 mg total) by mouth daily. (May buy from over the counter): For allergies Patient taking differently: Take 10 mg by mouth daily as needed. (May buy from over the counter): For allergies 03/31/18  Yes Nwoko, Herbert Pun I, NP  mometasone-formoterol (DULERA) 100-5 MCG/ACT AERO Inhale 2 puffs into the lungs 2 (two) times daily. For shortness of breath 07/04/18  Yes Gottschalk, Ashly M, DO  perphenazine (TRILAFON) 4 MG tablet Take 1 tablet (4 mg total) by mouth 3 (three) times daily. For mood control 06/28/18  Yes Hurst, Teresa T, PA-C  sertraline (ZOLOFT) 100 MG tablet Take 2 tablets (200 mg total) by mouth daily. 10/10/18  Yes Hurst, Helene Kelp T, PA-C  albuterol (VENTOLIN HFA) 108 (90 Base) MCG/ACT inhaler Inhale 2 puffs into the lungs every 4 (four) hours as needed. Patient not taking: Reported on 01/18/2019 10/27/18   Rolland Porter, MD  predniSONE (DELTASONE) 10 MG tablet Take 4 tablets (40 mg total) by mouth daily. 01/18/19   Fredia Sorrow, MD    Family  History Family History  Problem Relation Age of Onset  . Eczema Brother   . Post-traumatic stress disorder Brother   . COPD Maternal Aunt   . Bipolar disorder Mother   . Alcoholism Mother   . Diabetes Mother   . OCD Father   . Bipolar disorder Father   . Heart attack Maternal Grandmother   . Breast cancer Maternal Grandmother   . Cancer Paternal Grandmother   . Heart attack Paternal Grandfather   . Asthma Neg Hx   . Allergic rhinitis Neg Hx   . Angioedema Neg Hx   . Urticaria Neg Hx   . Immunodeficiency Neg Hx   . Atopy Neg Hx   . Prostate cancer Neg Hx   .  Colon cancer Neg Hx     Social History Social History   Tobacco Use  . Smoking status: Former Smoker    Packs/day: 0.00    Years: 0.50    Pack years: 0.00    Types: Cigarettes    Quit date: 05/28/2018    Years since quitting: 0.6  . Smokeless tobacco: Former Network engineer Use Topics  . Alcohol use: Yes    Alcohol/week: 56.0 standard drinks    Types: 56 Cans of beer per week  . Drug use: Yes    Types: Marijuana     Allergies   Other, Dust mite extract, and Pollen extract   Review of Systems Review of Systems  Constitutional: Negative for chills and fever.  HENT: Positive for congestion. Negative for rhinorrhea and sore throat.   Eyes: Negative for visual disturbance.  Respiratory: Positive for shortness of breath and wheezing. Negative for cough.   Cardiovascular: Negative for chest pain and leg swelling.  Gastrointestinal: Positive for diarrhea, nausea and vomiting. Negative for abdominal pain.  Genitourinary: Negative for dysuria.  Musculoskeletal: Positive for myalgias. Negative for back pain and neck pain.  Skin: Negative for rash.  Neurological: Negative for dizziness, light-headedness and headaches.  Hematological: Does not bruise/bleed easily.  Psychiatric/Behavioral: Negative for confusion.     Physical Exam Updated Vital Signs BP (!) 161/98   Pulse 93   Temp 97.8 F (36.6 C) (Oral)   Resp (!) 21   Ht 1.905 m (6\' 3" )   Wt 127 kg   SpO2 96%   BMI 35.00 kg/m   Physical Exam Vitals signs and nursing note reviewed.  Constitutional:      Appearance: Normal appearance. He is well-developed.  HENT:     Head: Normocephalic and atraumatic.  Eyes:     Extraocular Movements: Extraocular movements intact.     Conjunctiva/sclera: Conjunctivae normal.     Pupils: Pupils are equal, round, and reactive to light.  Neck:     Musculoskeletal: Normal range of motion and neck supple.  Cardiovascular:     Rate and Rhythm: Normal rate and regular rhythm.      Heart sounds: No murmur.  Pulmonary:     Effort: Pulmonary effort is normal. No respiratory distress.     Breath sounds: Wheezing present.  Abdominal:     Palpations: Abdomen is soft.     Tenderness: There is no abdominal tenderness.  Musculoskeletal: Normal range of motion.  Skin:    General: Skin is warm and dry.  Neurological:     General: No focal deficit present.     Mental Status: He is alert and oriented to person, place, and time.      ED Treatments / Results  Labs (all labs ordered are  listed, but only abnormal results are displayed) Labs Reviewed  BASIC METABOLIC PANEL - Abnormal; Notable for the following components:      Result Value   Glucose, Bld 107 (*)    All other components within normal limits  SARS CORONAVIRUS 2 (TAT 6-24 HRS)  CBC WITH DIFFERENTIAL/PLATELET    EKG EKG Interpretation  Date/Time:  Thursday January 18 2019 15:29:10 EST Ventricular Rate:  75 PR Interval:    QRS Duration: 96 QT Interval:  367 QTC Calculation: 410 R Axis:   74 Text Interpretation: Sinus rhythm Consider left atrial enlargement RSR' in V1 or V2, right VCD or RVH Confirmed by Fredia Sorrow [phone]) on 01/18/2019 3:42:04 PM   Radiology Dg Chest Portable 1 View  Result Date: 01/18/2019 CLINICAL DATA:  -shortness of breath, cough and body aches. EXAM: PORTABLE CHEST 1 VIEW COMPARISON:  09/30/2018 FINDINGS: The heart size appears normal. There is no pleural effusion or edema. Coarsened interstitial markings are identified bilaterally. No superimposed airspace consolidation. IMPRESSION: 1. No acute cardiopulmonary abnormalities. 2. Chronic interstitial coarsening compatible with interstitial lung disease. Electronically Signed   By: Kerby Moors M.D.   On: 01/18/2019 16:18    Procedures Procedures (including critical care time)  Medications Ordered in ED Medications  0.9 %  sodium chloride infusion ( Intravenous New Bag/Given 01/18/19 1735)  albuterol (VENTOLIN HFA)  108 (90 Base) MCG/ACT inhaler 2 puff (2 puffs Inhalation Given 01/18/19 1734)  sodium chloride 0.9 % bolus 1,000 mL (0 mLs Intravenous Stopped 01/18/19 1810)  ondansetron (ZOFRAN) injection 4 mg (4 mg Intravenous Given 01/18/19 1705)  methylPREDNISolone sodium succinate (SOLU-MEDROL) 125 mg/2 mL injection 125 mg (125 mg Intravenous Given 01/18/19 1705)     Initial Impression / Assessment and Plan / ED Course  I have reviewed the triage vital signs and the nursing notes.  Pertinent labs & imaging results that were available during my care of the patient were reviewed by me and considered in my medical decision making (see chart for details).        Patient feeling better after 1 L of fluids.  Also treated with albuterol inhaler received a dose of Solu-Medrol.  Patient will be discharged home without continuing the prednisone also will have an inhaler for home 2 puffs every 6 hours for 7 days at least.  Covid testing done.  Clinically suspicious although not febrile that this could represent Covid 19 infection.  Chest x-ray without any evidence of multifocal pneumonia.  But there were some interest special changes. Patient will self isolate until Covid testing has come back.  He will return for any new or worse symptoms.  Final Clinical Impressions(s) / ED Diagnoses   Final diagnoses:  Moderate asthma with exacerbation, unspecified whether persistent  Suspected COVID-19 virus infection    ED Discharge Orders         Ordered    predniSONE (DELTASONE) 10 MG tablet  Daily     01/18/19 1904           Fredia Sorrow, MD 01/18/19 1909

## 2019-01-18 NOTE — ED Triage Notes (Signed)
C/o of sob, cough, body aches, n/v/d x 4 days

## 2019-01-20 ENCOUNTER — Encounter: Payer: Self-pay | Admitting: Family Medicine

## 2019-02-16 ENCOUNTER — Emergency Department (HOSPITAL_COMMUNITY)
Admission: EM | Admit: 2019-02-16 | Discharge: 2019-02-17 | Disposition: A | Discharge status: Home / Self Care | Payer: Medicaid Other | Attending: Emergency Medicine | Admitting: Emergency Medicine

## 2019-02-16 ENCOUNTER — Other Ambulatory Visit: Payer: Self-pay

## 2019-02-16 ENCOUNTER — Encounter (HOSPITAL_COMMUNITY): Payer: Self-pay | Admitting: *Deleted

## 2019-02-16 DIAGNOSIS — Z79899 Other long term (current) drug therapy: Secondary | ICD-10-CM | POA: Insufficient documentation

## 2019-02-16 DIAGNOSIS — Z87891 Personal history of nicotine dependence: Secondary | ICD-10-CM | POA: Insufficient documentation

## 2019-02-16 DIAGNOSIS — H1089 Other conjunctivitis: Secondary | ICD-10-CM

## 2019-02-16 DIAGNOSIS — J4521 Mild intermittent asthma with (acute) exacerbation: Secondary | ICD-10-CM | POA: Insufficient documentation

## 2019-02-16 MED ORDER — ALBUTEROL SULFATE HFA 108 (90 BASE) MCG/ACT IN AERS
2.0000 | INHALATION_SPRAY | RESPIRATORY_TRACT | Status: DC | PRN
  Administered 2019-02-17: 04:00:00 2 via RESPIRATORY_TRACT
  Filled 2019-02-16: qty 6.7

## 2019-02-16 NOTE — ED Triage Notes (Signed)
Pt arrives with c/o asthma attack. Onset of nasal congestion yesterday, some wheezing today. Using inhaler (last around 30 minutes ago) with minimal relief.

## 2019-02-17 MED ORDER — ALBUTEROL (5 MG/ML) CONTINUOUS INHALATION SOLN
10.0000 mg/h | INHALATION_SOLUTION | Freq: Once | RESPIRATORY_TRACT | Status: AC
  Administered 2019-02-17: 10 mg/h via RESPIRATORY_TRACT
  Filled 2019-02-17: qty 20

## 2019-02-17 MED ORDER — ALBUTEROL SULFATE (2.5 MG/3ML) 0.083% IN NEBU
5.0000 mg | INHALATION_SOLUTION | Freq: Once | RESPIRATORY_TRACT | Status: AC
  Administered 2019-02-17: 5 mg via RESPIRATORY_TRACT
  Filled 2019-02-17: qty 6

## 2019-02-17 MED ORDER — PREDNISONE 20 MG PO TABS
60.0000 mg | ORAL_TABLET | Freq: Once | ORAL | Status: AC
  Administered 2019-02-17: 60 mg via ORAL
  Filled 2019-02-17: qty 3

## 2019-02-17 MED ORDER — TETRACAINE HCL 0.5 % OP SOLN
2.0000 [drp] | Freq: Once | OPHTHALMIC | Status: AC
  Administered 2019-02-17: 2 [drp] via OPHTHALMIC
  Filled 2019-02-17: qty 4

## 2019-02-17 MED ORDER — FLUORESCEIN SODIUM 1 MG OP STRP
1.0000 | ORAL_STRIP | Freq: Once | OPHTHALMIC | Status: AC
  Administered 2019-02-17: 1 via OPHTHALMIC
  Filled 2019-02-17: qty 1

## 2019-02-17 MED ORDER — IPRATROPIUM BROMIDE 0.02 % IN SOLN
0.5000 mg | Freq: Once | RESPIRATORY_TRACT | Status: AC
  Administered 2019-02-17: 0.5 mg via RESPIRATORY_TRACT
  Filled 2019-02-17: qty 2.5

## 2019-02-17 MED ORDER — IPRATROPIUM BROMIDE 0.02 % IN SOLN
1.0000 mg | Freq: Once | RESPIRATORY_TRACT | Status: AC
  Administered 2019-02-17: 1 mg via RESPIRATORY_TRACT
  Filled 2019-02-17: qty 5

## 2019-02-17 MED ORDER — POLYMYXIN B-TRIMETHOPRIM 10000-0.1 UNIT/ML-% OP SOLN: 1.0000 [drp] | Freq: Four times a day (QID) | OPHTHALMIC | 0 refills | Status: DC

## 2019-02-17 MED ORDER — PREDNISONE 20 MG PO TABS: 60.0000 mg | ORAL_TABLET | Freq: Every day | ORAL | 0 refills | Status: DC

## 2019-02-17 NOTE — ED Notes (Signed)
Pt d/c home per MD order. Discharge summary reviewed with pt, pt verbalizes understanding. No s/s of distress noted. Ambulatory.

## 2019-02-17 NOTE — ED Provider Notes (Signed)
TIME SEEN: 4:21 AM  CHIEF COMPLAINT: Asthma exacerbation  HPI: Leon Mason is a 28 year old male with history of schizoaffective disorder, asthma who presents to the emergency department with an asthma exacerbation that started yesterday.  Using inhaler at home without relief.  No fevers, chills, cough, diarrhea, loss of taste or smell, sore throat, body aches.  Reports no recent Covid positive exposures.  Feels like his similar asthma exacerbations.  ROS: See HPI Constitutional: no fever  Eyes: no drainage  ENT: no runny nose   Cardiovascular:  no chest pain  Resp:  SOB  GI: no vomiting GU: no dysuria Integumentary: no rash  Allergy: no hives  Musculoskeletal: no leg swelling  Neurological: no slurred speech ROS otherwise negative  PAST MEDICAL HISTORY/PAST SURGICAL HISTORY:  Past Medical History:  Diagnosis Date  . Anxiety   . Asthma   . Bipolar 1 disorder ([place])   . Hallucinations   . Schizoaffective disorder ([place])   . Schizophrenia ([place])     MEDICATIONS:  Prior to Admission medications   Medication Sig Start Date End Date Taking? Authorizing Provider  albuterol (VENTOLIN HFA) 108 (90 Base) MCG/ACT inhaler Inhale 2 puffs into the lungs every 4 (four) hours as needed. Leon Mason not taking: Reported on 01/18/2019 10/27/18   Rolland Porter, MD  Ascorbic Acid (VITAMIN C PO) Take 1 tablet by mouth daily.    [provider]  benztropine (COGENTIN) 1 MG tablet Take 1 tablet (1 mg total) by mouth at bedtime as needed (for restless leg syndrome). 06/28/18   Addison Lank, PA-C  cholecalciferol (VITAMIN D3) 25 MCG (1000 UT) tablet Take 1,000 Units by mouth daily.    [provider]  Cyanocobalamin (VITAMIN B-12 PO) Take 1 tablet by mouth daily.    [provider]  lithium carbonate (ESKALITH) 450 MG CR tablet Take 1 tablet (450 mg total) by mouth daily. 06/28/18   Donnal Moat T, PA-C  loratadine (CLARITIN) 10 MG tablet Take 1 tablet (10 mg total) by mouth daily.  (May buy from over the counter): For allergies Leon Mason taking differently: Take 10 mg by mouth daily as needed. (May buy from over the counter): For allergies 03/31/18   Lindell Spar I, NP  mometasone-formoterol (DULERA) 100-5 MCG/ACT AERO Inhale 2 puffs into the lungs 2 (two) times daily. For shortness of breath 07/04/18   Ronnie Doss M, DO  perphenazine (TRILAFON) 4 MG tablet Take 1 tablet (4 mg total) by mouth 3 (three) times daily. For mood control 06/28/18   Donnal Moat T, PA-C  predniSONE (DELTASONE) 10 MG tablet Take 4 tablets (40 mg total) by mouth daily. 01/18/19   Fredia Sorrow, MD  sertraline (ZOLOFT) 100 MG tablet Take 2 tablets (200 mg total) by mouth daily. 10/10/18   Addison Lank, PA-C    ALLERGIES:  Allergies  Allergen Reactions  . Other Hives and Shortness Of Breath    Dogs and Cats.  . Dust Mite Extract   . Pollen Extract     SOCIAL HISTORY:  Social History   Tobacco Use  . Smoking status: Former Smoker    Packs/day: 0.00    Years: 0.50    Pack years: 0.00    Types: Cigarettes    Quit date: 05/28/2018    Years since quitting: 0.7  . Smokeless tobacco: Former Network engineer Use Topics  . Alcohol use: Yes    Alcohol/week: 56.0 standard drinks    Types: 56 Cans of beer per week    FAMILY  HISTORY: Family History  Problem Relation Age of Onset  . Eczema Brother   . Post-traumatic stress disorder Brother   . COPD Maternal Aunt   . Bipolar disorder Mother   . Alcoholism Mother   . Diabetes Mother   . OCD Father   . Bipolar disorder Father   . Heart attack Maternal Grandmother   . Breast cancer Maternal Grandmother   . Cancer Paternal Grandmother   . Heart attack Paternal Grandfather   . Asthma Neg Hx   . Allergic rhinitis Neg Hx   . Angioedema Neg Hx   . Urticaria Neg Hx   . Immunodeficiency Neg Hx   . Atopy Neg Hx   . Prostate cancer Neg Hx   . Colon cancer Neg Hx     EXAM: BP (!) 152/96 (BP Location: Right Arm)   Pulse (!) 112    Temp 97.7 F (36.5 C) (Oral)   Resp 20   SpO2 97%  CONSTITUTIONAL: Alert and oriented and responds appropriately to questions.  In no distress.  Afebrile.  Nontoxic. HEAD: Normocephalic EYES: Conjunctivae clear, pupils appear equal, EOM appear intact, injected sclera of the right eye, no tearing or drainage, no fluorescein uptake or foreign body appreciated of the left eye, unable to tolerate having his intraocular pressure checked ENT: normal nose; moist mucous membranes NECK: Supple, normal ROM CARD: Regular and tachycardic; S1 and S2 appreciated; no murmurs, no clicks, no rubs, no gallops RESP: Normal chest excursion without splinting or tachypnea; Leon Mason's breath sounds are equal bilaterally, he has inspiratory and expiratory wheezing with diminished aeration at his bases bilaterally, no rhonchi or rales, no hypoxia, speaking full sentences, no distress ABD/GI: Normal bowel sounds; non-distended; soft, non-tender, no rebound, no guarding, no peritoneal signs, no hepatosplenomegaly BACK:  The back appears normal EXT: Normal ROM in all joints; no deformity noted, no edema; no cyanosis SKIN: Normal color for age and race; warm; no rash on exposed skin NEURO: Moves all extremities equally PSYCH: The Leon Mason's mood and manner are appropriate.   MEDICAL DECISION MAKING: Leon Mason here with asthma exacerbation.  No recent infectious symptoms.  No known Covid exposures.  Albuterol inhaler at home providing no relief.  Discussed with nurse who feels comfortable giving continuous nebulizer treatment.  I feel Leon Mason is low risk for COVID-19.  We will also provide with prednisone.  ED PROGRESS: 5:10 AM  On reevaluation, Leon Mason's breath sounds and aeration are improving and he is approximately halfway done with his nebulizer treatment.  He also now requested I evaluate his right eye.  States it has been burning, itching.  He does not think that he got anything into his eye.  He does not wear contacts or  glasses.  No acute vision changes or vision loss.  Will obtain visual acuity, check pressure and perform fluorescein stain.  Suspect conjunctivitis.   5:45 AM  See nursing notes for visual acuity.  No fluorescein uptake or foreign bodies seen on exam.  Unable to tolerate Tono-Pen to check his pressure but has no history of glaucoma.  Suspect conjunctivitis and will give outpatient follow-up and Polytrim drops.  Wheezing has significantly improved but he still has some end expiratory wheezing.  Requesting 1 more breathing treatment prior to discharge which I feel is reasonable.  7:10 AM  Pt's lungs are now clear.  He reports feeling much better and ready for discharge home.  No hypoxia or increased work of breathing.  Discharged with albuterol inhaler, prednisone burst and Polytrim drops  with ophthalmology follow-up.  Has a PCP to follow-up with as needed.   At this time, I do not feel there is any life-threatening condition present. I have reviewed, interpreted and discussed all results (EKG, imaging, lab, urine as appropriate) and exam findings with Leon Mason/family. I have reviewed nursing notes and appropriate previous records.  I feel the Leon Mason is safe to be discharged home without further emergent workup and can continue workup as an outpatient as needed. Discussed usual and customary return precautions. Leon Mason/family verbalize understanding and are comfortable with this plan.  Outpatient follow-up has been provided as needed. All questions have been answered.   CRITICAL CARE Performed by: Pryor Curia   Total critical care time: 40 minutes  Critical care time was exclusive of separately billable procedures and treating other patients.  Critical care was necessary to treat or prevent imminent or life-threatening deterioration.  Critical care was time spent personally by me on the following activities: development of treatment plan with Leon Mason and/or surrogate as well as nursing,  discussions with consultants, evaluation of Leon Mason's response to treatment, examination of Leon Mason, obtaining history from Leon Mason or surrogate, ordering and performing treatments and interventions, ordering and review of laboratory studies, ordering and review of radiographic studies, pulse oximetry and re-evaluation of Leon Mason's condition.   Leon Mason was evaluated in Emergency Department on 02/17/2019 for the symptoms described in the history of present illness. He was evaluated in the context of the global COVID-19 pandemic, which necessitated consideration that the Leon Mason might be at risk for infection with the SARS-CoV-2 virus that causes COVID-19. Institutional protocols and algorithms that pertain to the evaluation of patients at risk for COVID-19 are in a state of rapid change based on information released by regulatory bodies including the CDC and federal and state organizations. These policies and algorithms were followed during the Leon Mason's care in the ED.  Leon Mason was seen wearing N95, face shield, gloves.    Faraaz Wolin, Delice Bison, DO 02/17/19 [phone]

## 2019-02-18 ENCOUNTER — Other Ambulatory Visit: Payer: Self-pay

## 2019-02-18 ENCOUNTER — Inpatient Hospital Stay (HOSPITAL_COMMUNITY)
Admission: EM | Admit: 2019-02-18 | Discharge: 2019-02-20 | DRG: 203 | Disposition: A | Discharge status: Home / Self Care | Payer: Medicaid Other | Attending: Internal Medicine | Admitting: Internal Medicine

## 2019-02-18 ENCOUNTER — Encounter (HOSPITAL_COMMUNITY): Payer: Self-pay | Admitting: Emergency Medicine

## 2019-02-18 ENCOUNTER — Emergency Department (HOSPITAL_COMMUNITY): Payer: Medicaid Other

## 2019-02-18 DIAGNOSIS — F250 Schizoaffective disorder, bipolar type: Secondary | ICD-10-CM

## 2019-02-18 DIAGNOSIS — R7401 Elevation of levels of liver transaminase levels: Secondary | ICD-10-CM

## 2019-02-18 DIAGNOSIS — Z7952 Long term (current) use of systemic steroids: Secondary | ICD-10-CM

## 2019-02-18 DIAGNOSIS — B349 Viral infection, unspecified: Secondary | ICD-10-CM

## 2019-02-18 DIAGNOSIS — B9789 Other viral agents as the cause of diseases classified elsewhere: Secondary | ICD-10-CM | POA: Diagnosis present

## 2019-02-18 DIAGNOSIS — Z91048 Other nonmedicinal substance allergy status: Secondary | ICD-10-CM

## 2019-02-18 DIAGNOSIS — Z23 Encounter for immunization: Secondary | ICD-10-CM

## 2019-02-18 DIAGNOSIS — F1020 Alcohol dependence, uncomplicated: Secondary | ICD-10-CM

## 2019-02-18 DIAGNOSIS — Z79899 Other long term (current) drug therapy: Secondary | ICD-10-CM

## 2019-02-18 DIAGNOSIS — E876 Hypokalemia: Secondary | ICD-10-CM

## 2019-02-18 DIAGNOSIS — J4521 Mild intermittent asthma with (acute) exacerbation: Secondary | ICD-10-CM

## 2019-02-18 DIAGNOSIS — F419 Anxiety disorder, unspecified: Secondary | ICD-10-CM | POA: Diagnosis present

## 2019-02-18 DIAGNOSIS — F209 Schizophrenia, unspecified: Secondary | ICD-10-CM | POA: Diagnosis present

## 2019-02-18 DIAGNOSIS — R9431 Abnormal electrocardiogram [ECG] [EKG]: Secondary | ICD-10-CM | POA: Diagnosis present

## 2019-02-18 DIAGNOSIS — D649 Anemia, unspecified: Secondary | ICD-10-CM | POA: Diagnosis present

## 2019-02-18 DIAGNOSIS — R6510 Systemic inflammatory response syndrome (SIRS) of non-infectious origin without acute organ dysfunction: Secondary | ICD-10-CM

## 2019-02-18 DIAGNOSIS — Z825 Family history of asthma and other chronic lower respiratory diseases: Secondary | ICD-10-CM

## 2019-02-18 DIAGNOSIS — Z811 Family history of alcohol abuse and dependence: Secondary | ICD-10-CM

## 2019-02-18 DIAGNOSIS — Z818 Family history of other mental and behavioral disorders: Secondary | ICD-10-CM

## 2019-02-18 DIAGNOSIS — J4541 Moderate persistent asthma with (acute) exacerbation: Secondary | ICD-10-CM

## 2019-02-18 DIAGNOSIS — Z20828 Contact with and (suspected) exposure to other viral communicable diseases: Secondary | ICD-10-CM | POA: Diagnosis present

## 2019-02-18 DIAGNOSIS — Z87891 Personal history of nicotine dependence: Secondary | ICD-10-CM

## 2019-02-18 HISTORY — DX: Mild intermittent asthma with (acute) exacerbation: J45.21

## 2019-02-18 HISTORY — DX: Systemic inflammatory response syndrome (sirs) of non-infectious origin without acute organ dysfunction: R65.10

## 2019-02-18 LAB — CBC WITH DIFFERENTIAL/PLATELET
Abs Immature Granulocytes: 0.03 10*3/uL (ref 0.00–0.07)
Basophils Absolute: 0.1 10*3/uL (ref 0.0–0.1)
Basophils Relative: 1 %
Eosinophils Absolute: 0.2 10*3/uL (ref 0.0–0.5)
Eosinophils Relative: 2 %
HCT: 38.5 % — ABNORMAL LOW (ref 39.0–52.0)
Hemoglobin: 12.9 g/dL — ABNORMAL LOW (ref 13.0–17.0)
Immature Granulocytes: 0 %
Lymphocytes Relative: 17 %
Lymphs Abs: 1.3 10*3/uL (ref 0.7–4.0)
MCH: 31.2 pg (ref 26.0–34.0)
MCHC: 33.5 g/dL (ref 30.0–36.0)
MCV: 93 fL (ref 80.0–100.0)
Monocytes Absolute: 0.7 10*3/uL (ref 0.1–1.0)
Monocytes Relative: 8 %
Neutro Abs: 5.6 10*3/uL (ref 1.7–7.7)
Neutrophils Relative %: 72 %
Platelets: 160 10*3/uL (ref 150–400)
RBC: 4.14 MIL/uL — ABNORMAL LOW (ref 4.22–5.81)
RDW: 13.2 % (ref 11.5–15.5)
WBC: 7.8 10*3/uL (ref 4.0–10.5)
nRBC: 0 % (ref 0.0–0.2)

## 2019-02-18 LAB — RESPIRATORY PANEL BY PCR

## 2019-02-18 LAB — COMPREHENSIVE METABOLIC PANEL
ALT: 65 U/L — ABNORMAL HIGH (ref 0–44)
AST: 40 U/L (ref 15–41)
Albumin: 4 g/dL (ref 3.5–5.0)
Alkaline Phosphatase: 55 U/L (ref 38–126)
Anion gap: 13 (ref 5–15)
BUN: 11 mg/dL (ref 6–20)
CO2: 22 mmol/L (ref 22–32)
Calcium: 9.3 mg/dL (ref 8.9–10.3)
Chloride: 105 mmol/L (ref 98–111)
Creatinine, Ser: 0.96 mg/dL (ref 0.61–1.24)
GFR calc Af Amer: >60 mL/min (ref >60)
GFR calc non Af Amer: >60 mL/min (ref >60)
Glucose, Bld: 114 mg/dL — ABNORMAL HIGH (ref 70–99)
Potassium: 3.1 mmol/L — ABNORMAL LOW (ref 3.5–5.1)
Sodium: 140 mmol/L (ref 135–145)
Total Bilirubin: 1 mg/dL (ref 0.3–1.2)
Total Protein: 7.1 g/dL (ref 6.5–8.1)

## 2019-02-18 LAB — POC SARS CORONAVIRUS 2 AG -  ED: SARS Coronavirus 2 Ag: NEGATIVE

## 2019-02-18 LAB — LACTIC ACID, PLASMA: Lactic Acid, Venous: 1.9 mmol/L (ref 0.5–1.9)

## 2019-02-18 LAB — SARS CORONAVIRUS 2 (TAT 6-24 HRS): SARS Coronavirus 2: NEGATIVE

## 2019-02-18 MED ORDER — ENOXAPARIN SODIUM 40 MG/0.4ML ~~LOC~~ SOLN
40.0000 mg | SUBCUTANEOUS | Status: DC
  Administered 2019-02-18 – 2019-02-20 (×3): 40 mg via SUBCUTANEOUS
  Filled 2019-02-18 (×3): qty 0.4

## 2019-02-18 MED ORDER — LORAZEPAM 2 MG/ML IJ SOLN
0.0000 mg | Freq: Four times a day (QID) | INTRAMUSCULAR | Status: AC
  Administered 2019-02-18: 10:00:00 2 mg via INTRAVENOUS
  Filled 2019-02-18 (×2): qty 1

## 2019-02-18 MED ORDER — SUCRALFATE 1 GM/10ML PO SUSP
1.0000 g | Freq: Two times a day (BID) | ORAL | Status: DC
  Administered 2019-02-18 – 2019-02-20 (×5): 1 g via ORAL
  Filled 2019-02-18 (×6): qty 10

## 2019-02-18 MED ORDER — POLYMYXIN B-TRIMETHOPRIM 10000-0.1 UNIT/ML-% OP SOLN
1.0000 [drp] | Freq: Four times a day (QID) | OPHTHALMIC | Status: DC
  Administered 2019-02-19 (×3): 1 [drp] via OPHTHALMIC
  Filled 2019-02-18 (×2): qty 10

## 2019-02-18 MED ORDER — POTASSIUM CHLORIDE CRYS ER 20 MEQ PO TBCR
40.0000 meq | EXTENDED_RELEASE_TABLET | Freq: Once | ORAL | Status: AC
  Administered 2019-02-18: 40 meq via ORAL
  Filled 2019-02-18: qty 2

## 2019-02-18 MED ORDER — LORAZEPAM 0.5 MG PO TABS
1.0000 mg | ORAL_TABLET | ORAL | Status: DC | PRN
  Filled 2019-02-18: qty 4

## 2019-02-18 MED ORDER — AEROCHAMBER PLUS FLO-VU MEDIUM MISC
1.0000 | Freq: Once | Status: AC
  Administered 2019-02-18: 1

## 2019-02-18 MED ORDER — ALBUTEROL SULFATE (2.5 MG/3ML) 0.083% IN NEBU
2.5000 mg | INHALATION_SOLUTION | RESPIRATORY_TRACT | Status: DC | PRN
  Filled 2019-02-18: qty 3

## 2019-02-18 MED ORDER — GUAIFENESIN ER 600 MG PO TB12
600.0000 mg | ORAL_TABLET | Freq: Two times a day (BID) | ORAL | Status: DC
  Administered 2019-02-18 – 2019-02-20 (×5): 600 mg via ORAL
  Filled 2019-02-18 (×5): qty 1

## 2019-02-18 MED ORDER — MAGNESIUM SULFATE 2 GM/50ML IV SOLN
2.0000 g | Freq: Once | INTRAVENOUS | Status: AC
  Administered 2019-02-18: 2 g via INTRAVENOUS
  Filled 2019-02-18: qty 50

## 2019-02-18 MED ORDER — LORAZEPAM 2 MG/ML IJ SOLN: 0.0000 mg | Freq: Two times a day (BID) | INTRAMUSCULAR | Status: DC

## 2019-02-18 MED ORDER — FOLIC ACID 1 MG PO TABS
1.0000 mg | ORAL_TABLET | Freq: Every day | ORAL | Status: DC
  Administered 2019-02-18 – 2019-02-20 (×3): 1 mg via ORAL
  Filled 2019-02-18 (×3): qty 1

## 2019-02-18 MED ORDER — ACETAMINOPHEN 325 MG PO TABS
650.0000 mg | ORAL_TABLET | Freq: Four times a day (QID) | ORAL | Status: DC | PRN
  Administered 2019-02-18: 10:00:00 650 mg via ORAL
  Filled 2019-02-18: qty 2

## 2019-02-18 MED ORDER — ALBUTEROL SULFATE HFA 108 (90 BASE) MCG/ACT IN AERS
8.0000 | INHALATION_SPRAY | RESPIRATORY_TRACT | Status: DC | PRN
  Filled 2019-02-18: qty 6.7

## 2019-02-18 MED ORDER — METHYLPREDNISOLONE SODIUM SUCC 125 MG IJ SOLR
125.0000 mg | Freq: Once | INTRAMUSCULAR | Status: AC
  Administered 2019-02-18: 05:00:00 125 mg via INTRAVENOUS
  Filled 2019-02-18: qty 2

## 2019-02-18 MED ORDER — LORATADINE 10 MG PO TABS
10.0000 mg | ORAL_TABLET | Freq: Every day | ORAL | Status: DC
  Administered 2019-02-19 – 2019-02-20 (×2): 10 mg via ORAL
  Filled 2019-02-18 (×2): qty 1

## 2019-02-18 MED ORDER — BENZTROPINE MESYLATE 1 MG PO TABS
1.0000 mg | ORAL_TABLET | Freq: Every evening | ORAL | Status: DC | PRN
  Filled 2019-02-18: qty 1

## 2019-02-18 MED ORDER — ALBUTEROL (5 MG/ML) CONTINUOUS INHALATION SOLN
10.0000 mg/h | INHALATION_SOLUTION | RESPIRATORY_TRACT | Status: AC
  Administered 2019-02-18: 10 mg/h via RESPIRATORY_TRACT
  Filled 2019-02-18: qty 20

## 2019-02-18 MED ORDER — LORAZEPAM 2 MG/ML IJ SOLN
1.0000 mg | INTRAMUSCULAR | Status: DC | PRN
  Administered 2019-02-18 – 2019-02-19 (×2): 1 mg via INTRAVENOUS
  Filled 2019-02-18: qty 1

## 2019-02-18 MED ORDER — ALBUTEROL SULFATE (2.5 MG/3ML) 0.083% IN NEBU
2.5000 mg | INHALATION_SOLUTION | RESPIRATORY_TRACT | Status: DC | PRN
  Administered 2019-02-18: 2.5 mg via RESPIRATORY_TRACT

## 2019-02-18 MED ORDER — ALBUTEROL SULFATE HFA 108 (90 BASE) MCG/ACT IN AERS
8.0000 | INHALATION_SPRAY | RESPIRATORY_TRACT | Status: DC | PRN
  Administered 2019-02-18 (×2): 8 via RESPIRATORY_TRACT
  Filled 2019-02-18 (×2): qty 6.7

## 2019-02-18 MED ORDER — ACETAMINOPHEN 650 MG RE SUPP: 650.0000 mg | Freq: Four times a day (QID) | RECTAL | Status: DC | PRN

## 2019-02-18 MED ORDER — VITAMIN B-1 100 MG PO TABS
100.0000 mg | ORAL_TABLET | Freq: Every day | ORAL | Status: DC
  Administered 2019-02-18 – 2019-02-20 (×3): 100 mg via ORAL
  Filled 2019-02-18 (×3): qty 1

## 2019-02-18 MED ORDER — METHYLPREDNISOLONE SODIUM SUCC 125 MG IJ SOLR
60.0000 mg | Freq: Two times a day (BID) | INTRAMUSCULAR | Status: DC
  Administered 2019-02-18 – 2019-02-20 (×4): 60 mg via INTRAVENOUS
  Filled 2019-02-18 (×4): qty 2

## 2019-02-18 MED ORDER — ADULT MULTIVITAMIN W/MINERALS CH
1.0000 | ORAL_TABLET | Freq: Every day | ORAL | Status: DC
  Administered 2019-02-19 – 2019-02-20 (×2): 1 via ORAL
  Filled 2019-02-18 (×2): qty 1

## 2019-02-18 MED ORDER — THIAMINE HCL 100 MG/ML IJ SOLN: 100.0000 mg | Freq: Every day | INTRAMUSCULAR | Status: DC

## 2019-02-18 MED ORDER — IPRATROPIUM BROMIDE HFA 17 MCG/ACT IN AERS
2.0000 | INHALATION_SPRAY | Freq: Once | RESPIRATORY_TRACT | Status: AC
  Administered 2019-02-18: 2 via RESPIRATORY_TRACT
  Filled 2019-02-18: qty 12.9

## 2019-02-18 NOTE — ED Provider Notes (Signed)
8:31 AM pt seen and evaluated, pt has finished continuous albuterol and based on report given from prior provider he has improved somewhat.  Continues to have wheezing throughout on my exam, but good air movement.  His RR is improved but still labored when talking with him.  Sitting up in bed 02 sat decreases to 90%.  Discussed with patient and he would prefer to be admitted and is agreeable with staying.  D/w Dr. Tamala Julian, triad hospitalist for admission.     Pixie Casino, MD 02/18/19 [phone]

## 2019-02-18 NOTE — Progress Notes (Signed)
Patient arrived to [facility] from ED. Safety precautions and orders reviewed with patient. VSS. TELE applied and confirmed with Christy, CCMD. No other distress voice or noted. Will continue to monitor,  Ave Filter, RN

## 2019-02-18 NOTE — ED Notes (Signed)
Received call from individual stating she is patient's mother.  Transferred call to patient's room.

## 2019-02-18 NOTE — ED Notes (Signed)
Placed pt on 2lpm. Pt is not in distress. He appears to be a mouth breather and snores. This intervention may help increase his O2 sats.

## 2019-02-18 NOTE — ED Notes (Signed)
Pt food tray arrived. Woke pt up and sat him up in bed to eat.

## 2019-02-18 NOTE — ED Provider Notes (Signed)
[place] EMERGENCY DEPARTMENT Provider Note   CSN: [id number] Arrival date & time: 02/18/19  0010     History Chief Complaint  Patient presents with  . Shortness of Breath    Leon Mason is a 28 y.o. male with a hx of anxiety, asthma, bipolar disorder, schizoaffective disorder presents to the Emergency Department complaining of gradual, persistent, progressively worsening shortness of breath with associated wheezing onset 24 hours ago.  Patient reports he additionally has subjective fevers and chills, cough, body aches and a sore throat.  He is unsure about Covid exposures.  He does report this feels like similar asthma exacerbations.  He reports a history of hospitalization for his asthma but never intubation.  Patient reports he was seen here in the emergency department for similar symptoms last night and given more than 8 hours worth of treatments after which he felt better.  He reports that today he has had worsening shortness of breath, wheezing and dyspnea on exertion.  He reports using his inhaler numerous times without improvement.    Records reviewed.  Patient was seen and treated for asthma exacerbation last night given continuous nebulizer and discharged home after significant improvement.  He had no red flags for Covid at that time and was not hypoxic.   The history is provided by the patient and medical records. No language interpreter was used.       Past Medical History:  Diagnosis Date  . Anxiety   . Asthma   . Bipolar 1 disorder ([place])   . Hallucinations   . Schizoaffective disorder ([place])   . Schizophrenia [facility])     Patient Active Problem List   Diagnosis Date Noted  . Globus sensation 07/04/2018  . Schizophrenia ([place]) 03/28/2018  . MDD (major depressive disorder), recurrent episode, severe ([place]) 03/28/2018  . Schizo-affective schizophrenia, chronic condition with acute exacerbation ([place]) 03/28/2018  . Tobacco use disorder 12/17/2016    . Schizoaffective disorder, bipolar type ([place]) 08/23/2016  . Alcohol use disorder, severe, dependence ([place]) 08/23/2016  . Allergic rhinitis due to pollen 10/09/2015  . 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 Mild persistent allergic asthma 10/09/2015  . Psychosis ([place]) 06/14/2013    Past Surgical History:  Procedure Laterality Date  . WISDOM TOOTH EXTRACTION  2009   all four       Family History  Problem Relation Age of Onset  . Eczema Brother   . Post-traumatic stress disorder Brother   . COPD Maternal Aunt   . Bipolar disorder Mother   . Alcoholism Mother   . Diabetes Mother   . OCD Father   . Bipolar disorder Father   . Heart attack Maternal Grandmother   . Breast cancer Maternal Grandmother   . Cancer Paternal Grandmother   . Heart attack Paternal Grandfather   . Asthma Neg Hx   . Allergic rhinitis Neg Hx   . Angioedema Neg Hx   . Urticaria Neg Hx   . Immunodeficiency Neg Hx   . Atopy Neg Hx   . Prostate cancer Neg Hx   . Colon cancer Neg Hx     Social History   Tobacco Use  . Smoking status: Former Smoker    Packs/day: 0.00    Years: 0.50    Pack years: 0.00    Types: Cigarettes    Quit date: 05/28/2018    Years since quitting: 0.7  . Smokeless tobacco: Former Network engineer Use Topics  . Alcohol use: Yes    Alcohol/week:  56.0 standard drinks    Types: 56 Cans of beer per week  . Drug use: Yes    Types: Marijuana    Home Medications Prior to Admission medications   Medication Sig Start Date End Date Taking? Authorizing Provider  albuterol (VENTOLIN HFA) 108 (90 Base) MCG/ACT inhaler Inhale 2 puffs into the lungs every 4 (four) hours as needed. 10/27/18   Rolland Porter, MD  Ascorbic Acid (VITAMIN C PO) Take 1 tablet by mouth daily.    [provider]  benztropine (COGENTIN) 1 MG tablet Take 1 tablet (1 mg total) by mouth at bedtime as needed (for restless leg syndrome). 06/28/18   Addison Lank, PA-C  cholecalciferol (VITAMIN D3) 25 MCG (1000 UT)  tablet Take 1,000 Units by mouth daily.    [provider]  Cyanocobalamin (VITAMIN B-12 PO) Take 1 tablet by mouth daily.    [provider]  lithium carbonate (ESKALITH) 450 MG CR tablet Take 1 tablet (450 mg total) by mouth daily. 06/28/18   Donnal Moat T, PA-C  loratadine (CLARITIN) 10 MG tablet Take 1 tablet (10 mg total) by mouth daily. (May buy from over the counter): For allergies Patient taking differently: Take 10 mg by mouth daily as needed. (May buy from over the counter): For allergies 03/31/18   Lindell Spar I, NP  perphenazine (TRILAFON) 4 MG tablet Take 1 tablet (4 mg total) by mouth 3 (three) times daily. For mood control 06/28/18   Donnal Moat T, PA-C  predniSONE (DELTASONE) 20 MG tablet Take 3 tablets (60 mg total) by mouth daily. 02/17/19   Ward, Delice Bison, DO  sertraline (ZOLOFT) 100 MG tablet Take 2 tablets (200 mg total) by mouth daily. 10/10/18   Donnal Moat T, PA-C  trimethoprim-polymyxin b (POLYTRIM) ophthalmic solution Place 1 drop into the left eye every 6 (six) hours. For 5 days 02/17/19   Ward, Delice Bison, DO  mometasone-formoterol (DULERA) 100-5 MCG/ACT AERO Inhale 2 puffs into the lungs 2 (two) times daily. For shortness of breath Patient not taking: Reported on 02/17/2019 07/04/18 02/17/19  [name], DO    Allergies    Other, Dust mite extract, and Pollen extract  Review of Systems   Review of Systems  Constitutional: Positive for chills, fatigue and fever. Negative for appetite change.  HENT: Positive for congestion and sore throat. Negative for ear discharge, ear pain, mouth sores, postnasal drip, rhinorrhea and sinus pressure.   Eyes: Negative for visual disturbance.  Respiratory: Positive for cough, chest tightness, shortness of breath and wheezing. Negative for stridor.   Cardiovascular: Negative for chest pain, palpitations and leg swelling.  Gastrointestinal: Negative for abdominal pain, diarrhea, nausea and vomiting.    Genitourinary: Negative for dysuria, frequency, hematuria and urgency.  Musculoskeletal: Positive for myalgias. Negative for arthralgias, back pain and neck stiffness.  Skin: Negative for rash.  Neurological: Negative for syncope, light-headedness, numbness and headaches.  Hematological: Negative for adenopathy.  Psychiatric/Behavioral: The patient is not nervous/anxious.   All other systems reviewed and are negative.   Physical Exam Updated Vital Signs BP (!) 147/80   Pulse (!) 109   Temp 99.4 F (37.4 C) (Rectal)   Resp 16   Ht 6\' 3"  (1.905 m)   Wt 129.3 kg   SpO2 93%   BMI 35.62 kg/m   Physical Exam Vitals and nursing note reviewed.  Constitutional:      General: He is not in acute distress.    Appearance: He is not diaphoretic.  HENT:     Head: Normocephalic.  Eyes:     General: No scleral icterus.    Conjunctiva/sclera: Conjunctivae normal.  Cardiovascular:     Rate and Rhythm: Regular rhythm. Tachycardia present.     Pulses: Normal pulses.          Radial pulses are 2+ on the right side and 2+ on the left side.  Pulmonary:     Effort: Tachypnea, accessory muscle usage and prolonged expiration present. No respiratory distress or retractions.     Breath sounds: No stridor. Wheezing ( throughout) and rhonchi ( throughout) present.     Comments: Equal chest rise. Moderate increased work of breathing. Abdominal:     General: There is no distension.     Palpations: Abdomen is soft.     Tenderness: There is no abdominal tenderness. There is no guarding or rebound.  Musculoskeletal:     Cervical back: Normal range of motion.     Right lower leg: No edema.     Left lower leg: No edema.     Comments: Moves all extremities equally and without difficulty. No peripheral edema or calf tenderness.  Skin:    General: Skin is warm and dry.     Capillary Refill: Capillary refill takes less than 2 seconds.  Neurological:     Mental Status: He is alert.     GCS: GCS eye  subscore is 4. GCS verbal subscore is 5. GCS motor subscore is 6.     Comments: Speech is clear and goal oriented.  Psychiatric:        Mood and Affect: Mood normal.     ED Results / Procedures / Treatments   Labs (all labs ordered are listed, but only abnormal results are displayed) Labs Reviewed  CBC WITH DIFFERENTIAL/PLATELET - Abnormal; Notable for the following components:      Result Value   RBC 4.14 (*)    Hemoglobin 12.9 (*)    HCT 38.5 (*)    All other components within normal limits  COMPREHENSIVE METABOLIC PANEL - Abnormal; Notable for the following components:   Potassium 3.1 (*)    Glucose, Bld 114 (*)    ALT 65 (*)    All other components within normal limits  SARS CORONAVIRUS 2 (TAT 6-24 HRS)  LACTIC ACID, PLASMA  POC SARS CORONAVIRUS 2 AG -  ED    EKG EKG Interpretation  Date/Time:  Sunday February 18 2019 00:24:53 EST Ventricular Rate:  124 PR Interval:    QRS Duration: 102 QT Interval:  424 QTC Calculation: 609 R Axis:   86 Text Interpretation: Critical Test Result: Long QTc , superimposed on p wave Accelerated Junctional rhythm Incomplete right bundle branch block Nonspecific T wave abnormality Abnormal ECG Confirmed by Addison Lank ([id number]) on 02/18/2019 4:42:28 AM   EKG Interpretation  Date/Time:  Sunday February 18 2019 05:48:33 EST Ventricular Rate:  105 PR Interval:    QRS Duration: 117 QT Interval:  514 QTC Calculation: 680 R Axis:   75 Text Interpretation: Sinus tachycardia Incomplete right bundle branch block Confirmed by Addison Lank [phone]) on 02/18/2019 6:04:41 AM       Radiology DG Chest Port 1 View  Result Date: 02/18/2019 CLINICAL DATA:  Shortness of EXAM: PORTABLE CHEST 1 VIEW COMPARISON:  01/18/2019 FINDINGS: Cardiac shadow is stable. Lungs are well aerated bilaterally. No focal infiltrate or sizable effusion is seen. No bony abnormality is noted. IMPRESSION: No acute abnormality seen. Electronically Signed   By: Elta [place]  Lukens M.D.   On: 02/18/2019 03:53    Procedures Procedures (including critical care time)  Medications Ordered in ED Medications  albuterol (VENTOLIN HFA) 108 (90 Base) MCG/ACT inhaler 8 puff (8 puffs Inhalation Given 02/18/19 0422)  albuterol (PROVENTIL,VENTOLIN) solution continuous neb (10 mg/hr Nebulization New Bag/Given 02/18/19 0623)  potassium chloride SA (KLOR-CON) CR tablet 40 mEq (has no administration in time range)  methylPREDNISolone sodium succinate (SOLU-MEDROL) 125 mg/2 mL injection 125 mg (125 mg Intravenous Given 02/18/19 0512)  AeroChamber Plus Flo-Vu Medium MISC 1 each (1 each Other Given 02/18/19 0422)  magnesium sulfate IVPB 2 g 50 mL (0 g Intravenous Stopped 02/18/19 0640)  ipratropium (ATROVENT HFA) inhaler 2 puff (2 puffs Inhalation Given 02/18/19 0518)    ED Course  I have reviewed the triage vital signs and the nursing notes.  Pertinent labs & imaging results that were available during my care of the patient were reviewed by me and considered in my medical decision making (see chart for details).  Clinical Course as of Feb 18 656  Sun Feb 18, 2019  0441 No consolidation, groundglass opacities, pulmonary edema, widened mediastinum or pneumothorax.  I personally evaluated these images.  DG Chest Port 1 View [HM]  [phone] Covid negative.  Patient continues to wheeze.  SARS Coronavirus 2 Ag: NEGATIVE [HM]  0530 Patient continues to have significant wheezing after albuterol MDI.   [HM]  0617 No leukocytosis  WBC: 7.8 [HM]  0655 Hypokalemia.  Potassium ordered.  Potassium(!): 3.1 [HM]  0655 WNL.   Lactic Acid, Venous: 1.9 [HM]    Clinical Course User Index [HM] Wandalee Klang, Gwenlyn Perking   MDM Rules/Calculators/A&P     .                    Charlton Herzfeld Andersson was evaluated in Emergency Department on 02/18/2019 for the symptoms described in the history of present illness. He was evaluated in the context of the global COVID-19 pandemic, which necessitated  consideration that the patient might be at risk for infection with the SARS-CoV-2 virus that causes COVID-19. Institutional protocols and algorithms that pertain to the evaluation of patients at risk for COVID-19 are in a state of rapid change based on information released by regulatory bodies including the CDC and federal and state organizations. These policies and algorithms were followed during the patient's care in the ED.  Patient presents with Covid-like symptoms and asthma exacerbation.  Wheezing and rhonchi throughout.  Patient with nasal congestion.  Oral temperature 99.4.  Patient significantly tachycardic and tachypneic.  Increased work of breathing.  Will give albuterol MDI to start.  4:42 AM Covid antigen test negative.  PCR pending.  6:24 AM Chest x-ray without evidence of pneumonia or groundglass opacities.  No leukocytosis noted.  Tachycardia persists.  No hypoxia.  Tachypnea is improving some.  Patient continues to have inspiratory and expiratory wheezing after albuterol and Atrovent MDI.  Will give nebulizer.  He does have prolonged QTC.  Will monitor.  6:56 AM Hypokalemia noted and replaced orally.  Patient continues to have some increased work of breathing.  He is receiving his nebulizer.  Suspect he will need admission however decision will be made after continuous nebulizer finishes.  At shift change care was transferred to Dr. Marcha Dutton who will follow pending studies, re-evaulate and determine disposition.     Final Clinical Impression(s) / ED Diagnoses Final diagnoses:  Moderate persistent asthma with exacerbation  Hypokalemia  Prolonged Q-T interval on ECG  Rx / DC Orders ED Discharge Orders    None       Branna Cortina, Gwenlyn Perking 02/18/19 [id number]    Fatima Blank, MD 02/18/19 [phone]

## 2019-02-18 NOTE — ED Notes (Signed)
Attempted IV start x1 in right AC without success.

## 2019-02-18 NOTE — ED Triage Notes (Signed)
Pt in POV. Seen here yesterday for same. Reports asthma exacerbation unrelieved by inhaler use. Labored breathing noted in triage, wheezing present. Tachypneic and tachycardic.

## 2019-02-18 NOTE — ED Notes (Addendum)
O2 sats 91-94% while ambulating in room; HR: 140s.

## 2019-02-18 NOTE — H&P (Signed)
History and Physical    Leon Mason [id number] DOB: 11-22-1990 DOA: 02/18/2019  Referring MD/NP/PA: Townsend Roger, MD PCP: [name], DO  Patient coming from: Home  Chief Complaint: Shortness of breath  I have personally briefly reviewed patient's old medical records in [facility]   HPI: Leon Mason is a 28 y.o. male with medical history significant of asthma, bipolar 1 disorder, anxiety, and schizoaffective disorder/schizophrenia.  Presents with complaints of progressively worsening shortness of breath and wheezing over the last 3 days.  Associated symptoms include subjective fever, chills, productive cough, myalgias, nausea, vomiting, decreased appetite, and sore throat.  He reports trying to use his rescue albuterol inhaler several times a day without significant relief of symptoms.  Notes having asthma exacerbations every other month or so, but has never required intubation.  He is not on any maintenance inhalers.  He had been seen in the emergency department 2 days ago receiving several breathing treatments and prednisone with provement in symptoms to the point in which he was able to be discharged home.  However, patient reports symptoms quickly worsened and need to come back into the hospital.  Patient does not report smoking tobacco anymore, but still reports drinking a significant amount of alcohol on a daily basis.  States that he is a "functioning alcoholic".  ED Course: On admission into the emergency department patient was noted to be afebrile, tachycardic, tachypneic, and all other vital signs maintained.  Labs significant for WBC 7.8, hemoglobin 12.9, potassium 3.1, ALT 65, and lactic acid 1.9.  Point of care Covid testing was negative.  Chest x-ray did not show any acute abnormalities.  Patient was given breathing treatments, magnesium sulfate,  Solu-Medrol  125 mg IV, and potassium chloride 40 mEq.  TRH called to admit.  Review of Systems    Constitutional: Positive for chills, fever and malaise/fatigue.  HENT: Positive for sore throat. Negative for nosebleeds.   Eyes: Negative for photophobia and pain.  Respiratory: Positive for cough, sputum production, shortness of breath and wheezing. Negative for hemoptysis.   Cardiovascular: Negative for chest pain and leg swelling.  Gastrointestinal: Positive for diarrhea, nausea and vomiting. Negative for abdominal pain.  Genitourinary: Negative for dysuria and flank pain.  Musculoskeletal: Positive for myalgias. Negative for joint pain.  Skin: Negative for rash.  Neurological: Positive for weakness and headaches. Negative for focal weakness and loss of consciousness.  Endo/Heme/Allergies: Positive for environmental allergies. Negative for polydipsia.  Psychiatric/Behavioral: Positive for substance abuse. Negative for memory loss. The patient has insomnia.     Past Medical History:  Diagnosis Date  . Anxiety   . Asthma   . Bipolar 1 disorder ([place])   . Hallucinations   . Schizoaffective disorder ([place])   . Schizophrenia [facility])     Past Surgical History:  Procedure Laterality Date  . WISDOM TOOTH EXTRACTION  2009   all four     reports that he quit smoking about 8 months ago. His smoking use included cigarettes. He smoked 0.00 packs per day for 0.50 years. He has quit using smokeless tobacco. He reports current alcohol use of about 56.0 standard drinks of alcohol per week. He reports current drug use. Drug: Marijuana.  Allergies  Allergen Reactions  . Other Hives and Shortness Of Breath    Dogs and Cats.  . Dust Mite Extract   . Pollen Extract     Family History  Problem Relation Age of Onset  . Eczema Brother   . Post-traumatic  stress disorder Brother   . COPD Maternal Aunt   . Bipolar disorder Mother   . Alcoholism Mother   . Diabetes Mother   . OCD Father   . Bipolar disorder Father   . Heart attack Maternal Grandmother   . Breast cancer Maternal Grandmother    . Cancer Paternal Grandmother   . Heart attack Paternal Grandfather   . Asthma Neg Hx   . Allergic rhinitis Neg Hx   . Angioedema Neg Hx   . Urticaria Neg Hx   . Immunodeficiency Neg Hx   . Atopy Neg Hx   . Prostate cancer Neg Hx   . Colon cancer Neg Hx     Prior to Admission medications   Medication Sig Start Date End Date Taking? Authorizing Provider  albuterol (VENTOLIN HFA) 108 (90 Base) MCG/ACT inhaler Inhale 2 puffs into the lungs every 4 (four) hours as needed. 10/27/18   Rolland Porter, MD  Ascorbic Acid (VITAMIN C PO) Take 1 tablet by mouth daily.    [provider]  benztropine (COGENTIN) 1 MG tablet Take 1 tablet (1 mg total) by mouth at bedtime as needed (for restless leg syndrome). 06/28/18   Addison Lank, PA-C  cholecalciferol (VITAMIN D3) 25 MCG (1000 UT) tablet Take 1,000 Units by mouth daily.    [provider]  Cyanocobalamin (VITAMIN B-12 PO) Take 1 tablet by mouth daily.    [provider]  lithium carbonate (ESKALITH) 450 MG CR tablet Take 1 tablet (450 mg total) by mouth daily. 06/28/18   Donnal Moat T, PA-C  loratadine (CLARITIN) 10 MG tablet Take 1 tablet (10 mg total) by mouth daily. (May buy from over the counter): For allergies Patient taking differently: Take 10 mg by mouth daily as needed. (May buy from over the counter): For allergies 03/31/18   Lindell Spar I, NP  perphenazine (TRILAFON) 4 MG tablet Take 1 tablet (4 mg total) by mouth 3 (three) times daily. For mood control 06/28/18   Donnal Moat T, PA-C  predniSONE (DELTASONE) 20 MG tablet Take 3 tablets (60 mg total) by mouth daily. 02/17/19   Ward, Delice Bison, DO  sertraline (ZOLOFT) 100 MG tablet Take 2 tablets (200 mg total) by mouth daily. 10/10/18   Donnal Moat T, PA-C  trimethoprim-polymyxin b (POLYTRIM) ophthalmic solution Place 1 drop into the left eye every 6 (six) hours. For 5 days 02/17/19   Ward, Delice Bison, DO  mometasone-formoterol (DULERA) 100-5 MCG/ACT AERO Inhale 2  puffs into the lungs 2 (two) times daily. For shortness of breath Patient not taking: Reported on 02/17/2019 07/04/18 02/17/19  [name], DO    Physical Exam:  Constitutional: Young male who appears acutely sick in some respiratory distress. Vitals:   02/18/19 0600 02/18/19 0615 02/18/19 0630 02/18/19 0742  BP: (!) 143/82 (!) 141/82 131/76 (!) 142/82  Pulse: (!) 106 (!) 101 99 (!) 110  Resp: (!) 25 (!) 24 (!) 25 18  Temp:      TempSrc:      SpO2: 92% 94% 93% 95%  Weight:      Height:       Eyes: PERRL, lids and conjunctivae normal ENMT: Mucous membranes are dry. Posterior pharynx clear of any exudate or lesions. Normal dentition.  Neck: normal, supple, no masses, no thyromegaly Respiratory: Tachypneic with positive expiratory wheezes appreciated.  Patient maintaining O2 saturations around 91 to 92% on room air.  He is able to talk and nearly complete sentences. Cardiovascular: Tachycardic, no  murmurs / rubs / gallops. No extremity edema. 2+ pedal pulses. No carotid bruits.  Abdomen: no tenderness, no masses palpated. No hepatosplenomegaly. Bowel sounds positive.  Musculoskeletal: no clubbing / cyanosis. No joint deformity upper and lower extremities. Good ROM, no contractures. Normal muscle tone.  Skin: no rashes, lesions, ulcers. No induration Neurologic: CN 2-12 grossly intact. Sensation intact, DTR normal. Strength 5/5 in all 4.  Psychiatric: Normal judgment and insight. Alert and oriented x 3. Normal mood.     Labs on Admission: I have personally reviewed following labs and imaging studies  CBC: Recent Labs  Lab 02/18/19 0412  WBC 7.8  NEUTROABS 5.6  HGB 12.9*  HCT 38.5*  MCV 93.0  PLT 0000000   Basic Metabolic Panel: Recent Labs  Lab 02/18/19 0412  NA 140  K 3.1*  CL 105  CO2 22  GLUCOSE 114*  BUN 11  CREATININE 0.96  CALCIUM 9.3   GFR: Estimated Creatinine Clearance: 164.4 mL/min (by C-G formula based on SCr of 0.96 mg/dL). Liver Function  Tests: Recent Labs  Lab 02/18/19 0412  AST 40  ALT 65*  ALKPHOS 55  BILITOT 1.0  PROT 7.1  ALBUMIN 4.0   No results for input(s): LIPASE, AMYLASE in the last 168 hours. No results for input(s): AMMONIA in the last 168 hours. Coagulation Profile: No results for input(s): INR, PROTIME in the last 168 hours. Cardiac Enzymes: No results for input(s): CKTOTAL, CKMB, CKMBINDEX, TROPONINI in the last 168 hours. BNP (last 3 results) No results for input(s): PROBNP in the last 8760 hours. HbA1C: No results for input(s): HGBA1C in the last 72 hours. CBG: No results for input(s): GLUCAP in the last 168 hours. Lipid Profile: No results for input(s): CHOL, HDL, LDLCALC, TRIG, CHOLHDL, LDLDIRECT in the last 72 hours. Thyroid Function Tests: No results for input(s): TSH, T4TOTAL, FREET4, T3FREE, THYROIDAB in the last 72 hours. Anemia Panel: No results for input(s): VITAMINB12, FOLATE, FERRITIN, TIBC, IRON, RETICCTPCT in the last 72 hours. Urine analysis: No results found for: COLORURINE, APPEARANCEUR, LABSPEC, PHURINE, GLUCOSEU, HGBUR, BILIRUBINUR, KETONESUR, PROTEINUR, UROBILINOGEN, NITRITE, LEUKOCYTESUR Sepsis Labs: No results found for this or any previous visit (from the past 240 hour(s)).   Radiological Exams on Admission: DG Chest Port 1 View  Result Date: 02/18/2019 CLINICAL DATA:  Shortness of EXAM: PORTABLE CHEST 1 VIEW COMPARISON:  01/18/2019 FINDINGS: Cardiac shadow is stable. Lungs are well aerated bilaterally. No focal infiltrate or sizable effusion is seen. No bony abnormality is noted. IMPRESSION: No acute abnormality seen. Electronically Signed   By: Inez Catalina M.D.   On: 02/18/2019 03:53    EKG: Independently reviewed.  Sinus tachycardia 114 bpm with QTc 680  Assessment/Plan Asthma exacerbation: Acute.  Patient presents with progressively worsening shortness of breath, cough, and wheezing.  Chest x-ray otherwise clear.  Suspect symptoms could be secondary to some  viral -Admit to a MedSurg bed -Continuous pulse oximetry with nasal cannula oxygen as needed -Peak flow monitoring -Albuterol neb treatments 4 times daily -Solu-Medrol 60 mg every 12 hours  SIRS, viral syndrome: Patient presented with reports of fever, chills, nausea, vomiting, diarrhea, myalgias, and headache.  Found to be tachypnea and tachycardic.  White blood cell count was within normal limits, but lactic acid at the upper limit of normal.  He reports having fever body aches.  Question possible virus as cause of symptoms.  Initial point-of-care COVID-19 screening was negative. -Follow-up COVID-19 confirmatory test -Check respiratory virus panel  Hypokalemia: Acute. Initial potassium 3.1.  He  received 40 mEq of potassium chloride in the emergency department. -Continue to monitor and replace as needed  Prolonged QT interval: Acute.  Patient presents with QTc 680 -Avoid QT prolonging medications  -Correcting electrolyte abnormalities -Recheck EKG in a.m. -Continue monitoring on telemetry  Normocytic anemia: Hemoglobin 12.9 on admission, but patient blood counts had previously been 14-16. -Continue to monitor  Alcohol abuse: Patient reports being a functional alcoholic drinking a significant amount of liquor per day. -CIWA protocols with scheduled Ativan -May warrant switching to CIWA ICU protocols   Elevated transaminase level: Acute on chronic.  ALT mildly elevated at 65.  Transaminase level previously noted to be elevated in 03/2018.  May possibly be related with alcohol use. -Consider need for further work-up in outpatient setting  Schizophrenia/schizoaffective disorder with bipolar: Patient on lithium,perphenazine, and Zoloft -Holding home medications due to significantly prolonged QT -Restart medications when medically appropriate  GI prophylaxis: Carafate due to prolonged QT DVT prophylaxis: Lovenox Code Status: Full Family Communication: No family present at  bedside Disposition Plan:   Likely discharge home in a.m. if medically stable Consults called: None  Admission status: Observation Norval Morton MD [facility] Pager [phone]   If 7PM-7AM, please contact night-coverage [email or web address] Password [facility]  02/18/2019, 8:33 AM

## 2019-02-18 NOTE — ED Notes (Addendum)
Pulse rate validated at 0533 appears inaccurate as it is not consistent with ECG heart rate validated at that time.  RN in room and no change in condition noted.

## 2019-02-18 NOTE — ED Notes (Signed)
Portable x-ray in room 

## 2019-02-19 ENCOUNTER — Other Ambulatory Visit: Payer: Self-pay

## 2019-02-19 LAB — CBC WITH DIFFERENTIAL/PLATELET
Abs Immature Granulocytes: 0.07 10*3/uL (ref 0.00–0.07)
Basophils Absolute: 0 10*3/uL (ref 0.0–0.1)
Basophils Relative: 0 %
Eosinophils Absolute: 0 10*3/uL (ref 0.0–0.5)
Eosinophils Relative: 0 %
HCT: 44.7 % (ref 39.0–52.0)
Hemoglobin: 15 g/dL (ref 13.0–17.0)
Immature Granulocytes: 1 %
Lymphocytes Relative: 11 %
Lymphs Abs: 1 10*3/uL (ref 0.7–4.0)
MCH: 31.4 pg (ref 26.0–34.0)
MCHC: 33.6 g/dL (ref 30.0–36.0)
MCV: 93.5 fL (ref 80.0–100.0)
Monocytes Absolute: 0.4 10*3/uL (ref 0.1–1.0)
Monocytes Relative: 4 %
Neutro Abs: 7.2 10*3/uL (ref 1.7–7.7)
Neutrophils Relative %: 84 %
Platelets: 191 10*3/uL (ref 150–400)
RBC: 4.78 MIL/uL (ref 4.22–5.81)
RDW: 13.2 % (ref 11.5–15.5)
WBC: 8.6 10*3/uL (ref 4.0–10.5)
nRBC: 0 % (ref 0.0–0.2)

## 2019-02-19 LAB — BASIC METABOLIC PANEL
Anion gap: 11 (ref 5–15)
BUN: 11 mg/dL (ref 6–20)
CO2: 23 mmol/L (ref 22–32)
Calcium: 9.7 mg/dL (ref 8.9–10.3)
Chloride: 105 mmol/L (ref 98–111)
Creatinine, Ser: 1.06 mg/dL (ref 0.61–1.24)
GFR calc Af Amer: >60 mL/min (ref >60)
GFR calc non Af Amer: >60 mL/min (ref >60)
Glucose, Bld: 124 mg/dL — ABNORMAL HIGH (ref 70–99)
Potassium: 4.2 mmol/L (ref 3.5–5.1)
Sodium: 139 mmol/L (ref 135–145)

## 2019-02-19 LAB — MAGNESIUM: Magnesium: 2.5 mg/dL — ABNORMAL HIGH (ref 1.7–2.4)

## 2019-02-19 LAB — HIV ANTIBODY (ROUTINE TESTING W REFLEX): HIV Screen 4th Generation wRfx: NONREACTIVE

## 2019-02-19 LAB — PHOSPHORUS: Phosphorus: 3.8 mg/dL (ref 2.5–4.6)

## 2019-02-19 MED ORDER — PERPHENAZINE 4 MG PO TABS
4.0000 mg | ORAL_TABLET | Freq: Three times a day (TID) | ORAL | Status: DC
  Administered 2019-02-19 – 2019-02-20 (×4): 4 mg via ORAL
  Filled 2019-02-19 (×6): qty 1

## 2019-02-19 MED ORDER — LITHIUM CARBONATE ER 450 MG PO TBCR
450.0000 mg | EXTENDED_RELEASE_TABLET | Freq: Every day | ORAL | Status: DC
  Administered 2019-02-19 – 2019-02-20 (×2): 450 mg via ORAL
  Filled 2019-02-19 (×2): qty 1

## 2019-02-19 MED ORDER — SERTRALINE HCL 100 MG PO TABS
200.0000 mg | ORAL_TABLET | Freq: Every day | ORAL | Status: DC
  Administered 2019-02-19 – 2019-02-20 (×2): 200 mg via ORAL
  Filled 2019-02-19 (×2): qty 2

## 2019-02-19 MED ORDER — INFLUENZA VAC SPLIT QUAD 0.5 ML IM SUSY
0.5000 mL | PREFILLED_SYRINGE | INTRAMUSCULAR | Status: AC
  Administered 2019-02-20: 0.5 mL via INTRAMUSCULAR
  Filled 2019-02-19: qty 0.5

## 2019-02-19 MED ORDER — IPRATROPIUM-ALBUTEROL 0.5-2.5 (3) MG/3ML IN SOLN
3.0000 mL | Freq: Three times a day (TID) | RESPIRATORY_TRACT | Status: DC
  Administered 2019-02-19 – 2019-02-20 (×5): 3 mL via RESPIRATORY_TRACT
  Filled 2019-02-19 (×5): qty 3

## 2019-02-19 MED ORDER — BENZTROPINE MESYLATE 1 MG PO TABS
1.0000 mg | ORAL_TABLET | Freq: Every day | ORAL | Status: DC
  Filled 2019-02-19 (×2): qty 1

## 2019-02-19 MED ORDER — PNEUMOCOCCAL VAC POLYVALENT 25 MCG/0.5ML IJ INJ
0.5000 mL | INJECTION | INTRAMUSCULAR | Status: AC
  Administered 2019-02-20: 0.5 mL via INTRAMUSCULAR
  Filled 2019-02-19: qty 0.5

## 2019-02-19 MED ORDER — NICOTINE 14 MG/24HR TD PT24
14.0000 mg | MEDICATED_PATCH | Freq: Every day | TRANSDERMAL | Status: DC
  Administered 2019-02-19 – 2019-02-20 (×2): 14 mg via TRANSDERMAL
  Filled 2019-02-19 (×2): qty 1

## 2019-02-19 NOTE — Progress Notes (Signed)
CCMD called RN regarding pt elevated HR  RN went to bedside  Pt ambulating back from bathroom to bed Pt denies chest pain or palpitations  Will continue to monitor

## 2019-02-19 NOTE — Progress Notes (Signed)
PROGRESS NOTE    Leon Mason  [id number] DOB: [birth year] DOA: 02/18/2019 PCP: [name], DO  Brief Narrative:Leon Mason is a 28 y.o. male with medical history significant of asthma, bipolar 1 disorder, anxiety, and schizoaffective disorder/schizophrenia.  Presents with complaints of progressively worsening shortness of breath and wheezing over the last 3 days.  Associated symptoms include subjective fever, chills, productive cough, myalgias, nausea, vomiting, decreased appetite, and sore throat.  He reports trying to use his rescue albuterol inhaler several times a day without significant relief of symptoms, was seen in the ED 2 days ago and treated for same   Assessment & Plan:  Asthma exacerbation/rhinovirus -Admitted with cough congestion wheezing -Chest x-ray unremarkable, respiratory virus panel positive for rhinovirus, Covid PCR is negative -Still remains very dyspneic even sitting at the edge of the bed,, with diffuse expiratory wheezes and severe cough -Continue IV Solu-Medrol today, duo nebs scheduled and PRN -Flutter valve  Tobacco abuse -Counseled  Hypokalemia -Replaced  Prolonged QT interval:  -Resolved with correction of electrolyte abnormalities, QTC is 434 this morning, resume his home psych meds  Alcohol abuse: Patient reports being a functional alcoholic drinking a significant amount of liquor per day. -No withdrawal noted, monitor, continue thiamine  Schizophrenia/schizoaffective disorder with bipolar: Patient on lithium,perphenazine, and Zoloft -Restart home regimen  DVT prophylaxis: Lovenox Code Status: Full Family Communication: No family present at bedside Disposition Plan:   Likely discharge home in a.m. if medically stable   Procedures:   Antimicrobials:    Subjective: -Continues to be very short of breath this morning, feels that he is a little better from last night, continues to have a very persistent cough, driving  and waves of acute dyspnea and chest tightness with wheezing Objective: Vitals:   02/18/19 1613 02/19/19 0016 02/19/19 0147 02/19/19 0542  BP: 135/74 (!) 161/96  122/64  Pulse: 100 (!) 110  71  Resp: 20 17  16   Temp: 97.7 F (36.5 C) 98.4 F (36.9 C)  97.7 F (36.5 C)  TempSrc: Oral Oral  Oral  SpO2: 99% 96% 96% 95%  Weight:      Height:        Intake/Output Summary (Last 24 hours) at 02/19/2019 1107 Last data filed at 02/19/2019 0558 Gross per 24 hour  Intake 360 ml  Output 1000 ml  Net -640 ml   Filed Weights   02/18/19 0018  Weight: 129.3 kg    Examination:  General exam: Pleasant male sitting up in bed, uncomfortable appearing, AAO x3 Respiratory system: Poor air movement with diffuse expiratory wheezes Cardiovascular system: S1 & S2 heard, RRR  Gastrointestinal system: Abdomen is nondistended, soft and nontender.Normal bowel sounds heard. Central nervous system: Alert and oriented. No focal neurological deficits. Extremities: No edema Skin: No rashes, lesions or ulcers Psychiatry:  Mood & affect appropriate.     Data Reviewed:   CBC: Recent Labs  Lab 02/18/19 0412 02/19/19 0602  WBC 7.8 8.6  NEUTROABS 5.6 7.2  HGB 12.9* 15.0  HCT 38.5* 44.7  MCV 93.0 93.5  PLT 160 [id number]   Basic Metabolic Panel: Recent Labs  Lab 02/18/19 0412 02/19/19 0602  NA 140 139  K 3.1* 4.2  CL 105 105  CO2 22 23  GLUCOSE 114* 124*  BUN 11 11  CREATININE 0.96 1.06  CALCIUM 9.3 9.7  MG  --  2.5*  PHOS  --  3.8   GFR: Estimated Creatinine Clearance: 148.9 mL/min (by C-G formula based on SCr of  1.06 mg/dL). Liver Function Tests: Recent Labs  Lab 02/18/19 0412  AST 40  ALT 65*  ALKPHOS 55  BILITOT 1.0  PROT 7.1  ALBUMIN 4.0   No results for input(s): LIPASE, AMYLASE in the last 168 hours. No results for input(s): AMMONIA in the last 168 hours. Coagulation Profile: No results for input(s): INR, PROTIME in the last 168 hours. Cardiac Enzymes: No results for  input(s): CKTOTAL, CKMB, CKMBINDEX, TROPONINI in the last 168 hours. BNP (last 3 results) No results for input(s): PROBNP in the last 8760 hours. HbA1C: No results for input(s): HGBA1C in the last 72 hours. CBG: No results for input(s): GLUCAP in the last 168 hours. Lipid Profile: No results for input(s): CHOL, HDL, LDLCALC, TRIG, CHOLHDL, LDLDIRECT in the last 72 hours. Thyroid Function Tests: No results for input(s): TSH, T4TOTAL, FREET4, T3FREE, THYROIDAB in the last 72 hours. Anemia Panel: No results for input(s): VITAMINB12, FOLATE, FERRITIN, TIBC, IRON, RETICCTPCT in the last 72 hours. Urine analysis: No results found for: COLORURINE, APPEARANCEUR, LABSPEC, PHURINE, GLUCOSEU, HGBUR, BILIRUBINUR, KETONESUR, PROTEINUR, UROBILINOGEN, NITRITE, LEUKOCYTESUR Sepsis Labs: @LABRCNTIP (procalcitonin:4,lacticidven:4)  ) Recent Results (from the past 240 hour(s))  SARS CORONAVIRUS 2 (TAT 6-24 HRS) Nasopharyngeal Nasopharyngeal Swab     Status: None   Collection Time: 02/18/19  4:42 AM   Specimen: Nasopharyngeal Swab  Result Value Ref Range Status   SARS Coronavirus 2 NEGATIVE NEGATIVE Final    Comment: (NOTE) SARS-CoV-2 target nucleic acids are NOT DETECTED. The SARS-CoV-2 RNA is generally detectable in upper and lower respiratory specimens during the acute phase of infection. Negative results do not preclude SARS-CoV-2 infection, do not rule out co-infections with other pathogens, and should not be used as the sole basis for treatment or other patient management decisions. Negative results must be combined with clinical observations, patient history, and epidemiological information. The expected result is Negative. Fact Sheet for Patients: SugarRoll.be Fact Sheet for Healthcare Providers: https://www.woods-mathews.com/ This test is not yet approved or cleared by the [place] FDA and  has been authorized for detection and/or diagnosis of  SARS-CoV-2 by FDA under an Emergency Use Authorization (EUA). This EUA will remain  in effect (meaning this test can be used) for the duration of the COVID-19 declaration under Section 56 4(b)(1) of the Act, 21 U.S.C. section 360bbb-3(b)(1), unless the authorization is terminated or revoked sooner. Performed at [facility], [place] [address] [place], [place] [address]   Respiratory Panel by PCR     Status: Abnormal   Collection Time: 02/18/19 12:01 PM   Specimen: Nasopharyngeal Swab; Respiratory  Result Value Ref Range Status   Adenovirus NOT DETECTED NOT DETECTED Final   Coronavirus 229E NOT DETECTED NOT DETECTED Final    Comment: (NOTE) The Coronavirus on the Respiratory Panel, DOES NOT test for the novel  Coronavirus (2019 nCoV)    Coronavirus HKU1 NOT DETECTED NOT DETECTED Final   Coronavirus NL63 NOT DETECTED NOT DETECTED Final   Coronavirus OC43 NOT DETECTED NOT DETECTED Final   Metapneumovirus NOT DETECTED NOT DETECTED Final   Rhinovirus / Enterovirus DETECTED (A) NOT DETECTED Final   Influenza A NOT DETECTED NOT DETECTED Final   Influenza B NOT DETECTED NOT DETECTED Final   Parainfluenza Virus 1 NOT DETECTED NOT DETECTED Final   Parainfluenza Virus 2 NOT DETECTED NOT DETECTED Final   Parainfluenza Virus 3 NOT DETECTED NOT DETECTED Final   Parainfluenza Virus 4 NOT DETECTED NOT DETECTED Final   Respiratory Syncytial Virus NOT DETECTED NOT DETECTED Final  Bordetella pertussis NOT DETECTED NOT DETECTED Final   Chlamydophila pneumoniae NOT DETECTED NOT DETECTED Final   Mycoplasma pneumoniae NOT DETECTED NOT DETECTED Final    Comment: Performed at [facility], [place] [address], [place], [place] [address]         Radiology Studies: DG Chest Port 1 View  Result Date: 02/18/2019 CLINICAL DATA:  Shortness of EXAM: PORTABLE CHEST 1 VIEW COMPARISON:  01/18/2019 FINDINGS: Cardiac shadow is stable. Lungs are well aerated bilaterally. No focal infiltrate or sizable  effusion is seen. No bony abnormality is noted. IMPRESSION: No acute abnormality seen. Electronically Signed   By: Inez Catalina M.D.   On: 02/18/2019 03:53        Scheduled Meds: . benztropine  1 mg Oral QHS  . enoxaparin (LOVENOX) injection  40 mg Subcutaneous Q24H  . folic acid  1 mg Oral Daily  . guaiFENesin  600 mg Oral BID  . [START ON 02/20/2019] influenza vac split quadrivalent PF  0.5 mL Intramuscular Tomorrow-1000  . ipratropium-albuterol  3 mL Nebulization TID  . lithium carbonate  450 mg Oral Daily  . loratadine  10 mg Oral Daily  . LORazepam  0-4 mg Intravenous Q6H   Followed by  . [START ON 02/20/2019] LORazepam  0-4 mg Intravenous Q12H  . methylPREDNISolone (SOLU-MEDROL) injection  60 mg Intravenous Q12H  . multivitamin with minerals  1 tablet Oral Daily  . perphenazine  4 mg Oral TID  . [START ON 02/20/2019] pneumococcal 23 valent vaccine  0.5 mL Intramuscular Tomorrow-1000  . sertraline  200 mg Oral Daily  . sucralfate  1 g Oral BID  . thiamine  100 mg Oral Daily   Or  . thiamine  100 mg Intravenous Daily  . trimethoprim-polymyxin b  1 drop Left Eye Q6H   Continuous Infusions:   LOS: 0 days    Time spent: 66min  Domenic Polite, MD [facility] Page via [email or web address], password [email or web address] After 7PM please contact night-coverage  02/19/2019, 11:07 AM

## 2019-02-19 NOTE — TOC Initial Note (Signed)
Transition of Care [facility]) - Initial/Assessment Note    Patient Details  Name: Leon Mason MRN: [id number] Date of Birth: 01-03-1991  Transition of Care [facility]) CM/SW Contact:    Marilu Favre, RN Phone Number: 02/19/2019, 3:45 PM  Clinical Narrative:                 Confirmed face sheet information with patient. Patient uninsured. Explained Transitions of Care Pharmacy and [facility]. Patient voiced understanding. Patient active with PCP. Patient from home with roommates.  Expected Discharge Plan: Home/Self Care Barriers to Discharge: Continued Medical Work up   Patient Goals and CMS Choice Patient states their goals for this hospitalization and ongoing recovery are:: to go home CMS Medicare.gov Compare Post Acute Care list provided to:: Patient Choice offered to / list presented to : NA  Expected Discharge Plan and Services Expected Discharge Plan: Home/Self Care       Living arrangements for the past 2 months: Apartment                 DME Arranged: N/A         HH Arranged: NA          Prior Living Arrangements/Services Living arrangements for the past 2 months: Apartment Lives with:: Roommate Patient language and need for interpreter reviewed:: Yes Do you feel safe going back to the place where you live?: Yes      Need for Family Participation in Patient Care: Yes (Comment) Care giver support system in place?: Yes (comment)   Criminal Activity/Legal Involvement Pertinent to Current Situation/Hospitalization: No - Comment as needed  Activities of Daily Living Home Assistive Devices/Equipment: None ADL Screening (condition at time of admission) Patient's cognitive ability adequate to safely complete daily activities?: Yes Is the patient deaf or have difficulty hearing?: No Does the patient have difficulty seeing, even when wearing glasses/contacts?: No Does the patient have difficulty concentrating, remembering, or making decisions?: No Patient able to  express need for assistance with ADLs?: Yes Does the patient have difficulty dressing or bathing?: No Independently performs ADLs?: Yes (appropriate for developmental age) Does the patient have difficulty walking or climbing stairs?: No Weakness of Legs: None Weakness of Arms/Hands: None  Permission Sought/Granted   Permission granted to share information with : No              Emotional Assessment Appearance:: Appears stated age Attitude/Demeanor/Rapport: Engaged Affect (typically observed): Accepting Orientation: : Oriented to Self, Oriented to Place, Oriented to  Time, Oriented to Situation Alcohol / Substance Use: Not Applicable Psych Involvement: No (comment)  Admission diagnosis:  Hypokalemia [E87.6] Prolonged Q-T interval on ECG [R94.31] Moderate persistent asthma with exacerbation [J45.41] Mild intermittent asthma with (acute) exacerbation [J45.21] Patient Active Problem List   Diagnosis Date Noted  . Mild intermittent asthma with (acute) exacerbation 02/18/2019  . Hypokalemia 02/18/2019  . Normocytic anemia 02/18/2019  . Elevated transaminase level 02/18/2019  . Prolonged QT interval 02/18/2019  . Viral syndrome 02/18/2019  . SIRS (systemic inflammatory response syndrome) ([place]) 02/18/2019  . Globus sensation 07/04/2018  . Schizophrenia ([place]) 03/28/2018  . MDD (major depressive disorder), recurrent episode, severe ([place]) 03/28/2018  . Schizo-affective schizophrenia, chronic condition with acute exacerbation ([place]) 03/28/2018  . Tobacco use disorder 12/17/2016  . Schizoaffective disorder, bipolar type ([place]) 08/23/2016  . Alcohol use disorder, severe, dependence ([place]) 08/23/2016  . Allergic rhinitis due to pollen 10/09/2015  . 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 Mild persistent allergic asthma 10/09/2015  . Psychosis ([place]) 06/14/2013  PCP:  [name], DO Pharmacy:   [facility] [address] [place] [address] [place]  [address] Phone: [phone] Fax: [phone]  [facility] of [place], [place] - [address] [place] [address] Phone: [phone] Fax: [phone]     Social Determinants of Health (SDOH) Interventions    Readmission Risk Interventions No flowsheet data found.

## 2019-02-20 MED ORDER — PREDNISONE 20 MG PO TABS: 20.0000 mg | ORAL_TABLET | Freq: Every day | ORAL | 0 refills | Status: DC

## 2019-02-20 MED ORDER — IPRATROPIUM-ALBUTEROL 0.5-2.5 (3) MG/3ML IN SOLN: 3.0000 mL | Freq: Two times a day (BID) | RESPIRATORY_TRACT | Status: DC

## 2019-02-20 MED ORDER — ALBUTEROL SULFATE (2.5 MG/3ML) 0.083% IN NEBU: 2.5000 mg | INHALATION_SOLUTION | Freq: Four times a day (QID) | RESPIRATORY_TRACT | 1 refills | Status: DC | PRN

## 2019-02-20 MED FILL — predniSONE 20 MG TABS: 20 | 5 days supply | Qty: 8 | Fill #0

## 2019-02-20 MED FILL — ALBUTEROL SUL 2.5 MG/3 ML S: (2.5 MG/3ML | 7 days supply | Qty: 90 | Fill #0

## 2019-02-20 NOTE — TOC Transition Note (Signed)
Transition of Care [facility]) - CM/SW Discharge Note   Patient Details  Name: PLEAS NEWLON MRN: [id number] Date of Birth: [birth year]  [facility]) CM/SW Contact:  Marilu Favre, RN Phone Number: 02/20/2019, 10:48 AM   Clinical Narrative:     Prescriptions sent to [facility]. Patient entered in [facility], states he can afford co pay. Called Zack with [facility] for NEB machine.   Final next level of care: Home/Self Care Barriers to Discharge: No Barriers Identified   Patient Goals and CMS Choice Patient states their goals for this hospitalization and ongoing recovery are:: to go home CMS Medicare.gov Compare Post Acute Care list provided to:: Patient Choice offered to / list presented to : NA  Discharge Placement                       Discharge Plan and Services In-house Referral: Financial Counselor Discharge Planning Services: Medication Assistance, [place] [facility]            DME Arranged: Nebulizer machine DME Agency: [employer] Date DME Agency Contacted: 02/20/19 Time DME Agency Contacted: 79 Representative spoke with at DME Agency: [place]: NA          Social Determinants of Health ([place]) Interventions     Readmission Risk Interventions No flowsheet data found.

## 2019-02-26 ENCOUNTER — Other Ambulatory Visit: Payer: Self-pay | Admitting: Physician Assistant

## 2019-02-26 NOTE — Telephone Encounter (Signed)
Last apt 06/2018, nothing scheduled. Not sure how consistent he's taking meds based on refills

## 2019-02-28 ENCOUNTER — Encounter (HOSPITAL_COMMUNITY): Payer: Self-pay | Admitting: Emergency Medicine

## 2019-02-28 ENCOUNTER — Other Ambulatory Visit: Payer: Self-pay

## 2019-02-28 ENCOUNTER — Emergency Department (HOSPITAL_COMMUNITY)
Admission: EM | Admit: 2019-02-28 | Discharge: 2019-02-28 | Disposition: A | Discharge status: Home / Self Care | Payer: Self-pay | Attending: Emergency Medicine | Admitting: Emergency Medicine

## 2019-02-28 DIAGNOSIS — J4520 Mild intermittent asthma, uncomplicated: Secondary | ICD-10-CM | POA: Insufficient documentation

## 2019-02-28 DIAGNOSIS — S0502XD Injury of conjunctiva and corneal abrasion without foreign body, left eye, subsequent encounter: Secondary | ICD-10-CM | POA: Insufficient documentation

## 2019-02-28 DIAGNOSIS — H209 Unspecified iridocyclitis: Secondary | ICD-10-CM

## 2019-02-28 DIAGNOSIS — Z87891 Personal history of nicotine dependence: Secondary | ICD-10-CM | POA: Insufficient documentation

## 2019-02-28 DIAGNOSIS — W25XXXD Contact with sharp glass, subsequent encounter: Secondary | ICD-10-CM | POA: Insufficient documentation

## 2019-02-28 DIAGNOSIS — H20012 Primary iridocyclitis, left eye: Secondary | ICD-10-CM | POA: Insufficient documentation

## 2019-02-28 MED ORDER — ATROPINE SULFATE 1 % OP SOLN
1.0000 [drp] | Freq: Once | OPHTHALMIC | Status: AC
  Administered 2019-02-28: 1 [drp] via OPHTHALMIC
  Filled 2019-02-28: qty 2

## 2019-02-28 MED ORDER — ERYTHROMYCIN 5 MG/GM OP OINT
TOPICAL_OINTMENT | Freq: Once | OPHTHALMIC | Status: DC
  Filled 2019-02-28: qty 3.5

## 2019-02-28 MED ORDER — FLUORESCEIN SODIUM 1 MG OP STRP
1.0000 | ORAL_STRIP | Freq: Once | OPHTHALMIC | Status: AC
  Administered 2019-02-28: 1 via OPHTHALMIC
  Filled 2019-02-28: qty 1

## 2019-02-28 MED ORDER — HOMATROPINE HBR 5 % OP SOLN
2.0000 [drp] | Freq: Once | OPHTHALMIC | Status: DC
  Filled 2019-02-28: qty 5

## 2019-02-28 NOTE — ED Notes (Signed)
Patient seen and assessed by EDPa for initial eval.
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
## 2019-02-28 NOTE — Discharge Instructions (Signed)
Continue to apply the erythromycin ointment in your left eye - small ribbon 4 times daily for the next 7 days to protect your eye as this abrasion heals.  You may apply another atropine drop in your eye if the pain returns if needed to dilate your eye, but the drop we placed today may actually last for up to 2 days and continue to dilate your eye.  You will be light sensitive, you may need to wear sunglasses to minimize light.    Call Dr. Ellie Lunch for further evaluation of your eye complaint.

## 2019-03-06 NOTE — Discharge Summary (Signed)
Physician Discharge Summary  [name] [id number] DOB: Aug 29, 1990 DOA: 02/18/2019  PCP: [name], DO  Admit date: 02/18/2019 Discharge date: 02/20/2019  Time spent: 35 minutes  Recommendations for Outpatient Follow-up:  PCP in 1 week  Discharge Diagnoses:  Principal Problem:   Mild intermittent asthma with (acute) exacerbation Active Problems:   Schizoaffective disorder, bipolar type (HCC)   Alcohol use disorder, severe, dependence (HCC)   Schizophrenia (HCC)   Hypokalemia   Normocytic anemia   Elevated transaminase level   Prolonged QT interval   Viral syndrome   SIRS (systemic inflammatory response syndrome) ([place])   Discharge Condition: stable  Diet recommendation: regular  Filed Weights   02/18/19 0018  Weight: 129.3 kg    History of present illness:  Leon Mason a 28 y.o.malewith medical history significant ofasthma, bipolar 1 disorder, anxiety, and schizoaffective disorder/schizophrenia.Presents with complaints of progressively worsening shortness of breath and wheezing over the last 3 days. Associated symptoms include subjective fever, chills, productive cough, myalgias, nausea, vomiting, decreased appetite, and sore throat. He reports trying to use his rescue albuterol inhaler several times a day without significant relief of symptoms, was seen in the ED 2 days ago and treated for same  Hospital Course:   Asthma exacerbation/rhinovirus -Admitted with cough congestion wheezing -Chest x-ray unremarkable, respiratory virus panel positive for rhinovirus, Covid PCR is negative -Treated with IV steroids, nebs, flutter valve -Clinically improved and discharged home on prednisone taper, advised smoking cessation  Tobacco abuse -Counseled  Hypokalemia -Replaced  Prolonged QT interval:  -Resolved with correction of electrolyte abnormalities, QTC is 434 this morning, resumed his home psych meds  Alcohol abuse: Patient reports  being a functional alcoholic drinking a significant amount of liquor per day. -No withdrawal noted, treated with thiamine  Schizophrenia/schizoaffective disorder with bipolar: Patient on lithium,perphenazine,and Zoloft -Restarted home regimen   Discharge Exam: Vitals:   02/20/19 0942 02/20/19 1242  BP:  (!) 147/89  Pulse:  88  Resp:  18  Temp:  98.4 F (36.9 C)  SpO2: 94% 95%    General: AAOx3 Cardiovascular: S1-S2, regular rate rhythm Respiratory: Improved air movement, no wheezes  Discharge Instructions   Discharge Instructions    Increase activity slowly   Complete by: As directed      Allergies as of 02/20/2019      Reactions   Other Hives, Shortness Of Breath   Dogs and Cats.   Dust Mite Extract    Pollen Extract       Medication List    TAKE these medications   albuterol 108 (90 Base) MCG/ACT inhaler Commonly known as: VENTOLIN HFA Inhale 2 puffs into the lungs every 4 (four) hours as needed. What changed: Another medication with the same name was added. Make sure you understand how and when to take each.   albuterol (2.5 MG/3ML) 0.083% nebulizer solution Commonly known as: PROVENTIL Take 3 mLs (2.5 mg total) by nebulization every 6 (six) hours as needed for wheezing or shortness of breath. What changed: You were already taking a medication with the same name, and this prescription was added. Make sure you understand how and when to take each.   benztropine 1 MG tablet Commonly known as: COGENTIN Take 1 tablet (1 mg total) by mouth at bedtime as needed (for restless leg syndrome).   cholecalciferol 25 MCG (1000 UT) tablet Commonly known as: VITAMIN D3 Take 1,000 Units by mouth daily. Notes to patient: 02/21/2019   loratadine 10 MG tablet Commonly known as:  CLARITIN Take 1 tablet (10 mg total) by mouth daily. (May buy from over the counter): For allergies What changed: additional instructions Notes to patient: 02/21/2019   perphenazine 4 MG  tablet Commonly known as: TRILAFON Take 1 tablet (4 mg total) by mouth 3 (three) times daily. For mood control Notes to patient: 02/21/2019   predniSONE 20 MG tablet Commonly known as: DELTASONE Take 1-2 tablets (20-40 mg total) by mouth daily. Take 40mg  daily for 2days, 20mg  daily for 3days then STOP What changed:   how much to take  additional instructions Notes to patient: Follow medication instructions    sertraline 100 MG tablet Commonly known as: ZOLOFT Take 2 tablets (200 mg total) by mouth daily. Notes to patient: 02/21/2019   trimethoprim-polymyxin b ophthalmic solution Commonly known as: Polytrim Place 1 drop into the left eye every 6 (six) hours. For 5 days Notes to patient: 02/20/2019   VITAMIN B-12 PO Take 1 tablet by mouth daily. Notes to patient: 02/21/2019   VITAMIN C PO Take 1 tablet by mouth daily. Notes to patient: 02/21/2019      Allergies  Allergen Reactions  . Other Hives and Shortness Of Breath    Dogs and Cats.  . Dust Mite Extract   . Pollen Extract       The results of significant diagnostics from this hospitalization (including imaging, microbiology, ancillary and laboratory) are listed below for reference.    Significant Diagnostic Studies: DG Chest Port 1 View  Result Date: 02/18/2019 CLINICAL DATA:  Shortness of EXAM: PORTABLE CHEST 1 VIEW COMPARISON:  01/18/2019 FINDINGS: Cardiac shadow is stable. Lungs are well aerated bilaterally. No focal infiltrate or sizable effusion is seen. No bony abnormality is noted. IMPRESSION: No acute abnormality seen. Electronically Signed   By: Inez Catalina M.D.   On: 02/18/2019 03:53    Microbiology: No results found for this or any previous visit (from the past 240 hour(s)).   Labs: Basic Metabolic Panel: No results for input(s): NA, K, CL, CO2, GLUCOSE, BUN, CREATININE, CALCIUM, MG, PHOS in the last 168 hours. Liver Function Tests: No results for input(s): AST, ALT, ALKPHOS, BILITOT, PROT,  ALBUMIN in the last 168 hours. No results for input(s): LIPASE, AMYLASE in the last 168 hours. No results for input(s): AMMONIA in the last 168 hours. CBC: No results for input(s): WBC, NEUTROABS, HGB, HCT, MCV, PLT in the last 168 hours. Cardiac Enzymes: No results for input(s): CKTOTAL, CKMB, CKMBINDEX, TROPONINI in the last 168 hours. BNP: BNP (last 3 results) No results for input(s): BNP in the last 8760 hours.  ProBNP (last 3 results) No results for input(s): PROBNP in the last 8760 hours.  CBG: No results for input(s): GLUCAP in the last 168 hours.     Signed:  Domenic Polite MD.  [facility] 03/06/2019, 2:08 PM

## 2019-03-25 ENCOUNTER — Ambulatory Visit (INDEPENDENT_AMBULATORY_CARE_PROVIDER_SITE_OTHER): Payer: 59

## 2019-03-25 ENCOUNTER — Other Ambulatory Visit: Payer: Self-pay

## 2019-03-25 ENCOUNTER — Ambulatory Visit
Admission: EM | Admit: 2019-03-25 | Discharge: 2019-03-25 | Disposition: A | Discharge status: Home / Self Care | Payer: 59 | Attending: Emergency Medicine | Admitting: Emergency Medicine

## 2019-03-25 DIAGNOSIS — R05 Cough: Secondary | ICD-10-CM

## 2019-03-25 DIAGNOSIS — M549 Dorsalgia, unspecified: Secondary | ICD-10-CM | POA: Diagnosis not present

## 2019-03-25 MED ORDER — IBUPROFEN 800 MG PO TABS: 800.0000 mg | ORAL_TABLET | Freq: Three times a day (TID) | ORAL | 0 refills | Status: DC

## 2019-03-25 MED ORDER — PREDNISONE 10 MG (21) PO TBPK: ORAL_TABLET | Freq: Every day | ORAL | 0 refills | Status: DC

## 2019-03-25 NOTE — ED Triage Notes (Signed)
Patient states that he has rt side back spasms, no symptoms at this time, no cough.

## 2019-03-25 NOTE — Discharge Instructions (Addendum)
Chest x-ray is negative for cardiopulmonary disease Rest, ice and heat as needed Ensure adequate ROM as tolerated. Prescribed ibuprofen as needed for  pain relief Prescribed prednisone  Return here or go to ER if you have any new or worsening symptoms such as numbness/tingling of the inner thighs, loss of bladder or bowel control, headache/blurry vision, nausea/vomiting, confusion/altered mental status, dizziness, weakness, passing out, imbalance, etc..Marland Kitchen

## 2019-03-25 NOTE — ED Provider Notes (Signed)
[facility]    CSN: [id number] Arrival date & time: 03/25/19  1354      History   Chief Complaint Chief Complaint  Patient presents with  . Rt side back pain    HPI Leon Mason is a 29 y.o. male.   Leon Mason 29 years old male presented to the urgent care with a  complaint of right upper back pain for more than 10 days.  Report coughing symptoms in December.  He localizes the pain to the right upper back.  He describes the pain as constant and achy.  He has tried OTC medications without relief.  His symptoms are made worse with coughing, sneezing.  He denies similar symptoms in the past.       Past Medical History:  Diagnosis Date  . Anxiety   . Asthma   . Bipolar 1 disorder ([place])   . Hallucinations   . Schizoaffective disorder ([place])   . Schizophrenia [facility])     Patient Active Problem List   Diagnosis Date Noted  . Mild intermittent asthma with (acute) exacerbation 02/18/2019  . Hypokalemia 02/18/2019  . Normocytic anemia 02/18/2019  . Elevated transaminase level 02/18/2019  . Prolonged QT interval 02/18/2019  . Viral syndrome 02/18/2019  . SIRS (systemic inflammatory response syndrome) ([place]) 02/18/2019  . Globus sensation 07/04/2018  . Schizophrenia ([place]) 03/28/2018  . MDD (major depressive disorder), recurrent episode, severe ([place]) 03/28/2018  . Schizo-affective schizophrenia, chronic condition with acute exacerbation ([place]) 03/28/2018  . Tobacco use disorder 12/17/2016  . Schizoaffective disorder, bipolar type ([place]) 08/23/2016  . Alcohol use disorder, severe, dependence ([place]) 08/23/2016  . Allergic rhinitis due to pollen 10/09/2015  . 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 Mild persistent allergic asthma 10/09/2015  . Psychosis ([place]) 06/14/2013    Past Surgical History:  Procedure Laterality Date  . [place] EXTRACTION  2009   all four       Home Medications    Prior to Admission medications   Medication Sig Start Date End Date  Taking? Authorizing Provider  albuterol (PROVENTIL) (2.5 MG/3ML) 0.083% nebulizer solution Take 3 mLs (2.5 mg total) by nebulization every 6 (six) hours as needed for wheezing or shortness of breath. 02/20/19   Domenic Polite, MD  albuterol (VENTOLIN HFA) 108 (90 Base) MCG/ACT inhaler Inhale 2 puffs into the lungs every 4 (four) hours as needed. 10/27/18   Rolland Porter, MD  Ascorbic Acid (VITAMIN C PO) Take 1 tablet by mouth daily.    [provider]  benztropine (COGENTIN) 1 MG tablet Take 1 tablet (1 mg total) by mouth at bedtime as needed (for restless leg syndrome). 06/28/18   Addison Lank, PA-C  cholecalciferol (VITAMIN D3) 25 MCG (1000 UT) tablet Take 1,000 Units by mouth daily.    [provider]  Cyanocobalamin (VITAMIN B-12 PO) Take 1 tablet by mouth daily.    [provider]  ibuprofen (ADVIL) 800 MG tablet Take 1 tablet (800 mg total) by mouth 3 (three) times daily. 03/25/19   Mea Ozga, Darrelyn Hillock, FNP  lithium carbonate (ESKALITH) 450 MG CR tablet Take 1 tablet by mouth once daily 02/27/19   Donnal Moat T, PA-C  loratadine (CLARITIN) 10 MG tablet Take 1 tablet (10 mg total) by mouth daily. (May buy from over the counter): For allergies Patient taking differently: Take 10 mg by mouth daily.  03/31/18   Lindell Spar I, NP  perphenazine (TRILAFON) 4 MG tablet Take 1 tablet (4 mg total) by mouth 3 (  three) times daily. For mood control 06/28/18   Donnal Moat T, PA-C  predniSONE (STERAPRED UNI-PAK 21 TAB) 10 MG (21) TBPK tablet Take by mouth daily. Take 6 tabs by mouth daily  for 2 days, then 5 tabs for 2 days, then 4 tabs for 2 days, then 3 tabs for 2 days, 2 tabs for 2 days, then 1 tab by mouth daily for 2 days 03/25/19   Emerson Monte, FNP  sertraline (ZOLOFT) 100 MG tablet Take 2 tablets (200 mg total) by mouth daily. 10/10/18   Donnal Moat T, PA-C  trimethoprim-polymyxin b (POLYTRIM) ophthalmic solution Place 1 drop into the left eye every 6 (six) hours. For 5  days Patient not taking: Reported on 02/18/2019 02/17/19   Ward, Delice Bison, DO  mometasone-formoterol (DULERA) 100-5 MCG/ACT AERO Inhale 2 puffs into the lungs 2 (two) times daily. For shortness of breath Patient not taking: Reported on 02/17/2019 07/04/18 02/17/19  [name], DO    Family History Family History  Problem Relation Age of Onset  . Eczema Brother   . Post-traumatic stress disorder Brother   . COPD Maternal Aunt   . Bipolar disorder Mother   . Alcoholism Mother   . Diabetes Mother   . OCD Father   . Bipolar disorder Father   . Heart attack Maternal Grandmother   . Breast cancer Maternal Grandmother   . Cancer Paternal Grandmother   . Heart attack Paternal Grandfather   . Asthma Neg Hx   . Allergic rhinitis Neg Hx   . Angioedema Neg Hx   . Urticaria Neg Hx   . Immunodeficiency Neg Hx   . Atopy Neg Hx   . Prostate cancer Neg Hx   . Colon cancer Neg Hx     Social History Social History   Tobacco Use  . Smoking status: Former Smoker    Packs/day: 0.00    Years: 0.50    Pack years: 0.00    Types: Cigarettes    Quit date: 05/28/2018    Years since quitting: 0.8  . Smokeless tobacco: Former Network engineer Use Topics  . Alcohol use: Yes    Alcohol/week: 56.0 standard drinks    Types: 56 Cans of beer per week  . Drug use: Yes    Types: Marijuana     Allergies   Other, Dust mite extract, and Pollen extract   Review of Systems Review of Systems  Constitutional: Negative.   HENT: Negative.   Respiratory: Negative.   Cardiovascular: Negative.   Musculoskeletal: Positive for back pain.  All other systems reviewed and are negative.    Physical Exam Triage Vital Signs ED Triage Vitals  Enc Vitals Group     BP 03/25/19 1408 (!) 149/89     Pulse Rate 03/25/19 1408 66     Resp 03/25/19 1408 16     Temp 03/25/19 1408 (!) 97.5 F (36.4 C)     Temp Source 03/25/19 1408 Oral     SpO2 03/25/19 1408 98 %     Weight --      Height --       Head Circumference --      Peak Flow --      Pain Score 03/25/19 1423 0     Pain Loc --      Pain Edu? --      Excl. in [place]? --    No data found.  Updated Vital Signs BP (!) 149/89 (BP Location: Right Arm)  Pulse 66   Temp (!) 97.5 F (36.4 C) (Oral)   Resp 16   SpO2 98%   Visual Acuity Right Eye Distance:   Left Eye Distance:   Bilateral Distance:    Right Eye Near:   Left Eye Near:    Bilateral Near:     Physical Exam Vitals and nursing note reviewed.  Constitutional:      General: He is not in acute distress.    Appearance: Normal appearance. He is obese. He is not ill-appearing or toxic-appearing.  Cardiovascular:     Rate and Rhythm: Normal rate and regular rhythm.     Pulses: Normal pulses.     Heart sounds: Normal heart sounds. No murmur. No gallop.   Pulmonary:     Effort: Pulmonary effort is normal. No respiratory distress.     Breath sounds: Normal breath sounds. No stridor. No wheezing, rhonchi or rales.  Chest:     Chest wall: No tenderness.  Musculoskeletal:        General: No swelling, tenderness, deformity or signs of injury. Normal range of motion.     Right lower leg: No edema.     Left lower leg: No edema.  Neurological:     Mental Status: He is alert.      UC Treatments / Results  Labs (all labs ordered are listed, but only abnormal results are displayed) Labs Reviewed - No data to display  EKG   Radiology DG Chest 2 View  Result Date: 03/25/2019 CLINICAL DATA:  Cough. EXAM: CHEST - 2 VIEW COMPARISON:  February 18, 2019. FINDINGS: The heart size and mediastinal contours are within normal limits. Both lungs are clear. No pneumothorax or pleural effusion is noted. The visualized skeletal structures are unremarkable. IMPRESSION: No active cardiopulmonary disease. Electronically Signed   By: Marijo Conception M.D.   On: 03/25/2019 15:39    Procedures Procedures (including critical care time)  Medications Ordered in UC Medications - No  data to display  Initial Impression / Assessment and Plan / UC Course  I have reviewed the triage vital signs and the nursing notes.  Pertinent labs & imaging results that were available during my care of the patient were reviewed by me and considered in my medical decision making (see chart for details).     Chest x-ray was done and result was reviewed.  Results is negative for cardiopulmonary disease. Prednisone was prescribed Ibuprofen prescribed Advised patient to take medication as prescribed To go to ED for worsening of symptoms  Final Clinical Impressions(s) / UC Diagnoses   Final diagnoses:  Upper back pain on right side     Discharge Instructions     Rest, ice and heat as needed Ensure adequate ROM as tolerated. Prescribed ibuprofen as needed for  pain relief Prescribed prednisone  Return here or go to ER if you have any new or worsening symptoms such as numbness/tingling of the inner thighs, loss of bladder or bowel control, headache/blurry vision, nausea/vomiting, confusion/altered mental status, dizziness, weakness, passing out, imbalance, etc...      ED Prescriptions    Medication Sig Dispense Auth. Provider   ibuprofen (ADVIL) 800 MG tablet Take 1 tablet (800 mg total) by mouth 3 (three) times daily. 21 tablet Sahithi Ordoyne S, FNP   predniSONE (STERAPRED UNI-PAK 21 TAB) 10 MG (21) TBPK tablet Take by mouth daily. Take 6 tabs by mouth daily  for 2 days, then 5 tabs for 2 days, then 4 tabs for 2 days,  then 3 tabs for 2 days, 2 tabs for 2 days, then 1 tab by mouth daily for 2 days 42 tablet Lovada Barwick, Darrelyn Hillock, FNP     PDMP not reviewed this encounter.   Emerson Monte, [place] 03/25/19 1605

## 2019-06-06 ENCOUNTER — Ambulatory Visit: Payer: Medicaid Other | Admitting: Family Medicine

## 2019-06-06 NOTE — Progress Notes (Deleted)
Leon Mason is a 29 y.o. male presents to office today for annual physical exam examination.    Concerns today include: 1. ***  Occupation: ***, Marital status: ***, Substance use: *** Diet: ***, Exercise: *** Last eye exam: *** Last dental exam: *** Last colonoscopy: *** Last mammogram: *** Last pap smear: *** Refills needed today: *** Immunizations needed: Flu Vaccine: {YES/NO/WILD [id number]  Tdap Vaccine: {YES/NO/WILD [id number]  - every 51yrs - (<3 lifetime doses or unknown): all wounds -- look up need for Tetanus IG - (>=3 lifetime doses): clean/minor wound if >93yrs from previous; all other wounds if >52yrs from previous Zoster Vaccine: {YES/NO/WILD CARDS:18581} (those >50yo, once) Pneumonia Vaccine: {YES/NO/WILD [id number] (those w/ risk factors) - (<44yr) Both: Immunocompromised, cochlear implant, CSF leak, asplenic, sickle cell, Chronic Renal Failure - (<27yr) PPSV-23 only: Heart dz, lung disease, DM, tobacco abuse, alcoholism, cirrhosis/liver disease. - (>61yr): PPSV13 then PPSV23 in 6-12mths;  - (>47yr): repeat PPSV23 once if pt received prior to 29yo and 72yrs have passed  Past Medical History:  Diagnosis Date  . Anxiety   . Asthma   . Bipolar 1 disorder ([place])   . Hallucinations   . Schizoaffective disorder ([place])   . Schizophrenia [facility])    Social History   Socioeconomic History  . Marital status: Single    Spouse name: Not on file  . Number of children: Not on file  . Years of education: Not on file  . Highest education level: Not on file  Occupational History  . Not on file  Tobacco Use  . Smoking status: Former Smoker    Packs/day: 0.00    Years: 0.50    Pack years: 0.00    Types: Cigarettes    Quit date: 05/28/2018    Years since quitting: 1.0  . Smokeless tobacco: Former Network engineer and Sexual Activity  . Alcohol use: Yes    Alcohol/week: 56.0 standard drinks    Types: 56 Cans of beer per week  . Drug use: Yes    Types:  Marijuana  . Sexual activity: Not Currently  Other Topics Concern  . Not on file  Social History Narrative  . Not on file   Social Determinants of Health   Financial Resource Strain:   . Difficulty of Paying Living Expenses:   Food Insecurity:   . Worried About Charity fundraiser in the Last Year:   . Arboriculturist in the Last Year:   Transportation Needs:   . Film/video editor (Medical):   Marland Kitchen Lack of Transportation (Non-Medical):   Physical Activity:   . Days of Exercise per Week:   . Minutes of Exercise per Session:   Stress:   . Feeling of Stress :   Social Connections:   . Frequency of Communication with Friends and Family:   . Frequency of Social Gatherings with Friends and Family:   . Attends Religious Services:   . Active Member of Clubs or Organizations:   . Attends Archivist Meetings:   Marland Kitchen Marital Status:   Intimate Partner Violence:   . Fear of Current or Ex-Partner:   . Emotionally Abused:   Marland Kitchen Physically Abused:   . Sexually Abused:    Past Surgical History:  Procedure Laterality Date  . [place] TO[place] EXTRACTION  2009   all four   Family History  Problem Relation Age of Onset  . Eczema Brother   . Post-traumatic stress disorder Brother   . COPD Maternal Aunt   .  Bipolar disorder Mother   . Alcoholism Mother   . Diabetes Mother   . OCD Father   . Bipolar disorder Father   . Heart attack Maternal Grandmother   . Breast cancer Maternal Grandmother   . Cancer Paternal Grandmother   . Heart attack Paternal Grandfather   . Asthma Neg Hx   . Allergic rhinitis Neg Hx   . Angioedema Neg Hx   . Urticaria Neg Hx   . Immunodeficiency Neg Hx   . Atopy Neg Hx   . Prostate cancer Neg Hx   . Colon cancer Neg Hx     Current Outpatient Medications:  .  albuterol (PROVENTIL) (2.5 MG/3ML) 0.083% nebulizer solution, Take 3 mLs (2.5 mg total) by nebulization every 6 (six) hours as needed for wheezing or shortness of breath., Disp: 30 mL, Rfl: 1 .   albuterol (VENTOLIN HFA) 108 (90 Base) MCG/ACT inhaler, Inhale 2 puffs into the lungs every 4 (four) hours as needed., Disp: 6.7 g, Rfl: 0 .  Ascorbic Acid (VITAMIN C PO), Take 1 tablet by mouth daily., Disp: , Rfl:  .  benztropine (COGENTIN) 1 MG tablet, Take 1 tablet (1 mg total) by mouth at bedtime as needed (for restless leg syndrome)., Disp: 30 tablet, Rfl: 2 .  cholecalciferol (VITAMIN D3) 25 MCG (1000 UT) tablet, Take 1,000 Units by mouth daily., Disp: , Rfl:  .  Cyanocobalamin (VITAMIN B-12 PO), Take 1 tablet by mouth daily., Disp: , Rfl:  .  ibuprofen (ADVIL) 800 MG tablet, Take 1 tablet (800 mg total) by mouth 3 (three) times daily., Disp: 21 tablet, Rfl: 0 .  lithium carbonate (ESKALITH) 450 MG CR tablet, Take 1 tablet by mouth once daily, Disp: 30 tablet, Rfl: 0 .  loratadine (CLARITIN) 10 MG tablet, Take 1 tablet (10 mg total) by mouth daily. (May buy from over the counter): For allergies (Patient taking differently: Take 10 mg by mouth daily. ), Disp: 30 tablet, Rfl: 11 .  perphenazine (TRILAFON) 4 MG tablet, Take 1 tablet (4 mg total) by mouth 3 (three) times daily. For mood control, Disp: 90 tablet, Rfl: 2 .  predniSONE (STERAPRED UNI-PAK 21 TAB) 10 MG (21) TBPK tablet, Take by mouth daily. Take 6 tabs by mouth daily  for 2 days, then 5 tabs for 2 days, then 4 tabs for 2 days, then 3 tabs for 2 days, 2 tabs for 2 days, then 1 tab by mouth daily for 2 days, Disp: 42 tablet, Rfl: 0 .  sertraline (ZOLOFT) 100 MG tablet, Take 2 tablets (200 mg total) by mouth daily., Disp: 60 tablet, Rfl: 2 .  trimethoprim-polymyxin b (POLYTRIM) ophthalmic solution, Place 1 drop into the left eye every 6 (six) hours. For 5 days (Patient not taking: Reported on 02/18/2019), Disp: 10 mL, Rfl: 0  Allergies  Allergen Reactions  . Other Hives and Shortness Of Breath    Dogs and Cats.  . Dust Mite Extract   . Pollen Extract      ROS: Review of Systems {ros; complete:30496}    Physical exam {Exam,  [phone]}    Assessment/ Plan: Leon Mason here for annual physical exam.   No problem-specific Assessment & Plan notes found for this encounter.   Counseled on healthy lifestyle choices, including diet (rich in fruits, vegetables and lean meats and low in salt and simple carbohydrates) and exercise (at least 30 minutes of moderate physical activity daily).  Patient to follow up in 1 year for annual exam or sooner  if needed.  Trumaine Wimer M. Lajuana Ripple, DO

## 2019-06-12 ENCOUNTER — Encounter: Payer: Self-pay | Admitting: Family Medicine

## 2019-06-12 IMAGING — DX DG CHEST 2V
2 series · 2 of 2 positions shown · non-contrast
Comparison: 12/25/2014

CLINICAL DATA: Shortness of breath and fever over the last day.
Smoking history.

EXAM:
CHEST - 2 VIEW

[chest pa]
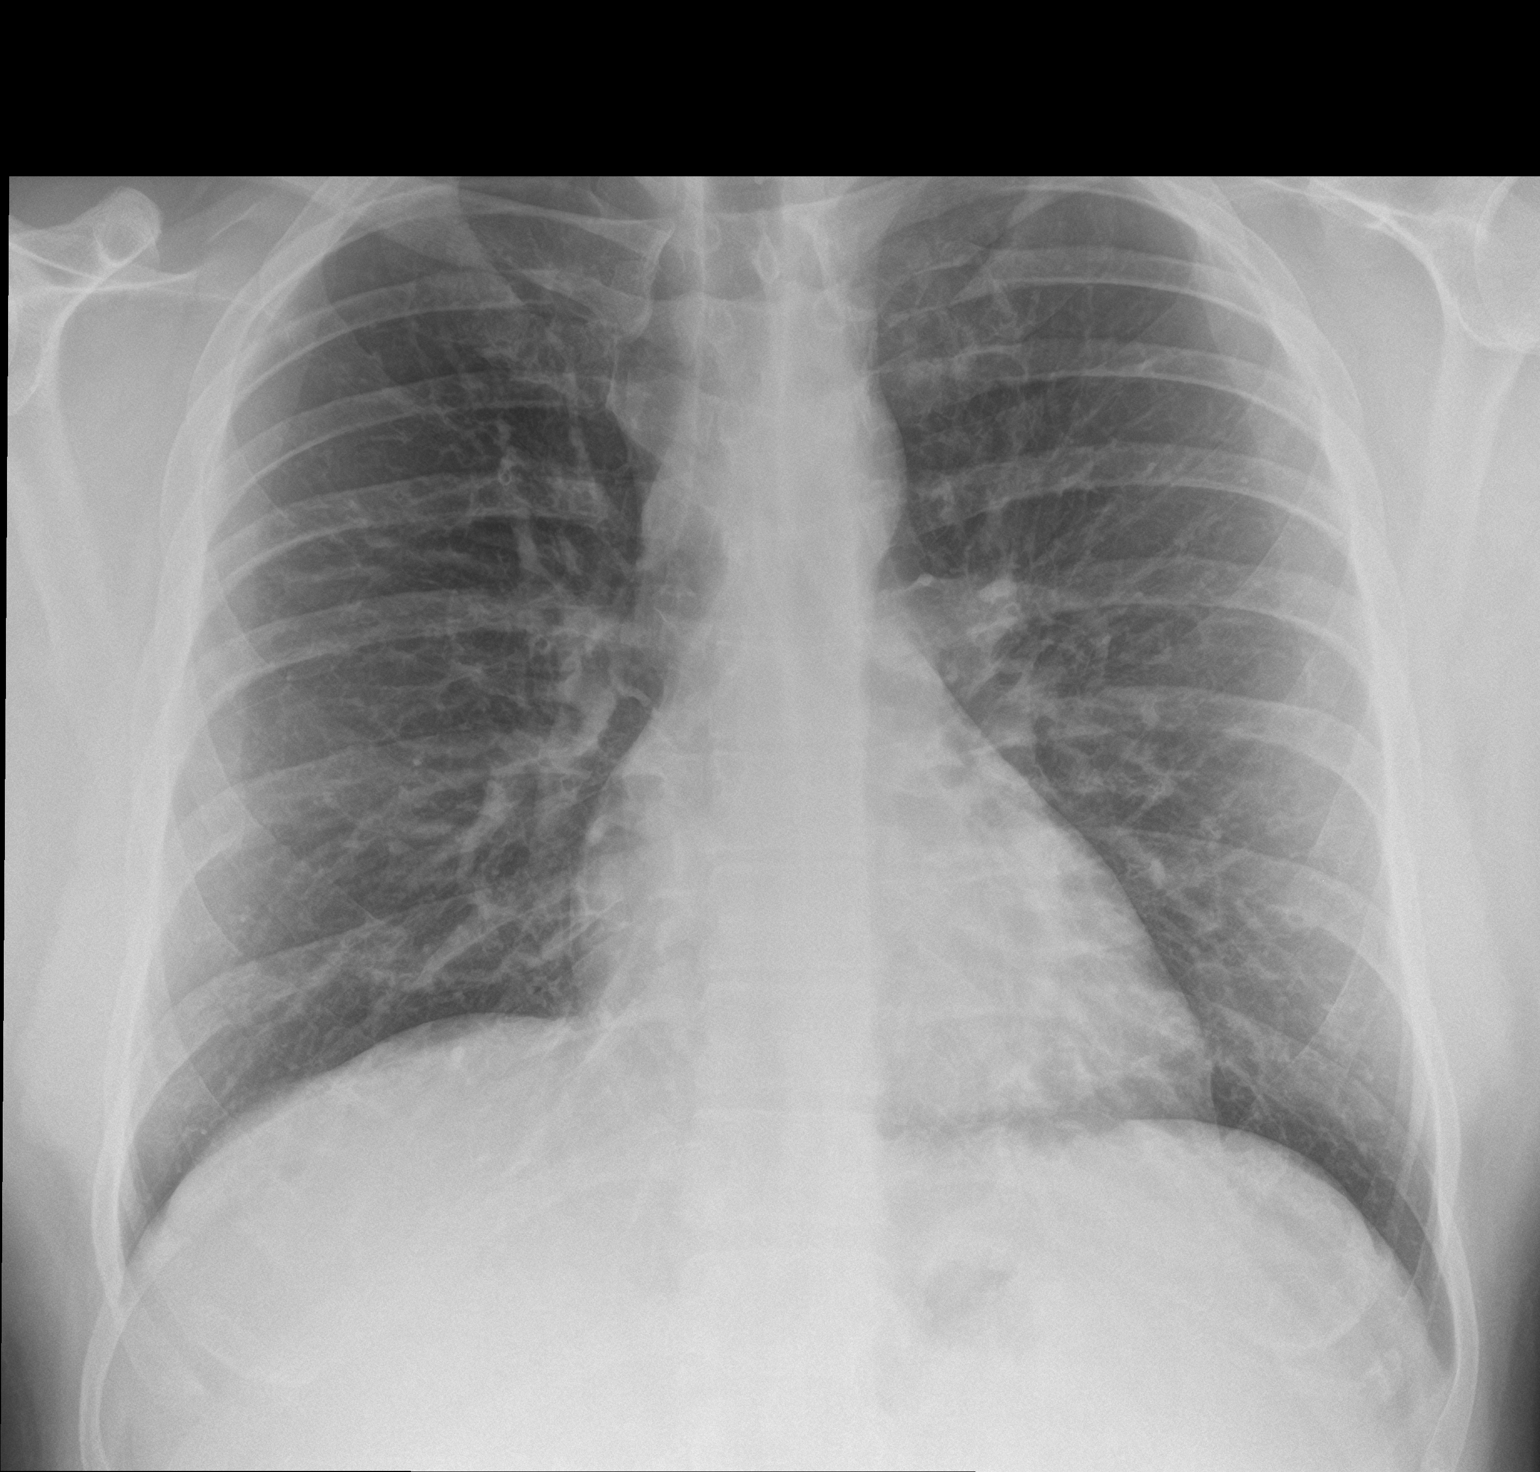

[chest lat]
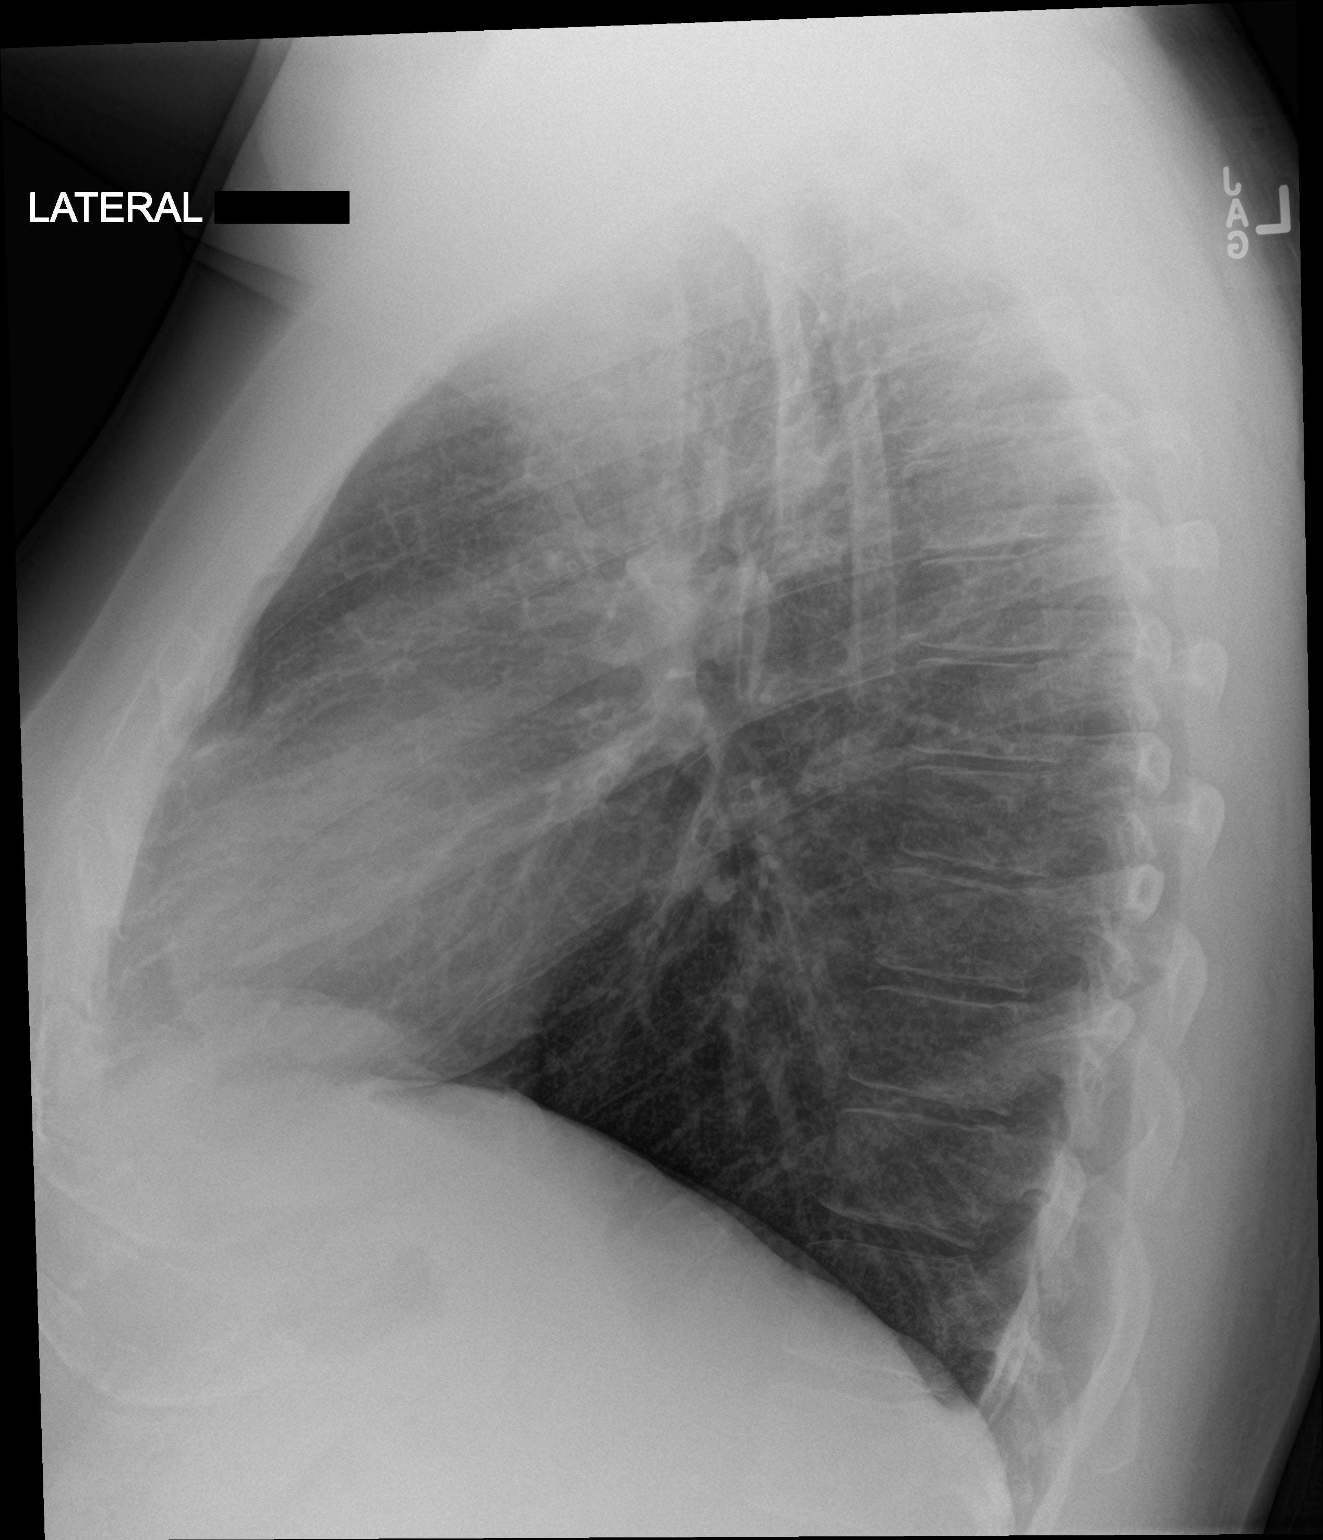

[2 of 2 positions shown; findings below may reference images not displayed]

FINDINGS: Heart size is normal. Mediastinal shadows are normal. There is
central bronchial thickening but no infiltrate, collapse or
effusion. No bone abnormality.
IMPRESSION: Bronchitis pattern.  No consolidation or collapse.

## 2019-09-18 ENCOUNTER — Other Ambulatory Visit: Payer: Self-pay | Admitting: Physician Assistant

## 2019-09-19 ENCOUNTER — Other Ambulatory Visit: Payer: Self-pay

## 2019-09-19 ENCOUNTER — Ambulatory Visit (INDEPENDENT_AMBULATORY_CARE_PROVIDER_SITE_OTHER): Payer: 59 | Admitting: Psychiatry

## 2019-09-19 DIAGNOSIS — F250 Schizoaffective disorder, bipolar type: Secondary | ICD-10-CM

## 2019-09-19 DIAGNOSIS — F1020 Alcohol dependence, uncomplicated: Secondary | ICD-10-CM

## 2019-09-19 DIAGNOSIS — Z566 Other physical and mental strain related to work: Secondary | ICD-10-CM

## 2019-09-19 DIAGNOSIS — Z638 Other specified problems related to primary support group: Secondary | ICD-10-CM | POA: Diagnosis not present

## 2019-09-19 DIAGNOSIS — F1290 Cannabis use, unspecified, uncomplicated: Secondary | ICD-10-CM

## 2019-09-19 DIAGNOSIS — Z636 Dependent relative needing care at home: Secondary | ICD-10-CM
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
## 2019-09-24 ENCOUNTER — Other Ambulatory Visit: Payer: Self-pay | Admitting: Physician Assistant

## 2019-10-10 ENCOUNTER — Encounter: Payer: Self-pay | Admitting: Psychiatry

## 2019-10-10 ENCOUNTER — Ambulatory Visit: Payer: 59 | Admitting: Psychiatry

## 2019-10-10 NOTE — Progress Notes (Signed)
Admin note for non-service contact  Patient ID: Leon Mason  MRN: [id number] DATE: 10/10/2019  Noshow for 2nd session back in care.  PT has a long history of short-lived motivation and organization returning to treatment and father -- a regular patient elsewhere in this office -- covering his fees.  Remains welcome, should reschedule according to his present interest, should be responsible for failed appointment per policy.  Blanchie Serve, PhD [name], PhD LP Clinical Psychologist, [facility] [address] [place], [place] [address] [phone]

## 2019-10-31 ENCOUNTER — Encounter: Payer: Self-pay | Admitting: Family Medicine

## 2019-10-31 ENCOUNTER — Other Ambulatory Visit: Payer: Self-pay | Admitting: Family Medicine

## 2019-11-02 ENCOUNTER — Other Ambulatory Visit: Payer: Self-pay | Admitting: Family Medicine

## 2019-11-02 MED ORDER — ALBUTEROL SULFATE HFA 108 (90 BASE) MCG/ACT IN AERS: 2.0000 | INHALATION_SPRAY | RESPIRATORY_TRACT | 0 refills | Status: DC | PRN

## 2019-12-04 ENCOUNTER — Ambulatory Visit: Payer: 59 | Admitting: Psychiatry

## 2019-12-04 ENCOUNTER — Encounter: Payer: Self-pay | Admitting: Psychiatry

## 2019-12-04 NOTE — Progress Notes (Signed)
Admin note for non-service contact  Patient ID: Leon Mason  MRN: [id number] DATE: 12/04/2019  Noshow for 3pm appointment.  Typically very sporadic in scheduling, several years' history of intermittent noshows, last time Aug 4.  Noted on calendar for a med management visit with Garwin Brothers, PMNHP-BC in 3 weeks, after having seen Donnal Moat, PA-C several times following the death of his longterm psychiatrist.  Blanchie Serve, PhD [name], PhD LP Clinical Psychologist, [facility] [address], [place], [place] [address] [phone]

## 2019-12-07 ENCOUNTER — Ambulatory Visit: Payer: 59 | Admitting: Physician Assistant

## 2019-12-26 ENCOUNTER — Other Ambulatory Visit: Payer: Self-pay

## 2019-12-26 ENCOUNTER — Ambulatory Visit (INDEPENDENT_AMBULATORY_CARE_PROVIDER_SITE_OTHER): Payer: 59 | Admitting: Adult Health

## 2019-12-26 ENCOUNTER — Encounter: Payer: Self-pay | Admitting: Adult Health

## 2019-12-26 VITALS — BP 128/92 | HR 96 | Ht 75.0 in

## 2019-12-26 DIAGNOSIS — G4700 Insomnia, unspecified: Secondary | ICD-10-CM | POA: Diagnosis not present

## 2019-12-26 DIAGNOSIS — F411 Generalized anxiety disorder: Secondary | ICD-10-CM

## 2019-12-26 DIAGNOSIS — Z8659 Personal history of other mental and behavioral disorders: Secondary | ICD-10-CM

## 2019-12-26 DIAGNOSIS — F1020 Alcohol dependence, uncomplicated: Secondary | ICD-10-CM

## 2019-12-26 DIAGNOSIS — F203 Undifferentiated schizophrenia: Secondary | ICD-10-CM | POA: Diagnosis not present

## 2019-12-26 DIAGNOSIS — F1290 Cannabis use, unspecified, uncomplicated: Secondary | ICD-10-CM

## 2019-12-26 MED ORDER — SERTRALINE HCL 50 MG PO TABS: 50.0000 mg | ORAL_TABLET | Freq: Every day | ORAL | 2 refills | Status: DC

## 2019-12-26 NOTE — Progress Notes (Signed)
[facility] MD/PA/NP Initial Note  12/26/2019 12:44 PM Leon Mason  MRN:  [id number]  Chief Complaint:   HPI:   Describes mood today as "steady". Pleasant. Mood symptoms - reports depression, anxiety, and irritability. Stating "I feel like I need something for my mood". Feels tense all the time. Gets angry easily. External stimulus effects me very easily. Has taken Zoloft previously and feels like it was helpful and would like to restart it. Has also taken Trilafon and Lithium in the past. Does not want to restart any other medications at this time. Stating "they were miracle for me at one time, but I'm not sure I want to be on those medications anymore". Also stating "I'm extremely aware of surroundings". Stopped smoking "weed" a few weeks ago. Had been smoking every day for a year. Stopped smoking cigarettes. Vapes a "little". Drinking nightly - getting drunk. Drinks 12 ounces - 12 seltzers a night.  Trying to quit, it's just hard. Denies hallucinations, suicidal ideations, does not hear voices, tv not talking to me, and I'm not delusional. Varying interest and motivation. Seeing [name] for therapy.  Energy levels good - "I have good energy", "natural energy". Limits caffeine use. Active, does not have a regular exercise routine. Enjoys some usual interests and activities. Single. Lives with mother (confined to a wheel chair) - takes care of her. Also has 2 cats and a dog. Spending time with family - Aunt and uncle local. . Appetite adequate. Weight loss 15 pounds since stopping marijuana. Sleeps well most nights. Averages 8 to 10 hours. Having nightmares since stopping marijuana use.  Focus and concentration stable. Completing tasks. Managing aspects of household. Works full time at [employer] x 1 and 1/2 months. College full time IT. Denies SI or HI.  Denies AH or VH.   Previous medications: Perphenazine, Lithium, Sertraline, Haldol, Zyprexa, Risperdal, stopped due to various side  effects, ie biting tongue a lot, restless legs, weight gain   Visit Diagnosis:    ICD-10-CM   1. 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 Generalized anxiety disorder  F41.1   3. Insomnia, unspecified type  G47.00   4. History of major depression  Z86.59   5. Alcohol use disorder, severe, dependence ([place])  F10.20   6. Marijuana use  F12.90     Past Psychiatric History: Multiple psychiatric hospitalizations.  Past Medical History:  Past Medical History:  Diagnosis Date  . Anxiety   . Asthma   . Bipolar 1 disorder ([place])   . Hallucinations   . Schizoaffective disorder ([place])   . Schizophrenia [facility])     Past Surgical History:  Procedure Laterality Date  . WISDOM TOOTH EXTRACTION  2009   all four    Family Psychiatric History: Mental health issues within family.  Family History:  Family History  Problem Relation Age of Onset  . Eczema Brother   . Post-traumatic stress disorder Brother   . COPD Maternal Aunt   . Bipolar disorder Mother   . Alcoholism Mother   . Diabetes Mother   . OCD Father   . Bipolar disorder Father   . Heart attack Maternal Grandmother   . Breast cancer Maternal Grandmother   . Cancer Paternal Grandmother   . Heart attack Paternal Grandfather   . Asthma Neg Hx   . Allergic rhinitis Neg Hx   . Angioedema Neg Hx   . Urticaria Neg Hx   . Immunodeficiency Neg Hx   . Atopy Neg Hx   .  Prostate cancer Neg Hx   . Colon cancer Neg Hx     Social History:  Social History   Socioeconomic History  . Marital status: Single    Spouse name: Not on file  . Number of children: Not on file  . Years of education: Not on file  . Highest education level: Not on file  Occupational History  . Not on file  Tobacco Use  . Smoking status: Former Smoker    Packs/day: 0.00    Years: 0.50    Pack years: 0.00    Types: Cigarettes    Quit date: 05/28/2018    Years since quitting: 1.5  . Smokeless tobacco: Former Network engineer  . Vaping Use: Never  used  Substance and Sexual Activity  . Alcohol use: Yes    Alcohol/week: 56.0 standard drinks    Types: 56 Cans of beer per week  . Drug use: Yes    Types: Marijuana  . Sexual activity: Not Currently  Other Topics Concern  . Not on file  Social History Narrative  . Not on file   Social Determinants of Health   Financial Resource Strain:   . Difficulty of Paying Living Expenses: Not on file  Food Insecurity:   . Worried About Charity fundraiser in the Last Year: Not on file  . Ran Out of Food in the Last Year: Not on file  Transportation Needs:   . Lack of Transportation (Medical): Not on file  . Lack of Transportation (Non-Medical): Not on file  Physical Activity:   . Days of Exercise per Week: Not on file  . Minutes of Exercise per Session: Not on file  Stress:   . Feeling of Stress : Not on file  Social Connections:   . Frequency of Communication with Friends and Family: Not on file  . Frequency of Social Gatherings with Friends and Family: Not on file  . Attends Religious Services: Not on file  . Active Member of Clubs or Organizations: Not on file  . Attends Archivist Meetings: Not on file  . Marital Status: Not on file    Allergies:  Allergies  Allergen Reactions  . Other Hives and Shortness Of Breath    Dogs and Cats.  . Dust Mite Extract   . Pollen Extract     Metabolic Disorder Labs: Lab Results  Component Value Date   HGBA1C 5.1 03/30/2018   MPG 99.67 03/30/2018   MPG 108 08/24/2016   Lab Results  Component Value Date   PROLACTIN 25.4 (H) 08/24/2016   Lab Results  Component Value Date   CHOL 221 (H) 03/30/2018   TRIG 194 (H) 03/30/2018   HDL 35 (L) 03/30/2018   CHOLHDL 6.3 03/30/2018   VLDL 39 03/30/2018   LDLCALC 147 (H) 03/30/2018   LDLCALC UNABLE TO CALCULATE IF TRIGLYCERIDE OVER 400 mg/dL 08/24/2016   Lab Results  Component Value Date   TSH 1.671 03/30/2018   TSH 4.188 08/24/2016    Therapeutic Level Labs: Lab Results   Component Value Date   LITHIUM <0.06 (L) 03/30/2018   LITHIUM 0.13 (L) 08/28/2016   No results found for: VALPROATE No components found for:  CBMZ  Current Medications: Current Outpatient Medications  Medication Sig Dispense Refill  . albuterol (PROVENTIL) (2.5 MG/3ML) 0.083% nebulizer solution Inhale into the lungs.    . Ascorbic Acid (VITAMIN C PO) Take 1 tablet by mouth daily.    . sertraline (ZOLOFT) 50 MG tablet Take  1 tablet (50 mg total) by mouth daily. 30 tablet 2   No current facility-administered medications for this visit.    Medication Side Effects: none  Orders placed this visit:  No orders of the defined types were placed in this encounter.   Psychiatric Specialty Exam:  Review of Systems  Blood pressure (!) 128/92, pulse 96, height 6\' 3"  (1.905 m).Body mass index is 35.62 kg/m.  General Appearance: Casual, Neat and Well Groomed  Eye Contact:  Good  Speech:  Clear and Coherent and Normal Rate  Volume:  Normal  Mood:  Depressed  Affect:  Appropriate and Congruent  Thought Process:  Coherent and Descriptions of Associations: Intact  Orientation:  Full (Time, Place, and Person)  Thought Content: Logical   Suicidal Thoughts:  No  Homicidal Thoughts:  No  Memory:  WNL  Judgement:  Good  Insight:  Good  Psychomotor Activity:  Normal  Concentration:  Concentration: Good  Recall:  Good  Fund of Knowledge: Good  Language: Good  Assets:  Communication Skills Desire for Improvement Financial Resources/Insurance Housing Intimacy Leisure Time Physical Health Resilience Social Support Talents/Skills Transportation Vocational/Educational  ADL's:  Intact  Cognition: WNL  Prognosis:  Good   Screenings:  AIMS     Admission (Discharged) from 03/28/2018 in [place] [facility] Admission (Discharged) from 08/22/2016 in [place] [facility]  AIMS Total Score 0 0    AUDIT     Admission (Discharged) from  03/28/2018 in [place] [facility] Admission (Discharged) from 08/22/2016 in [place] [facility] Admission (Discharged) from 06/13/2013 in [place] [facility]  Alcohol Use Disorder Identification Test Final Score (AUDIT) 18 16 22     PHQ2-9     Office Visit from 12/10/2017 in [place] Visit from 12/17/2016 in [place]  PHQ-2 Total Score 3 0  PHQ-9 Total Score 8 --      Receiving Psychotherapy: Yes   Treatment Plan/Recommendations:   Plan:  PDMP reviewed  1. Add Zoloft 50mg  - 1/2 tablet daily x 7 days, then one tablet daily. Advised to decrease alcohol use. Discussed potential seizure if mixing medication with alcohol. Patient plans to start a taper of alcohol before next visit.  Does not want to start other medications at this time.  RTC 4 weeks  Patient advised to contact office with any questions, adverse effects, or acute worsening in signs and symptoms.      Aloha Gell, NP

## 2020-01-07 ENCOUNTER — Telehealth: Payer: Self-pay | Admitting: Adult Health

## 2020-01-07 NOTE — Telephone Encounter (Signed)
Does he want to add a mood stabilizer back in?

## 2020-01-07 NOTE — Telephone Encounter (Signed)
Pt called and left a message about his zoloft. He has been taking it for 10 days now. He said that his anger is more subdued, However his thoughts are scattered and emotions are all over the place.he would like to take something different. He said in the message that he gave a verbal to leave a message with his mom if she answers the phone. The number he left was [phone]

## 2020-01-07 NOTE — Telephone Encounter (Signed)
Please review

## 2020-01-08 ENCOUNTER — Other Ambulatory Visit: Payer: Self-pay | Admitting: Adult Health

## 2020-01-08 DIAGNOSIS — F203 Undifferentiated schizophrenia: Secondary | ICD-10-CM

## 2020-01-08 DIAGNOSIS — F250 Schizoaffective disorder, bipolar type: Secondary | ICD-10-CM

## 2020-01-08 MED ORDER — PERPHENAZINE 2 MG PO TABS: 2.0000 mg | ORAL_TABLET | Freq: Two times a day (BID) | ORAL | 2 refills | Status: DC

## 2020-01-08 NOTE — Telephone Encounter (Signed)
Sent in Trilafon 2mg  BID to start.

## 2020-01-08 NOTE — Telephone Encounter (Signed)
Patient is notified. Will call back with any further concerns

## 2020-01-08 NOTE — Telephone Encounter (Signed)
Leon Mason called to report that the Zoloft is helping some but his thoughts are still unstable and kind of all over the place.  He is wondering if he could add Trilafon to his medication regimen.  Please call to discuss.

## 2020-01-08 NOTE — Telephone Encounter (Signed)
Patient asking for Trilafon?

## 2020-01-23 ENCOUNTER — Encounter: Payer: Self-pay | Admitting: Adult Health

## 2020-01-23 ENCOUNTER — Ambulatory Visit (INDEPENDENT_AMBULATORY_CARE_PROVIDER_SITE_OTHER): Payer: 59 | Admitting: Adult Health

## 2020-01-23 ENCOUNTER — Other Ambulatory Visit: Payer: Self-pay

## 2020-01-23 DIAGNOSIS — F411 Generalized anxiety disorder: Secondary | ICD-10-CM | POA: Diagnosis not present

## 2020-01-23 DIAGNOSIS — G4700 Insomnia, unspecified: Secondary | ICD-10-CM

## 2020-01-23 DIAGNOSIS — F203 Undifferentiated schizophrenia: Secondary | ICD-10-CM

## 2020-01-23 DIAGNOSIS — F331 Major depressive disorder, recurrent, moderate: Secondary | ICD-10-CM | POA: Diagnosis not present

## 2020-01-23 MED ORDER — SERTRALINE HCL 50 MG PO TABS: 50.0000 mg | ORAL_TABLET | Freq: Every day | ORAL | 2 refills | Status: DC

## 2020-01-23 MED ORDER — PERPHENAZINE 2 MG PO TABS: 2.0000 mg | ORAL_TABLET | Freq: Three times a day (TID) | ORAL | 2 refills | Status: DC

## 2020-01-23 NOTE — Progress Notes (Signed)
Leon Mason [id number] 1990/10/05 29 y.o.  Subjective:   Patient ID:  Leon Mason is a 29 y.o. (DOB 09-16-1990) male.  Chief Complaint: No chief complaint on file.   HPI Leon Mason presents to the office today for follow-up of MDD, GAD, insomnia, Schizophrenia.     Describes mood today as "improved". Pleasant. Mood symptoms - reports decreased depression, anxiety, and irritability. Stating "I'm able to control myself better". Feels "way" better. Stating "I'm always in a good mood now". Trying to stay positive. Doing better in work setting. Feels more organized. Able to keep up with things. Mind is "calmer". Feels like adding in the Perphenazine has made a big difference. Home life is "toxic".  Witnessed a suicide yesterday. Improved interest and motivation. Seeing Leon Mason for therapy.  Energy levels stable. Active, does not have a regular exercise routine. Enjoys some usual interests and activities. Single. Lives with mother (disabled),  2 cats and a dog. Spending time with family - Aunt and uncle local.  Appetite adequate. Weight loss 30 pounds - 285 pounds. Sleeps well most nights. Waking up earlier than he would like to. Reports nightmares. Averages 6 hours.  Focus and concentration stable. Completing tasks. Managing aspects of household. Works full time at [employer] x 1 and 1/2 months.  Denies SI or HI.  Denies AH or VH.   Previous medications: Perphenazine, Lithium, Sertraline, Haldol, Zyprexa, Risperdal, stopped due to various side effects, ie biting tongue a lot, restless legs, weight gain    PHQ2-9     Office Visit from 12/10/2017 in [place] Visit from 12/17/2016 in [place]  PHQ-2 Total Score 3 0  PHQ-9 Total Score 8 --       Review of Systems:  Review of Systems  Musculoskeletal: Negative for gait problem.  Neurological: Negative for tremors.  Psychiatric/Behavioral:       Please refer to  HPI    Medications: I have reviewed the patient's current medications.  Current Outpatient Medications  Medication Sig Dispense Refill  . albuterol (PROVENTIL) (2.5 MG/3ML) 0.083% nebulizer solution Inhale into the lungs.    . Ascorbic Acid (VITAMIN C PO) Take 1 tablet by mouth daily.    Marland Kitchen perphenazine (TRILAFON) 2 MG tablet Take 1 tablet (2 mg total) by mouth 3 (three) times daily. 90 tablet 2  . sertraline (ZOLOFT) 50 MG tablet Take 1 tablet (50 mg total) by mouth daily. 30 tablet 2   No current facility-administered medications for this visit.    Medication Side Effects: None  Allergies:  Allergies  Allergen Reactions  . Other Hives and Shortness Of Breath    Dogs and Cats.  . Dust Mite Extract   . Pollen Extract     Past Medical History:  Diagnosis Date  . Anxiety   . Asthma   . Bipolar 1 disorder ([place])   . Hallucinations   . Schizoaffective disorder ([place])   . Schizophrenia ([place])     Family History  Problem Relation Age of Onset  . Eczema Brother   . Post-traumatic stress disorder Brother   . COPD Maternal Aunt   . Bipolar disorder Mother   . Alcoholism Mother   . Diabetes Mother   . OCD Father   . Bipolar disorder Father   . Heart attack Maternal Grandmother   . Breast cancer Maternal Grandmother   . Cancer Paternal Grandmother   . Heart attack Paternal Grandfather   . Asthma Neg Hx   .  Allergic rhinitis Neg Hx   . Angioedema Neg Hx   . Urticaria Neg Hx   . Immunodeficiency Neg Hx   . Atopy Neg Hx   . Prostate cancer Neg Hx   . Colon cancer Neg Hx     Social History   Socioeconomic History  . Marital status: Single    Spouse name: Not on file  . Number of children: Not on file  . Years of education: Not on file  . Highest education level: Not on file  Occupational History  . Not on file  Tobacco Use  . Smoking status: Former Smoker    Packs/day: 0.00    Years: 0.50    Pack years: 0.00    Types: Cigarettes    Quit date: 05/28/2018     Years since quitting: 1.6  . Smokeless tobacco: Former Network engineer  . Vaping Use: Never used  Substance and Sexual Activity  . Alcohol use: Yes    Alcohol/week: 56.0 standard drinks    Types: 56 Cans of beer per week  . Drug use: Yes    Types: Marijuana  . Sexual activity: Not Currently  Other Topics Concern  . Not on file  Social History Narrative  . Not on file   Social Determinants of Health   Financial Resource Strain:   . Difficulty of Paying Living Expenses: Not on file  Food Insecurity:   . Worried About Charity fundraiser in the Last Year: Not on file  . Ran Out of Food in the Last Year: Not on file  Transportation Needs:   . Lack of Transportation (Medical): Not on file  . Lack of Transportation (Non-Medical): Not on file  Physical Activity:   . Days of Exercise per Week: Not on file  . Minutes of Exercise per Session: Not on file  Stress:   . Feeling of Stress : Not on file  Social Connections:   . Frequency of Communication with Friends and Family: Not on file  . Frequency of Social Gatherings with Friends and Family: Not on file  . Attends Religious Services: Not on file  . Active Member of Clubs or Organizations: Not on file  . Attends Archivist Meetings: Not on file  . Marital Status: Not on file  Intimate Partner Violence:   . Fear of Current or Ex-Partner: Not on file  . Emotionally Abused: Not on file  . Physically Abused: Not on file  . Sexually Abused: Not on file    Past Medical History, Surgical history, Social history, and Family history were reviewed and updated as appropriate.   Please see review of systems for further details on the patient's review from today.   Objective:   Physical Exam:  There were no vitals taken for this visit.  Physical Exam Constitutional:      General: He is not in acute distress. Musculoskeletal:        General: No deformity.  Neurological:     Mental Status: He is alert and oriented to  person, place, and time.     Coordination: Coordination normal.  Psychiatric:        Attention and Perception: Attention and perception normal. He does not perceive auditory or visual hallucinations.        Mood and Affect: Mood normal. Mood is not anxious or depressed. Affect is not labile, blunt, angry or inappropriate.        Speech: Speech normal.  Behavior: Behavior normal.        Thought Content: Thought content normal. Thought content is not paranoid or delusional. Thought content does not include homicidal or suicidal ideation. Thought content does not include homicidal or suicidal plan.        Cognition and Memory: Cognition and memory normal.        Judgment: Judgment normal.     Comments: Insight intact     Lab Review:     Component Value Date/Time   NA 139 02/19/2019 0602   K 4.2 02/19/2019 0602   CL 105 02/19/2019 0602   CO2 23 02/19/2019 0602   GLUCOSE 124 (H) 02/19/2019 0602   BUN 11 02/19/2019 0602   CREATININE 1.06 02/19/2019 0602   CALCIUM 9.7 02/19/2019 0602   PROT 7.1 02/18/2019 0412   ALBUMIN 4.0 02/18/2019 0412   AST 40 02/18/2019 0412   ALT 65 (H) 02/18/2019 0412   ALKPHOS 55 02/18/2019 0412   BILITOT 1.0 02/18/2019 0412   GFRNONAA >60 02/19/2019 0602   GFRAA >60 02/19/2019 0602       Component Value Date/Time   WBC 8.6 02/19/2019 0602   RBC 4.78 02/19/2019 0602   HGB 15.0 02/19/2019 0602   HCT 44.7 02/19/2019 0602   PLT 191 02/19/2019 0602   MCV 93.5 02/19/2019 0602   MCH 31.4 02/19/2019 0602   MCHC 33.6 02/19/2019 0602   RDW 13.2 02/19/2019 0602   LYMPHSABS 1.0 02/19/2019 0602   MONOABS 0.4 02/19/2019 0602   EOSABS 0.0 02/19/2019 0602   BASOSABS 0.0 02/19/2019 0602    Lithium Lvl  Date Value Ref Range Status  03/30/2018 <0.06 (L) 0.60 - 1.20 mmol/L Final    Comment:    REPEATED TO VERIFY Performed at [facility], [place] [address], [place], [place] [address]      No results found for: PHENYTOIN, PHENOBARB,  VALPROATE, CBMZ   .res Assessment: Plan:    Plan:  PDMP reviewed  1. Continue Zoloft 50mg   2. Increase Perphenazine 2mg  BID to 4mg  at hs and 2mg  in the mornigDoes not want to start other medications at this time.  RTC 4 weeks  Patient advised to contact office with any questions, adverse effects, or acute worsening in signs and symptoms.   Diagnoses and all orders for this visit:  Generalized anxiety disorder -     sertraline (ZOLOFT) 50 MG tablet; Take 1 tablet (50 mg total) by mouth daily.  Insomnia, unspecified type -     sertraline (ZOLOFT) 50 MG tablet; Take 1 tablet (50 mg total) by mouth daily.  Undifferentiated schizophrenia (HCC) -     sertraline (ZOLOFT) 50 MG tablet; Take 1 tablet (50 mg total) by mouth daily. -     perphenazine (TRILAFON) 2 MG tablet; Take 1 tablet (2 mg total) by mouth 3 (three) times daily.  Major depressive disorder, recurrent episode, moderate (HCC) -     sertraline (ZOLOFT) 50 MG tablet; Take 1 tablet (50 mg total) by mouth daily.     Please see After Visit Summary for patient specific instructions.  Future Appointments  Date Time Provider [place]  02/26/2020  1:15 PM [name], DO [facility] None    No orders of the defined types were placed in this encounter.   -------------------------------

## 2020-01-26 ENCOUNTER — Encounter: Payer: Self-pay | Admitting: Family Medicine

## 2020-02-12 ENCOUNTER — Ambulatory Visit
Admission: EM | Admit: 2020-02-12 | Discharge: 2020-02-12 | Disposition: A | Discharge status: Home / Self Care | Payer: 59 | Attending: Internal Medicine | Admitting: Internal Medicine

## 2020-02-12 DIAGNOSIS — N489 Disorder of penis, unspecified: Secondary | ICD-10-CM

## 2020-02-12 DIAGNOSIS — Z760 Encounter for issue of repeat prescription: Secondary | ICD-10-CM

## 2020-02-12 MED ORDER — ALBUTEROL SULFATE (2.5 MG/3ML) 0.083% IN NEBU: 2.5000 mg | INHALATION_SOLUTION | Freq: Four times a day (QID) | RESPIRATORY_TRACT | 1 refills | Status: DC | PRN

## 2020-02-12 MED ORDER — ALBUTEROL SULFATE HFA 108 (90 BASE) MCG/ACT IN AERS
2.0000 | INHALATION_SPRAY | Freq: Once | RESPIRATORY_TRACT | Status: AC
  Administered 2020-02-12: 2 via RESPIRATORY_TRACT

## 2020-02-12 NOTE — ED Provider Notes (Signed)
[facility]    CSN: [id number] Arrival date & time: 02/12/20  1316      History   Chief Complaint Chief Complaint  Patient presents with  . Medication Refill    HPI Leon Mason is a 29 y.o. male comes to the urgent care for albuterol refill.  Patient also has concerns about a lesion on his penis.  He is worried that the lesion may be genital warts.  Lesion has been present for the past 4 to 5 years.  Size in the same.  No change in shape or size.  Is not painful.Marland Kitchen   HPI  Past Medical History:  Diagnosis Date  . Anxiety   . Asthma   . Bipolar 1 disorder ([place])   . Hallucinations   . Schizoaffective disorder ([place])   . Schizophrenia [facility])     Patient Active Problem List   Diagnosis Date Noted  . Mild intermittent asthma with (acute) exacerbation 02/18/2019  . Hypokalemia 02/18/2019  . Normocytic anemia 02/18/2019  . Elevated transaminase level 02/18/2019  . Prolonged QT interval 02/18/2019  . Viral syndrome 02/18/2019  . SIRS (systemic inflammatory response syndrome) ([place]) 02/18/2019  . Globus sensation 07/04/2018  . Schizophrenia ([place]) 03/28/2018  . MDD (major depressive disorder), recurrent episode, severe ([place]) 03/28/2018  . Schizo-affective schizophrenia, chronic condition with acute exacerbation ([place]) 03/28/2018  . Tobacco use disorder 12/17/2016  . Schizoaffective disorder, bipolar type ([place]) 08/23/2016  . Alcohol use disorder, severe, dependence ([place]) 08/23/2016  . Allergic rhinitis due to pollen 10/09/2015  . 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 Mild persistent allergic asthma 10/09/2015  . Psychosis ([place]) 06/14/2013    Past Surgical History:  Procedure Laterality Date  . [place] EXTRACTION  2009   all four       Home Medications    Prior to Admission medications   Medication Sig Start Date End Date Taking? Authorizing Provider  albuterol (PROVENTIL) (2.5 MG/3ML) 0.083% nebulizer solution Inhale 3 mLs (2.5 mg total) into the  lungs every 6 (six) hours as needed for wheezing or shortness of breath. 02/12/20   LampteyMyrene Galas, MD  Ascorbic Acid (VITAMIN C PO) Take 1 tablet by mouth daily.    [provider]  perphenazine (TRILAFON) 2 MG tablet Take 1 tablet (2 mg total) by mouth 3 (three) times daily. 01/23/20   Mozingo, Berdie Ogren, NP  sertraline (ZOLOFT) 50 MG tablet Take 1 tablet (50 mg total) by mouth daily. 01/23/20   Mozingo, Berdie Ogren, NP  mometasone-formoterol (DULERA) 100-5 MCG/ACT AERO Inhale 2 puffs into the lungs 2 (two) times daily. For shortness of breath Patient not taking: Reported on 02/17/2019 07/04/18 02/17/19  [name], DO    Family History Family History  Problem Relation Age of Onset  . Eczema Brother   . Post-traumatic stress disorder Brother   . COPD Maternal Aunt   . Bipolar disorder Mother   . Alcoholism Mother   . Diabetes Mother   . OCD Father   . Bipolar disorder Father   . Heart attack Maternal Grandmother   . Breast cancer Maternal Grandmother   . Cancer Paternal Grandmother   . Heart attack Paternal Grandfather   . Asthma Neg Hx   . Allergic rhinitis Neg Hx   . Angioedema Neg Hx   . Urticaria Neg Hx   . Immunodeficiency Neg Hx   . Atopy Neg Hx   . Prostate cancer Neg Hx   . Colon cancer Neg Hx  Social History Social History   Tobacco Use  . Smoking status: Former Smoker    Packs/day: 0.00    Years: 0.50    Pack years: 0.00    Types: Cigarettes    Quit date: 05/28/2018    Years since quitting: 1.7  . Smokeless tobacco: Former Network engineer  . Vaping Use: Never used  Substance Use Topics  . Alcohol use: Yes    Alcohol/week: 56.0 standard drinks    Types: 56 Cans of beer per week  . Drug use: Yes    Types: Marijuana     Allergies   Other, Dust mite extract, and Pollen extract   Review of Systems Review of Systems  HENT: Negative for congestion and dental problem.   Respiratory: Negative for cough, shortness of  breath and wheezing.      Physical Exam Triage Vital Signs ED Triage Vitals  Enc Vitals Group     BP 02/12/20 1408 129/86     Pulse Rate 02/12/20 1408 78     Resp 02/12/20 1408 20     Temp 02/12/20 1408 98 F (36.7 C)     Temp src --      SpO2 02/12/20 1408 95 %     Weight --      Height --      Head Circumference --      Peak Flow --      Pain Score 02/12/20 1405 0     Pain Loc --      Pain Edu? --      Excl. in [place]? --    No data found.  Updated Vital Signs BP 129/86   Pulse 78   Temp 98 F (36.7 C)   Resp 20   SpO2 95%   Visual Acuity Right Eye Distance:   Left Eye Distance:   Bilateral Distance:    Right Eye Near:   Left Eye Near:    Bilateral Near:     Physical Exam Vitals and nursing note reviewed.  Constitutional:      General: He is not in acute distress.    Appearance: He is not ill-appearing.  Cardiovascular:     Rate and Rhythm: Normal rate and regular rhythm.     Pulses: Normal pulses.     Heart sounds: Normal heart sounds.  Pulmonary:     Effort: Pulmonary effort is normal.     Breath sounds: Normal breath sounds.  Genitourinary:    Penis: Normal.      Comments: Small nontender nodule noticed on the shaft of the penis.  Measures about 2 mm. Neurological:     Mental Status: He is alert.      UC Treatments / Results  Labs (all labs ordered are listed, but only abnormal results are displayed) Labs Reviewed - No data to display  EKG   Radiology No results found.  Procedures Procedures (including critical care time)  Medications Ordered in UC Medications  albuterol (VENTOLIN HFA) 108 (90 Base) MCG/ACT inhaler 2 puff (2 puffs Inhalation Given 02/12/20 1444)    Initial Impression / Assessment and Plan / UC Course  I have reviewed the triage vital signs and the nursing notes.  Pertinent labs & imaging results that were available during my care of the patient were reviewed by me and considered in my medical decision making (see  chart for details).    1.  Medication refill: Albuterol refilled Albuterol nebulization solutions refilled as well  2.  Benign penile  lesion Patient is advised to keep an eye on the lesion.  If he notices any changes in the lesion he is advised to return to urgent care to be reevaluated.  The characteristics of the lesion is not consistent with genital warts.   Final Clinical Impressions(s) / UC Diagnoses   Final diagnoses:  Encounter for medication refill  Penile lesion   Discharge Instructions   None    ED Prescriptions    Medication Sig Dispense Auth. Provider   albuterol (PROVENTIL) (2.5 MG/3ML) 0.083% nebulizer solution Inhale 3 mLs (2.5 mg total) into the lungs every 6 (six) hours as needed for wheezing or shortness of breath. 75 mL Mack Alvidrez, Myrene Galas, MD     PDMP not reviewed this encounter.   Chase Picket, MD 02/13/20 [phone]

## 2020-02-12 NOTE — ED Triage Notes (Signed)
Pt needs refill for albuterol inhaler

## 2020-02-20 ENCOUNTER — Ambulatory Visit: Payer: 59 | Admitting: Adult Health

## 2020-02-26 ENCOUNTER — Ambulatory Visit: Payer: Medicaid Other | Admitting: Family Medicine

## 2020-03-10 ENCOUNTER — Ambulatory Visit: Payer: 59 | Admitting: Psychiatry

## 2020-03-10 NOTE — Progress Notes (Deleted)
Psychotherapy Progress Note [facility] [name], PhD LP  Patient ID: Leon Mason     MRN: [id number] Therapy format: {Therapy Types:21967::"Individual psychotherapy"} Date: 03/10/2020      Start: ***:***     Stop: ***:***     Time Spent: *** min Location: {SvcLoc:22530::"In-person"}   Session narrative (presenting needs, interim history, self-report of stressors and symptoms, applications of prior therapy, status changes, and interventions made in session) ***  Therapeutic modalities: {AM:23362::"Cognitive Behavioral Therapy","Solution-Oriented/Positive Psychology"}  Mental Status/Observations:  Appearance:   {PSY:22683}     Behavior:  {PSY:21022743}  Motor:  {PSY:22302}  Speech/Language:   {PSY:22685}  Affect:  {PSY:22687}  Mood:  {PSY:31886}  Thought process:  {PSY:31888}  Thought content:    {PSY:[phone]}  Sensory/Perceptual disturbances:    {PSY:438-867-6546}  Orientation:  {Psych Orientation:23301::"Fully oriented"}  Attention:  {Good-Fair-Poor ratings:23770::"Good"}    Concentration:  {Good-Fair-Poor ratings:23770::"Good"}  Memory:  {PSY:989-055-7512}  Insight:    {Good-Fair-Poor ratings:23770::"Good"}  Judgment:   {Good-Fair-Poor ratings:23770::"Good"}  Impulse Control:  {Good-Fair-Poor ratings:23770::"Good"}   Risk Assessment: Danger to Self: {Risk:22599::"No"} Self-injurious Behavior: {Risk:22599::"No"} Danger to Others: {Risk:22599::"No"} Physical Aggression / Violence: {Risk:22599::"No"} Duty to Warn: {AMYesNo:22526::"No"} Access to Firearms a concern: {AMYesNo:22526::"No"}  Assessment of progress:  {Progress:22147::"progressing"}  Diagnosis: No diagnosis found. Plan:  . *** . Other recommendations/advice as may be noted above . Continue to utilize previously learned skills ad lib . Maintain medication as prescribed and work faithfully with relevant prescriber(s) if any changes are desired or seem indicated . Call the clinic  on-call service, present to ER, or call 911 if any life-threatening psychiatric crisis . No follow-ups on file. . Already scheduled visit in this office Visit date not found.  Robley Fries, PhD [name], PhD LP Clinical Psychologist, [facility] [address], [address] [place], [place] [address] [phone]

## 2020-04-10 ENCOUNTER — Other Ambulatory Visit: Payer: Self-pay

## 2020-04-10 ENCOUNTER — Ambulatory Visit (INDEPENDENT_AMBULATORY_CARE_PROVIDER_SITE_OTHER): Payer: 59 | Admitting: Psychiatry

## 2020-04-10 DIAGNOSIS — F203 Undifferentiated schizophrenia: Secondary | ICD-10-CM

## 2020-04-10 DIAGNOSIS — F411 Generalized anxiety disorder: Secondary | ICD-10-CM | POA: Diagnosis not present

## 2020-04-10 DIAGNOSIS — F1020 Alcohol dependence, uncomplicated: Secondary | ICD-10-CM
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
## 2020-05-26 ENCOUNTER — Other Ambulatory Visit: Payer: Self-pay | Admitting: Family Medicine

## 2020-06-24 ENCOUNTER — Ambulatory Visit (INDEPENDENT_AMBULATORY_CARE_PROVIDER_SITE_OTHER): Payer: 59

## 2020-06-24 ENCOUNTER — Ambulatory Visit
Admission: EM | Admit: 2020-06-24 | Discharge: 2020-06-24 | Disposition: A | Discharge status: Home / Self Care | Payer: 59 | Attending: Family Medicine | Admitting: Family Medicine

## 2020-06-24 ENCOUNTER — Other Ambulatory Visit: Payer: Self-pay

## 2020-06-24 ENCOUNTER — Encounter: Payer: Self-pay | Admitting: Emergency Medicine

## 2020-06-24 ENCOUNTER — Telehealth: Payer: Self-pay | Admitting: Family Medicine

## 2020-06-24 DIAGNOSIS — J209 Acute bronchitis, unspecified: Secondary | ICD-10-CM | POA: Diagnosis not present

## 2020-06-24 DIAGNOSIS — J189 Pneumonia, unspecified organism: Secondary | ICD-10-CM | POA: Diagnosis not present

## 2020-06-24 DIAGNOSIS — R059 Cough, unspecified: Secondary | ICD-10-CM

## 2020-06-24 DIAGNOSIS — J069 Acute upper respiratory infection, unspecified: Secondary | ICD-10-CM

## 2020-06-24 DIAGNOSIS — R509 Fever, unspecified: Secondary | ICD-10-CM | POA: Diagnosis not present

## 2020-06-24 DIAGNOSIS — R5383 Other fatigue: Secondary | ICD-10-CM | POA: Diagnosis not present

## 2020-06-24 MED ORDER — DEXAMETHASONE SODIUM PHOSPHATE 10 MG/ML IJ SOLN
10.0000 mg | Freq: Once | INTRAMUSCULAR | Status: AC
  Administered 2020-06-24: 10 mg via INTRAMUSCULAR

## 2020-06-24 MED ORDER — PREDNISONE 10 MG (21) PO TBPK: ORAL_TABLET | Freq: Every day | ORAL | 0 refills | Status: AC

## 2020-06-24 MED ORDER — AMOXICILLIN-POT CLAVULANATE 875-125 MG PO TABS: 1.0000 | ORAL_TABLET | Freq: Two times a day (BID) | ORAL | 0 refills | Status: AC

## 2020-06-24 MED ORDER — ALBUTEROL SULFATE HFA 108 (90 BASE) MCG/ACT IN AERS
2.0000 | INHALATION_SPRAY | Freq: Once | RESPIRATORY_TRACT | Status: AC
  Administered 2020-06-24: 2 via RESPIRATORY_TRACT

## 2020-06-24 MED ORDER — GUAIFENESIN-CODEINE 100-10 MG/5ML PO SYRP: 5.0000 mL | ORAL_SOLUTION | Freq: Three times a day (TID) | ORAL | 0 refills | Status: DC | PRN

## 2020-06-24 NOTE — ED Triage Notes (Signed)
Fever, body pain, coughing up brown.

## 2020-06-24 NOTE — Discharge Instructions (Addendum)
I will call you with xray results if there is pneumonia present  You have received a steroid injection in the office today  I have sent in a prednisone taper for you to take for 6 days. 6 tablets on day one, 5 tablets on day two, 4 tablets on day three, 3 tablets on day four, 2 tablets on day five, and 1 tablet on day six.  You have received albuterol inhaler in the office today as well. May use this every 4 hours as needed for cough, wheezing, shortness of breath  Your COVID and Influenza tests are pending.  You should self quarantine until the test results are back.    Take Tylenol or ibuprofen as needed for fever or discomfort.  Rest and keep yourself hydrated.    Follow-up with your primary care provider if your symptoms are not improving.

## 2020-06-24 NOTE — ED Provider Notes (Signed)
[name]   [id number] 06/24/20 Arrival Time: 1118   CC: COVID symptoms  SUBJECTIVE: History from: patient.  Leon Mason is a 30 y.o. male who presents with productive cough, body aches, headache, SOB, fever since yesterday. Denies sick exposure to COVID, flu or strep. Denies recent travel. Has negative history of Covid. Has completed Covid vaccines and booster. Has not taken OTC medications for this. Reports that he has had to use his nebulizer x 5 in the last 24 hours. Has hx asthma, schizoaffective disorder. Has been hospitalized in the past for asthma exacerbation, but never intubated. Cough and fatigue are worse with activity. Denies previous symptoms in the past. Denies sinus pain, rhinorrhea, sore throat, chest pain, nausea, changes in bowel or bladder habits.    ROS: As per HPI.  All other pertinent ROS negative.     Past Medical History:  Diagnosis Date  . Anxiety   . Asthma   . Bipolar 1 disorder ([place])   . Hallucinations   . Schizoaffective disorder ([place])   . Schizophrenia [facility])    Past Surgical History:  Procedure Laterality Date  . WISDOM TOOTH EXTRACTION  2009   all four   Allergies  Allergen Reactions  . Other Hives and Shortness Of Breath    Dogs and Cats.  . Dust Mite Extract   . Pollen Extract    No current facility-administered medications on file prior to encounter.   Current Outpatient Medications on File Prior to Encounter  Medication Sig Dispense Refill  . albuterol (PROVENTIL) (2.5 MG/3ML) 0.083% nebulizer solution Inhale 3 mLs (2.5 mg total) into the lungs every 6 (six) hours as needed for wheezing or shortness of breath. 75 mL 1  . Ascorbic Acid (VITAMIN C PO) Take 1 tablet by mouth daily.    Marland Kitchen perphenazine (TRILAFON) 2 MG tablet Take 1 tablet (2 mg total) by mouth 3 (three) times daily. 90 tablet 2  . sertraline (ZOLOFT) 50 MG tablet Take 1 tablet (50 mg total) by mouth daily. 30 tablet 2  . [DISCONTINUED] mometasone-formoterol  (DULERA) 100-5 MCG/ACT AERO Inhale 2 puffs into the lungs 2 (two) times daily. For shortness of breath (Patient not taking: Reported on 02/17/2019) 1 Inhaler 3   Social History   Socioeconomic History  . Marital status: Single    Spouse name: Not on file  . Number of children: Not on file  . Years of education: Not on file  . Highest education level: Not on file  Occupational History  . Not on file  Tobacco Use  . Smoking status: Current Every Day Smoker    Packs/day: 0.50    Years: 0.50    Pack years: 0.25    Types: Cigarettes    Last attempt to quit: 05/28/2018    Years since quitting: 2.0  . Smokeless tobacco: Former Network engineer  . Vaping Use: Never used  Substance and Sexual Activity  . Alcohol use: Yes    Alcohol/week: 56.0 standard drinks    Types: 56 Cans of beer per week  . Drug use: Yes    Types: Marijuana  . Sexual activity: Not Currently  Other Topics Concern  . Not on file  Social History Narrative  . Not on file   Social Determinants of Health   Financial Resource Strain: Not on file  Food Insecurity: Not on file  Transportation Needs: Not on file  Physical Activity: Not on file  Stress: Not on file  Social Connections: Not on  file  Intimate Partner Violence: Not on file   Family History  Problem Relation Age of Onset  . Eczema Brother   . Post-traumatic stress disorder Brother   . COPD Maternal Aunt   . Bipolar disorder Mother   . Alcoholism Mother   . Diabetes Mother   . OCD Father   . Bipolar disorder Father   . Heart attack Maternal Grandmother   . Breast cancer Maternal Grandmother   . Cancer Paternal Grandmother   . Heart attack Paternal Grandfather   . Asthma Neg Hx   . Allergic rhinitis Neg Hx   . Angioedema Neg Hx   . Urticaria Neg Hx   . Immunodeficiency Neg Hx   . Atopy Neg Hx   . Prostate cancer Neg Hx   . Colon cancer Neg Hx     OBJECTIVE:  Vitals:   06/24/20 1204 06/24/20 1216  BP:  (!) 157/99  Pulse:  (!) 104   Resp:  19  Temp:  99.2 F (37.3 C)  TempSrc: Oral Oral  SpO2:  93%     General appearance: alert; appears fatigued, but nontoxic; speaking in full sentences and tolerating own secretions HEENT: NCAT; Ears: EACs clear, TMs pearly gray; Eyes: PERRL.  EOM grossly intact. Sinuses: nontender; Nose: nares patent with clear rhinorrhea, Throat: oropharynx erythematous, cobblestoning present, tonsils non erythematous or enlarged, uvula midline  Neck: supple without LAD Lungs: unlabored respirations, symmetrical air entry; cough: moderate; no respiratory distress; Diffuse wheezing to bilateral lung fields, rhonchi to bilateral lung fields Heart: regular rate and rhythm.  Radial pulses 2+ symmetrical bilaterally Skin: warm and dry Psychological: alert and cooperative; normal mood and affect  LABS:  No results found for this or any previous visit (from the past 24 hour(s)).   ASSESSMENT & PLAN:  1. Community acquired pneumonia of right lower lobe of lung   2. URI with cough and congestion   3. Acute bronchitis, unspecified organism     Meds ordered this encounter  Medications  . dexamethasone (DECADRON) injection 10 mg  . predniSONE (STERAPRED UNI-PAK 21 TAB) 10 MG (21) TBPK tablet    Sig: Take by mouth daily for 6 days. Take 6 tablets on day 1, 5 tablets on day 2, 4 tablets on day 3, 3 tablets on day 4, 2 tablets on day 5, 1 tablet on day 6    Dispense:  21 tablet    Refill:  0    Order Specific Question:   Supervising Provider    Answer:   Chase Picket [id number]  . albuterol (VENTOLIN HFA) 108 (90 Base) MCG/ACT inhaler 2 puff  . guaiFENesin-codeine (ROBITUSSIN AC) 100-10 MG/5ML syrup    Sig: Take 5 mLs by mouth 3 (three) times daily as needed for cough.    Dispense:  120 mL    Refill:  0    Order Specific Question:   Supervising Provider    Answer:   Chase Picket [id number]  . amoxicillin-clavulanate (AUGMENTIN) 875-125 MG tablet    Sig: Take 1 tablet by mouth 2 (two)  times daily for 10 days.    Dispense:  20 tablet    Refill:  0    Order Specific Question:   Supervising Provider    Answer:   Chase Picket [id number]   Decadron 10mg  IM in office today Albuterol inhaler 2 puffs in office today Prescribed Augmentin Prescribed Cheratussin Xray today shows R lower lobe pneumonia Continue supportive care at home COVID and flu testing  ordered.  It will take between 2-3 days for test results. Someone will contact you regarding abnormal results.   Work note provided Patient should remain in quarantine until they have received Covid results.  If negative you may resume normal activities (go back to work/school) while practicing hand hygiene, social distance, and mask wearing.  If positive, patient should remain in quarantine for at least 5 days from symptom onset AND greater than 72 hours after symptoms resolution (absence of fever without the use of fever-reducing medication and improvement in respiratory symptoms), whichever is longer Get plenty of rest and push fluids Use OTC zyrtec for nasal congestion, runny nose, and/or sore throat Use OTC flonase for nasal congestion and runny nose Use medications daily for symptom relief Use OTC medications like ibuprofen or tylenol as needed fever or pain Call or go to the ED if you have any new or worsening symptoms such as fever, worsening cough, shortness of breath, chest tightness, chest pain, turning blue, changes in mental status.  Reviewed expectations re: course of current medical issues. Questions answered. Outlined signs and symptoms indicating need for more acute intervention. Patient verbalized understanding. After Visit Summary given.         Faustino Congress, NP 06/24/20 1652

## 2020-06-24 NOTE — Telephone Encounter (Signed)
Chest xray showed possible pneumonia in the R lower lobe. Augmentin 875mg  BID prescribed x 10 days. Attempted to call patient with information, no answer, no vm.

## 2020-06-25 LAB — COVID-19, FLU A+B NAA
Influenza A, NAA: NOT DETECTED
Influenza B, NAA: NOT DETECTED
SARS-CoV-2, NAA: NOT DETECTED

## 2020-08-02 ENCOUNTER — Other Ambulatory Visit: Payer: Self-pay

## 2020-08-02 ENCOUNTER — Ambulatory Visit (INDEPENDENT_AMBULATORY_CARE_PROVIDER_SITE_OTHER): Payer: 59

## 2020-08-02 ENCOUNTER — Encounter: Payer: Self-pay | Admitting: Emergency Medicine

## 2020-08-02 ENCOUNTER — Ambulatory Visit
Admission: EM | Admit: 2020-08-02 | Discharge: 2020-08-02 | Disposition: A | Discharge status: Home / Self Care | Payer: 59 | Attending: Family Medicine | Admitting: Family Medicine

## 2020-08-02 DIAGNOSIS — M7989 Other specified soft tissue disorders: Secondary | ICD-10-CM

## 2020-08-02 DIAGNOSIS — S63295A Dislocation of distal interphalangeal joint of left ring finger, initial encounter: Secondary | ICD-10-CM | POA: Diagnosis not present

## 2020-08-02 MED ORDER — PREDNISONE 10 MG (21) PO TBPK: ORAL_TABLET | Freq: Every day | ORAL | 0 refills | Status: AC

## 2020-08-02 NOTE — Discharge Instructions (Signed)
I have sent in a prednisone taper for you to take for 6 days. 6 tablets on day one, 5 tablets on day two, 4 tablets on day three, 3 tablets on day four, 2 tablets on day five, and 1 tablet on day six.  Follow up with this office or with primary care if symptoms are persisting.  Follow up in the ER for high fever, trouble swallowing, trouble breathing, other concerning symptoms.

## 2020-08-02 NOTE — ED Provider Notes (Signed)
[facility]    CSN: [id number] Arrival date & time: 08/02/20  1040      History   Chief Complaint No chief complaint on file.   HPI Leon Mason is a 30 y.o. male.   Reports left ring finger swollen at PIP joint.  Denies known injury, reports that he woke up with it swollen and very painful.  States that he has tried splinting his finger with little relief.  He is concerned that it might be broken.  Also states left knee pain around the same time that his left ring finger flared up.  States that he drinks 60 amounts of alcohol (beer) every day.  Reports that this has decreased significantly and he is trying to give up alcohol.  Also ports he is recently diagnosed with ankylosing spondylitis and he is wondering if this has anything to do with his symptoms.  Denies previous symptoms.  Denies cracking or popping within the joint, denies erythema, ecchymosis, drainage from any of the areas, radiating pain, numbness, tingling, other symptoms.  ROS per HPI    The history is provided by the patient.    Past Medical History:  Diagnosis Date  . Anxiety   . Asthma   . Bipolar 1 disorder ([place])   . Hallucinations   . Schizoaffective disorder ([place])   . Schizophrenia [facility])     Patient Active Problem List   Diagnosis Date Noted  . Mild intermittent asthma with (acute) exacerbation 02/18/2019  . Hypokalemia 02/18/2019  . Normocytic anemia 02/18/2019  . Elevated transaminase level 02/18/2019  . Prolonged QT interval 02/18/2019  . Viral syndrome 02/18/2019  . SIRS (systemic inflammatory response syndrome) ([place]) 02/18/2019  . Globus sensation 07/04/2018  . Schizophrenia ([place]) 03/28/2018  . MDD (major depressive disorder), recurrent episode, severe ([place]) 03/28/2018  . Schizo-affective schizophrenia, chronic condition with acute exacerbation ([place]) 03/28/2018  . Tobacco use disorder 12/17/2016  . Schizoaffective disorder, bipolar type ([place]) 08/23/2016  . Alcohol use  disorder, severe, dependence ([place]) 08/23/2016  . Allergic rhinitis due to pollen 10/09/2015  . 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 Mild persistent allergic asthma 10/09/2015  . Psychosis ([place]) 06/14/2013    Past Surgical History:  Procedure Laterality Date  . [place] EXTRACTION  2009   all four       Home Medications    Prior to Admission medications   Medication Sig Start Date End Date Taking? Authorizing Provider  predniSONE (STERAPRED UNI-PAK 21 TAB) 10 MG (21) TBPK tablet Take by mouth daily for 6 days. Take 6 tablets on day 1, 5 tablets on day 2, 4 tablets on day 3, 3 tablets on day 4, 2 tablets on day 5, 1 tablet on day 6 08/02/20 08/08/20 Yes Faustino Congress, NP  albuterol (PROVENTIL) (2.5 MG/3ML) 0.083% nebulizer solution Inhale 3 mLs (2.5 mg total) into the lungs every 6 (six) hours as needed for wheezing or shortness of breath. 02/12/20   LampteyMyrene Galas, MD  Ascorbic Acid (VITAMIN C PO) Take 1 tablet by mouth daily.    [provider]  guaiFENesin-codeine (ROBITUSSIN AC) 100-10 MG/5ML syrup Take 5 mLs by mouth 3 (three) times daily as needed for cough. 06/24/20   Faustino Congress, NP  perphenazine (TRILAFON) 2 MG tablet Take 1 tablet (2 mg total) by mouth 3 (three) times daily. 01/23/20   Mozingo, Berdie Ogren, NP  sertraline (ZOLOFT) 50 MG tablet Take 1 tablet (50 mg total) by mouth daily. 01/23/20   Mozingo, Berdie Ogren, NP  mometasone-formoterol (DULERA) 100-5 MCG/ACT AERO Inhale 2 puffs into the lungs 2 (two) times daily. For shortness of breath Patient not taking: Reported on 02/17/2019 07/04/18 02/17/19  [name], DO    Family History Family History  Problem Relation Age of Onset  . Eczema Brother   . Post-traumatic stress disorder Brother   . COPD Maternal Aunt   . Bipolar disorder Mother   . Alcoholism Mother   . Diabetes Mother   . OCD Father   . Bipolar disorder Father   . Heart attack Maternal Grandmother   . Breast  cancer Maternal Grandmother   . Cancer Paternal Grandmother   . Heart attack Paternal Grandfather   . Asthma Neg Hx   . Allergic rhinitis Neg Hx   . Angioedema Neg Hx   . Urticaria Neg Hx   . Immunodeficiency Neg Hx   . Atopy Neg Hx   . Prostate cancer Neg Hx   . Colon cancer Neg Hx     Social History Social History   Tobacco Use  . Smoking status: Current Every Day Smoker    Packs/day: 0.50    Years: 0.50    Pack years: 0.25    Types: Cigarettes    Last attempt to quit: 05/28/2018    Years since quitting: 2.1  . Smokeless tobacco: Former Network engineer  . Vaping Use: Never used  Substance Use Topics  . Alcohol use: Yes    Alcohol/week: 56.0 standard drinks    Types: 56 Cans of beer per week  . Drug use: Yes    Types: Marijuana     Allergies   Other, Dust mite extract, and Pollen extract   Review of Systems Review of Systems   Physical Exam Triage Vital Signs ED Triage Vitals  Enc Vitals Group     BP 08/02/20 1132 (!) 149/96     Pulse Rate 08/02/20 1132 88     Resp 08/02/20 1132 18     Temp 08/02/20 1132 98.2 F (36.8 C)     Temp Source 08/02/20 1132 Oral     SpO2 08/02/20 1132 97 %     Weight --      Height --      Head Circumference --      Peak Flow --      Pain Score 08/02/20 1134 5     Pain Loc --      Pain Edu? --      Excl. in [place]? --    No data found.  Updated Vital Signs BP (!) 149/96 (BP Location: Right Arm)   Pulse 88   Temp 98.2 F (36.8 C) (Oral)   Resp 18   SpO2 97%   Visual Acuity Right Eye Distance:   Left Eye Distance:   Bilateral Distance:    Right Eye Near:   Left Eye Near:    Bilateral Near:     Physical Exam Vitals and nursing note reviewed.  Constitutional:      General: He is not in acute distress.    Appearance: Normal appearance. He is well-developed. He is not ill-appearing.  HENT:     Head: Normocephalic and atraumatic.     Nose: Nose normal.     Mouth/Throat:     Mouth: Mucous membranes are moist.      Pharynx: Oropharynx is clear.  Eyes:     Extraocular Movements: Extraocular movements intact.     Conjunctiva/sclera: Conjunctivae normal.     Pupils: Pupils  are equal, round, and reactive to light.  Cardiovascular:     Rate and Rhythm: Normal rate and regular rhythm.  Pulmonary:     Effort: Pulmonary effort is normal. No respiratory distress.  Musculoskeletal:        General: Swelling and tenderness present.     Cervical back: Normal range of motion and neck supple.     Comments: Swelling, tenderness, warmth noted to PIP joint of left ring finger  Tenderness and warmth to lateral aspect of anterior left knee as well  Skin:    General: Skin is warm and dry.     Capillary Refill: Capillary refill takes less than 2 seconds.  Neurological:     General: No focal deficit present.     Mental Status: He is alert and oriented to person, place, and time.  Psychiatric:        Mood and Affect: Mood normal.        Behavior: Behavior normal.        Thought Content: Thought content normal.      UC Treatments / Results  Labs (all labs ordered are listed, but only abnormal results are displayed) Labs Reviewed - No data to display  EKG   Radiology DG Finger Ring Left  Result Date: 08/02/2020 CLINICAL DATA:  30 year old male with pain and swelling about the left fourth proximal interphalangeal joint. EXAM: LEFT RING FINGER 2+V COMPARISON:  None. FINDINGS: There is no evidence of fracture or dislocation. There is no evidence of arthropathy or other focal bone abnormality. Soft tissue prominence about the dorsal aspect of the proximal interphalangeal joint. No radiopaque foreign bodies. IMPRESSION: No acute fracture or malalignment. Soft tissue prominence about the dorsal aspect of the proximal interphalangeal joint. Electronically Signed   By: Ruthann Cancer MD   On: 08/02/2020 11:48    Procedures Procedures (including critical care time)  Medications Ordered in UC Medications - No  data to display  Initial Impression / Assessment and Plan / UC Course  I have reviewed the triage vital signs and the nursing notes.  Pertinent labs & imaging results that were available during my care of the patient were reviewed by me and considered in my medical decision making (see chart for details).    Pain of left ring finger Swelling of left ring finger  X-ray negative for fracture or misalignment Steroid taper prescribed Could be related to overall inflammation in general Discussed gout versus pseudogout, ankylosing spondylitis diagnosis Follow up with this office or with primary care if symptoms are persisting.  Follow up in the ER for high fever, trouble swallowing, trouble breathing, other concerning symptoms.   Final Clinical Impressions(s) / UC Diagnoses   Final diagnoses:  Swelling of left ring finger     Discharge Instructions     I have sent in a prednisone taper for you to take for 6 days. 6 tablets on day one, 5 tablets on day two, 4 tablets on day three, 3 tablets on day four, 2 tablets on day five, and 1 tablet on day six.  Follow up with this office or with primary care if symptoms are persisting.  Follow up in the ER for high fever, trouble swallowing, trouble breathing, other concerning symptoms.      ED Prescriptions    Medication Sig Dispense Auth. Provider   predniSONE (STERAPRED UNI-PAK 21 TAB) 10 MG (21) TBPK tablet Take by mouth daily for 6 days. Take 6 tablets on day 1, 5 tablets on day 2,  4 tablets on day 3, 3 tablets on day 4, 2 tablets on day 5, 1 tablet on day 6 21 tablet Faustino Congress, NP     PDMP not reviewed this encounter.   Faustino Congress, NP 08/02/20 [phone]

## 2020-08-02 NOTE — ED Triage Notes (Signed)
Left ring finger swollen.  No known injury.  Also states left knee hurts from a fall at work back in September.

## 2020-08-25 ENCOUNTER — Other Ambulatory Visit: Payer: Self-pay

## 2020-08-25 ENCOUNTER — Encounter: Payer: Self-pay | Admitting: Emergency Medicine

## 2020-08-25 ENCOUNTER — Ambulatory Visit
Admission: EM | Admit: 2020-08-25 | Discharge: 2020-08-25 | Disposition: A | Discharge status: Home / Self Care | Payer: 59 | Attending: Family Medicine | Admitting: Family Medicine

## 2020-08-25 DIAGNOSIS — M2550 Pain in unspecified joint: Secondary | ICD-10-CM

## 2020-08-25 DIAGNOSIS — R030 Elevated blood-pressure reading, without diagnosis of hypertension: Secondary | ICD-10-CM | POA: Diagnosis not present

## 2020-08-25 MED ORDER — MISC. DEVICES MISC: 0 refills | Status: DC

## 2020-08-25 MED ORDER — PREDNISONE 10 MG (48) PO TBPK: ORAL_TABLET | ORAL | 0 refills | Status: DC

## 2020-08-25 MED ORDER — ALBUTEROL SULFATE HFA 108 (90 BASE) MCG/ACT IN AERS: 1.0000 | INHALATION_SPRAY | Freq: Four times a day (QID) | RESPIRATORY_TRACT | 1 refills | Status: DC | PRN

## 2020-08-25 NOTE — ED Provider Notes (Signed)
[place]   [id number] 08/25/20 Arrival Time: 2725  ASSESSMENT & PLAN:  1. Pain in joint, multiple sites   2. Elevated blood pressure reading without diagnosis of hypertension      Discharge Instructions      Celedonio Miyamoto, MD from [facility] has referred you to rheumatology:  [facility]   [address] [place], [place] [address]   Phone: [phone]   Fax: [phone]    Your blood pressure was noted to be elevated during your visit today. If you are currently taking medication for high blood pressure, please ensure you are taking this as directed. If you do not have a history of high blood pressure and your blood pressure remains persistently elevated, you may need to begin taking a medication at some point. You may return here within the next few days to recheck if unable to see your primary care provider or if you do not have a one.  BP (!) 156/100 (BP Location: Right Arm)   Pulse (!) 102   Temp 98.2 F (36.8 C) (Oral)   Resp 19   SpO2 95%   BP Readings from Last 3 Encounters:  08/25/20 (!) 156/100  08/02/20 (!) 149/96  06/24/20 (!) 157/99       Meds ordered this encounter  Medications   predniSONE (STERAPRED UNI-PAK 48 TAB) 10 MG (48) TBPK tablet    Sig: Take as directed.    Dispense:  48 tablet    Refill:  0   Misc. Devices MISC    Sig: Walker to assist ambulation. Dx: joint pain; multiple sites    Dispense:  1 Units    Refill:  0   albuterol (VENTOLIN HFA) 108 (90 Base) MCG/ACT inhaler    Sig: Inhale 1-2 puffs into the lungs every 6 (six) hours as needed for wheezing or shortness of breath.    Dispense:  1 each    Refill:  1    Orders Placed This Encounter  Procedures   Ambulatory referral to Rheumatology     Reviewed expectations re: course of current medical issues. Questions answered. Outlined signs and symptoms indicating need for more acute intervention. Patient verbalized understanding. After Visit  Summary given.  SUBJECTIVE: History from: patient. Leon Mason is a 30 y.o. male who reports persistent joint pains of multiple sites; past month or two. Ophthalmologist questioned ankylosing spondylitis given eye exam; better with steroid drops. Has been on oral steroids for one week; helped. Having trouble moving around now. Joints in fingers swell at times. No specific aggravating or alleviating factors reported. Trying to get in with a rheumatologist. Increased blood pressure noted today. Reports that he has not been treated for hypertension in the past. He reports no chest pain on exertion, no dyspnea on exertion, no orthostatic dizziness or lightheadedness, no orthopnea or paroxysmal nocturnal dyspnea, and no palpitations.   Past Surgical History:  Procedure Laterality Date   WISDOM TOOTH EXTRACTION  2009   all four      OBJECTIVE:  Vitals:   08/25/20 0844  BP: (!) 156/100  Pulse: (!) 102  Resp: 19  Temp: 98.2 F (36.8 C)  TempSrc: Oral  SpO2: 95%    Slight tachycardia noted.  General appearance: alert; no distress HEENT: [place]; AT Neck: supple with FROM Resp: unlabored respirations Extremities: L 4th finger is swollen at PIP; reports aches in L knee with slight swelling CV: brisk extremity capillary refill of all extremities Skin: warm  and dry; no visible rashes Neurologic: in wheelchair; normal sensation in all extremities Psychological: alert and cooperative; normal mood and affect    Allergies  Allergen Reactions   Other Hives and Shortness Of Breath    Dogs and Cats.   Dust Mite Extract    Pollen Extract     Past Medical History:  Diagnosis Date   Anxiety    Asthma    Bipolar 1 disorder ([place])    Hallucinations    Schizoaffective disorder ([facility])    Schizophrenia ([facility])    Social History   Socioeconomic History   Marital status: Single    Spouse name: Not on file   Number of children: Not on file   Years of education: Not on file   Highest  education level: Not on file  Occupational History   Not on file  Tobacco Use   Smoking status: Every Day    Packs/day: 0.50    Years: 0.50    Pack years: 0.25    Types: Cigarettes    Last attempt to quit: 05/28/2018    Years since quitting: 2.2   Smokeless tobacco: Former  Scientific laboratory technician Use: Never used  Substance and Sexual Activity   Alcohol use: Yes    Alcohol/week: 56.0 standard drinks    Types: 20 Cans of beer per week   Drug use: Yes    Types: Marijuana   Sexual activity: Not Currently  Other Topics Concern   Not on file  Social History Narrative   Not on file   Social Determinants of Health   Financial Resource Strain: Not on file  Food Insecurity: Not on file  Transportation Needs: Not on file  Physical Activity: Not on file  Stress: Not on file  Social Connections: Not on file   Family History  Problem Relation Age of Onset   Eczema Brother    Post-traumatic stress disorder Brother    COPD Maternal Aunt    Bipolar disorder Mother    Alcoholism Mother    Diabetes Mother    OCD Father    Bipolar disorder Father    Heart attack Maternal Grandmother    Breast cancer Maternal Grandmother    Cancer Paternal Grandmother    Heart attack Paternal Grandfather    Asthma Neg Hx    Allergic rhinitis Neg Hx    Angioedema Neg Hx    Urticaria Neg Hx    Immunodeficiency Neg Hx    Atopy Neg Hx    Prostate cancer Neg Hx    Colon cancer Neg Hx    Past Surgical History:  Procedure Laterality Date   WISDOM TOOTH EXTRACTION  2009   all four       Vanessa Kick, MD 08/25/20 [phone]

## 2020-08-25 NOTE — ED Triage Notes (Signed)
LT knee ,LT hand pain and swelling.  Pain to RT hip.  Denies injury. Pt seen here previously and given prednisone that helped for a few days. Trying to get an appt to rheumatologist.

## 2020-08-25 NOTE — Discharge Instructions (Addendum)
Celedonio Miyamoto, MD from [facility] has referred you to rheumatology:  [facility]   [address] [place], [place] [address]   Phone: [phone]   Fax: [phone]    Your blood pressure was noted to be elevated during your visit today. If you are currently taking medication for high blood pressure, please ensure you are taking this as directed. If you do not have a history of high blood pressure and your blood pressure remains persistently elevated, you may need to begin taking a medication at some point. You may return here within the next few days to recheck if unable to see your primary care provider or if you do not have a one.  BP (!) 156/100 (BP Location: Right Arm)   Pulse (!) 102   Temp 98.2 F (36.8 C) (Oral)   Resp 19   SpO2 95%   BP Readings from Last 3 Encounters:  08/25/20 (!) 156/100  08/02/20 (!) 149/96  06/24/20 (!) 157/99

## 2020-08-27 ENCOUNTER — Other Ambulatory Visit: Payer: Self-pay | Admitting: Adult Health

## 2020-08-27 DIAGNOSIS — G4700 Insomnia, unspecified: Secondary | ICD-10-CM

## 2020-08-27 DIAGNOSIS — F331 Major depressive disorder, recurrent, moderate: Secondary | ICD-10-CM

## 2020-08-27 DIAGNOSIS — F411 Generalized anxiety disorder: Secondary | ICD-10-CM

## 2020-08-27 DIAGNOSIS — F203 Undifferentiated schizophrenia: Secondary | ICD-10-CM

## 2020-08-29 ENCOUNTER — Ambulatory Visit: Payer: 59 | Admitting: Adult Health

## 2020-08-29 NOTE — Telephone Encounter (Signed)
He has apt this afternoon

## 2020-09-03 ENCOUNTER — Ambulatory Visit (INDEPENDENT_AMBULATORY_CARE_PROVIDER_SITE_OTHER): Payer: 59 | Admitting: Adult Health

## 2020-09-03 ENCOUNTER — Other Ambulatory Visit: Payer: Self-pay

## 2020-09-03 ENCOUNTER — Encounter: Payer: Self-pay | Admitting: Adult Health

## 2020-09-03 DIAGNOSIS — F331 Major depressive disorder, recurrent, moderate: Secondary | ICD-10-CM | POA: Diagnosis not present

## 2020-09-03 DIAGNOSIS — F203 Undifferentiated schizophrenia: Secondary | ICD-10-CM | POA: Diagnosis not present

## 2020-09-03 DIAGNOSIS — F411 Generalized anxiety disorder: Secondary | ICD-10-CM

## 2020-09-03 DIAGNOSIS — G4700 Insomnia, unspecified: Secondary | ICD-10-CM | POA: Diagnosis not present

## 2020-09-03 DIAGNOSIS — R9431 Abnormal electrocardiogram [ECG] [EKG]: Secondary | ICD-10-CM

## 2020-09-03 MED ORDER — SERTRALINE HCL 50 MG PO TABS: 50.0000 mg | ORAL_TABLET | Freq: Every day | ORAL | 2 refills | Status: DC

## 2020-09-03 MED ORDER — PERPHENAZINE 2 MG PO TABS
2.0000 mg | ORAL_TABLET | Freq: Three times a day (TID) | ORAL | 2 refills | Status: DC
Start: 2020-09-03 — End: 2021-03-13

## 2020-09-03 NOTE — Progress Notes (Signed)
Leon Mason [id number] 1991-02-27 30 y.o.  Subjective:   Patient ID:  Leon Mason is a 30 y.o. (DOB 11/30/1990) male.  Chief Complaint: No chief complaint on file.   HPI Leon Mason presents to the office today for follow-up of MDD, GAD, insomnia, Schizophrenia.  Describes mood today as "improved". Pleasant. Mood symptoms - reports decreased depression, anxiety, and irritability. Stating "I'm doing alright". Feels like medications continue to work well for him. Increased situational stressors. Concerned about health issues. Diagnosed with ankylosing spondylitis - has been in a lot of pain. Waiting for a rheumatology referral. Living at home with mother - helping out with her care. Brother recently moved back in. Fishing more. Stable interest and motivation. Seeing [name] for therapy.  Energy levels stable. Active, does not have a regular exercise routine. Enjoys some usual interests and activities. Single. Lives with mother (disabled) - brother recently moved in, 2 cats and a dog. Spending time with family - Aunt and uncle local.  Appetite adequate. Weight gain - 15 pounds 285 to 300 pounds. Sleeping better some nights than others. Averages 6 hours. Taking a nap every other day. Focus and concentration stable. Completing tasks. Managing aspects of household. Currently unemployed. Denies SI or HI.  Denies AH or VH.   Previous medications: Perphenazine, Lithium, Sertraline, Haldol, Zyprexa, Risperdal, stopped due to various side effects, ie biting tongue a lot, restless legs, weight gain    AIMS    Flowsheet Row Admission (Discharged) from 03/28/2018 in [place] [facility] Admission (Discharged) from 08/22/2016 in [place] [facility]  AIMS Total Score 0 0      AUDIT    Flowsheet Row Admission (Discharged) from 03/28/2018 in [place] [facility] Admission (Discharged) from 08/22/2016 in  [place] [facility] Admission (Discharged) from 06/13/2013 in [place] [facility]  Alcohol Use Disorder Identification Test Final Score (AUDIT) 18 16 22       PHQ2-9    Flowsheet Row Office Visit from 12/10/2017 in [place] Visit from 12/17/2016 in [place]  PHQ-2 Total Score 3 0  PHQ-9 Total Score 8 --      Flowsheet Row ED from 08/25/2020 in [facility] [place] ED from 08/02/2020 in [facility] ED from 06/24/2020 in [facility] [place] No Risk No Risk No Risk        Review of Systems:  Review of Systems  Musculoskeletal:  Negative for gait problem.  Neurological:  Negative for tremors.  Psychiatric/Behavioral:         Please refer to HPI   Medications: I have reviewed the patient's current medications.  Current Outpatient Medications  Medication Sig Dispense Refill   albuterol (PROVENTIL) (2.5 MG/3ML) 0.083% nebulizer solution Inhale 3 mLs (2.5 mg total) into the lungs every 6 (six) hours as needed for wheezing or shortness of breath. 75 mL 1   albuterol (VENTOLIN HFA) 108 (90 Base) MCG/ACT inhaler Inhale 1-2 puffs into the lungs every 6 (six) hours as needed for wheezing or shortness of breath. 1 each 1   Ascorbic Acid (VITAMIN C PO) Take 1 tablet by mouth daily.     guaiFENesin-codeine (ROBITUSSIN AC) 100-10 MG/5ML syrup Take 5 mLs by mouth 3 (three) times daily as needed for cough. 120 mL 0   Misc. Devices MISC Walker to assist ambulation. Dx:  joint pain; multiple sites 1 Units 0   perphenazine (TRILAFON) 2 MG tablet Take 1 tablet (2 mg total) by mouth 3 (three) times daily. 90 tablet 2   predniSONE (STERAPRED UNI-PAK 48 TAB) 10 MG (48) TBPK tablet Take as directed. 48 tablet 0   sertraline (ZOLOFT) 50 MG tablet Take 1 tablet (50 mg total) by mouth daily. 30 tablet 2   No current  facility-administered medications for this visit.    Medication Side Effects: None  Allergies:  Allergies  Allergen Reactions   Other Hives and Shortness Of Breath    Dogs and Cats.   Dust Mite Extract    Pollen Extract     Past Medical History:  Diagnosis Date   Anxiety    Asthma    Bipolar 1 disorder ([place])    Hallucinations    Schizoaffective disorder (HCC)    Schizophrenia (HCC)     Past Medical History, Surgical history, Social history, and Family history were reviewed and updated as appropriate.   Please see review of systems for further details on the patient's review from today.   Objective:   Physical Exam:  There were no vitals taken for this visit.  Physical Exam Constitutional:      General: He is not in acute distress. Musculoskeletal:        General: No deformity.  Neurological:     Mental Status: He is alert and oriented to person, place, and time.     Coordination: Coordination normal.  Psychiatric:        Attention and Perception: Attention and perception normal. He does not perceive auditory or visual hallucinations.        Mood and Affect: Mood normal. Mood is not anxious or depressed. Affect is not labile, blunt, angry or inappropriate.        Speech: Speech normal.        Behavior: Behavior normal.        Thought Content: Thought content normal. Thought content is not paranoid or delusional. Thought content does not include homicidal or suicidal ideation. Thought content does not include homicidal or suicidal plan.        Cognition and Memory: Cognition and memory normal.        Judgment: Judgment normal.     Comments: Insight intact    Lab Review:     Component Value Date/Time   NA 139 02/19/2019 0602   K 4.2 02/19/2019 0602   CL 105 02/19/2019 0602   CO2 23 02/19/2019 0602   GLUCOSE 124 (H) 02/19/2019 0602   BUN 11 02/19/2019 0602   CREATININE 1.06 02/19/2019 0602   CALCIUM 9.7 02/19/2019 0602   PROT 7.1 02/18/2019 0412   ALBUMIN  4.0 02/18/2019 0412   AST 40 02/18/2019 0412   ALT 65 (H) 02/18/2019 0412   ALKPHOS 55 02/18/2019 0412   BILITOT 1.0 02/18/2019 0412   GFRNONAA >60 02/19/2019 0602   GFRAA >60 02/19/2019 0602       Component Value Date/Time   WBC 8.6 02/19/2019 0602   RBC 4.78 02/19/2019 0602   HGB 15.0 02/19/2019 0602   HCT 44.7 02/19/2019 0602   PLT 191 02/19/2019 0602   MCV 93.5 02/19/2019 0602   MCH 31.4 02/19/2019 0602   MCHC 33.6 02/19/2019 0602   RDW 13.2 02/19/2019 0602   LYMPHSABS 1.0 02/19/2019 0602   MONOABS 0.4 02/19/2019 0602   EOSABS 0.0 02/19/2019 0602   BASOSABS 0.0 02/19/2019 0602    Lithium Lvl  Date  Value Ref Range Status  03/30/2018 <0.06 (L) 0.60 - 1.20 mmol/L Final    Comment:    REPEATED TO VERIFY Performed at [facility] [address], [place], [place] [address]      No results found for: PHENYTOIN, PHENOBARB, VALPROATE, CBMZ   .res Assessment: Plan:    Plan:  PDMP reviewed  1. Continue Zoloft 50mg   2. Continue Perphenazine 4mg  at hs and 2mg  in the morning.  Patient noted to have a prolonged QT interval on EKG in 2020 - will repeat EKG for comparison - order placed and patient notified.   RTC 3 months  Patient advised to contact office with any questions, adverse effects, or acute worsening in signs and symptoms.  Diagnoses and all orders for this visit:  Generalized anxiety disorder -     sertraline (ZOLOFT) 50 MG tablet; Take 1 tablet (50 mg total) by mouth daily.  Undifferentiated schizophrenia (HCC) - stable -     sertraline (ZOLOFT) 50 MG tablet; Take 1 tablet (50 mg total) by mouth daily. -     perphenazine (TRILAFON) 2 MG tablet; Take 1 tablet (2 mg total) by mouth 3 (three) times daily.  Insomnia, unspecified type -     sertraline (ZOLOFT) 50 MG tablet; Take 1 tablet (50 mg total) by mouth daily.  Undifferentiated schizophrenia (HCC) -     sertraline (ZOLOFT) 50 MG tablet; Take 1 tablet (50 mg total) by mouth  daily. -     perphenazine (TRILAFON) 2 MG tablet; Take 1 tablet (2 mg total) by mouth 3 (three) times daily.  Major depressive disorder, recurrent episode, moderate (HCC) -     sertraline (ZOLOFT) 50 MG tablet; Take 1 tablet (50 mg total) by mouth daily.  Prolonged QT interval -     EKG 12-Lead    Please see After Visit Summary for patient specific instructions.  Future Appointments  Date Time Provider [place]  10/14/2020  8:15 AM Bo Merino, MD [facility] None  11/20/2020 11:15 AM Bo Merino, MD [facility] None    Orders Placed This Encounter  Procedures   EKG 12-Lead    -------------------------------

## 2020-09-30 NOTE — Progress Notes (Deleted)
Office Visit Note  Patient: Leon Mason             Date of Birth: 08/02/1990           MRN: [id number]             PCP: Pcp, No Referring: Vanessa Kick, MD Visit Date: 10/14/2020 Occupation: '@GUAROCC' @  Subjective:  No chief complaint on file.   History of Present Illness: Leon Mason is a 30 y.o. male ***   Activities of Daily Living:  Patient reports morning stiffness for *** {minute/hour:19697}.   Patient {ACTIONS;DENIES/REPORTS:21021675::"Denies"} nocturnal pain.  Difficulty dressing/grooming: {ACTIONS;DENIES/REPORTS:21021675::"Denies"} Difficulty climbing stairs: {ACTIONS;DENIES/REPORTS:21021675::"Denies"} Difficulty getting out of chair: {ACTIONS;DENIES/REPORTS:21021675::"Denies"} Difficulty using hands for taps, buttons, cutlery, and/or writing: {ACTIONS;DENIES/REPORTS:21021675::"Denies"}  No Rheumatology ROS completed.   PMFS History:  Patient Active Problem List   Diagnosis Date Noted  . Mild intermittent asthma with (acute) exacerbation 02/18/2019  . Hypokalemia 02/18/2019  . Normocytic anemia 02/18/2019  . Elevated transaminase level 02/18/2019  . Prolonged QT interval 02/18/2019  . Viral syndrome 02/18/2019  . SIRS (systemic inflammatory response syndrome) ([place]) 02/18/2019  . Globus sensation 07/04/2018  . Schizophrenia ([place]) 03/28/2018  . MDD (major depressive disorder), recurrent episode, severe ([place]) 03/28/2018  . Schizo-affective schizophrenia, chronic condition with acute exacerbation ([place]) 03/28/2018  . Tobacco use disorder 12/17/2016  . Schizoaffective disorder, bipolar type ([place]) 08/23/2016  . Alcohol use disorder, severe, dependence ([place]) 08/23/2016  . Allergic rhinitis due to pollen 10/09/2015  . 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 Mild persistent allergic asthma 10/09/2015  . Psychosis ([place]) 06/14/2013    Past Medical History:  Diagnosis Date  . Anxiety   . Asthma   . Bipolar 1 disorder ([place])   . Hallucinations   . Schizoaffective  disorder ([place])   . Schizophrenia ([place])     Family History  Problem Relation Age of Onset  . Eczema Brother   . Post-traumatic stress disorder Brother   . COPD Maternal Aunt   . Bipolar disorder Mother   . Alcoholism Mother   . Diabetes Mother   . OCD Father   . Bipolar disorder Father   . Heart attack Maternal Grandmother   . Breast cancer Maternal Grandmother   . Cancer Paternal Grandmother   . Heart attack Paternal Grandfather   . Asthma Neg Hx   . Allergic rhinitis Neg Hx   . Angioedema Neg Hx   . Urticaria Neg Hx   . Immunodeficiency Neg Hx   . Atopy Neg Hx   . Prostate cancer Neg Hx   . Colon cancer Neg Hx    Past Surgical History:  Procedure Laterality Date  . WISDOM TOOTH EXTRACTION  2009   all four   Social History   Social History Narrative  . Not on file   Immunization History  Administered Date(s) Administered  . DTaP 01/03/1990, 03/17/1990, 06/08/1990, 02/20/1991, 06/01/1994  . Hepatitis B 01/05/2001, 02/01/2001, 05/04/2005  . HiB (PRP-OMP) 01/03/1990, 03/17/1990, 06/08/1990, 02/20/1991  . IPV 01/03/1990, 03/17/1990, 02/20/1991, 06/01/1994  . Influenza,inj,Quad PF,6+ Mos 02/20/2019  . Influenza,inj,quad, With Preservative 03/18/2018  . MMR 02/20/1991, 06/01/1994  . Meningococcal Conjugate 05/04/2005  . Pneumococcal Polysaccharide-23 08/29/2016, 02/20/2019  . Tdap 05/04/2005     Objective: Vital Signs: There were no vitals taken for this visit.   Physical Exam   Musculoskeletal Exam: ***  CDAI Exam: CDAI Score: -- Patient Global: --; Provider Global: -- Swollen: --; Tender: -- Joint Exam 10/14/2020   No joint exam has been documented  for this visit   There is currently no information documented on the homunculus. Go to the Rheumatology activity and complete the homunculus joint exam.  Investigation: No additional findings.  Imaging: No results found.  Recent Labs: Lab Results  Component Value Date   WBC 8.6 02/19/2019   HGB 15.0  02/19/2019   PLT 191 02/19/2019   NA 139 02/19/2019   K 4.2 02/19/2019   CL 105 02/19/2019   CO2 23 02/19/2019   GLUCOSE 124 (H) 02/19/2019   BUN 11 02/19/2019   CREATININE 1.06 02/19/2019   BILITOT 1.0 02/18/2019   ALKPHOS 55 02/18/2019   AST 40 02/18/2019   ALT 65 (H) 02/18/2019   PROT 7.1 02/18/2019   ALBUMIN 4.0 02/18/2019   CALCIUM 9.7 02/19/2019   GFRAA >60 02/19/2019    Speciality Comments: No specialty comments available.  Procedures:  No procedures performed Allergies: Other, Dust mite extract, and Pollen extract   Assessment / Plan:     Visit Diagnoses: Iritis - OS. First episode 11/2019. Dr. Clent Jacks referred pt to Dr. Manuella Ghazi.  HLA B27 positive  Orders: No orders of the defined types were placed in this encounter.  No orders of the defined types were placed in this encounter.   Face-to-face time spent with patient was *** minutes. Greater than 50% of time was spent in counseling and coordination of care.  Follow-Up Instructions: No follow-ups on file.   Ofilia Neas, PA-C  Note - This record has been created using Dragon software.  Chart creation errors have been sought, but may not always  have been located. Such creation errors do not reflect on  the standard of medical care.

## 2020-10-14 ENCOUNTER — Ambulatory Visit: Payer: 59 | Admitting: Rheumatology

## 2020-10-14 DIAGNOSIS — F1020 Alcohol dependence, uncomplicated: Secondary | ICD-10-CM

## 2020-10-14 DIAGNOSIS — R9431 Abnormal electrocardiogram [ECG] [EKG]: Secondary | ICD-10-CM

## 2020-10-14 DIAGNOSIS — D649 Anemia, unspecified: Secondary | ICD-10-CM

## 2020-10-14 DIAGNOSIS — L500 Allergic urticaria: Secondary | ICD-10-CM

## 2020-10-14 DIAGNOSIS — H209 Unspecified iridocyclitis: Secondary | ICD-10-CM

## 2020-10-14 DIAGNOSIS — R6510 Systemic inflammatory response syndrome (SIRS) of non-infectious origin without acute organ dysfunction: Secondary | ICD-10-CM

## 2020-10-14 DIAGNOSIS — Z1589 Genetic susceptibility to other disease: Secondary | ICD-10-CM

## 2020-10-14 DIAGNOSIS — F259 Schizoaffective disorder, unspecified: Secondary | ICD-10-CM

## 2020-10-14 DIAGNOSIS — F17200 Nicotine dependence, unspecified, uncomplicated: Secondary | ICD-10-CM

## 2020-10-14 DIAGNOSIS — J4530 Mild persistent asthma, uncomplicated: Secondary | ICD-10-CM

## 2020-10-21 ENCOUNTER — Encounter: Payer: Self-pay | Admitting: Nurse Practitioner

## 2020-10-21 ENCOUNTER — Other Ambulatory Visit: Payer: Self-pay

## 2020-10-21 ENCOUNTER — Ambulatory Visit (INDEPENDENT_AMBULATORY_CARE_PROVIDER_SITE_OTHER): Payer: 59 | Admitting: Nurse Practitioner

## 2020-10-21 VITALS — BP 115/66 | HR 77 | Temp 97.7°F | Resp 20 | Ht 75.0 in | Wt 282.0 lb

## 2020-10-21 DIAGNOSIS — M2550 Pain in unspecified joint: Secondary | ICD-10-CM

## 2020-10-21 DIAGNOSIS — R1084 Generalized abdominal pain: Secondary | ICD-10-CM | POA: Diagnosis not present

## 2020-10-21 MED ORDER — ACETAMINOPHEN 500 MG PO TABS: 500.0000 mg | ORAL_TABLET | Freq: Four times a day (QID) | ORAL | 0 refills | Status: DC | PRN

## 2020-10-21 MED ORDER — PREDNISONE 10 MG (21) PO TBPK: ORAL_TABLET | ORAL | 0 refills | Status: DC

## 2020-10-21 MED ORDER — METHYLPREDNISOLONE ACETATE 40 MG/ML IJ SUSP
80.0000 mg | Freq: Once | INTRAMUSCULAR | Status: AC
Start: 2020-10-21 — End: 2020-10-21
  Administered 2020-10-21: 80 mg via INTRAMUSCULAR

## 2020-10-21 MED ORDER — PEPTO-BISMOL 524 MG/30ML PO SUSP: 30.0000 mL | Freq: Four times a day (QID) | ORAL | 0 refills | Status: DC | PRN

## 2020-10-21 NOTE — Progress Notes (Signed)
Acute Office Visit  Subjective:    Patient ID: Leon Mason, male    DOB: 1990/05/25, 30 y.o.   MRN: [id number]  Chief Complaint  Patient presents with   Joint Pain    Right ankle, right hip, left knee - going on for sometime - seen urgent care and eye Dr = waiting on referral to Rheumatology- appt there 12/04/20.     HPI Patient is in today for Pain  He reports recurrent right ankle, right hip, left knee pain. was not an injury that may have caused the pain.  Patient is set to see rheumatology September 29.  The pain started a few months ago and is gradually worsening. The pain does not radiate . The pain is described as aching, soreness, stiffness, and throbbing, is severe and 8/10 in intensity, occurring constantly. Symptoms are worse in the: morning, mid-day, afternoon, evening, nighttime  Aggravating factors: none Relieving factors: none.  He has tried application of ice with no relief.   Abdominal Pain: Patient complains of abdominal pain. The pain is located in the entire abdomen. The pain is described as aching and cramping, and is 5/10 in intensity. Onset was a few days ago. Symptoms have been unchanged since. Aggravating factors include none.  Alleviating factors include none. Associated symptoms include diarrhea. The patient denies constipation and hematochezia.    Past Medical History:  Diagnosis Date   Anxiety    Asthma    Bipolar 1 disorder ([place])    Hallucinations    Schizoaffective disorder ([place])    Schizophrenia ([place])     Past Surgical History:  Procedure Laterality Date   WISDOM TOOTH EXTRACTION  2009   all four    Family History  Problem Relation Age of Onset   Eczema Brother    Post-traumatic stress disorder Brother    COPD Maternal Aunt    Bipolar disorder Mother    Alcoholism Mother    Diabetes Mother    OCD Father    Bipolar disorder Father    Heart attack Maternal Grandmother    Breast cancer Maternal Grandmother    Cancer Paternal  Grandmother    Heart attack Paternal Grandfather    Asthma Neg Hx    Allergic rhinitis Neg Hx    Angioedema Neg Hx    Urticaria Neg Hx    Immunodeficiency Neg Hx    Atopy Neg Hx    Prostate cancer Neg Hx    Colon cancer Neg Hx     Social History   Socioeconomic History   Marital status: Single    Spouse name: Not on file   Number of children: Not on file   Years of education: Not on file   Highest education level: Not on file  Occupational History   Not on file  Tobacco Use   Smoking status: Every Day    Packs/day: 0.50    Years: 0.50    Pack years: 0.25    Types: Cigarettes    Last attempt to quit: 05/28/2018    Years since quitting: 2.4   Smokeless tobacco: Former  Scientific laboratory technician Use: Never used  Substance and Sexual Activity   Alcohol use: Yes    Alcohol/week: 56.0 standard drinks    Types: 56 Cans of beer per week   Drug use: Yes    Types: Marijuana   Sexual activity: Not Currently  Other Topics Concern   Not on file  Social History Narrative   Not on file  Social Determinants of Health   Financial Resource Strain: Not on file  Food Insecurity: Not on file  Transportation Needs: Not on file  Physical Activity: Not on file  Stress: Not on file  Social Connections: Not on file  Intimate Partner Violence: Not on file    Outpatient Medications Prior to Visit  Medication Sig Dispense Refill   albuterol (PROVENTIL) (2.5 MG/3ML) 0.083% nebulizer solution Inhale 3 mLs (2.5 mg total) into the lungs every 6 (six) hours as needed for wheezing or shortness of breath. 75 mL 1   albuterol (VENTOLIN HFA) 108 (90 Base) MCG/ACT inhaler Inhale 1-2 puffs into the lungs every 6 (six) hours as needed for wheezing or shortness of breath. 1 each 1   perphenazine (TRILAFON) 2 MG tablet Take 1 tablet (2 mg total) by mouth 3 (three) times daily. 90 tablet 2   sertraline (ZOLOFT) 50 MG tablet Take 1 tablet (50 mg total) by mouth daily. 30 tablet 2   Ascorbic Acid (VITAMIN  C PO) Take 1 tablet by mouth daily. (Patient not taking: Reported on 10/21/2020)     guaiFENesin-codeine (ROBITUSSIN AC) 100-10 MG/5ML syrup Take 5 mLs by mouth 3 (three) times daily as needed for cough. 120 mL 0   Misc. Devices MISC Walker to assist ambulation. Dx: joint pain; multiple sites 1 Units 0   predniSONE (STERAPRED UNI-PAK 48 TAB) 10 MG (48) TBPK tablet Take as directed. 48 tablet 0   No facility-administered medications prior to visit.    Allergies  Allergen Reactions   Other Hives and Shortness Of Breath    Dogs and Cats.   Dust Mite Extract    Pollen Extract     Review of Systems  Constitutional: Negative.   HENT: Negative.    Eyes: Negative.   Respiratory: Negative.    Gastrointestinal: Negative.   Musculoskeletal:  Positive for arthralgias and joint swelling.  Skin:  Negative for rash.  Neurological: Negative.   All other systems reviewed and are negative.     Objective:    Physical Exam Vitals and nursing note reviewed.  Constitutional:      Appearance: Normal appearance. He is obese.  HENT:     Head: Normocephalic.     Nose: Nose normal.     Mouth/Throat:     Mouth: Mucous membranes are moist.     Pharynx: Oropharynx is clear.  Eyes:     Conjunctiva/sclera: Conjunctivae normal.  Cardiovascular:     Rate and Rhythm: Normal rate and regular rhythm.  Pulmonary:     Effort: Pulmonary effort is normal.     Breath sounds: Normal breath sounds.  Abdominal:     General: Bowel sounds are normal.     Tenderness: There is generalized abdominal tenderness.  Musculoskeletal:     Right hand: Tenderness present.     Left hand: Tenderness present.     Right knee: Decreased range of motion. Tenderness present.     Left knee: Decreased range of motion. Tenderness present.     Right ankle: Tenderness present.     Left ankle: Tenderness present.  Skin:    General: Skin is warm.     Findings: No rash.  Neurological:     Mental Status: He is alert and oriented  to person, place, and time.    BP 115/66   Pulse 77   Temp 97.7 F (36.5 C)   Resp 20   Ht '6\' 3"'$  (1.905 m)   Wt 282 lb (127.9 kg)  SpO2 96%   BMI 35.25 kg/m  Wt Readings from Last 3 Encounters:  10/21/20 282 lb (127.9 kg)  02/18/19 285 lb (129.3 kg)  01/18/19 280 lb (127 kg)    Health Maintenance Due  Topic Date Due   COVID-19 Vaccine (1) Never done   Hepatitis C Screening  Never done   TETANUS/TDAP  05/05/2015   Pneumococcal Vaccine 3-48 Years old (3 - PCV) 02/20/2020    There are no preventive care reminders to display for this patient.   Lab Results  Component Value Date   TSH 1.671 03/30/2018   Lab Results  Component Value Date   WBC 8.6 02/19/2019   HGB 15.0 02/19/2019   HCT 44.7 02/19/2019   MCV 93.5 02/19/2019   PLT 191 02/19/2019   Lab Results  Component Value Date   NA 139 02/19/2019   K 4.2 02/19/2019   CO2 23 02/19/2019   GLUCOSE 124 (H) 02/19/2019   BUN 11 02/19/2019   CREATININE 1.06 02/19/2019   BILITOT 1.0 02/18/2019   ALKPHOS 55 02/18/2019   AST 40 02/18/2019   ALT 65 (H) 02/18/2019   PROT 7.1 02/18/2019   ALBUMIN 4.0 02/18/2019   CALCIUM 9.7 02/19/2019   ANIONGAP 11 02/19/2019   Lab Results  Component Value Date   CHOL 221 (H) 03/30/2018   Lab Results  Component Value Date   HDL 35 (L) 03/30/2018   Lab Results  Component Value Date   LDLCALC 147 (H) 03/30/2018   Lab Results  Component Value Date   TRIG 194 (H) 03/30/2018   Lab Results  Component Value Date   CHOLHDL 6.3 03/30/2018   Lab Results  Component Value Date   HGBA1C 5.1 03/30/2018       Assessment & Plan:   Problem List Items Addressed This Visit       Other   Generalized joint pain - Primary    Worsening joint pain in the last few weeks.  Patient is scheduled to see rheumatology December 04, 2020.  He missed his last appointment and was rescheduled for 2 months later.  Patient is not getting any relief from pain.  He describes his pain as aching  and soreness in all joints of his body bilateral upper and bilateral lower extremities.  Patient is not taking any medication for symptom management.  I am starting patient on prednisone taper, Tylenol for pain, 80 Depo-Medrol shot given in clinic.  Please follow-up with rheumatology  And return with worsening or unresolved symptoms.      Relevant Medications   predniSONE (STERAPRED UNI-PAK 21 TAB) 10 MG (21) TBPK tablet   acetaminophen (TYLENOL) 500 MG tablet   Generalized abdominal pain    Unresolved abdominal pain with slight nausea and diarrhea.  Patient has not been exposed to any ill persons or had anything different to eat.  Encourage patient to increase hydration/electrolyte.  Start brat diet [bread, rice, applesauce, toast].  Imodium antidiarrhea,  Follow-up with worsening unresolved symptoms.      Relevant Medications   bismuth subsalicylate (PEPTO-BISMOL) 262 MG/15ML suspension     Meds ordered this encounter  Medications   predniSONE (STERAPRED UNI-PAK 21 TAB) 10 MG (21) TBPK tablet    Sig: 6 tablet day 1, 5 tablet day 2, 4 tablet day 3, 3 tablet day 4, 2 tablet day 5, 1 tablet day 6    Dispense:  1 each    Refill:  0    Order Specific Question:   Supervising Provider  AnswerJanora Norlander [id number]   bismuth subsalicylate (PEPTO-BISMOL) 262 MG/15ML suspension    Sig: Take 30 mLs by mouth every 6 (six) hours as needed.    Dispense:  360 mL    Refill:  0    Order Specific Question:   Supervising Provider    Answer:   [name] [id number]   acetaminophen (TYLENOL) 500 MG tablet    Sig: Take 1 tablet (500 mg total) by mouth every 6 (six) hours as needed.    Dispense:  30 tablet    Refill:  0    Order Specific Question:   Supervising Provider    Answer:   [name] [id number]   methylPREDNISolone acetate (DEPO-MEDROL) injection 80 mg     Leon Lynn, NP

## 2020-10-21 NOTE — Patient Instructions (Signed)
Joint Pain  Joint pain can be caused by many things. It is likely to go away if you follow instructions from your doctor for taking care of yourself at home. Sometimes,you may need more treatment. Follow these instructions at home: Managing pain, stiffness, and swelling     If told, put ice on the painful area. To do this: If you have a removable elastic bandage, sling, or splint, take it off as told by your doctor. Put ice in a plastic bag. Place a towel between your skin and the bag. Leave the ice on for 20 minutes, 2-3 times a day. Take off the ice if your skin turns bright red. This is very important. If you cannot feel pain, heat, or cold, you have a greater risk of damage to the area. Move your fingers or toes below the painful joint often. Raise the painful joint above the level of your heart while you are sitting or lying down. If told, put heat on the painful area. Do this as often as told by your doctor. Use the heat source that your doctor recommends, such as a moist heat pack or a heating pad. Place a towel between your skin and the heat source. Leave the heat on for 20-30 minutes. Take off the heat if your skin gets bright red. This is especially important if you are unable to feel pain, heat, or cold. You may have a greater risk of getting burned. Activity Rest the painful joint for as long as told by your doctor. Do not do things that cause pain or make your pain worse. Begin exercising or stretching the affected area, as told by your doctor. Ask your doctor what types of exercise are safe for you. Return to your normal activities when your doctor says that it is safe. If you have an elastic bandage, sling, or splint: Wear it as told by your doctor. Take it only as told by your doctor. Loosen it your fingers or toes below the joint: Tingle. Become numb. Get cold and blue. Keep it clean. Ask your doctor if you should take it off before bathing. If it is not  waterproof: Do not let it get wet. Cover it with a watertight covering when you take a bath or shower. General instructions Take over-the-counter and prescription medicines only as told by your doctor. This may include medicines taken by mouth or applied to the skin. Do not smoke or use any products that contain nicotine or tobacco. If you need help quitting, ask your doctor. Keep all follow-up visits as told by your doctor. This is important. Contact a doctor if: You have pain that gets worse and does not get better with medicine. Your joint pain does not get better in 3 days. You have more bruising or swelling. You have a fever. You lose 10 lb (4.5 kg) or more without trying. Get help right away if: You cannot move the joint. Your fingers or toes tingle, become numb. or get cold and blue. You have a fever along with a joint that is red, warm, and swollen. Summary Joint pain can be caused by many things. It often goes away if you follow instructions from your doctor for taking care of yourself at home. Rest the painful joint for as long as told. Do not do things that cause pain or make your pain worse. Take over-the-counter and prescription medicines only as told by your doctor. This information is not intended to replace advice given to you by  your health care provider. Make sure you discuss any questions you have with your healthcare provider. Document Revised: 06/06/2019 Document Reviewed: 06/06/2019 Elsevier Patient Education  2022 [employer].

## 2020-10-22 NOTE — Assessment & Plan Note (Signed)
Worsening joint pain in the last few weeks.  Patient is scheduled to see rheumatology December 04, 2020.  He missed his last appointment and was rescheduled for 2 months later.  Patient is not getting any relief from pain.  He describes his pain as aching and soreness in all joints of his body bilateral upper and bilateral lower extremities.  Patient is not taking any medication for symptom management.  I am starting patient on prednisone taper, Tylenol for pain, 80 Depo-Medrol shot given in clinic.  Please follow-up with rheumatology  And return with worsening or unresolved symptoms.

## 2020-10-22 NOTE — Assessment & Plan Note (Signed)
Unresolved abdominal pain with slight nausea and diarrhea.  Patient has not been exposed to any ill persons or had anything different to eat.  Encourage patient to increase hydration/electrolyte.  Start brat diet [bread, rice, applesauce, toast].  Imodium antidiarrhea,  Follow-up with worsening unresolved symptoms.

## 2020-11-20 ENCOUNTER — Ambulatory Visit: Payer: 59 | Admitting: Rheumatology

## 2020-11-20 NOTE — Progress Notes (Signed)
Office Visit Note  Patient: Leon Mason             Date of Birth: 30-Oct-1990           MRN: [id number]             PCP: [name], DO Referring: Vanessa Kick, MD Visit Date: 12/04/2020 Occupation: '@GUAROCC' @  Subjective:  Pain and swelling in multiple joints.   History of Present Illness: Leon Mason is a 30 y.o. male seen in consultation per request of [facility].  According the patient in March 2022 he developed left eye pain after which she was seen in the emergency room and then was referred to Dr. Clent Jacks.  He states he was treated with some topical steroid eyedrops.  He also had a blood test according the patient was positive for HLA-B27.  He had no recurrence of eye symptoms after that.  He states in May 2022 he started having pain and swelling in his left knee joint to the point he was having difficulty walking.  He also developed swelling in his left fourth finger, right hip and right ankle.  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  He was seen at an urgent care where he was given a prednisone taper for 2 weeks which helped but the symptoms recurred soon after the prednisone taper was over.  He was given another prednisone taper for 2 weeks.  He states he finished the prednisone taper about 3 weeks ago and now the joint pain and swelling has come back.  He currently has discomfort in his entire spine, left hand, right hip, left knee and right ankle.  He states he has had chronic constant pain in his entire spine since he was 30 years old.  He has seen chiropractors in the past and he received  massage therapy in the past.  He continues to have morning stiffness.  There is no history of plantar fasciitis or Achilles tendinitis. He denies any personal history of psoriasis.  He was hospitalized in February 2022 due to severe pneumonia and required antibiotics.  He states at the time he was told that he had prolonged  QT interval.  He believes that there are several family members on the maternal side may have psoriasis.    Activities of Daily Living:  Patient reports morning stiffness for several  hours.   Patient Reports nocturnal pain.  Difficulty dressing/grooming: Reports Difficulty climbing stairs: Reports Difficulty getting out of chair: Reports Difficulty using hands for taps, buttons, cutlery, and/or writing: Reports  Review of Systems  Constitutional:  Positive for fatigue.  HENT:  Negative for mouth sores, mouth dryness and nose dryness.   Eyes:  Positive for pain and visual disturbance. Negative for itching and dryness.  Respiratory:  Positive for shortness of breath and difficulty breathing.   Cardiovascular:  Negative for chest pain and palpitations.  Gastrointestinal:  Negative for blood in stool, constipation and diarrhea.  Endocrine: Negative for increased urination.  Genitourinary:  Negative for difficulty urinating.  Musculoskeletal:  Positive for joint pain, joint pain, joint swelling, myalgias, morning stiffness, muscle tenderness and myalgias.  Skin:  Negative for color change, rash and redness.  Allergic/Immunologic: Negative for susceptible to infections.  Neurological:  Positive for headaches, memory loss and weakness. Negative for dizziness and numbness.  Hematological:  Negative for bruising/bleeding tendency.  Psychiatric/Behavioral:  Positive for sleep disturbance. Negative for  confusion.    PMFS History:  Patient Active Problem List   Diagnosis Date Noted   Generalized joint pain 10/21/2020   Generalized abdominal pain 10/21/2020   Mild intermittent asthma with (acute) exacerbation 02/18/2019   Hypokalemia 02/18/2019   Normocytic anemia 02/18/2019   Elevated transaminase level 02/18/2019   Prolonged QT interval 02/18/2019   Viral syndrome 02/18/2019   SIRS (systemic inflammatory response syndrome) ([place]) 02/18/2019   Globus sensation 07/04/2018   Schizophrenia  ([place]) 03/28/2018   MDD (major depressive disorder), recurrent episode, severe ([place]) 03/28/2018   Schizo-affective schizophrenia, chronic condition with acute exacerbation ([place]) 03/28/2018   Tobacco use disorder 12/17/2016   Schizoaffective disorder, bipolar type ([place]) 08/23/2016   Alcohol use disorder, severe, dependence ([place]) 08/23/2016   Allergic rhinitis due to pollen 10/09/2015   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   Mild persistent allergic asthma 10/09/2015   Psychosis ([place]) 06/14/2013    Past Medical History:  Diagnosis Date   Anxiety    Asthma    Bipolar 1 disorder ([place])    Hallucinations    Schizoaffective disorder ([place])    Schizophrenia ([place])     Family History  Problem Relation Age of Onset   Bipolar disorder Mother    Alcoholism Mother    Diabetes Mother    Cervical cancer Mother    OCD Father    Bipolar disorder Father    Eczema Brother    Post-traumatic stress disorder Brother    COPD Maternal Aunt    Heart attack Maternal Grandmother    Breast cancer Maternal Grandmother    Heart attack Maternal Grandfather    Cancer Paternal Grandmother    Heart attack Paternal Grandfather    Asthma Neg Hx    Allergic rhinitis Neg Hx    Angioedema Neg Hx    Urticaria Neg Hx    Immunodeficiency Neg Hx    Atopy Neg Hx    Prostate cancer Neg Hx    Colon cancer Neg Hx    Past Surgical History:  Procedure Laterality Date   WISDOM TOOTH EXTRACTION  2009   all four   Social History   Social History Narrative   Not on file   Immunization History  Administered Date(s) Administered   DTaP 01/03/1990, 03/17/1990, 06/08/1990, 02/20/1991, 06/01/1994   Hepatitis B 01/05/2001, 02/01/2001, 05/04/2005   HiB (PRP-OMP) 01/03/1990, 03/17/1990, 06/08/1990, 02/20/1991   IPV 01/03/1990, 03/17/1990, 02/20/1991, 06/01/1994   Influenza,inj,Quad PF,6+ Mos 02/20/2019   Influenza,inj,quad, With Preservative 03/18/2018   MMR 02/20/1991, 06/01/1994   Meningococcal Conjugate 05/04/2005    Pneumococcal Polysaccharide-23 08/29/2016, 02/20/2019   Tdap 05/04/2005     Objective: Vital Signs: BP 134/83 (BP Location: Right Arm, Patient Position: Sitting, Cuff Size: Large)   Pulse 66   Ht '6\' 3"'  (1.905 m)   Wt 280 lb 3.2 oz (127.1 kg)   BMI 35.02 kg/m    Physical Exam Vitals and nursing note reviewed.  Constitutional:      Appearance: He is well-developed.  HENT:     Head: Normocephalic and atraumatic.  Eyes:     Conjunctiva/sclera: Conjunctivae normal.     Pupils: Pupils are equal, round, and reactive to light.  Cardiovascular:     Rate and Rhythm: Normal rate and regular rhythm.     Heart sounds: Normal heart sounds.  Pulmonary:     Effort: Pulmonary effort is normal.     Breath sounds: Normal breath sounds.  Abdominal:     General: Bowel sounds are normal.     Palpations:  Abdomen is soft.  Musculoskeletal:     Cervical back: Normal range of motion and neck supple.  Skin:    General: Skin is warm and dry.     Capillary Refill: Capillary refill takes less than 2 seconds.  Neurological:     Mental Status: He is alert and oriented to person, place, and time.  Psychiatric:        Behavior: Behavior normal.     Musculoskeletal Exam: He had painful range of motion of his cervical spine.  He had tenderness over entire thoracic and lumbar spine.  He had tenderness over SI joints.  Schober's was negative.  He had discomfort range of motion of his right shoulder joint.  Elbow joints and wrist joints with good range of motion.  He had contracture and synovitis in his left fourth PIP joint.  No nail dystrophy was noted.  No nail pitting was noted.  He had painful range of motion of his right hip joint.  He has warmth swelling and effusion in his left knee joint.  He had tenderness over right ankle joint below the lateral malleolus.  No synovitis was noted.  There was no evidence of Achilles tendinitis or plantar fasciitis.  CDAI Exam: CDAI Score: 6.6  Patient Global: 8 mm;  Provider Global: 8 mm Swollen: 2 ; Tender: 8  Joint Exam 12/04/2020      Right  Left  Glenohumeral   Tender     PIP 4     Swollen Tender  Cervical Spine   Tender     Thoracic Spine   Tender     Lumbar Spine   Tender     Hip   Tender     Knee     Swollen Tender  Ankle   Tender        Investigation: No additional findings.  Imaging: No results found.  Recent Labs: Lab Results  Component Value Date   WBC 8.6 02/19/2019   HGB 15.0 02/19/2019   PLT 191 02/19/2019   NA 139 02/19/2019   K 4.2 02/19/2019   CL 105 02/19/2019   CO2 23 02/19/2019   GLUCOSE 124 (H) 02/19/2019   BUN 11 02/19/2019   CREATININE 1.06 02/19/2019   BILITOT 1.0 02/18/2019   ALKPHOS 55 02/18/2019   AST 40 02/18/2019   ALT 65 (H) 02/18/2019   PROT 7.1 02/18/2019   ALBUMIN 4.0 02/18/2019   CALCIUM 9.7 02/19/2019   GFRAA >60 02/19/2019    Speciality Comments: No specialty comments available.  Procedures:  No procedures performed Allergies: Other, Dust mite extract, and Pollen extract   Assessment / Plan:     Visit Diagnoses: Iritis - OS, recurrent. [facility]. Referred to Dr. Manuella Ghazi.  Patient was treated with topical steroid eyedrops.  He states he did not see Dr. Manuella Ghazi has that he was not covered by his insurance.  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  He had tenderness over SI joints.  He has inflammatory arthritis and history of iritis.  He is HLA-B27 positive.  He has taken anti-inflammatories over the last several months to the point he was having GI discomfort.  He had been given to taper her prednisone which were helpful.  But the symptoms recurred after prednisone taper was finished.  He would benefit from Humira with a history of iritis, spondyloarthropathy and inflammatory arthritis.  Detailed counseling was provided.  Indications side effects contraindications were discussed.  We will apply for Humira.  Medication counseling:   TB Test:  Pending Hepatitis panel: Pending HIV: Pending SPEP: Pending Immunoglobulins: Pending  Chest x-ray: Pending  Does patient have diagnosis of heart failure?  No  Counseled patient that Humira is a TNF blocking agent.  Reviewed Humira dose of 40 mg every other week.  Counseled patient on purpose, proper use, and adverse effects of Humira.  Reviewed the most common adverse effects including infections, headache, and injection site reactions. Discussed that there is the possibility of an increased risk of malignancy but it is not well understood if this increased risk is due to the medication or the disease state.  Advised patient to get yearly dermatology exams due to risk of skin cancer.  Reviewed the importance of regular labs while on Humira therapy.  Advised patient to get standing labs one month after starting Humira then every 3 months.  Provided patient with standing lab orders.  Counseled patient that Humira should be held prior to scheduled surgery.  Counseled patient to avoid live vaccines while on Humira.  Advised patient to get annual influenza vaccine and the pneumococcal vaccine as needed.  Provided patient with medication education material and answered all questions.  Patient voiced understanding.  Patient consented to Humira.  Will upload consent into the media tab.  Reviewed storage instructions of Humira.  Advised initial injection must be administered in office.    Patient voiced understanding.     HLA B27 positive - 04/29/2020 by Dr. Katy Fitch  Pain in left hand -he had swelling and synovitis in his left fourth PIP joint.  Plan: XR Hand 2 View Left x-ray showed left fourth PIP severe narrowing.  Pain in right hip -a painful range of motion of his right hip joint.  Plan: XR HIP UNILAT W OR W/O PELVIS 2-3 VIEWS RIGHT.  X-ray of the hip joints were unremarkable.  No SI joint sclerosis or narrowing was noted.  Chronic SI joint pain -he had tenderness over bilateral SI joints.  Chronic  pain of left knee -he had warmth swelling and small effusion in his left knee joint.  He has been having difficulty walking due to left knee joint swelling.  Plan: XR KNEE 3 VIEW LEFT, x-ray showed moderate osteoarthritis and effusion.  Sedimentation rate, Rheumatoid factor, Cyclic citrul peptide antibody, IgG, ANA  Pain in right foot -he had discomfort over the lateral aspect of his right ankle.  There is no history of plantar fasciitis or Achilles tendinitis.  Plan: XR Foot 2 Views Right.  X-ray of the foot was unremarkable.  Neck pain -he gives history of neck pain since he was 30 years old.  He has seen a chiropractor in the past.  Plan: XR Cervical Spine 2 or 3 views.  X-rays were unremarkable.  Pain in thoracic spine -he had painful range of motion of thoracic spine.  He gives history of thoracic pain since he was 17.  Plan: XR Thoracic Spine 2 View.  X-rays were unremarkable.  No syndesmophytes or joint space narrowing was noted.  Chronic midline low back pain without sciatica -he gives history of chronic lower back pain since he was a teenager.  He has discomfort with range of motion and tenderness on palpation.  Plan: XR Lumbar Spine 2-3 Views.  X-rays were unremarkable.   High risk medication use -in anticipation to start her on immunosuppressive therapy will obtain following labs.  Plan: CBC with Differential/Platelet, COMPLETE METABOLIC PANEL WITH GFR, hepatitis B core antibody, IgM, Hepatitis B surface antigen, Hepatitis  C antibody, QuantiFERON-TB Gold Plus, Serum protein electrophoresis with reflex, IgG, IgA, IgM, HIV Antibody (routine testing w rflx)  Other fatigue - Plan: CK,   Prolonged QT interval-patient states he was told he had prolonged QT interval while he was hospitalized due to pleuritis and pneumonia.  Elevated transaminase level-his LFTs were elevated 2 years ago.  He drinks alcohol on a regular basis.  Abstinence from alcohol was discussed at length.  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 COVID-19 test was negative.  Smoker - 1PPDx 2years, quit 3 months ago. Vaping now.  Alcohol use disorder, severe, dependence ([place]) - 9 cans of beer per day.  Abstinence from alcohol was discussed.  Orders: Orders Placed This Encounter  Procedures   XR Hand 2 View Left   XR Foot 2 Views Right   XR KNEE 3 VIEW LEFT   XR HIP UNILAT W OR W/O PELVIS 2-3 VIEWS RIGHT   XR Pelvis 1-2 Views   XR Lumbar Spine 2-3 Views   XR Thoracic Spine 2 View   XR Cervical Spine 2 or 3 views   CBC with Differential/Platelet   COMPLETE METABOLIC PANEL WITH GFR   Sedimentation rate   CK   Rheumatoid factor   Cyclic citrul peptide antibody, IgG   ANA   Hepatitis B core antibody, IgM   Hepatitis B surface antigen   Hepatitis C antibody   QuantiFERON-TB Gold Plus   Serum protein electrophoresis with reflex   IgG, IgA, IgM   HIV Antibody (routine testing w rflx)    No orders of the defined types were placed in this encounter.   Face-to-face time spent with patient was 65 minutes.  Total time spent was 83 minutes.  Greater than 50% of time was spent in counseling and coordination of care.  Follow-Up Instructions: Return for Spondyloarthropathy.   Bo Merino, MD  Note - This record has been created using Editor, commissioning.  Chart creation errors have been sought, but may not always  have been located. Such creation errors do not reflect on  the standard of medical care.

## 2020-12-04 ENCOUNTER — Ambulatory Visit: Payer: Self-pay

## 2020-12-04 ENCOUNTER — Encounter: Payer: Self-pay | Admitting: Rheumatology

## 2020-12-04 ENCOUNTER — Telehealth: Payer: Self-pay | Admitting: Pharmacist

## 2020-12-04 ENCOUNTER — Ambulatory Visit (HOSPITAL_COMMUNITY)
Admission: RE | Admit: 2020-12-04 | Discharge: 2020-12-04 | Disposition: A | Discharge status: Home / Self Care | Payer: 59 | Source: Ambulatory Visit | Attending: Rheumatology | Admitting: Rheumatology

## 2020-12-04 ENCOUNTER — Ambulatory Visit (INDEPENDENT_AMBULATORY_CARE_PROVIDER_SITE_OTHER): Payer: 59 | Admitting: Rheumatology

## 2020-12-04 ENCOUNTER — Other Ambulatory Visit: Payer: Self-pay

## 2020-12-04 VITALS — BP 134/83 | HR 66 | Ht 75.0 in | Wt 280.2 lb

## 2020-12-04 DIAGNOSIS — L500 Allergic urticaria: Secondary | ICD-10-CM

## 2020-12-04 DIAGNOSIS — M79671 Pain in right foot: Secondary | ICD-10-CM | POA: Diagnosis not present

## 2020-12-04 DIAGNOSIS — F250 Schizoaffective disorder, bipolar type: Secondary | ICD-10-CM

## 2020-12-04 DIAGNOSIS — G8929 Other chronic pain: Secondary | ICD-10-CM | POA: Diagnosis not present

## 2020-12-04 DIAGNOSIS — M546 Pain in thoracic spine: Secondary | ICD-10-CM | POA: Diagnosis not present

## 2020-12-04 DIAGNOSIS — M79642 Pain in left hand: Secondary | ICD-10-CM | POA: Diagnosis not present

## 2020-12-04 DIAGNOSIS — R5383 Other fatigue: Secondary | ICD-10-CM

## 2020-12-04 DIAGNOSIS — F259 Schizoaffective disorder, unspecified: Secondary | ICD-10-CM

## 2020-12-04 DIAGNOSIS — M25562 Pain in left knee: Secondary | ICD-10-CM

## 2020-12-04 DIAGNOSIS — M25551 Pain in right hip: Secondary | ICD-10-CM | POA: Diagnosis not present

## 2020-12-04 DIAGNOSIS — Z79899 Other long term (current) drug therapy: Secondary | ICD-10-CM | POA: Diagnosis not present

## 2020-12-04 DIAGNOSIS — M47819 Spondylosis without myelopathy or radiculopathy, site unspecified: Secondary | ICD-10-CM

## 2020-12-04 DIAGNOSIS — R9431 Abnormal electrocardiogram [ECG] [EKG]: Secondary | ICD-10-CM

## 2020-12-04 DIAGNOSIS — Z1589 Genetic susceptibility to other disease: Secondary | ICD-10-CM | POA: Diagnosis not present

## 2020-12-04 DIAGNOSIS — F1020 Alcohol dependence, uncomplicated: Secondary | ICD-10-CM

## 2020-12-04 DIAGNOSIS — R6510 Systemic inflammatory response syndrome (SIRS) of non-infectious origin without acute organ dysfunction: Secondary | ICD-10-CM

## 2020-12-04 DIAGNOSIS — M542 Cervicalgia: Secondary | ICD-10-CM | POA: Diagnosis not present

## 2020-12-04 DIAGNOSIS — H209 Unspecified iridocyclitis: Secondary | ICD-10-CM

## 2020-12-04 DIAGNOSIS — M533 Sacrococcygeal disorders, not elsewhere classified: Secondary | ICD-10-CM

## 2020-12-04 DIAGNOSIS — F17200 Nicotine dependence, unspecified, uncomplicated: Secondary | ICD-10-CM

## 2020-12-04 DIAGNOSIS — R7401 Elevation of levels of liver transaminase levels: Secondary | ICD-10-CM

## 2020-12-04 DIAGNOSIS — M2550 Pain in unspecified joint: Secondary | ICD-10-CM

## 2020-12-04 DIAGNOSIS — M545 Low back pain, unspecified: Secondary | ICD-10-CM

## 2020-12-04 DIAGNOSIS — J4530 Mild persistent asthma, uncomplicated: Secondary | ICD-10-CM

## 2020-12-04 DIAGNOSIS — D649 Anemia, unspecified: Secondary | ICD-10-CM

## 2020-12-04 MED ORDER — PREDNISONE 5 MG PO TABS: ORAL_TABLET | ORAL | 0 refills | Status: DC

## 2020-12-04 NOTE — Telephone Encounter (Signed)
Please start Humira BIV.  Dose: 40mg  every 14 days  Dx: spondyloarthropathy (M47.819), can also use iritis as secondary (H20.9)  Knox Saliva, PharmD, MPH, BCPS Clinical Pharmacist (Rheumatology and Pulmonology)

## 2020-12-04 NOTE — Patient Instructions (Signed)
Please have a chest x-ray completed at [place] want to go to ENTR[facility] A, head to the front desk, and head to radiology. NO APPOINTMENT IS NEEDED.  We will investigate Humira approval through your insurance  Adalimumab Injection What is this medication? ADALIMUMAB (ay da LIM yoo mab) is used to treat rheumatoid and psoriatic arthritis. It is also used to treat ankylosing spondylitis, Crohn's disease, ulcerative colitis, plaque psoriasis, hidradenitis suppurativa, and uveitis. This medicine may be used for other purposes; ask your health care provider or pharmacist if you have questions. COMMON BRAND NAME(S): CYLTEZO, Humira What should I tell my care team before I take this medication? They need to know if you have any of these conditions: cancer diabetes (high blood sugar) having surgery heart disease hepatitis B immune system problems infections, such as tuberculosis (TB) or other bacterial, fungal, or viral infections multiple sclerosis recent or upcoming vaccine an unusual reaction to adalimumab, mannitol, latex, rubber, other medicines, foods, dyes, or preservatives pregnant or trying to get pregnant breast-feeding How should I use this medication? This medicine is for injection under the skin. You will be taught how to prepare and give it. Take it as directed on the prescription label. Keep taking it unless your health care provider tells you to stop. It is important that you put your used needles and syringes in a special sharps container. Do not put them in a trash can. If you do not have a sharps container, call your pharmacist or health care provider to get one. This medicine comes with INSTRUCTIONS FOR USE. Ask your pharmacist for directions on how to use this medicine. Read the information carefully. Talk to your pharmacist or health care provider if you have questions. A special MedGuide will be given to you by the pharmacist with each prescription and refill.  Be sure to read this information carefully each time. Talk to your pediatrician regarding the use of this medicine in children. While this drug may be prescribed for children as young as 2 years for selected conditions, precautions do apply. Overdosage: If you think you have taken too much of this medicine contact a poison control center or emergency room at once. NOTE: This medicine is only for you. Do not share this medicine with others. What if I miss a dose? If you miss a dose, take it as soon as you can. If it is almost time for your next dose, take only that dose. Do not take double or extra doses. It is important not to miss any doses. Talk to your health care provider about what to do if you miss a dose. What may interact with this medication? Do not take this medicine with any of the following medications: abatacept anakinra biologic medicines such as certolizumab, etanercept, golimumab, infliximab live virus vaccines This medicine may also interact with the following medications: cyclosporine theophylline vaccines warfarin This list may not describe all possible interactions. Give your health care provider a list of all the medicines, herbs, non-prescription drugs, or dietary supplements you use. Also tell them if you smoke, drink alcohol, or use illegal drugs. Some items may interact with your medicine. What should I watch for while using this medication? Visit your health care provider for regular checks on your progress. Tell your health care provider if your symptoms do not start to get better or if they get worse. You will be tested for tuberculosis (TB) before you start this medicine. If your doctor prescribes any medicine for TB,  you should start taking the TB medicine before starting this medicine. Make sure to finish the full course of TB medicine. This medicine may increase your risk of getting an infection. Call your health care provider for advice if you get a fever,  chills, sore throat, or other symptoms of a cold or flu. Do not treat yourself. Try to avoid being around people who are sick. Talk to your health care provider about your risk of cancer. You may be more at risk for certain types of cancer if you take this medicine. What side effects may I notice from receiving this medication? Side effects that you should report to your doctor or health care professional as soon as possible: allergic reactions like skin rash, itching or hives, swelling of the face, lips, or tongue changes in vision chest pain dizziness heart failure (trouble breathing; fast, irregular heartbeat; sudden weight gain; swelling of the ankles, feet, hands; unusually weak or tired) infection (fever, chills, cough, sore throat, pain or trouble passing urine) liver injury (dark yellow or brown urine; general ill feeling or flu-like symptoms; loss of appetite, right upper belly pain; unusually weak or tired, yellowing of the eyes or skin) lump or swollen lymph nodes on the neck, groin, or underarm area muscle weakness pain, tingling, numbness in the hands or feet red, scaly patches or raised bumps on the skin trouble breathing unusual bleeding or bruising unusually weak or tired Side effects that usually do not require medical attention (report to your doctor or health care professional if they continue or are bothersome): headache nausea pain, redness, or irritation at site where injected stuffy or runny nose This list may not describe all possible side effects. Call your doctor for medical advice about side effects. You may report side effects to FDA at 1-800-FDA-1088. Where should I keep my medication? Keep out of the reach of children and pets. Store in the refrigerator between 2 and 8 degrees C (36 and 46 degrees F). Do not freeze. Keep this medicine in the original packaging until you are ready to take it. Protect from light. Get rid of any unused medicine after the  expiration date. This medicine may be stored at room temperature for up to 14 days. Keep this medicine in the original packaging. Protect from light. If it is stored at room temperature, get rid of any unused medicine after 14 days or after it expires, whichever is first. To get rid of medicines that are no longer needed or have expired: Take the medicine to a medicine take-back program. Check with your pharmacy or law enforcement to find a location. If you cannot return the medicine, ask your pharmacist or health care provider how to get rid of this medicine safely. NOTE: This sheet is a summary. It may not cover all possible information. If you have questions about this medicine, talk to your doctor, pharmacist, or health care provider.  2022 Elsevier/Gold Standard (2019-05-03 17:28:40)

## 2020-12-04 NOTE — Progress Notes (Signed)
Pharmacy Note Subjective: Patient presents today to [facility] for follow up office visit. 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  He is naive to therapy.  Diagnosis of heart failure: No  Objective:  CBC    Component Value Date/Time   WBC 8.6 02/19/2019 0602   RBC 4.78 02/19/2019 0602   HGB 15.0 02/19/2019 0602   HCT 44.7 02/19/2019 0602   PLT 191 02/19/2019 0602   MCV 93.5 02/19/2019 0602   MCH 31.4 02/19/2019 0602   MCHC 33.6 02/19/2019 0602   RDW 13.2 02/19/2019 0602   LYMPHSABS 1.0 02/19/2019 0602   MONOABS 0.4 02/19/2019 0602   EOSABS 0.0 02/19/2019 0602   BASOSABS 0.0 02/19/2019 0602     CMP     Component Value Date/Time   NA 139 02/19/2019 0602   K 4.2 02/19/2019 0602   CL 105 02/19/2019 0602   CO2 23 02/19/2019 0602   GLUCOSE 124 (H) 02/19/2019 0602   BUN 11 02/19/2019 0602   CREATININE 1.06 02/19/2019 0602   CALCIUM 9.7 02/19/2019 0602   PROT 7.1 02/18/2019 0412   ALBUMIN 4.0 02/18/2019 0412   AST 40 02/18/2019 0412   ALT 65 (H) 02/18/2019 0412   ALKPHOS 55 02/18/2019 0412   BILITOT 1.0 02/18/2019 0412   GFRNONAA >60 02/19/2019 0602   GFRAA >60 02/19/2019 0602      Baseline Immunosuppressant Therapy Labs TB GOLD   Hepatitis Panel   HIV Lab Results  Component Value Date   HIV NON REACTIVE 02/19/2019   HIV Non Reactive 08/24/2016   Immunoglobulins   SPEP Serum Protein Electrophoresis Latest Ref Rng & Units 02/18/2019  Total Protein 6.5 - 8.1 g/dL 7.1   Chest x-ray: repeat today  Assessment/Plan:  Counseled patient that Humira is a TNF blocking agent.  Counseled patient on purpose, proper use, and adverse effects of Humira.  Reviewed the most common adverse effects including infections, headache, and injection site reactions. Discussed that there is the possibility of an increased risk of malignancy including non-melanoma skin cancer but it is not well understood if  this increased risk is due to the medication or the disease state.  Advised patient to get yearly dermatology exams due to risk of skin cancer. Counseled patient that Humira should be held prior to scheduled surgery.  Counseled patient to avoid live vaccines while on Humira.  Recommend annual influenza, PCV 15 or PCV20 or Pneumovax 23, and Shingrix as indicated.  Reviewed the importance of regular labs while on Humira therapy. Will monitor CBC and CMP 1 month after starting and then every 3 months routinely thereafter. Will monitor TB gold annually. Standing orders placed. Provided patient with medication education material and answered all questions.  Patient consented to Humira.  Will upload consent into the media tab.  Reviewed storage instructions of Humira.  Advised initial injection must be administered in office.  Patient verbalized understanding.   Dermatology referral will be placed at new start visit.  Dose will be for ankylosing spondylitis Humira 40 mg every 14 days.  Prescription pending lab results and/or insurance approval.  Knox Saliva, PharmD, MPH, BCPS Clinical Pharmacist (Rheumatology and Pulmonology)

## 2020-12-04 NOTE — Telephone Encounter (Signed)
Submitted a Prior Authorization request to [facility] for HUMIRA via CoverMyMeds. Will update once we receive a response.   KeyMarden Noble - PA Case ID: [id number]  Patient added to Complete Pro portal- awaiting BIV for copay card details

## 2020-12-06 NOTE — Progress Notes (Signed)
CXR is normal

## 2020-12-08 ENCOUNTER — Other Ambulatory Visit (HOSPITAL_COMMUNITY): Payer: Self-pay

## 2020-12-08 NOTE — Telephone Encounter (Signed)
Copay card details still pending in Complete Pro portal

## 2020-12-08 NOTE — Telephone Encounter (Signed)
Received notification from [facility] regarding a prior authorization for HUMIRA. Authorization has been APPROVED from 12/05/20 to 06/03/21.   Unable to run test claim as patient has to pay his premium.  Patient must fill through [employer]: [phone]  Authorization # [id number] Phone # [phone]  Results of baseline immunosuppressive labs still pending.  Knox Saliva, PharmD, MPH, BCPS Clinical Pharmacist (Rheumatology and Pulmonology)

## 2020-12-09 LAB — CBC WITH DIFFERENTIAL/PLATELET
Absolute Monocytes: 482 cells/uL (ref 200–950)
Basophils Absolute: 71 cells/uL (ref 0–200)
Basophils Relative: 0.9 %
Eosinophils Absolute: 284 cells/uL (ref 15–500)
Eosinophils Relative: 3.6 %
HCT: 49.3 % (ref 38.5–50.0)
Hemoglobin: 16.6 g/dL (ref 13.2–17.1)
Lymphs Abs: 2346 cells/uL (ref 850–3900)
MCH: 30 pg (ref 27.0–33.0)
MCHC: 33.7 g/dL (ref 32.0–36.0)
MCV: 89.2 fL (ref 80.0–100.0)
MPV: 11.7 fL (ref 7.5–12.5)
Monocytes Relative: 6.1 %
Neutro Abs: 4716 cells/uL (ref 1500–7800)
Neutrophils Relative %: 59.7 %
Platelets: 238 10*3/uL (ref 140–400)
RBC: 5.53 10*6/uL (ref 4.20–5.80)
RDW: 15.3 % — ABNORMAL HIGH (ref 11.0–15.0)
Total Lymphocyte: 29.7 %
WBC: 7.9 10*3/uL (ref 3.8–10.8)

## 2020-12-09 LAB — COMPLETE METABOLIC PANEL WITH GFR
AG Ratio: 1.6 (calc) (ref 1.0–2.5)
ALT: 48 U/L — ABNORMAL HIGH (ref 9–46)
AST: 33 U/L (ref 10–40)
Albumin: 4.9 g/dL (ref 3.6–5.1)
Alkaline phosphatase (APISO): 84 U/L (ref 36–130)
BUN: 11 mg/dL (ref 7–25)
CO2: 18 mmol/L — ABNORMAL LOW (ref 20–32)
Calcium: 10.1 mg/dL (ref 8.6–10.3)
Chloride: 104 mmol/L (ref 98–110)
Creat: 0.77 mg/dL (ref 0.60–1.26)
Globulin: 3 g/dL (calc) (ref 1.9–3.7)
Glucose, Bld: 75 mg/dL (ref 65–99)
Potassium: 4.1 mmol/L (ref 3.5–5.3)
Sodium: 141 mmol/L (ref 135–146)
Total Bilirubin: 0.5 mg/dL (ref 0.2–1.2)
Total Protein: 7.9 g/dL (ref 6.1–8.1)
eGFR: 123 mL/min/{1.73_m2} (ref >=60)

## 2020-12-09 LAB — PROTEIN ELECTROPHORESIS, SERUM, WITH REFLEX
Albumin ELP: 4.8 g/dL (ref 3.8–4.8)
Alpha 1: 0.3 g/dL (ref 0.2–0.3)
Alpha 2: 0.8 g/dL (ref 0.5–0.9)
Beta 2: 0.3 g/dL (ref 0.2–0.5)
Beta Globulin: 0.5 g/dL (ref 0.4–0.6)
Gamma Globulin: 1.1 g/dL (ref 0.8–1.7)
Total Protein: 7.9 g/dL (ref 6.1–8.1)

## 2020-12-09 LAB — CYCLIC CITRUL PEPTIDE ANTIBODY, IGG: Cyclic Citrullin Peptide Ab: <16 UNITS

## 2020-12-09 LAB — RHEUMATOID FACTOR: Rheumatoid fact SerPl-aCnc: <14 IU/mL (ref <14)

## 2020-12-09 LAB — IGG, IGA, IGM
IgG (Immunoglobin G), Serum: 1304 mg/dL (ref 600–1640)
IgM, Serum: 90 mg/dL (ref 50–300)
Immunoglobulin A: 221 mg/dL (ref 47–310)

## 2020-12-09 LAB — QUANTIFERON-TB GOLD PLUS
Mitogen-NIL: >10 IU/mL
NIL: 0.04 IU/mL
QuantiFERON-TB Gold Plus: NEGATIVE
TB1-NIL: 0 IU/mL
TB2-NIL: <0 IU/mL

## 2020-12-09 LAB — HIV ANTIBODY (ROUTINE TESTING W REFLEX): HIV 1&2 Ab, 4th Generation: NONREACTIVE

## 2020-12-09 LAB — HEPATITIS C ANTIBODY
Hepatitis C Ab: NONREACTIVE
SIGNAL TO CUT-OFF: 0.01 (ref <1.00)

## 2020-12-09 LAB — ANA: Anti Nuclear Antibody (ANA): NEGATIVE

## 2020-12-09 LAB — HEPATITIS B SURFACE ANTIGEN: Hepatitis B Surface Ag: NONREACTIVE

## 2020-12-09 LAB — SEDIMENTATION RATE: Sed Rate: 2 mm/h (ref 0–15)

## 2020-12-09 LAB — HEPATITIS B CORE ANTIBODY, IGM: Hep B C IgM: NONREACTIVE

## 2020-12-09 LAB — CK: Total CK: 104 U/L (ref 44–196)

## 2020-12-09 NOTE — Progress Notes (Signed)
CBC is normal, ALT is mildly elevated, sed rate is 2, anti-CCP negative, ANA negative, hepatitis B negative, hepatitis C negative, TB Gold negative, SPEP normal, immunoglobulins normal, HIV negative.  Humira approval pending.

## 2020-12-11 ENCOUNTER — Encounter: Payer: Self-pay | Admitting: *Deleted

## 2020-12-11 ENCOUNTER — Other Ambulatory Visit (HOSPITAL_COMMUNITY): Payer: Self-pay

## 2020-12-11 NOTE — Telephone Encounter (Signed)
Called patient about Humira approval. Test claim still unable to be processed. Rejection states that patient has not yet paid premium. Unable to reach patient. Left VM detailing that he must pay premium for insurance to pay towards Humira. Advised him to go ahead and do so ASAP and reach back out to me today if he's able to  Knox Saliva, PharmD, MPH, BCPS Clinical Pharmacist (Rheumatology and Pulmonology)

## 2020-12-12 ENCOUNTER — Other Ambulatory Visit (HOSPITAL_COMMUNITY): Payer: Self-pay

## 2020-12-12 NOTE — Telephone Encounter (Signed)
HUMIRA Complete Savings Card  Rx ID- [id number] Rx GROUP- [id number] Rx BIN- [id number] Rx PCN- [id number]  Still unable to process claim: PATIENT NOT ELIGIBLE DUE TO NON PAYMENT OF PREMIUM. PATIENT TO CONTACT [employer]MUST PROCESS THROUGH [employer]. CONTACT [phone]

## 2020-12-13 NOTE — Progress Notes (Signed)
Office Visit Note  Patient: Leon Mason             Date of Birth: 1990/08/10           MRN: [id number]             PCP: [name], DO Referring: [name], DO Visit Date: 12/26/2020 Occupation: '@GUAROCC' @  Subjective:  Left knee pain.   History of Present Illness: Leon Mason is a 30 y.o. male with HLA-B27 positive spondyloarthropathy.  He continues to have pain and discomfort in his neck, thoracic region and SI joints.  He has contractures in his left ring finger which is not causing much discomfort today.  He continues to have pain and discomfort in his left knee joint.  He has ongoing iritis.  He states he quit drinking about a week ago.  Finished her prednisone taper.  He noticed improvement while he was on the prednisone taper.  Activities of Daily Living:  Patient reports morning stiffness for 30 minutes.   Patient Denies nocturnal pain.  Difficulty dressing/grooming: Denies Difficulty climbing stairs: Reports Difficulty getting out of chair: Reports Difficulty using hands for taps, buttons, cutlery, and/or writing: Reports  Review of Systems  Constitutional:  Positive for fatigue. Negative for night sweats.  HENT:  Negative for mouth sores, mouth dryness and nose dryness.   Eyes:  Positive for pain and redness. Negative for dryness.  Respiratory:  Positive for shortness of breath. Negative for difficulty breathing.        History of asthma  Cardiovascular:  Negative for chest pain, palpitations, hypertension, irregular heartbeat and swelling in legs/feet.  Gastrointestinal:  Negative for constipation and diarrhea.  Endocrine: Negative for increased urination.  Musculoskeletal:  Positive for joint pain, joint pain, joint swelling and morning stiffness. Negative for myalgias, muscle weakness, muscle tenderness and myalgias.  Skin:  Negative for color change, rash, hair loss, nodules/bumps, skin tightness, ulcers and sensitivity to sunlight.   Allergic/Immunologic: Negative for susceptible to infections.  Neurological:  Positive for headaches. Negative for dizziness, fainting, memory loss, night sweats and weakness ( ).  Hematological:  Negative for swollen glands.  Psychiatric/Behavioral:  Positive for depressed mood and sleep disturbance. The patient is nervous/anxious.    PMFS History:  Patient Active Problem List   Diagnosis Date Noted   Generalized joint pain 10/21/2020   Generalized abdominal pain 10/21/2020   Mild intermittent asthma with (acute) exacerbation 02/18/2019   Hypokalemia 02/18/2019   Normocytic anemia 02/18/2019   Elevated transaminase level 02/18/2019   Prolonged QT interval 02/18/2019   Viral syndrome 02/18/2019   SIRS (systemic inflammatory response syndrome) ([place]) 02/18/2019   Globus sensation 07/04/2018   Schizophrenia ([place]) 03/28/2018   MDD (major depressive disorder), recurrent episode, severe ([place]) 03/28/2018   Schizo-affective schizophrenia, chronic condition with acute exacerbation ([place]) 03/28/2018   Tobacco use disorder 12/17/2016   Schizoaffective disorder, bipolar type ([place]) 08/23/2016   Alcohol use disorder, severe, dependence (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   Mild persistent allergic asthma 10/09/2015   Psychosis ([place]) 06/14/2013    Past Medical History:  Diagnosis Date   Anxiety    Asthma    Bipolar 1 disorder ([place])    Hallucinations    Schizoaffective disorder ([place])    Schizophrenia ([place])     Family History  Problem Relation Age of Onset   Bipolar disorder Mother    Alcoholism Mother    Diabetes Mother  Cervical cancer Mother    OCD Father    Bipolar disorder Father    Eczema Brother    Post-traumatic stress disorder Brother    COPD Maternal Aunt    Heart attack Maternal Grandmother    Breast cancer Maternal Grandmother    Heart attack Maternal Grandfather    Cancer Paternal Grandmother    Heart attack  Paternal Grandfather    Asthma Neg Hx    Allergic rhinitis Neg Hx    Angioedema Neg Hx    Urticaria Neg Hx    Immunodeficiency Neg Hx    Atopy Neg Hx    Prostate cancer Neg Hx    Colon cancer Neg Hx    Past Surgical History:  Procedure Laterality Date   WISDOM TOOTH EXTRACTION  2009   all four   Social History   Social History Narrative   Not on file   Immunization History  Administered Date(s) Administered   DTaP 01/03/1990, 03/17/1990, 06/08/1990, 02/20/1991, 06/01/1994   Hepatitis B 01/05/2001, 02/01/2001, 05/04/2005   HiB (PRP-OMP) 01/03/1990, 03/17/1990, 06/08/1990, 02/20/1991   IPV 01/03/1990, 03/17/1990, 02/20/1991, 06/01/1994   Influenza,inj,Quad PF,6+ Mos 02/20/2019   Influenza,inj,quad, With Preservative 03/18/2018   MMR 02/20/1991, 06/01/1994   Meningococcal Conjugate 05/04/2005   Pneumococcal Polysaccharide-23 08/29/2016, 02/20/2019   Tdap 05/04/2005     Objective: Vital Signs: BP 123/78 (BP Location: Right Arm, Patient Position: Sitting, Cuff Size: Large)   Pulse (!) 54   Resp 15   Ht '6\' 3"'  (1.905 m)   Wt 291 lb (132 kg)   BMI 36.37 kg/m    Physical Exam Vitals and nursing note reviewed.  Constitutional:      Appearance: He is well-developed.  HENT:     Head: Normocephalic and atraumatic.  Eyes:     Conjunctiva/sclera: Conjunctivae normal.     Pupils: Pupils are equal, round, and reactive to light.  Cardiovascular:     Rate and Rhythm: Normal rate and regular rhythm.     Heart sounds: Normal heart sounds.  Pulmonary:     Effort: Pulmonary effort is normal.     Breath sounds: Normal breath sounds.  Abdominal:     General: Bowel sounds are normal.     Palpations: Abdomen is soft.  Musculoskeletal:     Cervical back: Normal range of motion and neck supple.  Skin:    General: Skin is warm and dry.     Capillary Refill: Capillary refill takes less than 2 seconds.  Neurological:     Mental Status: He is alert and oriented to person, place, and  time.  Psychiatric:        Behavior: Behavior normal.     Musculoskeletal Exam: He had pain and stiffness range of motion of his cervical spine and thoracic spine.  He tenderness over SI joints.  Shoulder joints, elbow joints, wrist joints with good range of motion.  He has synovitis and contracture of his left fourth PIP.  Hip joints and knee joints in good range of motion.  He warmth and swelling in left knee joint.  There was no tenderness over ankles.  There was no evidence of Achilles tendinitis or plantar fasciitis.  CDAI Exam: CDAI Score: 5.2  Patient Global: 6 mm; Provider Global: 6 mm Swollen: 2 ; Tender: 7  Joint Exam 12/26/2020      Right  Left  PIP 4     Swollen Tender  Cervical Spine   Tender     Thoracic Spine   Tender  Lumbar Spine   Tender     Sacroiliac   Tender   Tender  Knee     Swollen Tender     Investigation: No additional findings.  Imaging: DG Chest 2 View  Result Date: 12/06/2020 CLINICAL DATA:  High risk medication use. EXAM: CHEST - 2 VIEW COMPARISON:  06/24/2020 FINDINGS: The heart size and mediastinal contours are within normal limits. Both lungs are clear. The visualized skeletal structures are unremarkable. IMPRESSION: No active cardiopulmonary disease. Electronically Signed   By: Kathreen Devoid M.D.   On: 12/06/2020 09:10   XR HIP UNILAT W OR W/O PELVIS 2-3 VIEWS RIGHT  Result Date: 12/04/2020 No hip joint narrowing was noted.  No SI joint sclerosis or narrowing was noted.  No erosive changes in the SI joints were noted. Impression: Unremarkable x-ray of the hip joint and SI joints.  XR Thoracic Spine 2 View  Result Date: 12/04/2020 No disc space narrowing was noted.  No syndesmophytes were noted. Impression:  Unremarkable x-ray of thoracic spine.  XR Cervical Spine 2 or 3 views  Result Date: 12/04/2020 No disc space narrowing was noted.  No syndesmophytes were noted.  No facet joint arthropathy was noted. Impression: Unremarkable x-ray of the  cervical spine.  XR Foot 2 Views Right  Result Date: 12/04/2020 No MTP, PIP or DIP narrowing was noted.  No tibiotalar, or subtalar narrowing was noted.  No erosive changes were noted. Impression: Unremarkable x-ray of the foot.  XR Hand 2 View Left  Result Date: 12/04/2020 No CMC, MCP, intercarpal or radiocarpal joint space narrowing was noted.  Right fourth PIP severe narrowing without any erosive changes was noted.  No DIP narrowing was noted. Impression: These findings are consistent with inflammatory arthritis.  XR KNEE 3 VIEW LEFT  Result Date: 12/04/2020 Moderate medial compartment narrowing was noted.  No patellofemoral narrowing was noted.  Effusion was noted. Impression: These findings are consistent with moderate osteoarthritis and inflammatory arthritis.  XR Lumbar Spine 2-3 Views  Result Date: 12/04/2020 No significant disc space narrowing was noted.  No facet joint arthropathy was noted.  No syndesmophytes were noted. Impression: Unremarkable x-ray of the lumbar spine.   Recent Labs: Lab Results  Component Value Date   WBC 7.9 12/04/2020   HGB 16.6 12/04/2020   PLT 238 12/04/2020   NA 141 12/04/2020   K 4.1 12/04/2020   CL 104 12/04/2020   CO2 18 (L) 12/04/2020   GLUCOSE 75 12/04/2020   BUN 11 12/04/2020   CREATININE 0.77 12/04/2020   BILITOT 0.5 12/04/2020   ALKPHOS 55 02/18/2019   AST 33 12/04/2020   ALT 48 (H) 12/04/2020   PROT 7.9 12/04/2020   PROT 7.9 12/04/2020   ALBUMIN 4.0 02/18/2019   CALCIUM 10.1 12/04/2020   GFRAA >60 02/19/2019   QFTBGOLDPLUS NEGATIVE 12/04/2020   December 04, 2020 SPEP negative, immunoglobulins normal, TB Gold negative, hepatitis B-, hepatitis C negative, HIV negative, ESR 2, CK104, RF negative, anti-CCP negative, ANA negative  Speciality Comments: Humira started December 26, 2020  Procedures:  Large Joint Inj: L knee on 12/26/2020 9:19 AM Indications: pain Details: 27 G 1.5 in needle, medial approach  Arthrogram:  No  Medications: 40 mg triamcinolone acetonide 40 MG/ML; 1.5 mL lidocaine 1 % Aspirate: 0 mL Outcome: tolerated well, no immediate complications Procedure, treatment alternatives, risks and benefits explained, specific risks discussed. Consent was given by the patient. Immediately prior to procedure a time out was called to verify the correct patient, procedure, equipment,  support staff and site/side marked as required. Patient was prepped and draped in the usual sterile fashion.    Allergies: Other, Dust mite extract, and Pollen extract   Assessment / Plan:     Visit Diagnoses: Spondyloarthropathy - Inflammatory arthritis, iritis, chronic SI joint pain, chronic pain in neck, thoracic and lumbar region.HLA-B27 positive. Inadequate response to NSAIDs and GI side effects.  He had a prednisone taper after the last visit which helped him.  He continues to have pain and discomfort in his left knee joint and has difficulty walking.  He complains of muscle spasms in his left lower extremity due to knee joint pain.  Neck and thoracic pain discomfort persist.  He has noticed improvement in the swelling in his left ring finger.  He had detailed discussion regarding starting on Humira at the last visit.  Side effects were discussed and consent was obtained at the last visit.  He is ready to start on Humira.  Humira has been approved.  Side effects reviewed again reviewed.  He was given the first shot of Humira in the office started.  He was observed in the office for 30 minutes and no adverse events were observed.  A prescription for Humira was sent.  High risk medication use -patient was given his first Humira injection in the office today.  He tolerated the procedure well.  He will be taking Humira 40 mg subcu every other week at home now.  He was advised to get labs in a month after starting Humira and then every 3 months to monitor for drug toxicity.  He was also advised to stop Humira in case he develops an  infection and restart after the infection resolves.  Updated information regarding realization was also placed in the AVS.  He was advised to have a skin examination once a year by the dermatologist to screen for nonmelanoma skin cancer.  Iritis-he had bilateral conjunctival injection and ongoing irritation in his eyes.  HLA B27 positive  Pain in left hand - Left fifth PIP swelling and synovitis noted.  X-ray showed PIP joint narrowing.  X-ray findings were discussed.  The swelling has decreased but he has a contracture in his left fifth PIP now.  Chronic pain of left knee - Warmth, swelling and effusion was noted.  Moderate osteoarthritis was noted on the x-ray.  He has been having a lot of difficulty walking.  After informed consent was obtained and different treatment options were discussed left knee joint was injected with cortisone as described above.  He tolerated the procedure well.  Pain in right foot - Tenderness over right ankle.  There is no history of Achilles tendinitis or plantar fasciitis.  Neck pain - History of neck pain since he was a teenager.  X-rays were unremarkable.  X-ray findings were discussed.  He continues to have back pain.  Pain in thoracic spine - History of chronic thoracic pain.  He continues to have thoracic pain.  Chronic midline low back pain without sciatica - History of chronic back pain since he was a teenager.  X-rays were unremarkable.  X-ray findings were discussed.  Chronic SI joint pain-he continues to have tenderness over SI joints.  Elevated transaminase level - Chronic elevation of LFTs due to alcohol use.  LFTs are improving and he is quit drinking.  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  Mild persistent allergic asthma  Schizoaffective disorder, bipolar type ([facility])  SIRS (systemic inflammatory response syndrome) ([place]) -  February 2022 after pneumonia.  Smoker - He is vaping now.  He smoked 1 pack/day for 2 years and  quit smoking in June 2022.  Alcohol use disorder, severe, dependence ([place]) - He gives history of drinking 9 cans of beer per day.He quit drinking a week ago.  Orders: Orders Placed This Encounter  Procedures   Large Joint Inj: L knee    Meds ordered this encounter  Medications   Adalimumab PSKT 40 mg   Adalimumab (HUMIRA PEN) 40 MG/0.4ML PNKT    Sig: Inject 40 mg into the skin every 14 (fourteen) days.    Dispense:  3 each    Refill:  0    3 kits-6 pens     Follow-Up Instructions: Return in about 6 weeks (around 02/06/2021) for Spondyloarthropathy.   Bo Merino, MD  Note - This record has been created using Editor, commissioning.  Chart creation errors have been sought, but may not always  have been located. Such creation errors do not reflect on  the standard of medical care.

## 2020-12-16 NOTE — Telephone Encounter (Signed)
ATC patient to review his need to pay premium prior to starting Humira. Unable to reach. Left VM. It seems that clinical team has also been unable to reach patient to advise on labs  Knox Saliva, PharmD, MPH, BCPS Clinical Pharmacist (Rheumatology and Pulmonology)

## 2020-12-24 NOTE — Telephone Encounter (Addendum)
ATC #3 patient to review Humira approval. Unable to reach but left VM requesting return call to discuss  He has f/u appt with Dr. Estanislado Pandy on 12/26/20 and could start Humira at that visit, however patient needs to pay his insurance premium to continue have medication filled through [facility], PharmD, MPH, BCPS Clinical Pharmacist (Rheumatology and Pulmonology)

## 2020-12-26 ENCOUNTER — Other Ambulatory Visit: Payer: Self-pay

## 2020-12-26 ENCOUNTER — Encounter: Payer: Self-pay | Admitting: Rheumatology

## 2020-12-26 ENCOUNTER — Ambulatory Visit (INDEPENDENT_AMBULATORY_CARE_PROVIDER_SITE_OTHER): Payer: 59 | Admitting: Rheumatology

## 2020-12-26 VITALS — BP 123/78 | HR 54 | Resp 15 | Ht 75.0 in | Wt 291.0 lb

## 2020-12-26 DIAGNOSIS — R7401 Elevation of levels of liver transaminase levels: Secondary | ICD-10-CM

## 2020-12-26 DIAGNOSIS — R9431 Abnormal electrocardiogram [ECG] [EKG]: Secondary | ICD-10-CM

## 2020-12-26 DIAGNOSIS — G8929 Other chronic pain: Secondary | ICD-10-CM

## 2020-12-26 DIAGNOSIS — M25562 Pain in left knee: Secondary | ICD-10-CM | POA: Diagnosis not present

## 2020-12-26 DIAGNOSIS — R6510 Systemic inflammatory response syndrome (SIRS) of non-infectious origin without acute organ dysfunction: Secondary | ICD-10-CM

## 2020-12-26 DIAGNOSIS — Z79899 Other long term (current) drug therapy: Secondary | ICD-10-CM | POA: Diagnosis not present

## 2020-12-26 DIAGNOSIS — H209 Unspecified iridocyclitis: Secondary | ICD-10-CM

## 2020-12-26 DIAGNOSIS — M546 Pain in thoracic spine: Secondary | ICD-10-CM

## 2020-12-26 DIAGNOSIS — M533 Sacrococcygeal disorders, not elsewhere classified: Secondary | ICD-10-CM

## 2020-12-26 DIAGNOSIS — M79671 Pain in right foot: Secondary | ICD-10-CM

## 2020-12-26 DIAGNOSIS — L500 Allergic urticaria: Secondary | ICD-10-CM

## 2020-12-26 DIAGNOSIS — M47819 Spondylosis without myelopathy or radiculopathy, site unspecified: Secondary | ICD-10-CM | POA: Diagnosis not present

## 2020-12-26 DIAGNOSIS — Z1589 Genetic susceptibility to other disease: Secondary | ICD-10-CM

## 2020-12-26 DIAGNOSIS — F250 Schizoaffective disorder, bipolar type: Secondary | ICD-10-CM

## 2020-12-26 DIAGNOSIS — J4530 Mild persistent asthma, uncomplicated: Secondary | ICD-10-CM

## 2020-12-26 DIAGNOSIS — M79642 Pain in left hand: Secondary | ICD-10-CM

## 2020-12-26 DIAGNOSIS — M542 Cervicalgia: Secondary | ICD-10-CM

## 2020-12-26 DIAGNOSIS — F17200 Nicotine dependence, unspecified, uncomplicated: Secondary | ICD-10-CM

## 2020-12-26 DIAGNOSIS — M545 Low back pain, unspecified: Secondary | ICD-10-CM

## 2020-12-26 DIAGNOSIS — F1020 Alcohol dependence, uncomplicated: Secondary | ICD-10-CM

## 2020-12-26 MED ORDER — TRIAMCINOLONE ACETONIDE 40 MG/ML IJ SUSP
40.0000 mg | INTRAMUSCULAR | Status: AC | PRN
  Administered 2020-12-26: 40 mg via INTRA_ARTICULAR

## 2020-12-26 MED ORDER — ADALIMUMAB 40 MG/0.4ML ~~LOC~~ PSKT
40.0000 mg | PREFILLED_SYRINGE | Freq: Once | SUBCUTANEOUS | Status: AC
  Administered 2020-12-26: 40 mg via SUBCUTANEOUS

## 2020-12-26 MED ORDER — LIDOCAINE HCL 1 % IJ SOLN
1.5000 mL | INTRAMUSCULAR | Status: AC | PRN
  Administered 2020-12-26: 1.5 mL

## 2020-12-26 MED ORDER — HUMIRA (2 PEN) 40 MG/0.4ML ~~LOC~~ AJKT: 40.0000 mg | AUTO-INJECTOR | SUBCUTANEOUS | 0 refills | Status: DC

## 2020-12-26 NOTE — Patient Instructions (Signed)
Standing Labs We placed an order today for your standing lab work.   Please have your standing labs drawn in 1 month after starting Humira and then every 3 months  If possible, please have your labs drawn 2 weeks prior to your appointment so that the provider can discuss your results at your appointment.  Please note that you may see your imaging and lab results in [facility] before we have reviewed them. We may be awaiting multiple results to interpret others before contacting you. Please allow our office up to 72 hours to thoroughly review all of the results before contacting the office for clarification of your results.  We have open lab daily: Monday through Thursday from 1:30-4:30 PM and Friday from 1:30-4:00 PM at the office of Dr. Bo Merino, [facility].   Please be advised, all patients with office appointments requiring lab work will take precedent over walk-in lab work.  If possible, please come for your lab work on Monday and Friday afternoons, as you may experience shorter wait times. The office is located at [address], [place], [place], [place] [address] No appointment is necessary.   Labs are drawn by [facility]. Please bring your co-pay at the time of your lab draw.  You may receive a bill from [facility] for your lab work.  If you wish to have your labs drawn at another location, please call the office 24 hours in advance to send orders.  If you have any questions regarding directions or hours of operation,  please call [phone].   As a reminder, please drink plenty of water prior to coming for your lab work. Thanks!   Vaccines You are taking a medication(s) that can suppress your immune system.  The following immunizations are recommended: Flu annually Covid-19  Td/Tdap (tetanus, diphtheria, pertussis) every 10 years Pneumonia (Prevnar 15 then Pneumovax 23 at least 1 year apart.  Alternatively, can take Prevnar 20 without needing additional  dose) Shingrix: 2 doses from 4 weeks to 6 months apart  Please check with your PCP to make sure you are up to date.  If you have signs or symptoms of an infection or start antibiotics: First, call your PCP for workup of your infection. Hold your medication through the infection, until you complete your antibiotics, and until symptoms resolve if you take the following: Injectable medication (Actemra, Benlysta, Cimzia, Cosentyx, Enbrel, Humira, Kevzara, Orencia, Remicade, Simponi, Stelara, Taltz, Tremfya) Methotrexate Leflunomide (Arava) Mycophenolate (Cellcept) Morrie Sheldon, Olumiant, or Rinvoq  Please get annual skin examination to screen for skin cancer while you are taking Humira.

## 2020-12-26 NOTE — Progress Notes (Signed)
Patient new start to Humira while in office today. Patient was shown the proper technique to correctly administer the Humira pen. Patient was able to demonstrate proper use. Patient administered medication in office and was monitored for 30 minutes after for adverse reactions. Patient tolerated injection well. No adverse reactions noted. Patient advised to follow up in 1 month for labs.   Administrations This Visit     Adalimumab PSKT 40 mg     Admin Date 12/26/2020 Action Given Dose 40 mg Route Subcutaneous Administered By Carole Binning, LPN

## 2020-12-29 NOTE — Telephone Encounter (Signed)
Patient started Humira at [facility] on 12/26/20. [facility] to provide copay card information and primary insurance information.  Rep will send coverage information to insurance dept as they work through the processing queue  Knox Saliva, PharmD, MPH, BCPS Clinical Pharmacist (Rheumatology and Pulmonology)

## 2020-12-31 NOTE — Telephone Encounter (Signed)
Called [facility] for update on patient's Humira rx since starting on 12/26/20  Phone for [facility] team at pharmacy: [phone]  Per rep, patient's Humira copay card is still in in the insurance department being verified. Should be completed by today and patient will be reached out to later today. Will f/u Friday, 01/02/21  Knox Saliva, PharmD, MPH, BCPS Clinical Pharmacist (Rheumatology and Pulmonology)

## 2021-01-02 ENCOUNTER — Ambulatory Visit: Payer: 59 | Admitting: Rheumatology

## 2021-01-06 NOTE — Telephone Encounter (Signed)
Called [employer] team at [facility] to verify if patient was able to have Humira shipment scheduled. Per rep, they reached out to patient since they are reaching same insurance premium payment missing rejection when adjudicating claim. They spoke with patient on Friday, 01/02/21 and advised. They ATC patient on 01/04/21 but have not yet heard back from him.   ATC patient this morning and left VM to advise that he must pay insurance premium and once paid, can call [facility] back to schedule Humira shipment  Phone for [facility] team at pharmacy: [phone]  Knox Saliva, PharmD, MPH, BCPS Clinical Pharmacist (Rheumatology and Pulmonology)

## 2021-01-13 ENCOUNTER — Other Ambulatory Visit (HOSPITAL_COMMUNITY): Payer: Self-pay

## 2021-01-13 NOTE — Telephone Encounter (Signed)
[facility] ([employer] team) to f/u on patient's Humira shipment. They state they have been trying to reach patient to schedule shipment but unable to reach. They reached out this morning to schedule shipment but they left VM  Knox Saliva, PharmD, MPH, BCPS Clinical Pharmacist (Rheumatology and Pulmonology)

## 2021-01-30 NOTE — Progress Notes (Signed)
Office Visit Note  Patient: Leon Mason             Date of Birth: 1991/02/26           MRN: [id number]             PCP: [name], DO Referring: [name], DO Visit Date: 02/06/2021 Occupation: '@GUAROCC' @  Subjective:   Medication management  History of Present Illness: [name] is a 30 y.o. male with a history of spondyloarthropathy.  He started Humira on December 26, 2020.  He has not had any flares of iritis since then.  He has noticed improvement in the SI joint pain in the left knee joint pain.  He still continues to have pain and stiffness in his neck.  He states the neck pain radiates sometimes to his right arm.  He continues to have some stiffness in upper back.  Activities of Daily Living:  Patient reports morning stiffness for several hours.   Patient Reports nocturnal pain.  Difficulty dressing/grooming: Denies Difficulty climbing stairs: Reports Difficulty getting out of chair: Reports Difficulty using hands for taps, buttons, cutlery, and/or writing: Denies  Review of Systems  Constitutional:  Positive for fatigue.  HENT:  Negative for mouth sores, mouth dryness and nose dryness.   Eyes:  Positive for pain and redness. Negative for itching and dryness.  Respiratory:  Positive for shortness of breath and difficulty breathing.        Asthma flare  Cardiovascular:  Negative for chest pain and palpitations.  Gastrointestinal:  Negative for blood in stool, constipation and diarrhea.  Endocrine: Negative for increased urination.  Genitourinary:  Negative for difficulty urinating.  Musculoskeletal:  Positive for joint pain, joint pain, joint swelling, myalgias, morning stiffness, muscle tenderness and myalgias.  Skin:  Negative for color change, rash, redness and sensitivity to sunlight.  Allergic/Immunologic: Negative for susceptible to infections.  Neurological:  Positive for headaches and weakness. Negative for dizziness, numbness and  memory loss.  Hematological:  Negative for bruising/bleeding tendency and swollen glands.  Psychiatric/Behavioral:  Positive for depressed mood. Negative for confusion and sleep disturbance. The patient is nervous/anxious.    PMFS History:  Patient Active Problem List   Diagnosis Date Noted   Generalized joint pain 10/21/2020   Generalized abdominal pain 10/21/2020   Mild intermittent asthma with (acute) exacerbation 02/18/2019   Hypokalemia 02/18/2019   Normocytic anemia 02/18/2019   Elevated transaminase level 02/18/2019   Prolonged QT interval 02/18/2019   Viral syndrome 02/18/2019   SIRS (systemic inflammatory response syndrome) ([place]) 02/18/2019   Globus sensation 07/04/2018   Schizophrenia ([place]) 03/28/2018   MDD (major depressive disorder), recurrent episode, severe ([place]) 03/28/2018   Schizo-affective schizophrenia, chronic condition with acute exacerbation ([place]) 03/28/2018   Tobacco use disorder 12/17/2016   Schizoaffective disorder, bipolar type ([place]) 08/23/2016   Alcohol use disorder, severe, dependence ([place]) 08/23/2016   Allergic rhinitis due to pollen 10/09/2015   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   Mild persistent allergic asthma 10/09/2015   Psychosis ([place]) 06/14/2013    Past Medical History:  Diagnosis Date   Anxiety    Asthma    Bipolar 1 disorder ([place])    Hallucinations    Schizoaffective disorder ([place])    Schizophrenia ([place])     Family History  Problem Relation Age of Onset   Bipolar disorder Mother    Alcoholism Mother    Diabetes Mother    Cervical cancer Mother    OCD Father  Bipolar disorder Father    Eczema Brother    Post-traumatic stress disorder Brother    COPD Maternal Aunt    Heart attack Maternal Grandmother    Breast cancer Maternal Grandmother    Heart attack Maternal Grandfather    Cancer Paternal Grandmother    Heart attack Paternal Grandfather    Asthma Neg Hx    Allergic rhinitis Neg Hx    Angioedema Neg Hx    Urticaria Neg Hx     Immunodeficiency Neg Hx    Atopy Neg Hx    Prostate cancer Neg Hx    Colon cancer Neg Hx    Past Surgical History:  Procedure Laterality Date   WISDOM TOOTH EXTRACTION  2009   all four   Social History   Social History Narrative   Not on file   Immunization History  Administered Date(s) Administered   DTaP 01/03/1990, 03/17/1990, 06/08/1990, 02/20/1991, 06/01/1994   Hepatitis B 01/05/2001, 02/01/2001, 05/04/2005   HiB (PRP-OMP) 01/03/1990, 03/17/1990, 06/08/1990, 02/20/1991   IPV 01/03/1990, 03/17/1990, 02/20/1991, 06/01/1994   Influenza,inj,Quad PF,6+ Mos 02/20/2019   Influenza,inj,quad, With Preservative 03/18/2018   MMR 02/20/1991, 06/01/1994   Meningococcal Conjugate 05/04/2005   Pneumococcal Polysaccharide-23 08/29/2016, 02/20/2019   Tdap 05/04/2005     Objective: Vital Signs: BP 128/81 (BP Location: Left Arm, Patient Position: Sitting, Cuff Size: Large)   Pulse 83   Ht '6\' 2"'  (1.88 m)   Wt 276 lb 12.8 oz (125.6 kg)   BMI 35.54 kg/m    Physical Exam Vitals and nursing note reviewed.  Constitutional:      Appearance: He is well-developed.  HENT:     Head: Normocephalic and atraumatic.  Eyes:     Conjunctiva/sclera: Conjunctivae normal.     Pupils: Pupils are equal, round, and reactive to light.  Cardiovascular:     Rate and Rhythm: Normal rate and regular rhythm.     Heart sounds: Normal heart sounds.  Pulmonary:     Effort: Pulmonary effort is normal.     Breath sounds: Normal breath sounds.  Abdominal:     General: Bowel sounds are normal.     Palpations: Abdomen is soft.  Musculoskeletal:     Cervical back: Normal range of motion and neck supple.  Skin:    General: Skin is warm and dry.     Capillary Refill: Capillary refill takes less than 2 seconds.  Neurological:     Mental Status: He is alert and oriented to person, place, and time.  Psychiatric:        Behavior: Behavior normal.     Musculoskeletal Exam: He has stiffness with range of  motion of the cervical spine.  There was no point tenderness.  He has stiffness and discomfort range of motion of his thoracic spine.  He has thoracic kyphosis.  Shoulder joints, elbow joints, wrist joints, MCPs PIPs and DIPs with good range of motion.  He has contracture in his left fourth PIP from previous injury.  Hip joints and knee joints with good range of motion.  No warmth swelling or effusion was noted.  There was no tenderness over ankles or MTPs.  There was no evidence of Achilles tendinitis or plantar fasciitis.  CDAI Exam: CDAI Score: -- Patient Global: --; Provider Global: -- Swollen: --; Tender: -- Joint Exam 02/06/2021   No joint exam has been documented for this visit   There is currently no information documented on the homunculus. Go to the Rheumatology activity and complete the homunculus joint  exam.  Investigation: No additional findings.  Imaging: No results found.  Recent Labs: Lab Results  Component Value Date   WBC 7.9 12/04/2020   HGB 16.6 12/04/2020   PLT 238 12/04/2020   NA 141 12/04/2020   K 4.1 12/04/2020   CL 104 12/04/2020   CO2 18 (L) 12/04/2020   GLUCOSE 75 12/04/2020   BUN 11 12/04/2020   CREATININE 0.77 12/04/2020   BILITOT 0.5 12/04/2020   ALKPHOS 55 02/18/2019   AST 33 12/04/2020   ALT 48 (H) 12/04/2020   PROT 7.9 12/04/2020   PROT 7.9 12/04/2020   ALBUMIN 4.0 02/18/2019   CALCIUM 10.1 12/04/2020   GFRAA >60 02/19/2019   QFTBGOLDPLUS NEGATIVE 12/04/2020    Speciality Comments: Humira started December 26, 2020  Procedures:  No procedures performed Allergies: Other, Dust mite extract, and Pollen extract   Assessment / Plan:     Visit Diagnoses: Spondyloarthropathy - Inflammatory arthritis, iritis, chronic SI joint pain, chronic pain in neck, thoracic and lumbar region.HLA-B27 positive. Inadequate response to NSAIDs and GI side effects in the past.  He has been on Humira since December 26, 2020.  He has noticed gradual improvement in  his symptoms.  He has not had any joint swelling.  He continues to have some stiffness in his neck and thoracic spine.  High risk medication use - Started Humira on 12/26/20 - Plan: CBC with Differential/Platelet, COMPLETE METABOLIC PANEL WITH GFR today and then every 3 months to monitor for drug toxicity.  TB gold was negative on December 04, 2020.  He has been advised to hold Humira in case he develops an infection and restart once the infection resolves.  Information regarding realization was placed in the AVS.  Annual skin examination to screen for nonmelanoma skin cancer was advised.  Use of sunscreen was advised.  Prescription refill for Humira was given today.  Iritis - no recurrence since the start of Humira.  HLA B27 positive  Chronic pain of left knee-he has noticed improvement in his left knee joint pain.  No warmth swelling or effusion was noted.  Neck pain-he continues to have neck pain and stiffness.  X-rays were unremarkable in the past.  I gave him a handout on C-spine exercises.  He declined physical therapy.  Thoracic pain-he continues to have some discomfort and stiffness in thoracic spine.  A handout on back exercises was given.  Some of the exercises were demonstrated in the office.  He declined physical therapy.  He has postural kyphosis.  Correcting posture and stretching exercises were discussed.  Chronic SI joint pain-he has noticed gradual improvement.  Elevated transaminase level-patient quit drinking about a month ago.  His LFTs are improving.  Other fatigue-he continues to have fatigue.  Prolonged QT interval  Palpitations -he complains of increased palpitations.  I will refer him to cardiology.  Other medical problems are listed as follows:  Allergic urticaria  Mild persistent allergic asthma  Schizoaffective disorder, bipolar type (H[facility])  SIRS (systemic inflammatory response syndrome) ([facility])  Smoker - only 1 cig / day. Quit marijuana 1 month ago.   Smoking cessation was emphasized.  Alcohol use disorder, severe, dependence ([place]) - quit drinking 1 month ago.  Orders: Orders Placed This Encounter  Procedures   CBC with Differential/Platelet   COMPLETE METABOLIC PANEL WITH GFR   Ambulatory referral to Cardiology    Meds ordered this encounter  Medications   Adalimumab (HUMIRA PEN) 40 MG/0.4ML PNKT    Sig: Inject 40 mg into  the skin every 14 (fourteen) days.    Dispense:  3 each    Refill:  0    3 kits-6 pens     Follow-Up Instructions: Return in about 3 months (around 05/07/2021) for Spondyloarthropathy.   Bo Merino, MD  Note - This record has been created using Editor, commissioning.  Chart creation errors have been sought, but may not always  have been located. Such creation errors do not reflect on  the standard of medical care.

## 2021-02-06 ENCOUNTER — Ambulatory Visit (INDEPENDENT_AMBULATORY_CARE_PROVIDER_SITE_OTHER): Payer: 59 | Admitting: Rheumatology

## 2021-02-06 ENCOUNTER — Encounter: Payer: Self-pay | Admitting: Rheumatology

## 2021-02-06 ENCOUNTER — Other Ambulatory Visit: Payer: Self-pay

## 2021-02-06 VITALS — BP 128/81 | HR 83 | Ht 74.0 in | Wt 276.8 lb

## 2021-02-06 DIAGNOSIS — Z1589 Genetic susceptibility to other disease: Secondary | ICD-10-CM | POA: Diagnosis not present

## 2021-02-06 DIAGNOSIS — R002 Palpitations: Secondary | ICD-10-CM

## 2021-02-06 DIAGNOSIS — M542 Cervicalgia: Secondary | ICD-10-CM

## 2021-02-06 DIAGNOSIS — M546 Pain in thoracic spine: Secondary | ICD-10-CM

## 2021-02-06 DIAGNOSIS — F1020 Alcohol dependence, uncomplicated: Secondary | ICD-10-CM

## 2021-02-06 DIAGNOSIS — M47819 Spondylosis without myelopathy or radiculopathy, site unspecified: Secondary | ICD-10-CM

## 2021-02-06 DIAGNOSIS — R6510 Systemic inflammatory response syndrome (SIRS) of non-infectious origin without acute organ dysfunction: Secondary | ICD-10-CM

## 2021-02-06 DIAGNOSIS — Z79899 Other long term (current) drug therapy: Secondary | ICD-10-CM | POA: Diagnosis not present

## 2021-02-06 DIAGNOSIS — G8929 Other chronic pain: Secondary | ICD-10-CM

## 2021-02-06 DIAGNOSIS — R9431 Abnormal electrocardiogram [ECG] [EKG]: Secondary | ICD-10-CM

## 2021-02-06 DIAGNOSIS — F250 Schizoaffective disorder, bipolar type: Secondary | ICD-10-CM

## 2021-02-06 DIAGNOSIS — F17200 Nicotine dependence, unspecified, uncomplicated: Secondary | ICD-10-CM

## 2021-02-06 DIAGNOSIS — R7401 Elevation of levels of liver transaminase levels: Secondary | ICD-10-CM

## 2021-02-06 DIAGNOSIS — H209 Unspecified iridocyclitis: Secondary | ICD-10-CM

## 2021-02-06 DIAGNOSIS — L500 Allergic urticaria: Secondary | ICD-10-CM

## 2021-02-06 DIAGNOSIS — M25562 Pain in left knee: Secondary | ICD-10-CM

## 2021-02-06 DIAGNOSIS — M533 Sacrococcygeal disorders, not elsewhere classified: Secondary | ICD-10-CM

## 2021-02-06 DIAGNOSIS — R5383 Other fatigue: Secondary | ICD-10-CM

## 2021-02-06 DIAGNOSIS — J4530 Mild persistent asthma, uncomplicated: Secondary | ICD-10-CM

## 2021-02-06 MED ORDER — HUMIRA (2 PEN) 40 MG/0.4ML ~~LOC~~ AJKT: 40.0000 mg | AUTO-INJECTOR | SUBCUTANEOUS | 0 refills | Status: DC

## 2021-02-06 NOTE — Patient Instructions (Addendum)
Standing Labs We placed an order today for your standing lab work.   Please have your standing labs drawn in March and every 3 months  If possible, please have your labs drawn 2 weeks prior to your appointment so that the provider can discuss your results at your appointment.  Please note that you may see your imaging and lab results in [place] before we have reviewed them. We may be awaiting multiple results to interpret others before contacting you. Please allow our office up to 72 hours to thoroughly review all of the results before contacting the office for clarification of your results.  We have open lab daily: Monday through Thursday from 1:30-4:30 PM and Friday from 1:30-4:00 PM at the office of Dr. Bo Merino, [facility].   Please be advised, all patients with office appointments requiring lab work will take precedent over walk-in lab work.  If possible, please come for your lab work on Monday and Friday afternoons, as you may experience shorter wait times. The office is located at [address], [place], [place], [place] [address] No appointment is necessary.   Labs are drawn by [facility]. Please bring your co-pay at the time of your lab draw.  You may receive a bill from [facility] for your lab work.  If you wish to have your labs drawn at another location, please call the office 24 hours in advance to send orders.  If you have any questions regarding directions or hours of operation,  please call [phone].   As a reminder, please drink plenty of water prior to coming for your lab work. Thanks!   Vaccines You are taking a medication(s) that can suppress your immune system.  The following immunizations are recommended: Flu annually Covid-19  Td/Tdap (tetanus, diphtheria, pertussis) every 10 years Pneumonia (Prevnar 15 then Pneumovax 23 at least 1 year apart.  Alternatively, can take Prevnar 20 without needing additional dose) Shingrix: 2 doses from 4 weeks  to 6 months apart  Please check with your PCP to make sure you are up to date.   If you have signs or symptoms of an infection or start antibiotics: First, call your PCP for workup of your infection. Hold your medication through the infection, until you complete your antibiotics, and until symptoms resolve if you take the following: Injectable medication (Actemra, Benlysta, Cimzia, Cosentyx, Enbrel, Humira, Kevzara, Orencia, Remicade, Simponi, Stelara, Taltz, Tremfya) Methotrexate Leflunomide (Arava) Mycophenolate (Cellcept) Roma Kayser, or Rinvoq   Annual skin examination to screen for nonmelanoma skin cancer is advised while you are taking Humira. Cervical Strain and Sprain Rehab Ask your health care provider which exercises are safe for you. Do exercises exactly as told by your health care provider and adjust them as directed. It is normal to feel mild stretching, pulling, tightness, or discomfort as you do these exercises. Stop right away if you feel sudden pain or your pain gets worse. Do not begin these exercises until told by your health care provider. Stretching and range-of-motion exercises Cervical side bending  Using good posture, sit on a stable chair or stand up. Without moving your shoulders, slowly tilt your left / right ear to your shoulder until you feel a stretch in the opposite side neck muscles. You should be looking straight ahead. Hold for __________ seconds. Repeat with the other side of your neck. Repeat __________ times. Complete this exercise __________ times a day. Cervical rotation  Using good posture, sit on a stable chair or stand up. Slowly turn  your head to the side as if you are looking over your left / right shoulder. Keep your eyes level with the ground. Stop when you feel a stretch along the side and the back of your neck. Hold for __________ seconds. Repeat this by turning to your other side. Repeat __________ times. Complete this exercise  __________ times a day. Thoracic extension and pectoral stretch Roll a towel or a small blanket so it is about 4 inches (10 cm) in diameter. Lie down on your back on a firm surface. Put the towel lengthwise, under your spine in the middle of your back. It should not be under your shoulder blades. The towel should line up with your spine from your middle back to your lower back. Put your hands behind your head and let your elbows fall out to your sides. Hold for __________ seconds. Repeat __________ times. Complete this exercise __________ times a day. Strengthening exercises Isometric upper cervical flexion Lie on your back with a thin pillow behind your head and a small rolled-up towel under your neck. Gently tuck your chin toward your chest and nod your head down to look toward your feet. Do not lift your head off the pillow. Hold for __________ seconds. Release the tension slowly. Relax your neck muscles completely before you repeat this exercise. Repeat __________ times. Complete this exercise __________ times a day. Isometric cervical extension  Stand about 6 inches (15 cm) away from a wall, with your back facing the wall. Place a soft object, about 6-8 inches (15-20 cm) in diameter, between the back of your head and the wall. A soft object could be a small pillow, a ball, or a folded towel. Gently tilt your head back and press into the soft object. Keep your jaw and forehead relaxed. Hold for __________ seconds. Release the tension slowly. Relax your neck muscles completely before you repeat this exercise. Repeat __________ times. Complete this exercise __________ times a day. Posture and body mechanics Body mechanics refers to the movements and positions of your body while you do your daily activities. Posture is part of body mechanics. Good posture and healthy body mechanics can help to relieve stress in your body's tissues and joints. Good posture means that your spine is in its  natural S-curve position (your spine is neutral), your shoulders are pulled back slightly, and your head is not tipped forward. The following are general guidelines for applying improved posture and body mechanics to your everyday activities. Sitting  When sitting, keep your spine neutral and keep your feet flat on the floor. Use a footrest, if necessary, and keep your thighs parallel to the floor. Avoid rounding your shoulders, and avoid tilting your head forward. When working at a desk or a computer, keep your desk at a height where your hands are slightly lower than your elbows. Slide your chair under your desk so you are close enough to maintain good posture. When working at a computer, place your monitor at a height where you are looking straight ahead and you do not have to tilt your head forward or downward to look at the screen. Standing  When standing, keep your spine neutral and keep your feet about hip-width apart. Keep a slight bend in your knees. Your ears, shoulders, and hips should line up. When you do a task in which you stand in one place for a long time, place one foot up on a stable object that is 2-4 inches (5-10 cm) high, such as a  footstool. This helps keep your spine neutral. Resting When lying down and resting, avoid positions that are most painful for you. Try to support your neck in a neutral position. You can use a contour pillow or a small rolled-up towel. Your pillow should support your neck but not push on it. This information is not intended to replace advice given to you by your health care provider. Make sure you discuss any questions you have with your health care provider. Document Revised: 06/14/2018 Document Reviewed: 11/23/2017 Elsevier Patient Education  [place]. Back Exercises The following exercises strengthen the muscles that help to support the trunk (torso) and back. They also help to keep the lower back flexible. Doing these exercises can help  to prevent or lessen existing low back pain. If you have back pain or discomfort, try doing these exercises 2-3 times each day or as told by your health care provider. As your pain improves, do them once each day, but increase the number of times that you repeat the steps for each exercise (do more repetitions). To prevent the recurrence of back pain, continue to do these exercises once each day or as told by your health care provider. Do exercises exactly as told by your health care provider and adjust them as directed. It is normal to feel mild stretching, pulling, tightness, or discomfort as you do these exercises, but you should stop right away if you feel sudden pain or your pain gets worse. Exercises Single knee to chest Repeat these steps 3-5 times for each leg: Lie on your back on a firm bed or the floor with your legs extended. Bring one knee to your chest. Your other leg should stay extended and in contact with the floor. Hold your knee in place by grabbing your knee or thigh with both hands and hold. Pull on your knee until you feel a gentle stretch in your lower back or buttocks. Hold the stretch for 10-30 seconds. Slowly release and straighten your leg.  Pelvic tilt Repeat these steps 5-10 times: Lie on your back on a firm bed or the floor with your legs extended. Bend your knees so they are pointing toward the ceiling and your feet are flat on the floor. Tighten your lower abdominal muscles to press your lower back against the floor. This motion will tilt your pelvis so your tailbone points up toward the ceiling instead of pointing to your feet or the floor. With gentle tension and even breathing, hold this position for 5-10 seconds.  Cat-cow Repeat these steps until your lower back becomes more flexible: Get into a hands-and-knees position on a firm bed or the floor. Keep your hands under your shoulders, and keep your knees under your hips. You may place padding under your  knees for comfort. Let your head hang down toward your chest. Contract your abdominal muscles and point your tailbone toward the floor so your lower back becomes rounded like the back of a cat. Hold this position for 5 seconds. Slowly lift your head, let your abdominal muscles relax, and point your tailbone up toward the ceiling so your back forms a sagging arch like the back of a cow. Hold this position for 5 seconds.  Press-ups Repeat these steps 5-10 times: Lie on your abdomen (face-down) on a firm bed or the floor. Place your palms near your head, about shoulder-width apart. Keeping your back as relaxed as possible and keeping your hips on the floor, slowly straighten your arms to raise  the top half of your body and lift your shoulders. Do not use your back muscles to raise your upper torso. You may adjust the placement of your hands to make yourself more comfortable. Hold this position for 5 seconds while you keep your back relaxed. Slowly return to lying flat on the floor.  Bridges Repeat these steps 10 times: Lie on your back on a firm bed or the floor. Bend your knees so they are pointing toward the ceiling and your feet are flat on the floor. Your arms should be flat at your sides, next to your body. Tighten your buttocks muscles and lift your buttocks off the floor until your waist is at almost the same height as your knees. You should feel the muscles working in your buttocks and the back of your thighs. If you do not feel these muscles, slide your feet 1-2 inches (2.5-5 cm) farther away from your buttocks. Hold this position for 3-5 seconds. Slowly lower your hips to the starting position, and allow your buttocks muscles to relax completely. If this exercise is too easy, try doing it with your arms crossed over your chest. Abdominal crunches Repeat these steps 5-10 times: Lie on your back on a firm bed or the floor with your legs extended. Bend your knees so they are pointing  toward the ceiling and your feet are flat on the floor. Cross your arms over your chest. Tip your chin slightly toward your chest without bending your neck. Tighten your abdominal muscles and slowly raise your torso high enough to lift your shoulder blades a tiny bit off the floor. Avoid raising your torso higher than that because it can put too much stress on your lower back and does not help to strengthen your abdominal muscles. Slowly return to your starting position.  Back lifts Repeat these steps 5-10 times: Lie on your abdomen (face-down) with your arms at your sides, and rest your forehead on the floor. Tighten the muscles in your legs and your buttocks. Slowly lift your chest off the floor while you keep your hips pressed to the floor. Keep the back of your head in line with the curve in your back. Your eyes should be looking at the floor. Hold this position for 3-5 seconds. Slowly return to your starting position.  Contact a health care provider if: Your back pain or discomfort gets much worse when you do an exercise. Your worsening back pain or discomfort does not lessen within 2 hours after you exercise. If you have any of these problems, stop doing these exercises right away. Do not do them again unless your health care provider says that you can. Get help right away if: You develop sudden, severe back pain. If this happens, stop doing the exercises right away. Do not do them again unless your health care provider says that you can. This information is not intended to replace advice given to you by your health care provider. Make sure you discuss any questions you have with your health care provider. Document Revised: 08/19/2020 Document Reviewed: 05/07/2020 Elsevier Patient Education  [place].

## 2021-02-07 LAB — COMPLETE METABOLIC PANEL WITH GFR
AG Ratio: 1.8 (calc) (ref 1.0–2.5)
ALT: 37 U/L (ref 9–46)
AST: 18 U/L (ref 10–40)
Albumin: 4.8 g/dL (ref 3.6–5.1)
Alkaline phosphatase (APISO): 58 U/L (ref 36–130)
BUN: 9 mg/dL (ref 7–25)
CO2: 27 mmol/L (ref 20–32)
Calcium: 9.7 mg/dL (ref 8.6–10.3)
Chloride: 102 mmol/L (ref 98–110)
Creat: 0.83 mg/dL (ref 0.60–1.26)
Globulin: 2.7 g/dL (calc) (ref 1.9–3.7)
Glucose, Bld: 84 mg/dL (ref 65–99)
Potassium: 3.9 mmol/L (ref 3.5–5.3)
Sodium: 140 mmol/L (ref 135–146)
Total Bilirubin: 0.7 mg/dL (ref 0.2–1.2)
Total Protein: 7.5 g/dL (ref 6.1–8.1)
eGFR: 120 mL/min/{1.73_m2} (ref >=60)

## 2021-02-07 LAB — CBC WITH DIFFERENTIAL/PLATELET
Absolute Monocytes: 479 cells/uL (ref 200–950)
Basophils Absolute: 91 cells/uL (ref 0–200)
Basophils Relative: 1.2 %
Eosinophils Absolute: 319 cells/uL (ref 15–500)
Eosinophils Relative: 4.2 %
HCT: 49.5 % (ref 38.5–50.0)
Hemoglobin: 16.3 g/dL (ref 13.2–17.1)
Lymphs Abs: 2690 cells/uL (ref 850–3900)
MCH: 30.3 pg (ref 27.0–33.0)
MCHC: 32.9 g/dL (ref 32.0–36.0)
MCV: 92 fL (ref 80.0–100.0)
MPV: 11.8 fL (ref 7.5–12.5)
Monocytes Relative: 6.3 %
Neutro Abs: 4020 cells/uL (ref 1500–7800)
Neutrophils Relative %: 52.9 %
Platelets: 214 10*3/uL (ref 140–400)
RBC: 5.38 10*6/uL (ref 4.20–5.80)
RDW: 14.1 % (ref 11.0–15.0)
Total Lymphocyte: 35.4 %
WBC: 7.6 10*3/uL (ref 3.8–10.8)

## 2021-02-07 NOTE — Progress Notes (Signed)
CBC and CMP are normal.

## 2021-02-13 ENCOUNTER — Other Ambulatory Visit: Payer: Self-pay | Admitting: Family Medicine

## 2021-02-16 ENCOUNTER — Encounter: Payer: Self-pay | Admitting: Nurse Practitioner

## 2021-02-16 ENCOUNTER — Other Ambulatory Visit: Payer: Self-pay

## 2021-02-16 ENCOUNTER — Ambulatory Visit (INDEPENDENT_AMBULATORY_CARE_PROVIDER_SITE_OTHER): Payer: 59 | Admitting: Nurse Practitioner

## 2021-02-16 VITALS — BP 134/86 | HR 69 | Temp 97.8°F | Resp 20 | Ht 74.0 in | Wt 275.0 lb

## 2021-02-16 DIAGNOSIS — M5489 Other dorsalgia: Secondary | ICD-10-CM | POA: Diagnosis not present

## 2021-02-16 DIAGNOSIS — M542 Cervicalgia: Secondary | ICD-10-CM

## 2021-02-16 DIAGNOSIS — Z23 Encounter for immunization: Secondary | ICD-10-CM

## 2021-02-16 MED ORDER — DICLOFENAC SODIUM 1 % EX GEL: 2.0000 g | Freq: Four times a day (QID) | CUTANEOUS | 1 refills | Status: DC

## 2021-02-16 NOTE — Assessment & Plan Note (Signed)
Voltaren topical gel.  Tylenol as needed, back strengthening exercises.  Follow-up with unresolved symptoms.

## 2021-02-16 NOTE — Assessment & Plan Note (Signed)
Take pain medication as prescribed.  Tylenol/ibuprofen.  Follow-up with worsening unresolved symptoms.

## 2021-02-16 NOTE — Patient Instructions (Signed)
Chronic Back Pain When back pain lasts longer than 3 months, it is called chronic back pain. Pain may get worse at certain times (flare-ups). There are things you can do at home to manage your pain. Follow these instructions at home: Pay attention to any changes in your symptoms. Take these actions to help with your pain: Managing pain and stiffness   If told, put ice on the painful area. Your doctor may tell you to use ice for 24-48 hours after the flare-up starts. To do this: Put ice in a plastic bag. Place a towel between your skin and the bag. Leave the ice on for 20 minutes, 2-3 times a day. If told, put heat on the painful area. Do this as often as told by your doctor. Use the heat source that your doctor recommends, such as a moist heat pack or a heating pad. Place a towel between your skin and the heat source. Leave the heat on for 20-30 minutes. Take off the heat if your skin turns bright red. This is especially important if you are unable to feel pain, heat, or cold. You may have a greater risk of getting burned. Soak in a warm bath. This can help relieve pain. Activity  Avoid bending and other activities that make pain worse. When standing: Keep your upper back and neck straight. Keep your shoulders pulled back. Avoid slouching. When sitting: Keep your back straight. Relax your shoulders. Do not round your shoulders or pull them backward. Do not sit or stand in one place for long periods of time. Take short rest breaks during the day. Lying down or standing is usually better than sitting. Resting can help relieve pain. When sitting or lying down for a long time, do some mild activity or stretching. This will help to prevent stiffness and pain. Get regular exercise. Ask your doctor what activities are safe for you. Do not lift anything that is heavier than 10 lb (4.5 kg) or the limit that you are told, until your doctor says that it is safe. To prevent injury when you lift  things: Bend your knees. Keep the weight close to your body. Avoid twisting. Sleep on a firm mattress. Try lying on your side with your knees slightly bent. If you lie on your back, put a pillow under your knees. Medicines Treatment may include medicines for pain and swelling taken by mouth or put on the skin, prescription pain medicine, or muscle relaxants. Take over-the-counter and prescription medicines only as told by your doctor. Ask your doctor if the medicine prescribed to you: Requires you to avoid driving or using machinery. Can cause trouble pooping (constipation). You may need to take these actions to prevent or treat trouble pooping: Drink enough fluid to keep your pee (urine) pale yellow. Take over-the-counter or prescription medicines. Eat foods that are high in fiber. These include beans, whole grains, and fresh fruits and vegetables. Limit foods that are high in fat and sugars. These include fried or sweet foods. General instructions Do not use any products that contain nicotine or tobacco, such as cigarettes, e-cigarettes, and chewing tobacco. If you need help quitting, ask your doctor. Keep all follow-up visits as told by your doctor. This is important. Contact a doctor if: Your pain does not get better with rest or medicine. Your pain gets worse, or you have new pain. You have a high fever. You lose weight very quickly. You have trouble doing your normal activities. Get help right away if: One   or both of your legs or feet feel weak. One or both of your legs or feet lose feeling (have numbness). You have trouble controlling when you poop (have a bowel movement) or pee (urinate). You have bad back pain and: You feel like you may vomit (nauseous), or you vomit. You have pain in your belly (abdomen). You have shortness of breath. You faint. Summary When back pain lasts longer than 3 months, it is called chronic back pain. Pain may get worse at certain times  (flare-ups). Use ice and heat as told by your doctor. Your doctor may tell you to use ice after flare-ups. This information is not intended to replace advice given to you by your health care provider. Make sure you discuss any questions you have with your health care provider. Document Revised: 04/04/2019 Document Reviewed: 04/04/2019 Elsevier Patient Education  2022 [employer]. Cervical Radiculopathy Cervical radiculopathy means that a nerve in the neck (a cervical nerve) is pinched or bruised. This can happen because of an injury to the cervical spine (vertebrae) in the neck, or as a normal part of getting older. This condition can cause pain or loss of feeling (numbness) that runs from your neck all the way down to your arm and fingers. Often, this condition gets better with rest. Treatment may be needed if the condition does not get better. What are the causes? A neck injury. A bulging disk in your spine. Sudden muscle tightening (muscle spasms). Tight muscles in your neck due to overuse. Arthritis. Breakdown in the bones and joints of the spine (spondylosis) due to getting older. Bone spurs that form near the nerves in the neck. What are the signs or symptoms? Pain. The pain may: Run from the neck to the arm and hand. Be very bad or irritating. Get worse when you move your neck. Loss of feeling or tingling in your arm or hand. Weakness in your arm or hand, in very bad cases. How is this treated? In many cases, treatment is not needed for this condition. With rest, the condition often gets better over time. If treatment is needed, options may include: Wearing a soft neck collar (cervical collar) for short periods of time. Doing exercises (physical therapy) to strengthen your neck muscles. Taking medicines. Having shots (injections) in your spine, in very bad cases. Having surgery. This may be needed if other treatments do not help. The type of surgery that is used will depend on  the cause of your condition. Follow these instructions at home: If you have a soft neck collar: Wear it as told by your doctor. Take it off only as told by your doctor. Ask your doctor if you can take the collar off for cleaning and bathing. If you are allowed to take the collar off for cleaning or bathing: Follow instructions from your doctor about how to take off the collar safely. Clean the collar by wiping it with mild soap and water and drying it completely. Take out any removable pads in the collar every 1-2 days. Wash them by hand with soap and water. Let them air-dry completely before you put them back in the collar. Check your skin under the collar for redness or sores. If you see any, tell your doctor. Managing pain   Take over-the-counter and prescription medicines only as told by your doctor. If told, put ice on the painful area. To do this: If you have a soft neck collar, take if off as told by your doctor. Put ice  in a plastic bag. Place a towel between your skin and the bag. Leave the ice on for 20 minutes, 2-3 times a day. Take off the ice if your skin turns bright red. This is very important. If you cannot feel pain, heat, or cold, you have a greater risk of damage to the area. If using ice does not help, you can try using heat. Use the heat source that your doctor recommends, such as a moist heat pack or a heating pad. Place a towel between your skin and the heat source. Leave the heat on for 20-30 minutes. Take off the heat if your skin turns bright red. This is very important. If you cannot feel pain, heat, or cold, you have a greater risk of getting burned. You may try a gentle neck and shoulder rub (massage). Activity Rest as needed. Return to your normal activities when your doctor says that it is safe. Do exercises as told by your doctor or physical therapist. You may have to avoid lifting. Ask your doctor how much you can safely lift. General instructions Use a  flat pillow when you sleep. Do not drive while wearing a soft neck collar. If you do not have a soft neck collar, ask your doctor if it is safe to drive while your neck heals. Ask your doctor if you should avoid driving or using machines while you are taking your medicine. Do not smoke or use any products that contain nicotine or tobacco. If you need help quitting, ask your doctor. Keep all follow-up visits. Contact a doctor if: Your condition does not get better with treatment. Get help right away if: Your pain gets worse and medicine does not help. You lose feeling or feel weak in your hand, arm, face, or leg. You have a high fever. Your neck is stiff. You cannot control when you poop or pee (have incontinence). You have trouble with walking, balance, or talking. Summary Cervical radiculopathy means that a nerve in the neck is pinched or bruised. A nerve can get pinched from a bulging disk, arthritis, an injury to the neck, or other causes. Symptoms include pain, tingling, or loss of feeling that goes from the neck to the arm or hand. Weakness in your arm or hand can happen in very bad cases. Treatment may include resting, wearing a soft neck collar, and doing exercises. You might need to take medicines for pain. In very bad cases, shots or surgery may be needed. This information is not intended to replace advice given to you by your health care provider. Make sure you discuss any questions you have with your health care provider. Document Revised: 08/28/2020 Document Reviewed: 08/28/2020 Elsevier Patient Education  [place].

## 2021-02-16 NOTE — Progress Notes (Signed)
Acute Office Visit  Subjective:    Patient ID: Leon Mason, male    DOB: October 09, 1990, 30 y.o.   MRN: [id number]  Chief Complaint  Patient presents with   Asthma   Back Pain    Back Pain This is a chronic problem. The current episode started more than 1 year ago. The problem occurs intermittently. The problem is unchanged. The pain is present in the lumbar spine. The pain does not radiate. The pain is at a severity of 6/10. The pain is moderate. The symptoms are aggravated by bending and position. Pertinent negatives include no numbness, paresis or paresthesias. He has tried nothing for the symptoms.     Past Medical History:  Diagnosis Date   Anxiety    Asthma    Bipolar 1 disorder ([place])    Hallucinations    Schizoaffective disorder ([facility])    Schizophrenia ([facility])     Past Surgical History:  Procedure Laterality Date   WISDOM TOOTH EXTRACTION  2009   all four    Family History  Problem Relation Age of Onset   Bipolar disorder Mother    Alcoholism Mother    Diabetes Mother    Cervical cancer Mother    OCD Father    Bipolar disorder Father    Eczema Brother    Post-traumatic stress disorder Brother    COPD Maternal Aunt    Heart attack Maternal Grandmother    Breast cancer Maternal Grandmother    Heart attack Maternal Grandfather    Cancer Paternal Grandmother    Heart attack Paternal Grandfather    Asthma Neg Hx    Allergic rhinitis Neg Hx    Angioedema Neg Hx    Urticaria Neg Hx    Immunodeficiency Neg Hx    Atopy Neg Hx    Prostate cancer Neg Hx    Colon cancer Neg Hx     Social History   Socioeconomic History   Marital status: Single    Spouse name: Not on file   Number of children: Not on file   Years of education: Not on file   Highest education level: Not on file  Occupational History   Not on file  Tobacco Use   Smoking status: Every Day    Years: 0.50    Types: Cigarettes    Last attempt to quit: 05/28/2018    Years since quitting: 2.7    Smokeless tobacco: Former   Tobacco comments:    1 cigarette daily   Vaping Use   Vaping Use: Former  Substance and Sexual Activity   Alcohol use: Not Currently   Drug use: Not Currently    Types: Marijuana   Sexual activity: Not Currently  Other Topics Concern   Not on file  Social History Narrative   Not on file   Social Determinants of Health   Financial Resource Strain: Not on file  Food Insecurity: Not on file  Transportation Needs: Not on file  Physical Activity: Not on file  Stress: Not on file  Social Connections: Not on file  Intimate Partner Violence: Not on file    Outpatient Medications Prior to Visit  Medication Sig Dispense Refill   acetaminophen (TYLENOL) 500 MG tablet Take 1 tablet (500 mg total) by mouth every 6 (six) hours as needed. 30 tablet 0   Adalimumab (HUMIRA PEN) 40 MG/0.4ML PNKT Inject 40 mg into the skin every 14 (fourteen) days. 3 each 0   albuterol (PROVENTIL) (2.5 MG/3ML) 0.083% nebulizer solution Inhale  3 mLs (2.5 mg total) into the lungs every 6 (six) hours as needed for wheezing or shortness of breath. 75 mL 1   albuterol (VENTOLIN HFA) 108 (90 Base) MCG/ACT inhaler Inhale 1-2 puffs into the lungs every 6 (six) hours as needed for wheezing or shortness of breath. 1 each 1   IBUPROFEN PO Take by mouth as needed.     Multiple Vitamin (MULTIVITAMIN PO) Take by mouth daily.     Omega-3 Fatty Acids (FISH OIL PO) Take by mouth daily.     perphenazine (TRILAFON) 2 MG tablet Take 1 tablet (2 mg total) by mouth 3 (three) times daily. (Patient taking differently: Take 8 mg by mouth daily.) 90 tablet 2   sertraline (ZOLOFT) 50 MG tablet Take 1 tablet (50 mg total) by mouth daily. 30 tablet 2   bismuth subsalicylate (PEPTO-BISMOL) 262 MG/15ML suspension Take 30 mLs by mouth every 6 (six) hours as needed. 360 mL 0   No facility-administered medications prior to visit.    Allergies  Allergen Reactions   Other Hives and Shortness Of Breath    Dogs  and Cats.   Dust Mite Extract    Pollen Extract     Review of Systems  Constitutional: Negative.   HENT: Negative.    Eyes: Negative.   Respiratory: Negative.    Gastrointestinal: Negative.   Genitourinary: Negative.   Musculoskeletal:  Positive for back pain.  Neurological:  Negative for numbness and paresthesias.  All other systems reviewed and are negative.     Objective:    Physical Exam Vitals and nursing note reviewed.  Constitutional:      Appearance: Normal appearance. He is obese.  HENT:     Right Ear: External ear normal.     Left Ear: External ear normal.     Mouth/Throat:     Mouth: Mucous membranes are moist.     Pharynx: Oropharynx is clear.  Eyes:     Conjunctiva/sclera: Conjunctivae normal.  Neck:   Cardiovascular:     Rate and Rhythm: Normal rate and regular rhythm.     Pulses: Normal pulses.     Heart sounds: Normal heart sounds.  Pulmonary:     Effort: Pulmonary effort is normal.     Breath sounds: Normal breath sounds.  Abdominal:     General: Bowel sounds are normal.  Musculoskeletal:     Cervical back: Pain with movement present. Decreased range of motion.     Lumbar back: Tenderness present. Decreased range of motion.  Skin:    General: Skin is warm.     Findings: No rash.  Neurological:     General: No focal deficit present.     Mental Status: He is alert and oriented to person, place, and time.  Psychiatric:        Behavior: Behavior normal.    BP 134/86   Pulse 69   Temp 97.8 F (36.6 C)   Resp 20   Ht '6\' 2"'  (1.88 m)   Wt 275 lb (124.7 kg)   SpO2 95%   BMI 35.31 kg/m  Wt Readings from Last 3 Encounters:  02/16/21 275 lb (124.7 kg)  02/06/21 276 lb 12.8 oz (125.6 kg)  12/26/20 291 lb (132 kg)    Health Maintenance Due  Topic Date Due   COVID-19 Vaccine (1) Never done   TETANUS/TDAP  05/05/2015   Pneumococcal Vaccine 94-27 Years old (3 - PCV) 02/20/2020    There are no preventive care reminders to display for  this  patient.   Lab Results  Component Value Date   TSH 1.671 03/30/2018   Lab Results  Component Value Date   WBC 7.6 02/06/2021   HGB 16.3 02/06/2021   HCT 49.5 02/06/2021   MCV 92.0 02/06/2021   PLT 214 02/06/2021   Lab Results  Component Value Date   NA 140 02/06/2021   K 3.9 02/06/2021   CO2 27 02/06/2021   GLUCOSE 84 02/06/2021   BUN 9 02/06/2021   CREATININE 0.83 02/06/2021   BILITOT 0.7 02/06/2021   ALKPHOS 55 02/18/2019   AST 18 02/06/2021   ALT 37 02/06/2021   PROT 7.5 02/06/2021   ALBUMIN 4.0 02/18/2019   CALCIUM 9.7 02/06/2021   ANIONGAP 11 02/19/2019   EGFR 120 02/06/2021   Lab Results  Component Value Date   CHOL 221 (H) 03/30/2018   Lab Results  Component Value Date   HDL 35 (L) 03/30/2018   Lab Results  Component Value Date   LDLCALC 147 (H) 03/30/2018   Lab Results  Component Value Date   TRIG 194 (H) 03/30/2018   Lab Results  Component Value Date   CHOLHDL 6.3 03/30/2018   Lab Results  Component Value Date   HGBA1C 5.1 03/30/2018       Assessment & Plan:   Problem List Items Addressed This Visit       Other   Back pain without sciatica - Primary    Voltaren topical gel.  Tylenol as needed, back strengthening exercises.  Follow-up with unresolved symptoms.      Relevant Medications   diclofenac Sodium (VOLTAREN) 1 % GEL   Neck pain    Take pain medication as prescribed.  Tylenol/ibuprofen.  Follow-up with worsening unresolved symptoms.      Other Visit Diagnoses     Need for immunization against influenza       Relevant Orders   Flu Vaccine QUAD 55moIM (Fluarix, Fluzone & Alfiuria Quad PF) (Completed)        Meds ordered this encounter  Medications   diclofenac Sodium (VOLTAREN) 1 % GEL    Sig: Apply 2 g topically 4 (four) times daily.    Dispense:  100 g    Refill:  1    Order Specific Question:   Supervising Provider    Answer:   SClaretta Fraise[[[id number]    OIvy Lynn NP

## 2021-02-18 ENCOUNTER — Encounter: Payer: Self-pay | Admitting: Family Medicine

## 2021-02-18 ENCOUNTER — Ambulatory Visit (INDEPENDENT_AMBULATORY_CARE_PROVIDER_SITE_OTHER): Payer: 59 | Admitting: Family Medicine

## 2021-02-18 VITALS — BP 131/90 | HR 73 | Temp 98.2°F | Ht 74.0 in | Wt 274.2 lb

## 2021-02-18 DIAGNOSIS — Z87898 Personal history of other specified conditions: Secondary | ICD-10-CM

## 2021-02-18 DIAGNOSIS — J4540 Moderate persistent asthma, uncomplicated: Secondary | ICD-10-CM | POA: Diagnosis not present

## 2021-02-18 DIAGNOSIS — M47819 Spondylosis without myelopathy or radiculopathy, site unspecified: Secondary | ICD-10-CM | POA: Insufficient documentation

## 2021-02-18 MED ORDER — FLUTICASONE-SALMETEROL 250-50 MCG/ACT IN AEPB: 1.0000 | INHALATION_SPRAY | Freq: Two times a day (BID) | RESPIRATORY_TRACT | 12 refills | Status: DC

## 2021-02-18 MED ORDER — LEVALBUTEROL TARTRATE 45 MCG/ACT IN AERO
2.0000 | INHALATION_SPRAY | Freq: Four times a day (QID) | RESPIRATORY_TRACT | 1 refills | Status: DC | PRN
Start: 2021-02-18 — End: 2022-04-22

## 2021-02-18 NOTE — Progress Notes (Signed)
Subjective: CC: Asthma follow-up PCP: Leon Norlander, DO Leon Mason is a 30 y.o. male presenting to clinic today for:  1.  Asthma Patient with multiple ER visits for asthma flareups.  He notes that he resides at home with a smoker, which really does trigger his asthma.  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  Currently being treated with Humira for that.  On month 2 of the the injections.  He saw Ulice Dash recently and there was concern about ongoing refills of his albuterol, specifically because some QTC prolongation was noted on recent EKG.  He will be seeing a cardiologist at the behest of his rheumatologist soon.  He does not report any chest pain, shortness of breath or change in exercise tolerance normally but does get shortness of breath and wheezing when he is exposed to cigarette smoke.  He does feel that he needs to use albuterol multiple times per day.  Not currently treated with a prevention but was previously treated with Advair discus.  Now that he has good insurance he would be willing to start 1 of these again.   ROS: Per HPI  Allergies  Allergen Reactions   Other Hives and Shortness Of Breath    Dogs and Cats.   Dust Mite Extract    Pollen Extract    Past Medical History:  Diagnosis Date   Anxiety    Asthma    Bipolar 1 disorder ([facility])    Hallucinations    Schizoaffective disorder ([facility])    Schizophrenia ([facility])     Current Outpatient Medications:    acetaminophen (TYLENOL) 500 MG tablet, Take 1 tablet (500 mg total) by mouth every 6 (six) hours as needed., Disp: 30 tablet, Rfl: 0   Adalimumab (HUMIRA PEN) 40 MG/0.4ML PNKT, Inject 40 mg into the skin every 14 (fourteen) days., Disp: 3 each, Rfl: 0   albuterol (PROVENTIL) (2.5 MG/3ML) 0.083% nebulizer solution, Inhale 3 mLs (2.5 mg total) into the  lungs every 6 (six) hours as needed for wheezing or shortness of breath., Disp: 75 mL, Rfl: 1   albuterol (VENTOLIN HFA) 108 (90 Base) MCG/ACT inhaler, Inhale 1-2 puffs into the lungs every 6 (six) hours as needed for wheezing or shortness of breath., Disp: 1 each, Rfl: 1   diclofenac Sodium (VOLTAREN) 1 % GEL, Apply 2 g topically 4 (four) times daily., Disp: 100 g, Rfl: 1   IBUPROFEN PO, Take by mouth as needed., Disp: , Rfl:    Multiple Vitamin (MULTIVITAMIN PO), Take by mouth daily., Disp: , Rfl:    Omega-3 Fatty Acids (FISH OIL PO), Take by mouth daily., Disp: , Rfl:    perphenazine (TRILAFON) 2 MG tablet, Take 1 tablet (2 mg total) by mouth 3 (three) times daily. (Patient taking differently: Take 8 mg by mouth daily.), Disp: 90 tablet, Rfl: 2   sertraline (ZOLOFT) 50 MG tablet, Take 1 tablet (50 mg total) by mouth daily., Disp: 30 tablet, Rfl: 2 Social History   Socioeconomic History   Marital status: Single    Spouse name: Not on file   Number of children: Not on file   Years of education: Not on file   Highest education level: Not on file  Occupational History   Not on file  Tobacco Use   Smoking status: Every Day  Years: 0.50    Types: Cigarettes    Last attempt to quit: 05/28/2018    Years since quitting: 2.7   Smokeless tobacco: Former   Tobacco comments:    1 cigarette daily   Vaping Use   Vaping Use: Former  Substance and Sexual Activity   Alcohol use: Not Currently   Drug use: Not Currently    Types: Marijuana   Sexual activity: Not Currently  Other Topics Concern   Not on file  Social History Narrative   Not on file   Social Determinants of Health   Financial Resource Strain: Not on file  Food Insecurity: Not on file  Transportation Needs: Not on file  Physical Activity: Not on file  Stress: Not on file  Social Connections: Not on file  Intimate Partner Violence: Not on file   Family History  Problem Relation Age of Onset   Bipolar disorder Mother     Alcoholism Mother    Diabetes Mother    Cervical cancer Mother    OCD Father    Bipolar disorder Father    Eczema Brother    Post-traumatic stress disorder Brother    COPD Maternal Aunt    Heart attack Maternal Grandmother    Breast cancer Maternal Grandmother    Heart attack Maternal Grandfather    Cancer Paternal Grandmother    Heart attack Paternal Grandfather    Asthma Neg Hx    Allergic rhinitis Neg Hx    Angioedema Neg Hx    Urticaria Neg Hx    Immunodeficiency Neg Hx    Atopy Neg Hx    Prostate cancer Neg Hx    Colon cancer Neg Hx     Objective: Office vital signs reviewed. BP 131/90    Pulse 73    Temp 98.2 F (36.8 C)    Ht 6\' 2"  (1.88 m)    Wt 274 lb 3.2 oz (124.4 kg)    SpO2 94%    BMI 35.21 kg/m   Physical Examination:  General: Awake, alert, nontoxic-appearing male, No acute distress HEENT: Sclera white.  Moist mucous membranes Cardio: regular rate and rhythm, S1S2 heard, no murmurs appreciated Pulm: clear to auscultation bilaterally, no wheezes, rhonchi or rales; normal work of breathing on room air MSK: Gait is antalgic  Assessment/ Plan: 30 y.o. male   Moderate persistent asthma without complication - Plan: fluticasone-salmeterol (ADVAIR) 250-50 MCG/ACT AEPB, levalbuterol (XOPENEX HFA) 45 MCG/ACT inhaler  History of prolonged Q-T interval on ECG - Plan: levalbuterol (XOPENEX HFA) 45 MCG/ACT inhaler  Spondyloarthropathy  I would like to start him on Advair for prevention.  Advised to rinse mouth after each use.  I am going to switch his albuterol to Xopenex in hopes that he will have less impact on his heart rate and rhythm.  May need to consider something like Singulair as well.  There would be more hesitant to use as it may exacerbate mental health issues  We discussed that his Trilafon could also exacerbate QTC prolongation.  Caution.  I reviewed his most recent office note with rheumatology.  Continue treatment as directed with Humira.  No orders  of the defined types were placed in this encounter.  No orders of the defined types were placed in this encounter.    Leon Norlander, DO [place] [phone]

## 2021-02-18 NOTE — Patient Instructions (Signed)
I'm going to try and get you Xopenex instead of Ventolin.    The change that was seen on your last EKG was something called QT prolongation.  Certain medications including Albuterol, can cause this to be worse and can send your heart into an abnormal rhythm.  Xopenex is less likely to do this but relieves acute asthma flares just as good as Ventolin.  I have also ordered generic Advair to help REDUCE need for the rescue inhaler.

## 2021-03-09 ENCOUNTER — Other Ambulatory Visit: Payer: Self-pay | Admitting: Adult Health

## 2021-03-09 DIAGNOSIS — G4700 Insomnia, unspecified: Secondary | ICD-10-CM

## 2021-03-09 DIAGNOSIS — F331 Major depressive disorder, recurrent, moderate: Secondary | ICD-10-CM

## 2021-03-09 DIAGNOSIS — F411 Generalized anxiety disorder: Secondary | ICD-10-CM

## 2021-03-09 DIAGNOSIS — F203 Undifferentiated schizophrenia: Secondary | ICD-10-CM

## 2021-03-10 ENCOUNTER — Telehealth: Payer: Self-pay | Admitting: Adult Health

## 2021-03-10 NOTE — Telephone Encounter (Signed)
Rx sent 

## 2021-03-10 NOTE — Telephone Encounter (Signed)
Next visit is 03/12/21. Requesting refill on Zoloft 150 mg called to:  [facility] [address]       Phone:  [phone]  Fax:  [phone]

## 2021-03-12 ENCOUNTER — Ambulatory Visit: Payer: Self-pay | Admitting: Adult Health

## 2021-03-12 NOTE — Progress Notes (Signed)
Patient no show appointment. ? ?

## 2021-03-13 ENCOUNTER — Other Ambulatory Visit: Payer: Self-pay

## 2021-03-13 ENCOUNTER — Telehealth: Payer: Self-pay | Admitting: Adult Health

## 2021-03-13 DIAGNOSIS — F203 Undifferentiated schizophrenia: Secondary | ICD-10-CM

## 2021-03-13 MED ORDER — PERPHENAZINE 2 MG PO TABS: 2.0000 mg | ORAL_TABLET | Freq: Three times a day (TID) | ORAL | 0 refills | Status: DC

## 2021-03-13 NOTE — Telephone Encounter (Signed)
Pt called requesting refill of Perphenazine sent to  [facility], [place], [place] [address]  Phone:  [phone]  Fax:  [phone]   No upcoming appt scheduled.  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  He had to take her to [facility].  He is out of his meds.

## 2021-03-13 NOTE — Telephone Encounter (Signed)
Rx sent 

## 2021-03-13 NOTE — Telephone Encounter (Signed)
Yes

## 2021-03-13 NOTE — Telephone Encounter (Signed)
Ok to send

## 2021-04-09 ENCOUNTER — Encounter: Payer: 59 | Admitting: Family Medicine

## 2021-04-12 ENCOUNTER — Other Ambulatory Visit: Payer: Self-pay | Admitting: Adult Health

## 2021-04-12 DIAGNOSIS — G4700 Insomnia, unspecified: Secondary | ICD-10-CM

## 2021-04-12 DIAGNOSIS — F331 Major depressive disorder, recurrent, moderate: Secondary | ICD-10-CM

## 2021-04-12 DIAGNOSIS — F203 Undifferentiated schizophrenia: Secondary | ICD-10-CM

## 2021-04-12 DIAGNOSIS — F411 Generalized anxiety disorder: Secondary | ICD-10-CM

## 2021-04-13 ENCOUNTER — Other Ambulatory Visit: Payer: Self-pay | Admitting: Adult Health

## 2021-04-13 DIAGNOSIS — F411 Generalized anxiety disorder: Secondary | ICD-10-CM

## 2021-04-13 DIAGNOSIS — F331 Major depressive disorder, recurrent, moderate: Secondary | ICD-10-CM

## 2021-04-13 DIAGNOSIS — F203 Undifferentiated schizophrenia: Secondary | ICD-10-CM

## 2021-04-13 DIAGNOSIS — G4700 Insomnia, unspecified: Secondary | ICD-10-CM

## 2021-04-14 ENCOUNTER — Other Ambulatory Visit: Payer: Self-pay | Admitting: *Deleted

## 2021-04-14 MED ORDER — HUMIRA (2 PEN) 40 MG/0.4ML ~~LOC~~ AJKT: 40.0000 mg | AUTO-INJECTOR | SUBCUTANEOUS | 0 refills | Status: DC

## 2021-04-14 NOTE — Telephone Encounter (Signed)
Refill request received via fax  Next Visit: 05/08/2021  Last Visit: 02/06/2021  Last Fill: 02/06/2021  UY:[id number]   Current Dose per office note 02/06/2021: Started Humira on 12/26/20   Labs: 02/06/2021 CBC and CMP are normal.  TB Gold: 12/04/2020 Neg    Okay to refill Humira?

## 2021-04-15 ENCOUNTER — Telehealth: Payer: Self-pay | Admitting: Rheumatology

## 2021-04-15 NOTE — Telephone Encounter (Signed)
Spoke with Vonna Kotyk at [facility] and advised that patient does not need a loading dose. Advised them of the diagnosis code.

## 2021-04-15 NOTE — Telephone Encounter (Signed)
Lonnie from [facility] pharmacy left a voicemail regarding a prescription they received for the patient. They state they needed some more information on the prescription. They stated since he is a new patient to them they wanted to make sure he didn't need a loading dose for Humira Pen 40mg  /0.93ml.  Phone # [phone]

## 2021-04-16 ENCOUNTER — Ambulatory Visit: Payer: Self-pay | Admitting: Adult Health

## 2021-04-16 NOTE — Progress Notes (Signed)
Patient no show appointment. ? ?

## 2021-04-20 ENCOUNTER — Telehealth: Payer: Self-pay | Admitting: Rheumatology

## 2021-04-20 DIAGNOSIS — Z79899 Other long term (current) drug therapy: Secondary | ICD-10-CM

## 2021-04-20 DIAGNOSIS — M47819 Spondylosis without myelopathy or radiculopathy, site unspecified: Secondary | ICD-10-CM

## 2021-04-20 DIAGNOSIS — H209 Unspecified iridocyclitis: Secondary | ICD-10-CM

## 2021-04-20 NOTE — Telephone Encounter (Signed)
Patient called the office stating he has a new insurance and needs a new prescription for Humira sent to [facility].  Phone [phone]

## 2021-04-21 ENCOUNTER — Telehealth: Payer: Self-pay

## 2021-04-21 NOTE — Telephone Encounter (Signed)
Patient called stating he copied his new insurance card [employer] in [place].  Patient states the prescription of Humira needs to be filled through [facility].

## 2021-04-21 NOTE — Telephone Encounter (Signed)
See previous phone note for details 

## 2021-04-21 NOTE — Telephone Encounter (Signed)
Attempted to contact the patient and left message for patient to call the office. Patient advised we will need new insurance information as we will need to complete a benefots investigation for the Humira with the new insurance.

## 2021-04-22 ENCOUNTER — Other Ambulatory Visit: Payer: Self-pay

## 2021-04-22 ENCOUNTER — Encounter: Payer: Self-pay | Admitting: Adult Health

## 2021-04-22 ENCOUNTER — Ambulatory Visit (INDEPENDENT_AMBULATORY_CARE_PROVIDER_SITE_OTHER): Payer: 59 | Admitting: Adult Health

## 2021-04-22 DIAGNOSIS — F203 Undifferentiated schizophrenia: Secondary | ICD-10-CM

## 2021-04-22 DIAGNOSIS — F411 Generalized anxiety disorder: Secondary | ICD-10-CM | POA: Diagnosis not present

## 2021-04-22 DIAGNOSIS — R9431 Abnormal electrocardiogram [ECG] [EKG]: Secondary | ICD-10-CM

## 2021-04-22 DIAGNOSIS — F331 Major depressive disorder, recurrent, moderate: Secondary | ICD-10-CM

## 2021-04-22 DIAGNOSIS — G4700 Insomnia, unspecified: Secondary | ICD-10-CM | POA: Diagnosis not present

## 2021-04-22 MED ORDER — SERTRALINE HCL 50 MG PO TABS: 50.0000 mg | ORAL_TABLET | Freq: Every day | ORAL | 5 refills | Status: DC

## 2021-04-22 MED ORDER — PERPHENAZINE 4 MG PO TABS: 4.0000 mg | ORAL_TABLET | Freq: Two times a day (BID) | ORAL | 5 refills | Status: DC

## 2021-04-22 NOTE — Telephone Encounter (Signed)
Submitted a Prior Authorization request to [facility] for HUMIRA via FAX on CoverMyMeds. Will update once we receive a response.  Key: [id number]  Knox Saliva, PharmD, MPH, BCPS Clinical Pharmacist (Rheumatology and Pulmonology)

## 2021-04-22 NOTE — Progress Notes (Signed)
Leon Mason [id number] September 26, 1990 31 y.o.  Subjective:   Patient ID:  Leon Mason is a 31 y.o. (DOB 1990/08/19) male.  Chief Complaint: No chief complaint on file.   HPI [name] presents to the office today for follow-up of MDD, GAD, insomnia, Schizophrenia.  Describes mood today as "improved". Pleasant. Mood symptoms - reports depression, anxiety, and irritability. Feels hopeless - unmotivated. Stating "I don't want to give up". Surrounded by people that are ignorant - been to prison. It is a struggle to stay motivated when I don't have the tools to succeed. Running into the same issues where ever he goes - under paid and under recognized. Trying to manage day by day. 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 Stating "being poor and having schizophrenia is very difficult". Feels he is highly intelligent person - a [employer] plant. Denies alcohol use for 6 months. Vaping - smoking 2 cigarettes a day. Concerned about health issues. Diagnosed with ankylosing spondylitis - taking Humira for the past 4 to 5 months - "not hurting as much". Living at home with mother and brother - helping mother out with her care. Denies THC and ETOH. Stable interest and motivation. Seeing [name] for therapy. Energy levels lower. Active, does not have a regular exercise routine. Enjoys some usual interests and activities. Single. Lives with mother (disabled) - brother 2 cats and a dog. Spending time with family - Aunt and uncle local.  Appetite adequate. Weight loss 275 pounds. Sleeping better some nights than others. Averages 6 hours. Taking a nap every other day. Focus and concentration stable. Completing tasks. Managing aspects of household. Currently unemployed - quit job at [employer]. Not in school currently. Denies SI or HI.  Denies AH or VH. Paranoid - thoughts that mother and brother trying to kill me - missed medications for 5 days - had a period of time where he ran out.  Tries to be consistent with medication. Feels like he does need an increase in Perphenazine.  Previous medications: Perphenazine, Lithium, Sertraline, Haldol, Zyprexa, Risperdal, stopped due to various side effects, ie biting tongue a lot, restless legs, weight gain    [facility] Office Visit from 02/16/2021 in [place] Visit from 10/21/2020 in [place]  Total GAD-7 Score 16 13      PHQ2-9    [place] Office Visit from 02/16/2021 in [place] Visit from 10/21/2020 in [place] Office Visit from 12/10/2017 in [place] Visit from 12/17/2016 in [place]  PHQ-2 Total Score 3 6 3  0  PHQ-9 Total Score 7 17 8  --      Flowsheet Row ED from 08/25/2020 in [facility] [place] [facility] from 08/02/2020 in [facility] from 06/24/2020 in [facility] [place] No Risk No Risk No Risk        Review of Systems:  Review of Systems  Musculoskeletal:  Negative for gait problem.  Neurological:  Negative for tremors.  Psychiatric/Behavioral:         Please refer to HPI   Medications: I have reviewed the patient's current medications.  Current Outpatient Medications  Medication Sig Dispense Refill   perphenazine (TRILAFON) 4 MG tablet Take 1 tablet (4 mg total) by mouth 2 (two) times daily. 60 tablet 5   acetaminophen (  TYLENOL) 500 MG tablet Take 1 tablet (500 mg total) by mouth every 6 (six) hours as needed. 30 tablet 0   Adalimumab (HUMIRA PEN) 40 MG/0.4ML PNKT Inject 40 mg into the skin every 14 (fourteen) days. 3 each 0   albuterol (PROVENTIL) (2.5 MG/3ML) 0.083% nebulizer solution Inhale 3 mLs (2.5 mg total) into the lungs every 6 (six) hours as needed for wheezing or shortness of breath. 75 mL 1   diclofenac Sodium (VOLTAREN) 1 %  GEL Apply 2 g topically 4 (four) times daily. 100 g 1   fluticasone-salmeterol (ADVAIR) 250-50 MCG/ACT AEPB Inhale 1 puff into the lungs in the morning and at bedtime. Rinse mouth after each use 60 each 12   IBUPROFEN PO Take by mouth as needed.     levalbuterol (XOPENEX HFA) 45 MCG/ACT inhaler Inhale 2 puffs into the lungs every 6 (six) hours as needed for wheezing or shortness of breath. 1 each 1   Multiple Vitamin (MULTIVITAMIN PO) Take by mouth daily.     sertraline (ZOLOFT) 50 MG tablet Take 1 tablet (50 mg total) by mouth daily. 30 tablet 5   No current facility-administered medications for this visit.    Medication Side Effects: None  Allergies:  Allergies  Allergen Reactions   Other Hives and Shortness Of Breath    Dogs and Cats.   Dust Mite Extract    Pollen Extract     Past Medical History:  Diagnosis Date   Anxiety    Asthma    Bipolar 1 disorder ([place])    Hallucinations    Schizoaffective disorder (HCC)    Schizophrenia (HCC)     Past Medical History, Surgical history, Social history, and Family history were reviewed and updated as appropriate.   Please see review of systems for further details on the patient's review from today.   Objective:   Physical Exam:  There were no vitals taken for this visit.  Physical Exam Constitutional:      General: He is not in acute distress. Musculoskeletal:        General: No deformity.  Neurological:     Mental Status: He is alert and oriented to person, place, and time.     Coordination: Coordination normal.  Psychiatric:        Attention and Perception: Attention and perception normal. He does not perceive auditory or visual hallucinations.        Mood and Affect: Mood normal. Mood is not anxious or depressed. Affect is not labile, blunt, angry or inappropriate.        Speech: Speech normal.        Behavior: Behavior normal.        Thought Content: Thought content normal. Thought content is not paranoid or  delusional. Thought content does not include homicidal or suicidal ideation. Thought content does not include homicidal or suicidal plan.        Cognition and Memory: Cognition and memory normal.        Judgment: Judgment normal.     Comments: Insight intact    Lab Review:     Component Value Date/Time   NA 140 02/06/2021 1045   K 3.9 02/06/2021 1045   CL 102 02/06/2021 1045   CO2 27 02/06/2021 1045   GLUCOSE 84 02/06/2021 1045   BUN 9 02/06/2021 1045   CREATININE 0.83 02/06/2021 1045   CALCIUM 9.7 02/06/2021 1045   PROT 7.5 02/06/2021 1045   ALBUMIN 4.0 02/18/2019 0412   AST 18 02/06/2021 1045  ALT 37 02/06/2021 1045   ALKPHOS 55 02/18/2019 0412   BILITOT 0.7 02/06/2021 1045   GFRNONAA >60 02/19/2019 0602   GFRAA >60 02/19/2019 0602       Component Value Date/Time   WBC 7.6 02/06/2021 1045   RBC 5.38 02/06/2021 1045   HGB 16.3 02/06/2021 1045   HCT 49.5 02/06/2021 1045   PLT 214 02/06/2021 1045   MCV 92.0 02/06/2021 1045   MCH 30.3 02/06/2021 1045   MCHC 32.9 02/06/2021 1045   RDW 14.1 02/06/2021 1045   LYMPHSABS 2,690 02/06/2021 1045   MONOABS 0.4 02/19/2019 0602   EOSABS 319 02/06/2021 1045   BASOSABS 91 02/06/2021 1045    Lithium Lvl  Date Value Ref Range Status  03/30/2018 <0.06 (L) 0.60 - 1.20 mmol/L Final    Comment:    REPEATED TO VERIFY Performed at [facility], [place] [address] [place], [place] [address]      No results found for: PHENYTOIN, PHENOBARB, VALPROATE, CBMZ   .res Assessment: Plan:    Plan:  PDMP reviewed  1. Continue Zoloft 50mg   2. Increase Perphenazine 2mg  TID to 4mg  at at hs.  Restart therapy - Andy Mitchum.  Patient noted to have a prolonged QT interval on EKG in 2020 - will repeat EKG for comparison - order placed and patient notified 07/20/2020.   RTC 3 months  Patient advised to contact office with any questions, adverse effects, or acute worsening in signs and symptoms.  Diagnoses and all orders  for this visit:  Undifferentiated schizophrenia ([place]) -     sertraline (ZOLOFT) 50 MG tablet; Take 1 tablet (50 mg total) by mouth daily. -     perphenazine (TRILAFON) 4 MG tablet; Take 1 tablet (4 mg total) by mouth 2 (two) times daily. -     EKG 12-Lead -     EKG 12-Lead  Generalized anxiety disorder -     sertraline (ZOLOFT) 50 MG tablet; Take 1 tablet (50 mg total) by mouth daily.  Insomnia, unspecified type -     sertraline (ZOLOFT) 50 MG tablet; Take 1 tablet (50 mg total) by mouth daily.  Major depressive disorder, recurrent episode, moderate (HCC) -     sertraline (ZOLOFT) 50 MG tablet; Take 1 tablet (50 mg total) by mouth daily.     Please see After Visit Summary for patient specific instructions.  Future Appointments  Date Time Provider [place]  05/04/2021  3:15 PM [name], DO [facility] None  05/08/2021  9:40 AM Ofilia Neas, PA-C [facility] None  10/20/2021  2:00 PM Donney Caraveo, Berdie Ogren, NP [facility] None    Orders Placed This Encounter  Procedures   EKG 12-Lead   EKG 12-Lead    -------------------------------

## 2021-04-23 NOTE — Addendum Note (Signed)
Addended by: Aloha Gell on: 04/23/2021 05:08 PM   Modules accepted: Orders

## 2021-04-24 NOTE — Progress Notes (Signed)
Office Visit Note  Patient: Leon Mason             Date of Birth: [birth year]           MRN: [id number]             PCP: [name], DO Referring: [name], DO Visit Date: 05/08/2021 Occupation: '@GUAROCC' @  Subjective:  Neck pain   History of Present Illness: Leon Mason is a 32 y.o. male with history of spondyloarthropathy and iritis.  Patient is prescribed Humira 40 mg sq injections every 14 days but he has been out of the medication for a month.  He will be receiving his shipment of Humira today and plans on resuming therapy today.  He states that while off of Humira as he has had increased pain and stiffness in his neck and lower back.  He has tried several pillows but continues to have nocturnal pain in his neck.  He states he is also been having increased discomfort in the left SI joint with some radiating pain into the left buttocks over the past 1 week.  He denies any recent injury or fall.  He denies any joint swelling at this time.  He has not had any Achilles tendinitis or planter fasciitis.  He denies any recent rashes.  He denies any iritis flares.  He has not had any recent infections.    Activities of Daily Living:  Patient reports morning stiffness for 2-3 hours.   Patient Reports nocturnal pain.  Difficulty dressing/grooming: Denies Difficulty climbing stairs: Denies Difficulty getting out of chair: Reports Difficulty using hands for taps, buttons, cutlery, and/or writing: Denies  Review of Systems  Constitutional:  Negative for fatigue.  HENT:  Negative for mouth sores, mouth dryness and nose dryness.   Eyes:  Positive for photophobia and pain. Negative for itching, visual disturbance and dryness.  Respiratory:  Positive for shortness of breath. Negative for difficulty breathing.   Cardiovascular:  Positive for palpitations. Negative for chest pain.  Gastrointestinal:  Negative for blood in stool, constipation and diarrhea.  Endocrine:  Negative for increased urination.  Genitourinary:  Negative for difficulty urinating.  Musculoskeletal:  Positive for joint pain, joint pain, myalgias, morning stiffness, muscle tenderness and myalgias. Negative for joint swelling.  Skin:  Negative for color change, rash and redness.  Allergic/Immunologic: Negative for susceptible to infections.  Neurological:  Positive for numbness and headaches. Negative for dizziness, memory loss and weakness.  Hematological:  Negative for bruising/bleeding tendency.  Psychiatric/Behavioral:  Negative for confusion.    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   Back pain without sciatica 02/16/2021   Neck pain 02/16/2021   Generalized joint pain 10/21/2020   Generalized abdominal pain 10/21/2020   Mild intermittent asthma with (acute) exacerbation 02/18/2019   Hypokalemia 02/18/2019   Normocytic anemia 02/18/2019   Elevated transaminase level 02/18/2019   Prolonged QT interval 02/18/2019   Viral syndrome 02/18/2019   SIRS (systemic inflammatory response syndrome) ([place]) 02/18/2019   Globus sensation 07/04/2018   Schizophrenia ([place]) 03/28/2018   MDD (major depressive disorder), recurrent episode, severe ([place]) 03/28/2018   Schizo-affective schizophrenia, chronic condition with acute exacerbation ([place]) 03/28/2018   Tobacco use disorder 12/17/2016   Schizoaffective disorder, bipolar type ([place]) 08/23/2016   Alcohol use disorder, severe, dependence ([place]) 08/23/2016   Allergic rhinitis due to pollen 10/09/2015   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   Mild persistent allergic asthma 10/09/2015  Psychosis ([place]) 06/14/2013    Past Medical History:  Diagnosis Date   Anxiety    Asthma    Bipolar 1 disorder ([place])    Hallucinations    Schizoaffective disorder ([place])    Schizophrenia ([place])     Family History  Problem Relation Age of Onset   Bipolar disorder Mother    Alcoholism Mother    Diabetes  Mother    Cervical cancer Mother    OCD Father    Bipolar disorder Father    Eczema Brother    Post-traumatic stress disorder Brother    COPD Maternal Aunt    Heart attack Maternal Grandmother    Breast cancer Maternal Grandmother    Heart attack Maternal Grandfather    Cancer Paternal Grandmother    Heart attack Paternal Grandfather    Asthma Neg Hx    Allergic rhinitis Neg Hx    Angioedema Neg Hx    Urticaria Neg Hx    Immunodeficiency Neg Hx    Atopy Neg Hx    Prostate cancer Neg Hx    Colon cancer Neg Hx    Past Surgical History:  Procedure Laterality Date   WISDOM TOOTH EXTRACTION  2009   all four   Social History   Social History Narrative   Not on file   Immunization History  Administered Date(s) Administered   DTaP 01/03/1990, 03/17/1990, 06/08/1990, 02/20/1991, 06/01/1994   Hepatitis B 01/05/2001, 02/01/2001, 05/04/2005   Hepatitis B, ped/adol 01/05/2001, 02/01/2001, 05/04/2005   HiB (PRP-OMP) 01/03/1990, 03/17/1990, 06/08/1990, 02/20/1991   IPV 01/03/1990, 03/17/1990, 02/20/1991, 06/01/1994   Influenza,inj,Quad PF,6+ Mos 02/20/2019, 02/16/2021   Influenza,inj,quad, With Preservative 03/18/2018   Influenza-Unspecified 03/18/2018   MMR 02/20/1991, 06/01/1994   Meningococcal Conjugate 05/04/2005   Pneumococcal Polysaccharide-23 08/29/2016, 02/20/2019   Tdap 05/04/2005     Objective: Vital Signs: BP 123/79 (BP Location: Right Arm, Patient Position: Sitting, Cuff Size: Large)    Pulse 76    Ht '6\' 3"'  (1.905 m)    Wt 277 lb (125.6 kg)    BMI 34.62 kg/m    Physical Exam Vitals and nursing note reviewed.  Constitutional:      Appearance: He is well-developed.  HENT:     Head: Normocephalic and atraumatic.  Eyes:     Conjunctiva/sclera: Conjunctivae normal.     Pupils: Pupils are equal, round, and reactive to light.  Pulmonary:     Effort: Pulmonary effort is normal.  Abdominal:     Palpations: Abdomen is soft.  Musculoskeletal:     Cervical back:  Normal range of motion and neck supple.  Skin:    General: Skin is warm and dry.     Capillary Refill: Capillary refill takes less than 2 seconds.  Neurological:     Mental Status: He is alert and oriented to person, place, and time.  Psychiatric:        Behavior: Behavior normal.     Musculoskeletal Exam: Stiffness and pain with lateral rotation of the C-spine and extension.  No midline spinal tenderness.  Tenderness over the left SI joint.  Thoracic kyphosis was noted.  Shoulder joints, elbow joints, wrist joints, MCPs, PIPs, DIPs have good range of motion with no synovitis.  Mild flexion contracture of the left fourth PIP joint.  Hip joints have good range of motion with no groin pain.  Knee joints have good range of motion with no warmth or effusion.  Ankle joints have good range of motion with no tenderness or joint swelling.  No  evidence of Achilles tendinitis or plantar fasciitis.  No tenderness over MTP joints.  CDAI Exam: CDAI Score: -- Patient Global: --; Provider Global: -- Swollen: --; Tender: -- Joint Exam 05/08/2021   No joint exam has been documented for this visit   There is currently no information documented on the homunculus. Go to the Rheumatology activity and complete the homunculus joint exam.  Investigation: No additional findings.  Imaging: No results found.  Recent Labs: Lab Results  Component Value Date   WBC 5.7 05/06/2021   HGB 16.4 05/06/2021   PLT 187 05/06/2021   NA 138 05/06/2021   K 4.2 05/06/2021   CL 101 05/06/2021   CO2 24 05/06/2021   GLUCOSE 89 05/06/2021   BUN 7 05/06/2021   CREATININE 0.90 05/06/2021   BILITOT 0.5 05/06/2021   ALKPHOS 56 05/06/2021   AST 16 05/06/2021   ALT 25 05/06/2021   PROT 7.7 05/06/2021   ALBUMIN 5.0 05/06/2021   CALCIUM 9.9 05/06/2021   GFRAA >60 02/19/2019   QFTBGOLDPLUS NEGATIVE 12/04/2020    Speciality Comments: Humira started December 26, 2020  Procedures:  No procedures performed Allergies:  Other, Dust mite extract, and Pollen extract   Assessment / Plan:     Visit Diagnoses: Spondyloarthropathy - Inflammatory arthritis, iritis, chronic SI joint pain, chronic pain in neck, thoracic and lumbar region.HLA-B27 positive: He presents today with increased pain and stiffness in the C-spine and the left SI joint.  He has been experiencing increased nocturnal pain in his neck so a prescription for especially medical pillow was provided today.  He has also been having left-sided sciatica symptoms for the past 1 week.  He has been off of Humira for the past 1 month due to a delay in the renewal process for 2023.  He will be receiving his shipment of Humira today and plans on resuming therapy as prescribed.  He has no synovitis or dactylitis on examination.  No evidence of Achilles tendinitis or planter fasciitis.  He has not had any recent iritis flares.  No recent rashes. For his current flare a prednisone taper starting at 20 mg tapering by 5 mg every 4 days was sent to the pharmacy.  Instructions were provided.  He will remain on Humira as prescribed.  He was advised to notify us if his symptoms persist or worsen.  He will follow-up in the office in 3 months.  High risk medication use -Humira 40 mg sq injections every 14 days-initiated Humira on 12/26/20.  An adequate response and GI intolerance to NSAIDs in the past. CBC and CMP were drawn on 05/06/2021.  His next lab work will be due in June and every 3 months to monitor for drug toxicity.  TB Gold negative on 12/04/2020 and will continue to be monitored yearly. He has not had any recent infections.  Discussed the importance of holding humira if he develops signs or symptoms of an infection and to resume once the infection has completely cleared. Discussed the importance of yearly skin examinations while on Humira due to the increased risk for nonmelanoma skin cancers.  HLA B27 positive  Chronic pain of left knee: He has good range of motion of the  left knee joint on examination.  No warmth or effusion was noted.  He was encouraged to try to increase his exercise as tolerated.   Iritis - No recurrence since the start of Humira.  No conjunctival injection was noted.  He is not experiencing any photophobia or eye pain  at this time.  He will remain on Humira as prescribed.  He was advised to notify us if he develops signs or symptoms of a flare.  Neck pain - X-rays were unremarkable in the past.  He has painful range of motion and some stiffness with lateral rotation and extension of the C-spine.  No symptoms of radiculopathy at this time.  A prescription for a specialty medical pillow was provided to the patient which he plans on taking to the medical supply store.  He has tried several different pillows but continues to have nocturnal pain in his neck.  Pain in thoracic spine - He has postural kyphosis.  No midline spinal tenderness at this time.  Chronic SI joint pain: He has tenderness outpatient over the left SI joint on examination today.  He is having some radiating pain to the left buttocks.  His symptoms started 1 week ago with no injury or fall prior to the onset of symptoms.  He has been off of Humira for the past 1 month.  A prednisone taper starting at 20 mg tapering by 5 mg every 4 days was sent to the pharmacy.  Instructions were provided.  He was encouraged to take prednisone in the mornings with breakfast and to avoid the use of NSAIDs.  He was advised to notify us if his symptoms persist or worsen.  He plans on resuming Humira injections today.  Elevated transaminase level - He no longer drinks alcohol.  AST and ALT were within normal limits on 05/06/2021.  Other fatigue: Stable discussed the importance of increasing his exercise regimen as tolerated.  I also discussed the importance of focusing on good sleep hygiene.  Other medical conditions are listed as follows:  Prolonged QT interval: EKG updated on 04/27/2021.  Allergic  urticaria  Schizoaffective disorder, bipolar type (HCC)  SIRS (systemic inflammatory response syndrome) (HCC)  Mild persistent allergic asthma  Palpitations - referred to cardiology at the last visit.  Patient EKG on 04/27/2021.  Smoker - only 1 cig / day. Quit marijuana 1 month ago.  Alcohol use disorder, severe, dependence ([place]) -He has quit drinking alcohol.  Orders: No orders of the defined types were placed in this encounter.  Meds ordered this encounter  Medications   predniSONE (DELTASONE) 5 MG tablet    Sig: Take 4 tablets by mouth daily x4 days, 3 tablets daily x4 days, 2 tablets daily x4 days, 1 tablet daily x4 days.    Dispense:  40 tablet    Refill:  0      Follow-Up Instructions: Return in about 3 months (around 08/08/2021) for Spondyloarthropathy, iritis.   Ofilia Neas, PA-C  Note - This record has been created using Dragon software.  Chart creation errors have been sought, but may not always  have been located. Such creation errors do not reflect on  the standard of medical care.

## 2021-04-27 ENCOUNTER — Ambulatory Visit: Payer: 59 | Admitting: Family Medicine

## 2021-04-27 ENCOUNTER — Telehealth: Payer: Self-pay | Admitting: Rheumatology

## 2021-04-27 DIAGNOSIS — R9431 Abnormal electrocardiogram [ECG] [EKG]: Secondary | ICD-10-CM

## 2021-04-27 NOTE — Telephone Encounter (Signed)
Patient called the office stating he had requested a refill of Humira a few weeks ago. Patient states he called the pharmacy we sent it to and they are not in network with his insurance. Patients new pharmacy for [facility] [phone]. Patient states they have already transferred the prescription.

## 2021-04-27 NOTE — Telephone Encounter (Signed)
[facility] has been added to preferred pharmacies.

## 2021-04-27 NOTE — Progress Notes (Signed)
Patient came in today for EKG. EKG done and sent to ordering provider.

## 2021-04-30 ENCOUNTER — Encounter: Payer: Self-pay | Admitting: Pharmacist

## 2021-04-30 MED ORDER — HUMIRA (2 PEN) 40 MG/0.4ML ~~LOC~~ AJKT: 40.0000 mg | AUTO-INJECTOR | SUBCUTANEOUS | 0 refills | Status: DC

## 2021-04-30 NOTE — Telephone Encounter (Signed)
Called [employer] for prior auth status for Humira. They state that they do not see prior authorization on file. Initiated URGENT prior authorization for Humira via phone - turnaround time is 24 hours.  Phone: [phone]  Knox Saliva, PharmD, MPH, BCPS Clinical Pharmacist (Rheumatology and Pulmonology)

## 2021-04-30 NOTE — Addendum Note (Signed)
Addended by: Cassandria Anger on: 04/30/2021 11:27 AM   Modules accepted: Orders

## 2021-04-30 NOTE — Telephone Encounter (Signed)
Received notification from  [employer]  regarding a prior authorization for HUMIRA. Authorization has been APPROVED from 04/30/21 to 04/30/22.   Patient must fill through  [place]  [phone])  Authorization # [id number]  [facility] to provide them with copay card information but they state they do not have patient in their system. Rep made patient's account on phone and added copay card information, but will e-scribe Humira.  Knox Saliva, PharmD, MPH, BCPS Clinical Pharmacist (Rheumatology and Pulmonology)

## 2021-05-04 ENCOUNTER — Ambulatory Visit: Payer: 59 | Admitting: Family Medicine

## 2021-05-06 ENCOUNTER — Ambulatory Visit (INDEPENDENT_AMBULATORY_CARE_PROVIDER_SITE_OTHER): Payer: 59 | Admitting: Family Medicine

## 2021-05-06 ENCOUNTER — Telehealth: Payer: Self-pay | Admitting: Family Medicine

## 2021-05-06 ENCOUNTER — Encounter: Payer: Self-pay | Admitting: Family Medicine

## 2021-05-06 VITALS — BP 136/87 | HR 80 | Temp 97.2°F | Ht 74.0 in | Wt 276.8 lb

## 2021-05-06 DIAGNOSIS — Z6835 Body mass index (BMI) 35.0-35.9, adult: Secondary | ICD-10-CM

## 2021-05-06 DIAGNOSIS — Z0001 Encounter for general adult medical examination with abnormal findings: Secondary | ICD-10-CM

## 2021-05-06 DIAGNOSIS — A630 Anogenital (venereal) warts: Secondary | ICD-10-CM | POA: Diagnosis not present

## 2021-05-06 DIAGNOSIS — J301 Allergic rhinitis due to pollen: Secondary | ICD-10-CM | POA: Diagnosis not present

## 2021-05-06 DIAGNOSIS — M47819 Spondylosis without myelopathy or radiculopathy, site unspecified: Secondary | ICD-10-CM

## 2021-05-06 DIAGNOSIS — E6609 Other obesity due to excess calories: Secondary | ICD-10-CM

## 2021-05-06 DIAGNOSIS — F200 Paranoid schizophrenia: Secondary | ICD-10-CM

## 2021-05-06 DIAGNOSIS — Z0000 Encounter for general adult medical examination without abnormal findings: Secondary | ICD-10-CM

## 2021-05-06 LAB — BAYER DCA HB A1C WAIVED: HB A1C (BAYER DCA - WAIVED): 4.9 % (ref 4.8–5.6)

## 2021-05-06 MED ORDER — LEVOCETIRIZINE DIHYDROCHLORIDE 5 MG PO TABS: 5.0000 mg | ORAL_TABLET | Freq: Every evening | ORAL | 3 refills | Status: DC

## 2021-05-06 MED ORDER — IMIQUIMOD 5 % EX CREA: TOPICAL_CREAM | CUTANEOUS | 3 refills | Status: DC

## 2021-05-06 NOTE — Telephone Encounter (Signed)
This was completed 1pm ?

## 2021-05-06 NOTE — Patient Instructions (Signed)
You had labs performed today.  You will be contacted with the results of the labs once they are available, usually in the next 3 business days for routine lab work.  If you have an active my chart account, they will be released to your MyChart.  If you prefer to have these labs released to you via telephone, please let us know. ? ?If you had a pap smear or biopsy performed, expect to be contacted in about 7-10 days. ? ?Health Maintenance, Male ?Adopting a healthy lifestyle and getting preventive care are important in promoting health and wellness. Ask your health care provider about: ?The right schedule for you to have regular tests and exams. ?Things you can do on your own to prevent diseases and keep yourself healthy. ?What should I know about diet, weight, and exercise? ?Eat a healthy diet ? ?Eat a diet that includes plenty of vegetables, fruits, low-fat dairy products, and lean protein. ?Do not eat a lot of foods that are high in solid fats, added sugars, or sodium. ?Maintain a healthy weight ?Body mass index (BMI) is a measurement that can be used to identify possible weight problems. It estimates body fat based on height and weight. Your health care provider can help determine your BMI and help you achieve or maintain a healthy weight. ?Get regular exercise ?Get regular exercise. This is one of the most important things you can do for your health. Most adults should: ?Exercise for at least 150 minutes each week. The exercise should increase your heart rate and make you sweat (moderate-intensity exercise). ?Do strengthening exercises at least twice a week. This is in addition to the moderate-intensity exercise. ?Spend less time sitting. Even light physical activity can be beneficial. ?Watch cholesterol and blood lipids ?Have your blood tested for lipids and cholesterol at 32 years of age, then have this test every 5 years. ?You may need to have your cholesterol levels checked more often if: ?Your lipid or  cholesterol levels are high. ?You are older than 32 years of age. ?You are at high risk for heart disease. ?What should I know about cancer screening? ?Many types of cancers can be detected early and may often be prevented. Depending on your health history and family history, you may need to have cancer screening at various ages. This may include screening for: ?Colorectal cancer. ?Prostate cancer. ?Skin cancer. ?Lung cancer. ?What should I know about heart disease, diabetes, and high blood pressure? ?Blood pressure and heart disease ?High blood pressure causes heart disease and increases the risk of stroke. This is more likely to develop in people who have high blood pressure readings or are overweight. ?Talk with your health care provider about your target blood pressure readings. ?Have your blood pressure checked: ?Every 3-5 years if you are 17-54 years of age. ?Every year if you are 81 years old or older. ?If you are between the ages of 75 and 62 and are a current or former smoker, ask your health care provider if you should have a one-time screening for abdominal aortic aneurysm (AAA). ?Diabetes ?Have regular diabetes screenings. This checks your fasting blood sugar level. Have the screening done: ?Once every three years after age 75 if you are at a normal weight and have a low risk for diabetes. ?More often and at a younger age if you are overweight or have a high risk for diabetes. ?What should I know about preventing infection? ?Hepatitis B ?If you have a higher risk for hepatitis B, you should  be screened for this virus. Talk with your health care provider to find out if you are at risk for hepatitis B infection. ?Hepatitis C ?Blood testing is recommended for: ?Everyone born from [birth year] through 1966. ?Anyone with known risk factors for hepatitis C. ?Sexually transmitted infections (STIs) ?You should be screened each year for STIs, including gonorrhea and chlamydia, if: ?You are sexually active and are younger  than 32 years of age. ?You are older than 32 years of age and your health care provider tells you that you are at risk for this type of infection. ?Your sexual activity has changed since you were last screened, and you are at increased risk for chlamydia or gonorrhea. Ask your health care provider if you are at risk. ?Ask your health care provider about whether you are at high risk for HIV. Your health care provider may recommend a prescription medicine to help prevent HIV infection. If you choose to take medicine to prevent HIV, you should first get tested for HIV. You should then be tested every 3 months for as long as you are taking the medicine. ?Follow these instructions at home: ?Alcohol use ?Do not drink alcohol if your health care provider tells you not to drink. ?If you drink alcohol: ?Limit how much you have to 0-2 drinks a day. ?Know how much alcohol is in your drink. In the [place] one drink equals one 12 oz bottle of beer (355 mL), one 5 oz glass of wine (148 mL), or one 1? oz glass of hard liquor (44 mL). ?Lifestyle ?Do not use any products that contain nicotine or tobacco. These products include cigarettes, chewing tobacco, and vaping devices, such as e-cigarettes. If you need help quitting, ask your health care provider. ?Do not use street drugs. ?Do not share needles. ?Ask your health care provider for help if you need support or information about quitting drugs. ?General instructions ?Schedule regular health, dental, and eye exams. ?Stay current with your vaccines. ?Tell your health care provider if: ?You often feel depressed. ?You have ever been abused or do not feel safe at home. ?Summary ?Adopting a healthy lifestyle and getting preventive care are important in promoting health and wellness. ?Follow your health care provider's instructions about healthy diet, exercising, and getting tested or screened for diseases. ?Follow your health care provider's instructions on monitoring your cholesterol and  blood pressure. ?This information is not intended to replace advice given to you by your health care provider. Make sure you discuss any questions you have with your health care provider. ?Document Revised: 07/14/2020 Document Reviewed: 07/14/2020 ?Elsevier Patient Education ? [place]. ? ?

## 2021-05-06 NOTE — Progress Notes (Signed)
Leon Mason is a 31 y.o. male presents to office today for annual physical exam examination.    Concerns today include: 1. Allergies Needs Claritin or something similarly covered by insurance.  Gets allergic rhinitis this time of year.  2. Genital lesions Has genital lesions on his penis that have been present for several years. He reports having slept with someone several years ago that told him afterwards she was HPV positive.  He thinks they might be warts.  No pain, oozing. No dysuria, hematuria.  Not currently sexually active.  3.  Schizophrenia Patient is compliant with medications as prescribed.  He has totally avoided alcohol, marijuana.  He does vape and has essentially replaced most of his cigarette use with the vape.  He is working towards discontinue of vape at some point.  4.  Ankylosing spondylolysis He has not yet resumed Humira but plans to do so soon.  Apparently he had some issues with insurance and has been off of this medication for about a month.  He admits to ongoing joint pain.  His neck has been an issue despite use of Humira.  He wonders what he might do naturopathic lead to treat  Diet: Fair, Exercise: No structured reported Refills needed today: None Immunizations needed: Immunization History  Administered Date(s) Administered   DTaP 01/03/1990, 03/17/1990, 06/08/1990, 02/20/1991, 06/01/1994   Hepatitis B 01/05/2001, 02/01/2001, 05/04/2005   Hepatitis B, ped/adol 01/05/2001, 02/01/2001, 05/04/2005   HiB (PRP-OMP) 01/03/1990, 03/17/1990, 06/08/1990, 02/20/1991   IPV 01/03/1990, 03/17/1990, 02/20/1991, 06/01/1994   Influenza,inj,Quad PF,6+ Mos 02/20/2019, 02/16/2021   Influenza,inj,quad, With Preservative 03/18/2018   Influenza-Unspecified 03/18/2018   MMR 02/20/1991, 06/01/1994   Meningococcal Conjugate 05/04/2005   Pneumococcal Polysaccharide-23 08/29/2016, 02/20/2019   Tdap 05/04/2005     Past Medical History:  Diagnosis Date   Anxiety     Asthma    Bipolar 1 disorder ([place])    Hallucinations    Schizoaffective disorder (HCC)    Schizophrenia ([place])    Social History   Socioeconomic History   Marital status: Single    Spouse name: Not on file   Number of children: Not on file   Years of education: Not on file   Highest education level: Not on file  Occupational History   Not on file  Tobacco Use   Smoking status: Every Day    Years: 0.50    Types: Cigarettes    Last attempt to quit: 05/28/2018    Years since quitting: 2.9   Smokeless tobacco: Former   Tobacco comments:    1 cigarette daily   Vaping Use   Vaping Use: Former  Substance and Sexual Activity   Alcohol use: Not Currently   Drug use: Not Currently    Types: Marijuana   Sexual activity: Not Currently  Other Topics Concern   Not on file  Social History Narrative   Not on file   Social Determinants of Health   Financial Resource Strain: Not on file  Food Insecurity: Not on file  Transportation Needs: Not on file  Physical Activity: Not on file  Stress: Not on file  Social Connections: Not on file  Intimate Partner Violence: Not on file   Past Surgical History:  Procedure Laterality Date   WISDOM TOOTH EXTRACTION  2009   all four   Family History  Problem Relation Age of Onset   Bipolar disorder Mother    Alcoholism Mother    Diabetes Mother    Cervical cancer Mother  OCD Father    Bipolar disorder Father    Eczema Brother    Post-traumatic stress disorder Brother    COPD Maternal Aunt    Heart attack Maternal Grandmother    Breast cancer Maternal Grandmother    Heart attack Maternal Grandfather    Cancer Paternal Grandmother    Heart attack Paternal Grandfather    Asthma Neg Hx    Allergic rhinitis Neg Hx    Angioedema Neg Hx    Urticaria Neg Hx    Immunodeficiency Neg Hx    Atopy Neg Hx    Prostate cancer Neg Hx    Colon cancer Neg Hx     Current Outpatient Medications:    acetaminophen (TYLENOL) 500 MG tablet, Take  1 tablet (500 mg total) by mouth every 6 (six) hours as needed., Disp: 30 tablet, Rfl: 0   Adalimumab (HUMIRA PEN) 40 MG/0.4ML PNKT, Inject 40 mg into the skin every 14 (fourteen) days. 1 kit = 2 pens, Disp: 1 each, Rfl: 0   albuterol (PROVENTIL) (2.5 MG/3ML) 0.083% nebulizer solution, Inhale 3 mLs (2.5 mg total) into the lungs every 6 (six) hours as needed for wheezing or shortness of breath., Disp: 75 mL, Rfl: 1   albuterol (VENTOLIN HFA) 108 (90 Base) MCG/ACT inhaler, INHALE 1 TO 2 PUFFS BY MOUTH EVERY 6 HOURS AS NEEDED FOR WHEEZING FOR SHORTNESS OF BREATH, Disp: , Rfl:    diclofenac Sodium (VOLTAREN) 1 % GEL, Apply 2 g topically 4 (four) times daily., Disp: 100 g, Rfl: 1   fluticasone-salmeterol (ADVAIR) 250-50 MCG/ACT AEPB, Inhale 1 puff into the lungs in the morning and at bedtime. Rinse mouth after each use, Disp: 60 each, Rfl: 12   IBUPROFEN PO, Take by mouth as needed., Disp: , Rfl:    levalbuterol (XOPENEX HFA) 45 MCG/ACT inhaler, Inhale 2 puffs into the lungs every 6 (six) hours as needed for wheezing or shortness of breath., Disp: 1 each, Rfl: 1   Multiple Vitamin (MULTIVITAMIN PO), Take by mouth daily., Disp: , Rfl:    perphenazine (TRILAFON) 4 MG tablet, Take 1 tablet (4 mg total) by mouth 2 (two) times daily., Disp: 60 tablet, Rfl: 5   sertraline (ZOLOFT) 50 MG tablet, Take 1 tablet (50 mg total) by mouth daily., Disp: 30 tablet, Rfl: 5  Allergies  Allergen Reactions   Other Hives and Shortness Of Breath    Dogs and Cats.   Dust Mite Extract    Pollen Extract      ROS: Review of Systems Pertinent items noted in HPI and remainder of comprehensive ROS otherwise negative.    Physical exam BP 136/87    Pulse 80    Temp (!) 97.2 F (36.2 C)    Ht '6\' 2"'  (1.88 m)    Wt 276 lb 12.8 oz (125.6 kg)    SpO2 98%    BMI 35.54 kg/m  General appearance: alert, cooperative, appears stated age, and no distress Head: Normocephalic, without obvious abnormality, atraumatic Eyes: negative  findings: lids and lashes normal, conjunctivae and sclerae normal, corneas clear, and pupils equal, round, reactive to light and accomodation Ears: normal TM's and external ear canals both ears Nose: Nares normal. Septum midline. Mucosa normal. No drainage or sinus tenderness. Throat: lips, mucosa, and tongue normal; teeth and gums normal and mild oropharyngeal erythema.  No masses or lesions appreciated otherwise Neck: no adenopathy, supple, symmetrical, trachea midline, and thyroid not enlarged, symmetric, no tenderness/mass/nodules Back: symmetric, no curvature. ROM normal. No CVA tenderness. Lungs:  clear to auscultation bilaterally Chest wall: no tenderness Heart: regular rate and rhythm, S1, S2 normal, no murmur, click, rub or gallop Abdomen:  Protuberant Male genitalia:  He has 3 discrete lesions on the shaft of the penis.  They seem to be coliform like lesions and do have associated hyperpigmentation.  No oozing or ulcerations appreciated.  No penile discharge Extremities: extremities normal, atraumatic, no cyanosis or edema Pulses: 2+ and symmetric Skin:  As above Lymph nodes: Cervical, supraclavicular, and axillary nodes normal. Neurologic: Grossly normal Psych: Affect flat but patient very pleasant, interactive.  Does not appear to be responding to internal stimuli  Assessment/ Plan: Leon Mason here for annual physical exam.   Annual physical exam  Spondyloarthropathy  Genital warts - Plan: imiquimod (ALDARA) 5 % cream, CBC, RPR, HIV antibody (with reflex), HSV(herpes simplex vrs) 1+2 ab-IgG  Seasonal allergic rhinitis due to pollen - Plan: levocetirizine (XYZAL) 5 MG tablet  Paranoid schizophrenia ([place]) - Plan: CBC  Class 2 obesity due to excess calories with body mass index (BMI) of 35.0 to 35.9 in adult, unspecified whether serious comorbidity present - Plan: CMP14+EGFR, Lipid Panel, TSH, Bayer DCA Hb A1c Waived  Patient with known ankylosing spondylitis.  He will  continue Humira as prescribed by his rheumatologist.  We discussed that he may consider things like turmeric and even glucosamine with conjoint to help with some of the joint issues he has been experiencing  I have a high suspicion that lesions that we appreciate on exam today are consistent with genital warts.  I am going to empirically treat him with Aldara.  We discussed that if his symptoms do not improve with this treatment we may need to consider referral to urology for further evaluation and management.  In the interim we will screen for other infectious disease including RPR, HIV and HSV.  His eyes all prescribed for seasonal allergic rhinitis  Check CBC given medication use for ankylosing spondylosis and schizophrenia  A1c, lipid, TSH and CMP given obesity.  A1c did not demonstrate any evidence of diabetes or prediabetes.  Counseled on healthy lifestyle choices, including diet (rich in fruits, vegetables and lean meats and low in salt and simple carbohydrates) and exercise (at least 30 minutes of moderate physical activity daily).  Patient to follow up in 1 year for annual exam or sooner if needed.  Appolonia Ackert M. Lajuana Ripple, DO

## 2021-05-07 ENCOUNTER — Encounter: Payer: Self-pay | Admitting: Family Medicine

## 2021-05-07 LAB — CMP14+EGFR
ALT: 25 IU/L (ref 0–44)
AST: 16 IU/L (ref 0–40)
Albumin/Globulin Ratio: 1.9 (ref 1.2–2.2)
Albumin: 5 g/dL (ref 4.0–5.0)
Alkaline Phosphatase: 56 IU/L (ref 44–121)
BUN/Creatinine Ratio: 8 — ABNORMAL LOW (ref 9–20)
BUN: 7 mg/dL (ref 6–20)
Bilirubin Total: 0.5 mg/dL (ref 0.0–1.2)
CO2: 24 mmol/L (ref 20–29)
Calcium: 9.9 mg/dL (ref 8.7–10.2)
Chloride: 101 mmol/L (ref 96–106)
Creatinine, Ser: 0.9 mg/dL (ref 0.76–1.27)
Globulin, Total: 2.7 g/dL (ref 1.5–4.5)
Glucose: 89 mg/dL (ref 70–99)
Potassium: 4.2 mmol/L (ref 3.5–5.2)
Sodium: 138 mmol/L (ref 134–144)
Total Protein: 7.7 g/dL (ref 6.0–8.5)
eGFR: 117 mL/min/{1.73_m2} (ref >59)

## 2021-05-07 LAB — HIV ANTIBODY (ROUTINE TESTING W REFLEX): HIV Screen 4th Generation wRfx: NONREACTIVE

## 2021-05-07 LAB — CBC
Hematocrit: 48.1 % (ref 37.5–51.0)
Hemoglobin: 16.4 g/dL (ref 13.0–17.7)
MCH: 30.7 pg (ref 26.6–33.0)
MCHC: 34.1 g/dL (ref 31.5–35.7)
MCV: 90 fL (ref 79–97)
Platelets: 187 10*3/uL (ref 150–450)
RBC: 5.35 x10E6/uL (ref 4.14–5.80)
RDW: 12.8 % (ref 11.6–15.4)
WBC: 5.7 10*3/uL (ref 3.4–10.8)

## 2021-05-07 LAB — LIPID PANEL
Chol/HDL Ratio: 6.9 ratio — ABNORMAL HIGH (ref 0.0–5.0)
Cholesterol, Total: 248 mg/dL — ABNORMAL HIGH (ref 100–199)
HDL: 36 mg/dL — ABNORMAL LOW (ref >39)
LDL Chol Calc (NIH): 173 mg/dL — ABNORMAL HIGH (ref 0–99)
Triglycerides: 205 mg/dL — ABNORMAL HIGH (ref 0–149)
VLDL Cholesterol Cal: 39 mg/dL (ref 5–40)

## 2021-05-07 LAB — TSH: TSH: 0.971 u[IU]/mL (ref 0.450–4.500)

## 2021-05-07 LAB — HSV(HERPES SIMPLEX VRS) I + II AB-IGG
HSV 1 Glycoprotein G Ab, IgG: <0.91 index (ref 0.00–0.90)
HSV 2 IgG, Type Spec: <0.91 index (ref 0.00–0.90)

## 2021-05-07 LAB — RPR: RPR Ser Ql: NONREACTIVE

## 2021-05-08 ENCOUNTER — Telehealth: Payer: Self-pay | Admitting: Adult Health

## 2021-05-08 ENCOUNTER — Ambulatory Visit (INDEPENDENT_AMBULATORY_CARE_PROVIDER_SITE_OTHER): Payer: 59 | Admitting: Physician Assistant

## 2021-05-08 ENCOUNTER — Encounter: Payer: Self-pay | Admitting: Physician Assistant

## 2021-05-08 ENCOUNTER — Other Ambulatory Visit: Payer: Self-pay

## 2021-05-08 ENCOUNTER — Ambulatory Visit (INDEPENDENT_AMBULATORY_CARE_PROVIDER_SITE_OTHER): Payer: 59 | Admitting: Internal Medicine

## 2021-05-08 ENCOUNTER — Encounter: Payer: Self-pay | Admitting: Internal Medicine

## 2021-05-08 VITALS — BP 123/79 | HR 76 | Ht 75.0 in | Wt 277.0 lb

## 2021-05-08 VITALS — BP 126/68 | HR 57 | Ht 75.0 in | Wt 280.0 lb

## 2021-05-08 DIAGNOSIS — R002 Palpitations: Secondary | ICD-10-CM

## 2021-05-08 DIAGNOSIS — G8929 Other chronic pain: Secondary | ICD-10-CM

## 2021-05-08 DIAGNOSIS — R9431 Abnormal electrocardiogram [ECG] [EKG]: Secondary | ICD-10-CM

## 2021-05-08 DIAGNOSIS — M533 Sacrococcygeal disorders, not elsewhere classified: Secondary | ICD-10-CM

## 2021-05-08 DIAGNOSIS — M546 Pain in thoracic spine: Secondary | ICD-10-CM

## 2021-05-08 DIAGNOSIS — F250 Schizoaffective disorder, bipolar type: Secondary | ICD-10-CM

## 2021-05-08 DIAGNOSIS — R6510 Systemic inflammatory response syndrome (SIRS) of non-infectious origin without acute organ dysfunction: Secondary | ICD-10-CM

## 2021-05-08 DIAGNOSIS — M47819 Spondylosis without myelopathy or radiculopathy, site unspecified: Secondary | ICD-10-CM

## 2021-05-08 DIAGNOSIS — Z79899 Other long term (current) drug therapy: Secondary | ICD-10-CM

## 2021-05-08 DIAGNOSIS — R7401 Elevation of levels of liver transaminase levels: Secondary | ICD-10-CM

## 2021-05-08 DIAGNOSIS — L500 Allergic urticaria: Secondary | ICD-10-CM

## 2021-05-08 DIAGNOSIS — M25562 Pain in left knee: Secondary | ICD-10-CM

## 2021-05-08 DIAGNOSIS — H209 Unspecified iridocyclitis: Secondary | ICD-10-CM

## 2021-05-08 DIAGNOSIS — F17200 Nicotine dependence, unspecified, uncomplicated: Secondary | ICD-10-CM

## 2021-05-08 DIAGNOSIS — M542 Cervicalgia: Secondary | ICD-10-CM

## 2021-05-08 DIAGNOSIS — R5383 Other fatigue: Secondary | ICD-10-CM

## 2021-05-08 DIAGNOSIS — J4530 Mild persistent asthma, uncomplicated: Secondary | ICD-10-CM

## 2021-05-08 DIAGNOSIS — F1020 Alcohol dependence, uncomplicated: Secondary | ICD-10-CM

## 2021-05-08 DIAGNOSIS — Z1589 Genetic susceptibility to other disease: Secondary | ICD-10-CM

## 2021-05-08 MED ORDER — PREDNISONE 5 MG PO TABS: ORAL_TABLET | ORAL | 0 refills | Status: DC

## 2021-05-08 NOTE — Telephone Encounter (Signed)
Pt called and said that he has had the ekg done today and everything is good. He wanted gina to know ?

## 2021-05-08 NOTE — Progress Notes (Signed)
?Cardiology Office Note:   ? ?Date:  05/08/2021  ? ?ID:  Leon Mason, DOB October 05, 1990, MRN [id number] ? ?PCP:  Leon Norlander, DO ?  ?[facility] Providers ?Cardiologist:  Leon Mayo, MD    ? ?Referring MD: Leon Merino, MD  ? ?No chief complaint on file. ?Palpitations ? ?History of Present Illness:   ? ?Leon Mason is a 31 y.o. 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 ?He notes fluttering in his chest.  Feels like blood rushing. No syncope. He denies LH, dizziness. He drinks  40 oz of coffee per day. ? ? ?QT was noted to be prolonged on EKG however 399 ms. Sinus rhythm, sinus arrhythmia. EKG 02/22/2019- SVT 120s, quality is not great.  ? ?Family Hx: No heart disease hx in parents. No premature CAD.  ? ?Social Hx: He smokes cigarettes. He does vaping. Used to do MJ. Former heavy etoh.  ? ? ?Past Medical History:  ?Diagnosis Date  ? Anxiety   ? Asthma   ? Bipolar 1 disorder ([place])   ? Hallucinations   ? Schizoaffective disorder ([place])   ? Schizophrenia ([place])   ? ? ?Past Surgical History:  ?Procedure Laterality Date  ? [place] EXTRACTION  2009  ? all four  ? ? ?Current Medications: ?Current Outpatient Medications on File Prior to Visit  ?Medication Sig Dispense Refill  ? acetaminophen (TYLENOL) 500 MG tablet Take 1 tablet (500 mg total) by mouth every 6 (six) hours as needed. 30 tablet 0  ? Adalimumab (HUMIRA PEN) 40 MG/0.4ML PNKT Inject 40 mg into the skin every 14 (fourteen) days. 1 kit = 2 pens 1 each 0  ? albuterol (PROVENTIL) (2.5 MG/3ML) 0.083% nebulizer solution Inhale 3 mLs (2.5 mg total) into the lungs every 6 (six) hours as needed for wheezing or shortness of breath. 75 mL 1  ? albuterol (VENTOLIN HFA) 108 (90 Base) MCG/ACT inhaler INHALE 1 TO 2 PUFFS BY MOUTH EVERY 6 HOURS AS NEEDED FOR WHEEZING FOR SHORTNESS OF BREATH    ? diclofenac Sodium (VOLTAREN) 1 % GEL Apply 2 g topically 4 (four) times daily. 100 g 1  ?  fluticasone-salmeterol (ADVAIR) 250-50 MCG/ACT AEPB Inhale 1 puff into the lungs in the morning and at bedtime. Rinse mouth after each use 60 each 12  ? IBUPROFEN PO Take by mouth as needed.    ? imiquimod (ALDARA) 5 % cream Apply a thin layer 3 times per week (on alternate days) prior to bedtime; leave on skin for 6 to 10 hours, then remove with mild soap and water.  Max 16 weeks 12 each 3  ? levalbuterol (XOPENEX HFA) 45 MCG/ACT inhaler Inhale 2 puffs into the lungs every 6 (six) hours as needed for wheezing or shortness of breath. 1 each 1  ? levocetirizine (XYZAL) 5 MG tablet Take 1 tablet (5 mg total) by mouth every evening. For allergies. Use instead of Claritin 90 tablet 3  ? Multiple Vitamin (MULTIVITAMIN PO) Take by mouth daily.    ? perphenazine (TRILAFON) 4 MG tablet Take 1 tablet (4 mg total) by mouth 2 (two) times daily. 60 tablet 5  ? predniSONE (DELTASONE) 5 MG tablet Take 4 tablets by mouth daily x4 days, 3 tablets daily x4 days, 2 tablets daily x4 days, 1 tablet daily x4 days. 40 tablet 0  ? sertraline (ZOLOFT) 50 MG tablet Take 1 tablet (50 mg total) by mouth daily. 30 tablet 5  ? [  DISCONTINUED] mometasone-formoterol (DULERA) 100-5 MCG/ACT AERO Inhale 2 puffs into the lungs 2 (two) times daily. For shortness of breath (Patient not taking: Reported on 02/17/2019) 1 Inhaler 3  ? ?No current facility-administered medications on file prior to visit.  ? ? ?Allergies:   Other, Dust mite extract, and Pollen extract  ? ?Social History  ? ?Socioeconomic History  ? Marital status: Single  ?  Spouse name: Not on file  ? Number of children: Not on file  ? Years of education: Not on file  ? Highest education level: Not on file  ?Occupational History  ? Not on file  ?Tobacco Use  ? Smoking status: Every Day  ?  Years: 0.50  ?  Types: Cigarettes  ?  Last attempt to quit: 05/28/2018  ?  Years since quitting: 2.9  ? Smokeless tobacco: Former  ? Tobacco comments:  ?  1 cigarette daily   ?Vaping Use  ? Vaping Use:  Every day  ?Substance and Sexual Activity  ? Alcohol use: Not Currently  ? Drug use: Not Currently  ?  Types: Marijuana  ? Sexual activity: Not Currently  ?Other Topics Concern  ? Not on file  ?Social History Narrative  ? Not on file  ? ?Social Determinants of Health  ? ?Financial Resource Strain: Not on file  ?Food Insecurity: Not on file  ?Transportation Needs: Not on file  ?Physical Activity: Not on file  ?Stress: Not on file  ?Social Connections: Not on file  ?  ? ?Family History: ?The patient's family history includes Alcoholism in his mother; Bipolar disorder in his father and mother; Breast cancer in his maternal grandmother; COPD in his maternal aunt; Cancer in his paternal grandmother; Cervical cancer in his mother; Diabetes in his mother; Eczema in his brother; Heart attack in his maternal grandfather, maternal grandmother, and paternal grandfather; OCD in his father; Post-traumatic stress disorder in his brother. There is no history of Asthma, Allergic rhinitis, Angioedema, Urticaria, Immunodeficiency, Atopy, Prostate cancer, or Colon cancer. ? ?ROS:   ?Please see the history of present illness.    ? All other systems reviewed and are negative. ? ?EKGs/Labs/Other Studies Reviewed:   ? ?The following studies were reviewed today: ? ? ?EKG:  EKG is  ordered today.  The ekg ordered today demonstrates  ? ?Normal sinus rhythm, sinus arrhythmia  ? ?Recent Labs: ?05/06/2021: ALT 25; BUN 7; Creatinine, Ser 0.90; Hemoglobin 16.4; Platelets 187; Potassium 4.2; Sodium 138; TSH 0.971  ?Recent Lipid Panel ?   ?Component Value Date/Time  ? CHOL 248 (H) 05/06/2021 1357  ? TRIG 205 (H) 05/06/2021 1357  ? HDL 36 (L) 05/06/2021 1357  ? CHOLHDL 6.9 (H) 05/06/2021 1357  ? CHOLHDL 6.3 03/30/2018 0636  ? VLDL 39 03/30/2018 0636  ? LDLCALC 173 (H) 05/06/2021 1357  ? ? ? ?Risk Assessment/Calculations:   ?  ? ?    ? ?Physical Exam:   ? ?VS:   ? ?Vitals:  ? 05/08/21 1355  ?BP: 126/68  ?Pulse: (!) 57  ?SpO2: 98%  ? ? ? ? ?Wt Readings  from Last 3 Encounters:  ?05/08/21 280 lb (127 kg)  ?05/08/21 277 lb (125.6 kg)  ?05/06/21 276 lb 12.8 oz (125.6 kg)  ?  ? ?GEN:  Well nourished, well developed in no acute distress ?HEENT: Normal ?NECK: No JVD; No carotid bruits ?LYMPHATICS: No lymphadenopathy ?CARDIAC: RRR, no murmurs, rubs, gallops ?RESPIRATORY:  Clear to auscultation without rales, wheezing or rhonchi  ?ABDOMEN: non-distended ?MUSCULOSKELETAL:  No edema;  No deformity  ?SKIN: Warm and dry ?NEUROLOGIC:  Alert and oriented x 3 ?PSYCHIATRIC:  Normal affect  ? ?ASSESSMENT:   ? ?#Palpitations:He does not have high risk features including syncope c/f arrhythmia , family hx of SCD, or abnormalities on her EKG. His Qtc is normal. Suspect his palpitations are benign. We discussed cutting back on caffeine. If high risk features were to arise, recommend he can follow up; otherwise he has no indication to follow up with cardiology at this time ? ? ?PLAN:   ? ?In order of problems listed above: ? ?Follow up PRN ? ?   ? ?   ?Medication Adjustments/Labs and Tests Ordered: ?Current medicines are reviewed at length with the patient today.  Concerns regarding medicines are outlined above.  ?No orders of the defined types were placed in this encounter. ? ?No orders of the defined types were placed in this encounter. ? ? ?Patient Instructions  ?Medication Instructions:  ?No Changes In Medications at this time.  ?*If you need a refill on your cardiac medications before your next appointment, please call your pharmacy* ? ?Follow-Up: ?At [facility], you and your health needs are our priority.  As part of our continuing mission to provide you with exceptional heart care, we have created designated Provider Care Teams.  These Care Teams include your primary Cardiologist (physician) and Advanced Practice Providers (APPs -  Physician Assistants and Nurse Practitioners) who all work together to provide you with the care you need, when you need it. ? ?Your next  appointment:   ?AS NEEDED  ? ?The format for your next appointment:   ?In Person ? ?Provider:   ?Leon Mayo, MD \   ? ?Signed, ?Leon Mayo, MD  ?05/08/2021 2:12 PM    ?[place] ?

## 2021-05-08 NOTE — Telephone Encounter (Signed)
FYI

## 2021-05-08 NOTE — Telephone Encounter (Signed)
Noted  

## 2021-05-08 NOTE — Patient Instructions (Signed)

## 2021-05-11 ENCOUNTER — Ambulatory Visit: Payer: Medicaid Other | Admitting: Internal Medicine

## 2021-05-20 ENCOUNTER — Other Ambulatory Visit: Payer: Self-pay | Admitting: Rheumatology

## 2021-05-20 DIAGNOSIS — Z79899 Other long term (current) drug therapy: Secondary | ICD-10-CM

## 2021-05-20 DIAGNOSIS — H209 Unspecified iridocyclitis: Secondary | ICD-10-CM

## 2021-05-20 DIAGNOSIS — M47819 Spondylosis without myelopathy or radiculopathy, site unspecified: Secondary | ICD-10-CM

## 2021-05-20 NOTE — Telephone Encounter (Signed)
Next Visit: 08/13/2021 ? ?Last Visit: 05/08/2021 ? ?Last Fill: 04/30/2021 (30 day supply) ? ?[id number] ? ?Current Dose per office note 05/08/2021: Humira 40 mg sq injections every 14 days ? ?Labs: 05/06/2021 BUN/Creat. Ratio 8 ? ?TB Gold: 12/04/2020 Neg   ? ?Okay to refill Humira?  ?

## 2021-05-29 ENCOUNTER — Encounter: Payer: Self-pay | Admitting: Family Medicine

## 2021-05-29 ENCOUNTER — Ambulatory Visit (INDEPENDENT_AMBULATORY_CARE_PROVIDER_SITE_OTHER): Payer: 59 | Admitting: Family Medicine

## 2021-05-29 VITALS — BP 120/73 | HR 61 | Temp 97.6°F | Ht 75.0 in | Wt 274.2 lb

## 2021-05-29 DIAGNOSIS — A630 Anogenital (venereal) warts: Secondary | ICD-10-CM

## 2021-05-29 NOTE — Patient Instructions (Addendum)
Cryoablation, Care After ?This sheet gives you information about how to care for yourself after your procedure. Your health care provider may also give you more specific instructions. If you have problems or questions, contact your health care provider. ?What can I expect after the procedure? ?After the procedure, it is common to have: ?Soreness around the treatment area. ?Mild pain and swelling in the treatment area. ?Follow these instructions at home: ?Treatment area care ? ?If you have an incision, follow instructions from your health care provider about how to take care of it. Make sure you: ?Wash your hands with soap and water for at least 20 seconds before and after you change your bandage (dressing). If soap and water are not available, use hand sanitizer. ?Change your dressing as told by your health care provider. ?Leave stitches (sutures), skin glue, or adhesive strips in place. These skin closures may need to stay in place for 2 weeks or longer. If adhesive strip edges start to loosen and curl up, you may trim the loose edges. Do not remove adhesive strips completely unless your health care provider tells you to do that. ?Check your treatment area every day for signs of infection. Check for: ?More redness, swelling, or pain. ?Fluid or blood. ?Warmth. ?Pus or a bad smell. ?Keep the treated area clean, dry, and covered with a dressing until it has healed. Clean the area with soap and water or as told by your health care provider. If your bandage gets wet, change it right away. ?Do not take baths, swim, or use a hot tub until your health care provider approves. Ask your health care provider if you may take showers. You may only be allowed to take sponge baths. ?Activity ? ?Follow instructions from your health care provider about any activity limitations, including lifting heavy objects. ?If you were given a sedative during the procedure, it can affect you for several hours. Do not drive or operate machinery  until your health care provider says that it is safe. ?General instructions ?Take over-the-counter and prescription medicines only as told by your health care provider. ?Do not use any products that contain nicotine or tobacco, such as cigarettes, e-cigarettes, and chewing tobacco. These can delay incision healing. If you need help quitting, ask your health care provider. ?Keep all follow-up visits as told by your health care provider. This is important. ?Contact a health care provider if: ?You have increased pain. ?You have a fever. ?You have nausea or vomiting. ?You have any of these signs of infection: ?More redness, swelling, or pain around your treatment area. ?Fluid or blood coming from your treatment area. ?Warmth coming from your treatment area. ?Pus or a bad smell coming from your treatment area. ?You do not have a bowel movement for 2 days. ?Get help right away if you have: ?Severe pain. ?Trouble swallowing or breathing. ?Severe weakness or dizziness. ?Chest pain or shortness of breath. ?These symptoms may represent a serious problem that is an emergency. Do not wait to see if the symptoms will go away. Get medical help right away. Call your local emergency services (911 in the [place] Do not drive yourself to the hospital. ?Summary ?After the procedure, it is common for the treatment area to be sore, mildly painful, and swollen. ?If you have a dressing, change it as told by your health care provider. ?Follow instructions from your health care provider about any activity limitations. ?Contact a health care provider if you have increased pain or a fever. ?This information is   not intended to replace advice given to you by your health care provider. Make sure you discuss any questions you have with your health care provider. ?Document Revised: 11/30/2018 Document Reviewed: 11/30/2018 ?Elsevier Patient Education ? 2022 [employer]vier Inc[employer] ? ?

## 2021-05-29 NOTE — Progress Notes (Signed)
? ?Subjective: ?CC: Penile lesions ?PCP: Leon Norlander, DO ?[id number] Leon Mason is Leon 31 y.o. male presenting to clinic today for: ? ?1.  Penile lesions ?Patient was seen roughly 3 weeks ago for penile lesions that were concerning for possible genital warts.  He was placed on Aldara but notes he really has not noticed Leon difference with this treatment.  Of note he was prescribed Leon 16-week regimen.  He was proceed with cryotherapy of these lesions ? ? ?ROS: Per HPI ? ?Allergies  ?Allergen Reactions  ? Other Hives and Shortness Of Breath  ?  Dogs and Cats.  ? Dust Mite Extract   ? Pollen Extract   ? ?Past Medical History:  ?Diagnosis Date  ? Anxiety   ? Asthma   ? Bipolar 1 disorder ([place])   ? Hallucinations   ? Schizoaffective disorder ([place])   ? Schizophrenia ([place])   ? ? ?Current Outpatient Medications:  ?  acetaminophen (TYLENOL) 500 MG tablet, Take 1 tablet (500 mg total) by mouth every 6 (six) hours as needed., Disp: 30 tablet, Rfl: 0 ?  albuterol (PROVENTIL) (2.5 MG/3ML) 0.083% nebulizer solution, Inhale 3 mLs (2.5 mg total) into the lungs every 6 (six) hours as needed for wheezing or shortness of breath., Disp: 75 mL, Rfl: 1 ?  albuterol (VENTOLIN HFA) 108 (90 Base) MCG/ACT inhaler, INHALE 1 TO 2 PUFFS BY MOUTH EVERY 6 HOURS AS NEEDED FOR WHEEZING FOR SHORTNESS OF BREATH, Disp: , Rfl:  ?  diclofenac Sodium (VOLTAREN) 1 % GEL, Apply 2 g topically 4 (four) times daily., Disp: 100 g, Rfl: 1 ?  fluticasone-salmeterol (ADVAIR) 250-50 MCG/ACT AEPB, Inhale 1 puff into the lungs in the morning and at bedtime. Rinse mouth after each use, Disp: 60 each, Rfl: 12 ?  HUMIRA PEN 40 MG/0.4ML PNKT, INJECT 1 PEN SUBCUTANEOUSLY EVERY 14 DAYS, Disp: 6 each, Rfl: 0 ?  IBUPROFEN PO, Take by mouth as needed., Disp: , Rfl:  ?  imiquimod (ALDARA) 5 % cream, Apply Leon thin layer 3 times per week (on alternate days) prior to bedtime; leave on skin for 6 to 10 hours, then remove with mild soap and water.  Max 16 weeks, Disp: 12 each,  Rfl: 3 ?  levalbuterol (XOPENEX HFA) 45 MCG/ACT inhaler, Inhale 2 puffs into the lungs every 6 (six) hours as needed for wheezing or shortness of breath., Disp: 1 each, Rfl: 1 ?  levocetirizine (XYZAL) 5 MG tablet, Take 1 tablet (5 mg total) by mouth every evening. For allergies. Use instead of Claritin, Disp: 90 tablet, Rfl: 3 ?  Multiple Vitamin (MULTIVITAMIN PO), Take by mouth daily., Disp: , Rfl:  ?  perphenazine (TRILAFON) 4 MG tablet, Take 1 tablet (4 mg total) by mouth 2 (two) times daily., Disp: 60 tablet, Rfl: 5 ?  sertraline (ZOLOFT) 50 MG tablet, Take 1 tablet (50 mg total) by mouth daily., Disp: 30 tablet, Rfl: 5 ?Social History  ? ?Socioeconomic History  ? Marital status: Single  ?  Spouse name: Not on file  ? Number of children: Not on file  ? Years of education: Not on file  ? Highest education level: Not on file  ?Occupational History  ? Not on file  ?Tobacco Use  ? Smoking status: Every Day  ?  Years: 0.50  ?  Types: Cigarettes  ?  Last attempt to quit: 05/28/2018  ?  Years since quitting: 3.0  ? Smokeless tobacco: Former  ? Tobacco comments:  ?  1 cigarette daily   ?  Vaping Use  ? Vaping Use: Every day  ?Substance and Sexual Activity  ? Alcohol use: Not Currently  ? Drug use: Not Currently  ?  Types: Marijuana  ? Sexual activity: Not Currently  ?Other Topics Concern  ? Not on file  ?Social History Narrative  ? Not on file  ? ?Social Determinants of Health  ? ?Financial Resource Strain: Not on file  ?Food Insecurity: Not on file  ?Transportation Needs: Not on file  ?Physical Activity: Not on file  ?Stress: Not on file  ?Social Connections: Not on file  ?Intimate Partner Violence: Not on file  ? ?Family History  ?Problem Relation Age of Onset  ? Bipolar disorder Mother   ? Alcoholism Mother   ? Diabetes Mother   ? Cervical cancer Mother   ? OCD Father   ? Bipolar disorder Father   ? Eczema Brother   ? Post-traumatic stress disorder Brother   ? COPD Maternal Aunt   ? Heart attack Maternal Grandmother    ? Breast cancer Maternal Grandmother   ? Heart attack Maternal Grandfather   ? Cancer Paternal Grandmother   ? Heart attack Paternal Grandfather   ? Asthma Neg Hx   ? Allergic rhinitis Neg Hx   ? Angioedema Neg Hx   ? Urticaria Neg Hx   ? Immunodeficiency Neg Hx   ? Atopy Neg Hx   ? Prostate cancer Neg Hx   ? Colon cancer Neg Hx   ? ? ?Objective: ?Office vital signs reviewed. ?BP 120/73   Pulse 61   Temp 97.6 ?F (36.4 ?C)   Ht '6\' 3"'$  (1.905 m)   Wt 274 lb 3.2 oz (124.4 kg)   SpO2 95%   BMI 34.27 kg/m?  ? ?Physical Examination:  ?General: Awake, alert, No acute distress ?GU: 3 penile lesions appreciated, 2 along the ventral aspect of the shaft of the penis and one along the dorsum.  These are coliform in appearance and do not have any vesicular or exudative processes ? ?Cryotherapy Procedure: ? ?Risks and benefits of procedure were reviewed with the patient.  Written consent obtained and scanned into the chart.   ? ?Lesion #1 was identified and located on ventral aspect of penile shaft.  Liquid nitrogen was applied to area of concern and extending out 1 millimeters beyond the border of the lesion.  Treated area was allowed to come back to room temperature before treating it Leon second time.   ? ?Lesion #2 was identified and located just superior to lesion #1 on the ventral aspect of the shaft of the penis.  Liquid nitrogen was applied to the area of concern and extended out 1 mm beyond the border of the lesion.  This was allowed to come back to room temperature before treating Leon second time ? ?Lesion #3 was identified and located on the dorsal aspect of the shaft of the penis.  Liquid nitrogen was applied to the area of concern and extended out 1 mm beyond the border of the lesion.  It was allowed to come back to room temperature before treating Leon second time ? ?Patient tolerated procedure well and there were no immediate complications.  Home care instructions were reviewed with the patient and Leon handout was  provided. ? ?Assessment/ Plan: ?31 y.o. male  ? ?Genital Lesions ? ?3 genital lesions still suspicious for genital warts were treated with cryo of the lesion today.  Home care instructions reviewed with the patient.  Reasons for return discussed.  Follow-up  as needed ? ? ?No orders of the defined types were placed in this encounter. ? ?No orders of the defined types were placed in this encounter. ? ? ? ?Leon Norlander, DO ?[place] ?[phone] ? ?

## 2021-06-15 ENCOUNTER — Encounter: Payer: Self-pay | Admitting: Nurse Practitioner

## 2021-06-15 ENCOUNTER — Ambulatory Visit (INDEPENDENT_AMBULATORY_CARE_PROVIDER_SITE_OTHER): Payer: 59 | Admitting: Nurse Practitioner

## 2021-06-15 VITALS — BP 122/77 | HR 58 | Temp 97.1°F | Ht 75.0 in | Wt 272.0 lb

## 2021-06-15 DIAGNOSIS — J029 Acute pharyngitis, unspecified: Secondary | ICD-10-CM

## 2021-06-15 LAB — RAPID STREP SCREEN (MED CTR MEBANE ONLY): Strep Gp A Ag, IA W/Reflex: NEGATIVE

## 2021-06-15 LAB — CULTURE, GROUP A STREP

## 2021-06-15 MED ORDER — AMOXICILLIN-POT CLAVULANATE 875-125 MG PO TABS: 1.0000 | ORAL_TABLET | Freq: Two times a day (BID) | ORAL | 0 refills | Status: DC

## 2021-06-15 NOTE — Progress Notes (Signed)
? ?Acute Office Visit ? ?Subjective:  ? ? Patient ID: Leon Mason, male    DOB: 11/17/1990, 31 y.o.   MRN: [id number] ? ?Chief Complaint  ?Patient presents with  ? throat pain  ?  Started 4 days ago  ? ? ?Sore Throat  ?This is a new problem. Episode onset: The past 4 days. The problem has been gradually worsening. Neither side of throat is experiencing more pain than the other. There has been no fever. The pain is at a severity of 6/10. The pain is moderate. Associated symptoms include swollen glands and trouble swallowing. Pertinent negatives include no congestion, coughing, diarrhea, drooling, ear discharge, ear pain, headaches or neck pain. He has had no exposure to strep. He has tried nothing for the symptoms.  ? ? ?Past Medical History:  ?Diagnosis Date  ? Anxiety   ? Asthma   ? Bipolar 1 disorder ([place])   ? Hallucinations   ? Schizoaffective disorder ([place])   ? Schizophrenia ([place])   ? ? ?Past Surgical History:  ?Procedure Laterality Date  ? [place] EXTRACTION  2009  ? all four  ? ? ?Family History  ?Problem Relation Age of Onset  ? Bipolar disorder Mother   ? Alcoholism Mother   ? Diabetes Mother   ? Cervical cancer Mother   ? OCD Father   ? Bipolar disorder Father   ? Eczema Brother   ? Post-traumatic stress disorder Brother   ? COPD Maternal Aunt   ? Heart attack Maternal Grandmother   ? Breast cancer Maternal Grandmother   ? Heart attack Maternal Grandfather   ? Cancer Paternal Grandmother   ? Heart attack Paternal Grandfather   ? Asthma Neg Hx   ? Allergic rhinitis Neg Hx   ? Angioedema Neg Hx   ? Urticaria Neg Hx   ? Immunodeficiency Neg Hx   ? Atopy Neg Hx   ? Prostate cancer Neg Hx   ? Colon cancer Neg Hx   ? ? ?Social History  ? ?Socioeconomic History  ? Marital status: Single  ?  Spouse name: Not on file  ? Number of children: Not on file  ? Years of education: Not on file  ? Highest education level: Not on file  ?Occupational History  ? Not on file  ?Tobacco Use  ? Smoking status: Every Day  ?   Years: 0.50  ?  Types: Cigarettes  ?  Last attempt to quit: 05/28/2018  ?  Years since quitting: 3.0  ? Smokeless tobacco: Former  ? Tobacco comments:  ?  1 cigarette daily   ?Vaping Use  ? Vaping Use: Every day  ?Substance and Sexual Activity  ? Alcohol use: Not Currently  ? Drug use: Not Currently  ?  Types: Marijuana  ? Sexual activity: Not Currently  ?Other Topics Concern  ? Not on file  ?Social History Narrative  ? Not on file  ? ?Social Determinants of Health  ? ?Financial Resource Strain: Not on file  ?Food Insecurity: Not on file  ?Transportation Needs: Not on file  ?Physical Activity: Not on file  ?Stress: Not on file  ?Social Connections: Not on file  ?Intimate Partner Violence: Not on file  ? ? ?Outpatient Medications Prior to Visit  ?Medication Sig Dispense Refill  ? acetaminophen (TYLENOL) 500 MG tablet Take 1 tablet (500 mg total) by mouth every 6 (six) hours as needed. 30 tablet 0  ? albuterol (PROVENTIL) (2.5 MG/3ML) 0.083% nebulizer solution Inhale 3 mLs (2.5  mg total) into the lungs every 6 (six) hours as needed for wheezing or shortness of breath. 75 mL 1  ? albuterol (VENTOLIN HFA) 108 (90 Base) MCG/ACT inhaler INHALE 1 TO 2 PUFFS BY MOUTH EVERY 6 HOURS AS NEEDED FOR WHEEZING FOR SHORTNESS OF BREATH    ? diclofenac Sodium (VOLTAREN) 1 % GEL Apply 2 g topically 4 (four) times daily. 100 g 1  ? fluticasone-salmeterol (ADVAIR) 250-50 MCG/ACT AEPB Inhale 1 puff into the lungs in the morning and at bedtime. Rinse mouth after each use 60 each 12  ? HUMIRA PEN 40 MG/0.4ML PNKT INJECT 1 PEN SUBCUTANEOUSLY EVERY 14 DAYS 6 each 0  ? IBUPROFEN PO Take by mouth as needed.    ? imiquimod (ALDARA) 5 % cream Apply a thin layer 3 times per week (on alternate days) prior to bedtime; leave on skin for 6 to 10 hours, then remove with mild soap and water.  Max 16 weeks 12 each 3  ? levalbuterol (XOPENEX HFA) 45 MCG/ACT inhaler Inhale 2 puffs into the lungs every 6 (six) hours as needed for wheezing or shortness of  breath. 1 each 1  ? levocetirizine (XYZAL) 5 MG tablet Take 1 tablet (5 mg total) by mouth every evening. For allergies. Use instead of Claritin 90 tablet 3  ? Multiple Vitamin (MULTIVITAMIN PO) Take by mouth daily.    ? perphenazine (TRILAFON) 4 MG tablet Take 1 tablet (4 mg total) by mouth 2 (two) times daily. 60 tablet 5  ? sertraline (ZOLOFT) 50 MG tablet Take 1 tablet (50 mg total) by mouth daily. 30 tablet 5  ? ?No facility-administered medications prior to visit.  ? ? ?Allergies  ?Allergen Reactions  ? Other Hives and Shortness Of Breath  ?  Dogs and Cats.  ? Dust Mite Extract   ? Pollen Extract   ? ? ?Review of Systems  ?Constitutional: Negative.   ?HENT:  Positive for trouble swallowing. Negative for congestion, drooling, ear discharge and ear pain.   ?Respiratory:  Negative for cough.   ?Gastrointestinal: Negative.  Negative for diarrhea.  ?Musculoskeletal:  Negative for neck pain.  ?Neurological:  Negative for headaches.  ?All other systems reviewed and are negative. ? ?   ?Objective:  ?  ?Physical Exam ?Vitals and nursing note reviewed.  ?Constitutional:   ?   Appearance: He is obese.  ?HENT:  ?   Head: Normocephalic.  ?   Right Ear: External ear normal.  ?   Left Ear: External ear normal.  ?   Nose: Nose normal. No congestion.  ?   Mouth/Throat:  ?   Lips: Pink. No lesions.  ?   Mouth: Mucous membranes are moist. No oral lesions.  ?   Pharynx: Pharyngeal swelling and posterior oropharyngeal erythema present.  ?Eyes:  ?   Conjunctiva/sclera: Conjunctivae normal.  ?Cardiovascular:  ?   Rate and Rhythm: Normal rate and regular rhythm.  ?   Pulses: Normal pulses.  ?   Heart sounds: Normal heart sounds.  ?Pulmonary:  ?   Effort: Pulmonary effort is normal.  ?   Breath sounds: Normal breath sounds.  ?Abdominal:  ?   General: Bowel sounds are normal.  ?Skin: ?   General: Skin is warm.  ?   Findings: No rash.  ?Neurological:  ?   General: No focal deficit present.  ?   Mental Status: He is alert and oriented to  person, place, and time.  ?Psychiatric:     ?   Behavior:  Behavior normal.  ? ? ?BP 122/77   Pulse (!) 58   Temp (!) 97.1 ?F (36.2 ?C)   Ht '6\' 3"'  (1.905 m)   Wt 272 lb (123.4 kg)   SpO2 94%   BMI 34.00 kg/m?  ?Wt Readings from Last 3 Encounters:  ?06/15/21 272 lb (123.4 kg)  ?05/29/21 274 lb 3.2 oz (124.4 kg)  ?05/08/21 280 lb (127 kg)  ? ? ?Health Maintenance Due  ?Topic Date Due  ? COVID-19 Vaccine (1) Never done  ? ? ?There are no preventive care reminders to display for this patient. ? ? ?Lab Results  ?Component Value Date  ? TSH 0.971 05/06/2021  ? ?Lab Results  ?Component Value Date  ? WBC 5.7 05/06/2021  ? HGB 16.4 05/06/2021  ? HCT 48.1 05/06/2021  ? MCV 90 05/06/2021  ? PLT 187 05/06/2021  ? ?Lab Results  ?Component Value Date  ? NA 138 05/06/2021  ? K 4.2 05/06/2021  ? CO2 24 05/06/2021  ? GLUCOSE 89 05/06/2021  ? BUN 7 05/06/2021  ? CREATININE 0.90 05/06/2021  ? BILITOT 0.5 05/06/2021  ? ALKPHOS 56 05/06/2021  ? AST 16 05/06/2021  ? ALT 25 05/06/2021  ? PROT 7.7 05/06/2021  ? ALBUMIN 5.0 05/06/2021  ? CALCIUM 9.9 05/06/2021  ? ANIONGAP 11 02/19/2019  ? EGFR 117 05/06/2021  ? ?Lab Results  ?Component Value Date  ? CHOL 248 (H) 05/06/2021  ? ?Lab Results  ?Component Value Date  ? HDL 36 (L) 05/06/2021  ? ?Lab Results  ?Component Value Date  ? LDLCALC 173 (H) 05/06/2021  ? ?Lab Results  ?Component Value Date  ? TRIG 205 (H) 05/06/2021  ? ?Lab Results  ?Component Value Date  ? CHOLHDL 6.9 (H) 05/06/2021  ? ?Lab Results  ?Component Value Date  ? HGBA1C 4.9 05/06/2021  ? ? ?   ?Assessment & Plan:  ?Take meds as prescribed ?- Use a cool mist humidifier  ?-Salt water gargles as needed ?-Chloraseptic spray ?-For fever or aches or pains- take Tylenol or ibuprofen. ?-Strep swab completed results pending. ?-If symptoms do not improve, she may need to be COVID tested to rule this out ?Follow up with worsening unresolved symptoms  ?Problem List Items Addressed This Visit   ?None ?Visit Diagnoses   ? ?  Pharyngitis, unspecified etiology    -  Primary  ? Relevant Medications  ? amoxicillin-clavulanate (AUGMENTIN) 875-125 MG tablet  ? Sorethroat      ? Relevant Orders  ? Rapid Strep Screen ([facility] ONLY)  ? ?  ?

## 2021-06-15 NOTE — Patient Instructions (Signed)
Pharyngitis ?Pharyngitis is a sore throat (pharynx). This is when there is redness, pain, and swelling in your throat. Most of the time, this condition gets better on its own. In some cases, you may need medicine. ?What are the causes? ?An infection from a virus. ?An infection from bacteria. ?Allergies. ?What increases the risk? ?Being 5-32 years old. ?Being in crowded environments. These include: ?Daycares. ?Schools. ?Dormitories. ?Living in a place with cold temperatures outside. ?Having a weakened disease-fighting (immune) system. ?What are the signs or symptoms? ?Symptoms may vary depending on the cause. Common symptoms include: ?Sore throat. ?Tiredness (fatigue). ?Low-grade fever. ?Stuffy nose. ?Cough. ?Headache. ?Other symptoms may include: ?Glands in the neck (lymph nodes) that are swollen. ?Skin rashes. ?Film on the throat or tonsils. This can be caused by an infection from bacteria. ?Vomiting. ?Red, itchy eyes. ?Loss of appetite. ?Joint pain and muscle aches. ?Tonsils that are temporarily bigger than usual (enlarged). ?How is this treated? ?Many times, treatment is not needed. This condition usually gets better in 3-4 days without treatment. ?If the infection is caused by a bacteria, you may be need to take antibiotics. ?Follow these instructions at home: ?Medicines ?Take over-the-counter and prescription medicines only as told by your doctor. ?If you were prescribed an antibiotic medicine, take it as told by your doctor. Do not stop taking the antibiotic even if you start to feel better. ?Use throat lozenges or sprays to soothe your throat as told by your doctor. ?Children can get pharyngitis. Do not give your child aspirin. ?Managing pain ?To help with pain, try: ?Sipping warm liquids, such as: ?Broth. ?Herbal tea. ?Warm water. ?Eating or drinking cold or frozen liquids, such as frozen ice pops. ?Rinsing your mouth (gargle) with a salt water mixture 3-4 times a day or as needed. ?To make salt water,  dissolve ?-1 tsp (3-6 g) of salt in 1 cup (237 mL) of warm water. ?Do not swallow this mixture. ?Sucking on hard candy or throat lozenges. ?Putting a cool-mist humidifier in your bedroom at night to moisten the air. ?Sitting in the bathroom with the door closed for 5-10 minutes while you run hot water in the shower. ? ?General instructions ? ?Do not smoke or use any products that contain nicotine or tobacco. If you need help quitting, ask your doctor. ?Rest as told by your doctor. ?Drink enough fluid to keep your pee (urine) pale yellow. ?How is this prevented? ?Wash your hands often for at least 20 seconds with soap and water. If soap and water are not available, use hand sanitizer. ?Do not touch your eyes, nose, or mouth with unwashed hands. Wash hands after touching these areas. ?Do not share cups or eating utensils. ?Avoid close contact with people who are sick. ?Contact a doctor if: ?You have large, tender lumps in your neck. ?You have a rash. ?You cough up green, yellow-brown, or bloody spit. ?Get help right away if: ?You have a stiff neck. ?You drool or cannot swallow liquids. ?You cannot drink or take medicines without vomiting. ?You have very bad pain that does not go away with medicine. ?You have problems breathing, and it is not from a stuffy nose. ?You have new pain and swelling in your knees, ankles, wrists, or elbows. ?These symptoms may be an emergency. Get help right away. Call your local emergency services (911 in the [place] ?Do not wait to see if the symptoms will go away. ?Do not drive yourself to the hospital. ?Summary ?Pharyngitis is a sore throat (pharynx). This is   when there is redness, pain, and swelling in your throat. ?Most of the time, pharyngitis gets better on its own. Sometimes, you may need medicine. ?If you were prescribed an antibiotic medicine, take it as told by your doctor. Do not stop taking the antibiotic even if you start to feel better. ?This information is not intended to  replace advice given to you by your health care provider. Make sure you discuss any questions you have with your health care provider. ?Document Revised: 05/21/2020 Document Reviewed: 05/21/2020 ?Elsevier Patient Education ? 2022 Elsevier Inc. ? ?

## 2021-06-23 ENCOUNTER — Telehealth: Payer: Self-pay | Admitting: Rheumatology

## 2021-06-23 NOTE — Telephone Encounter (Signed)
Patient returned call to the office. Patient advised to hold his Humira until after the infection has completely resolved. Patient advised to contact PCP for evaluation since he is still sick. Patient expressed understanding.  ?

## 2021-06-23 NOTE — Telephone Encounter (Signed)
Attempted to contact the patient and left message for patient to call the office.  

## 2021-06-23 NOTE — Telephone Encounter (Signed)
Patient called the office stating he recently had laryngitis and was on antibiotics. Patient states he is still sick and has not taken Humira. Patient states he is 4 days past when he is supposed to take it. Patient states he would like to know when he can resume the Humira. ?

## 2021-07-24 ENCOUNTER — Ambulatory Visit (INDEPENDENT_AMBULATORY_CARE_PROVIDER_SITE_OTHER): Payer: 59 | Admitting: Psychiatry

## 2021-07-24 DIAGNOSIS — F203 Undifferentiated schizophrenia: Secondary | ICD-10-CM | POA: Diagnosis not present

## 2021-07-24 DIAGNOSIS — M47819 Spondylosis without myelopathy or radiculopathy, site unspecified: Secondary | ICD-10-CM

## 2021-07-24 DIAGNOSIS — F331 Major depressive disorder, recurrent, moderate: Secondary | ICD-10-CM

## 2021-07-24 DIAGNOSIS — F1291 Cannabis use, unspecified, in remission: Secondary | ICD-10-CM

## 2021-07-24 DIAGNOSIS — F1091 Alcohol use, unspecified, in remission: Secondary | ICD-10-CM

## 2021-07-24 DIAGNOSIS — F411 Generalized anxiety disorder: Secondary | ICD-10-CM | POA: Diagnosis not present
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
## 2021-07-29 ENCOUNTER — Ambulatory Visit (INDEPENDENT_AMBULATORY_CARE_PROVIDER_SITE_OTHER): Payer: 59 | Admitting: Family Medicine

## 2021-07-29 ENCOUNTER — Encounter: Payer: Self-pay | Admitting: Family Medicine

## 2021-07-29 ENCOUNTER — Telehealth: Payer: Self-pay | Admitting: Rheumatology

## 2021-07-29 DIAGNOSIS — M459 Ankylosing spondylitis of unspecified sites in spine: Secondary | ICD-10-CM

## 2021-07-29 DIAGNOSIS — G5622 Lesion of ulnar nerve, left upper limb: Secondary | ICD-10-CM | POA: Diagnosis not present

## 2021-07-29 DIAGNOSIS — G5620 Lesion of ulnar nerve, unspecified upper limb: Secondary | ICD-10-CM

## 2021-07-29 MED ORDER — PREDNISONE 20 MG PO TABS: ORAL_TABLET | ORAL | 0 refills | Status: DC

## 2021-07-29 NOTE — Telephone Encounter (Signed)
Patient called the office requesting to make an appointment which is scheduled for 08/04/21. Patient states his left arm is numb and started 5 days ago. Patient states it goes away every once in a while but always comes back. Patient already contacted PCP and they think its a pinched nerve. Patient states they recommended neurology. Patient states he would rather come here.

## 2021-07-29 NOTE — Telephone Encounter (Signed)
Patient advised Dr. Estanislado Pandy reviewed PCPs office visit note from today.  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  A prednisone taper was provided to the patient.   Patient advised Lovena Le spoke with Dr. Estanislado Pandy and she recommended a referral to orthopedics for further evaluation. Patient agreed to referral. Referral placed.

## 2021-07-29 NOTE — Progress Notes (Signed)
Virtual Visit via telephone Note  I connected with Leon Mason on 07/29/21 at 1620 by telephone and verified that I am speaking with the correct person using two identifiers. Leon Mason is currently located at home and patient are currently with her during visit. The provider, Fransisca Kaufmann Wylee Dorantes, MD is located in their office at time of visit.  Call ended at 1629  I discussed the limitations, risks, security and privacy concerns of performing an evaluation and management service by telephone and the availability of in person appointments. I also discussed with the patient that there may be a patient responsible charge related to this service. The patient expressed understanding and agreed to proceed.   History and Present Illness: Patient is calling in for left arm is numb for the past 5 days.  It goes on the pinky side up his arm to his axilla. He has tried advil and tylenol for his neck pain but saw no benefit. He wakes up with the numbness and it is better with movement and stretching. He has ankylosis spondylitis. He has rheumatologist. He denies rash or fevers chills or body aches.     Outpatient Encounter Medications as of 07/29/2021  Medication Sig   predniSONE (DELTASONE) 20 MG tablet Take 3 tabs daily for 1 week, then 2 tabs daily for week 2, then 1 tab daily for week 3.   acetaminophen (TYLENOL) 500 MG tablet Take 1 tablet (500 mg total) by mouth every 6 (six) hours as needed.   albuterol (PROVENTIL) (2.5 MG/3ML) 0.083% nebulizer solution Inhale 3 mLs (2.5 mg total) into the lungs every 6 (six) hours as needed for wheezing or shortness of breath.   albuterol (VENTOLIN HFA) 108 (90 Base) MCG/ACT inhaler INHALE 1 TO 2 PUFFS BY MOUTH EVERY 6 HOURS AS NEEDED FOR WHEEZING FOR SHORTNESS OF BREATH   amoxicillin-clavulanate (AUGMENTIN) 875-125 MG tablet Take 1 tablet by mouth 2 (two) times daily.   diclofenac Sodium (VOLTAREN) 1 % GEL Apply 2 g topically 4 (four) times daily.    fluticasone-salmeterol (ADVAIR) 250-50 MCG/ACT AEPB Inhale 1 puff into the lungs in the morning and at bedtime. Rinse mouth after each use   HUMIRA PEN 40 MG/0.4ML PNKT INJECT 1 PEN SUBCUTANEOUSLY EVERY 14 DAYS   IBUPROFEN PO Take by mouth as needed.   imiquimod (ALDARA) 5 % cream Apply a thin layer 3 times per week (on alternate days) prior to bedtime; leave on skin for 6 to 10 hours, then remove with mild soap and water.  Max 16 weeks   levalbuterol (XOPENEX HFA) 45 MCG/ACT inhaler Inhale 2 puffs into the lungs every 6 (six) hours as needed for wheezing or shortness of breath.   levocetirizine (XYZAL) 5 MG tablet Take 1 tablet (5 mg total) by mouth every evening. For allergies. Use instead of Claritin   Multiple Vitamin (MULTIVITAMIN PO) Take by mouth daily.   perphenazine (TRILAFON) 4 MG tablet Take 1 tablet (4 mg total) by mouth 2 (two) times daily.   sertraline (ZOLOFT) 50 MG tablet Take 1 tablet (50 mg total) by mouth daily.   [DISCONTINUED] mometasone-formoterol (DULERA) 100-5 MCG/ACT AERO Inhale 2 puffs into the lungs 2 (two) times daily. For shortness of breath (Patient not taking: Reported on 02/17/2019)   No facility-administered encounter medications on file as of 07/29/2021.    Review of Systems  Constitutional:  Negative for chills and fever.  Respiratory:  Negative for shortness of breath and wheezing.   Cardiovascular:  Negative for  chest pain and leg swelling.  Musculoskeletal:  Positive for arthralgias. Negative for back pain and gait problem.  Skin:  Negative for rash.  Neurological:  Positive for numbness. Negative for weakness.  All other systems reviewed and are negative.  Observations/Objective: Patient sounds comfortable and in no acute distress  Assessment and Plan: Problem List Items Addressed This Visit   None Visit Diagnoses     Ulnar nerve impingement, left    -  Primary   Relevant Medications   predniSONE (DELTASONE) 20 MG tablet   Ankylosing  spondylitis, unspecified site of spine (HCC)       Relevant Medications   predniSONE (DELTASONE) 20 MG tablet       Patient has a history of ankylosing spondylitis with recurrent syndromes like this where he gets nerve impingement or visual deficits or some neurological deficit, will do short course of tapering prednisone and then if he does not improve to go discuss it more with his rheumatologist.  Also possibly consider seeing a neurologist in the future. Follow up plan: Return if symptoms worsen or fail to improve.     I discussed the assessment and treatment plan with the patient. The patient was provided an opportunity to ask questions and all were answered. The patient agreed with the plan and demonstrated an understanding of the instructions.   The patient was advised to call back or seek an in-person evaluation if the symptoms worsen or if the condition fails to improve as anticipated.  The above assessment and management plan was discussed with the patient. The patient verbalized understanding of and has agreed to the management plan. Patient is aware to call the clinic if symptoms persist or worsen. Patient is aware when to return to the clinic for a follow-up visit. Patient educated on when it is appropriate to go to the emergency department.    I provided 9 minutes of non-face-to-face time during this encounter.    Worthy Rancher, MD

## 2021-07-29 NOTE — Telephone Encounter (Signed)
Reviewed PCPs office visit note from today.  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  A prednisone taper was provided to the patient.   I spoke with Dr. Estanislado Pandy and she recommended a referral to orthopedics for further evaluation.

## 2021-07-30 NOTE — Progress Notes (Signed)
Office Visit Note  Patient: Leon Mason             Date of Birth: 1990/12/17           MRN: [id number]             PCP: Chevis Pretty, FNP Referring: Chevis Pretty, * Visit Date: 08/04/2021 Occupation: _0 @  Subjective:  Left arm numbness   History of Present Illness: Leon Mason is a 31 y.o. male with history of spondyloarthropathy and iritis.  Patient remains on Humira 40 mg subcutaneous injections every 14 days.  He continues to tolerate Humira without any side effects and has not missed any doses recently.  He states about 2 months ago he had an infection and held 1 dose of Humira but did not have any signs or symptoms of a flare during that time.  He has not had any iritis flares recently.  He continues to have discomfort and stiffness in his neck, thoracic, and lower back but has been manageable overall. He presents today with numbness in his left arm which started 1 week ago.  He has not had an injury prior to the onset of symptoms.  He recently got a new pillow and typically sleeps with his left arm tucked beneath his head and neck which she feels may have brought on the numbness he is experiencing. He states he wakes up first thing in the morning and the left arm is completely numb and typically improves as the day goes on.  He denies any weakness in the left hand.  He denies any nocturnal symptoms.  He was evaluated by his PCP on 07/29/2021 and was diagnosed with ulnar nerve entrapment.  He was given a prescription for prednisone but he has not taken that yet due to the concern for masking his symptoms.  He has an upcoming appointment scheduled tomorrow with Dr. Domenica Fail for further evaluation.   Activities of Daily Living:  Patient reports morning stiffness for 0 minutes.   Patient Denies nocturnal pain.  Difficulty dressing/grooming: Denies Difficulty climbing stairs: Denies Difficulty getting out of chair: Denies Difficulty using hands for taps,  buttons, cutlery, and/or writing: Reports  Review of Systems  Constitutional:  Negative for fatigue.  HENT:  Negative for mouth sores, mouth dryness and nose dryness.   Eyes:  Negative for pain, itching and dryness.  Respiratory:  Negative for shortness of breath and difficulty breathing.   Cardiovascular:  Negative for chest pain and palpitations.  Gastrointestinal:  Negative for blood in stool, constipation and diarrhea.  Endocrine: Negative for increased urination.  Genitourinary:  Negative for difficulty urinating.  Musculoskeletal:  Positive for myalgias and myalgias. Negative for joint pain, joint pain, joint swelling, morning stiffness and muscle tenderness.  Skin:  Negative for color change, rash and redness.  Allergic/Immunologic: Negative for susceptible to infections.  Neurological:  Positive for numbness and parasthesias. Negative for dizziness, headaches, memory loss and weakness.  Hematological:  Negative for bruising/bleeding tendency.  Psychiatric/Behavioral:  Negative for confusion.    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   Back pain without sciatica 02/16/2021   Neck pain 02/16/2021   Generalized joint pain 10/21/2020   Generalized abdominal pain 10/21/2020   Mild intermittent asthma with (acute) exacerbation 02/18/2019   Hypokalemia 02/18/2019   Normocytic anemia 02/18/2019   Elevated transaminase level 02/18/2019   Prolonged QT interval 02/18/2019   Viral syndrome 02/18/2019   SIRS (systemic  inflammatory response syndrome) ([place]) 02/18/2019   Globus sensation 07/04/2018   Schizophrenia ([place]) 03/28/2018   MDD (major depressive disorder), recurrent episode, severe ([place]) 03/28/2018   Schizo-affective schizophrenia, chronic condition with acute exacerbation ([place]) 03/28/2018   Tobacco use disorder 12/17/2016   Schizoaffective disorder, bipolar type ([place]) 08/23/2016   Alcohol use disorder, severe,  dependence ([place]) 08/23/2016   Allergic rhinitis due to pollen 10/09/2015   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)    Hallucinations    Schizoaffective disorder ([place])    Schizophrenia ([place])     Family History  Problem Relation Age of Onset   Bipolar disorder Mother    Alcoholism Mother    Diabetes Mother    Cervical cancer Mother    OCD Father    Bipolar disorder Father    Eczema Brother    Post-traumatic stress disorder Brother    COPD Maternal Aunt    Heart attack Maternal Grandmother    Breast cancer Maternal Grandmother    Heart attack Maternal Grandfather    Cancer Paternal Grandmother    Heart attack Paternal Grandfather    Asthma Neg Hx    Allergic rhinitis Neg Hx    Angioedema Neg Hx    Urticaria Neg Hx    Immunodeficiency Neg Hx    Atopy Neg Hx    Prostate cancer Neg Hx    Colon cancer Neg Hx    Past Surgical History:  Procedure Laterality Date   WISDOM TOOTH EXTRACTION  2009   all four   Social History   Social History Narrative   Not on file   Immunization History  Administered Date(s) Administered   DTaP 01/03/1990, 03/17/1990, 06/08/1990, 02/20/1991, 06/01/1994   Hepatitis B 01/05/2001, 02/01/2001, 05/04/2005   Hepatitis B, ped/adol 01/05/2001, 02/01/2001, 05/04/2005   HiB (PRP-OMP) 01/03/1990, 03/17/1990, 06/08/1990, 02/20/1991   IPV 01/03/1990, 03/17/1990, 02/20/1991, 06/01/1994   Influenza,inj,Quad PF,6+ Mos 02/20/2019, 02/16/2021   Influenza,inj,quad, With Preservative 03/18/2018   Influenza-Unspecified 03/18/2018   MMR 02/20/1991, 06/01/1994   Meningococcal Conjugate 05/04/2005   Pneumococcal Polysaccharide-23 08/29/2016, 02/20/2019   Tdap 05/04/2005     Objective: Vital Signs: BP 119/76 (BP Location: Right Arm, Patient Position: Sitting, Cuff Size: Large)   Pulse 91   Ht 6' 3"  (1.905 m)   Wt 275 lb 12.8 oz (125.1 kg)   BMI 34.47 kg/m    Physical Exam Vitals and nursing note reviewed.  Constitutional:      Appearance: He is well-developed.  HENT:     Head: Normocephalic and atraumatic.  Eyes:     Conjunctiva/sclera: Conjunctivae normal.     Pupils: Pupils are equal, round, and reactive to light.  Cardiovascular:     Rate and Rhythm: Normal rate and regular rhythm.     Heart sounds: Normal heart sounds.  Pulmonary:     Effort: Pulmonary effort is normal.     Breath sounds: Normal breath sounds.  Abdominal:     General: Bowel sounds are normal.     Palpations: Abdomen is soft.  Musculoskeletal:     Cervical back: Normal range of motion and neck supple.  Skin:    General: Skin is warm and dry.     Capillary Refill: Capillary refill takes less than 2 seconds.  Neurological:     Mental Status: He is alert and  oriented to person, place, and time.  Psychiatric:        Behavior: Behavior normal.     Musculoskeletal Exam: C-spine, thoracic spine, and lumbar spine have limited range of motion.  Discomfort with lateral rotation of the C-spine as well as with extension.  Thoracic kyphosis was noted.  No midline spinal tenderness or SI joint tenderness upon palpation.  Shoulder joints have good range of motion with no discomfort.  Elbow joints have good range of motion with no tenderness along the joint line.  Some tenderness on the medial aspect of the left elbow.  Wrist joints have good range of motion with no tenderness or synovitis.  Mild flexion contracture of the left fourth PIP joint noted.  Hip joints have good range of motion with no groin pain.  Knee joints have good range of motion with no warmth or effusion.  Ankle joints have good range of motion with no joint tenderness.  No evidence of Achilles tendinitis or plantar fasciitis noted.  CDAI Exam: CDAI Score: -- Patient Global: --; Provider Global: -- Swollen: --; Tender: -- Joint Exam 08/04/2021    No joint exam has been documented for this visit   There is currently no information documented on the homunculus. Go to the Rheumatology activity and complete the homunculus joint exam.  Investigation: No additional findings.  Imaging: No results found.  Recent Labs: Lab Results  Component Value Date   WBC 5.7 05/06/2021   HGB 16.4 05/06/2021   PLT 187 05/06/2021   NA 138 05/06/2021   K 4.2 05/06/2021   CL 101 05/06/2021   CO2 24 05/06/2021   GLUCOSE 89 05/06/2021   BUN 7 05/06/2021   CREATININE 0.90 05/06/2021   BILITOT 0.5 05/06/2021   ALKPHOS 56 05/06/2021   AST 16 05/06/2021   ALT 25 05/06/2021   PROT 7.7 05/06/2021   ALBUMIN 5.0 05/06/2021   CALCIUM 9.9 05/06/2021   GFRAA >60 02/19/2019   QFTBGOLDPLUS NEGATIVE 12/04/2020    Speciality Comments: Humira started December 26, 2020  Procedures:  No procedures performed Allergies: Other, Dust mite extract, and Pollen extract   Assessment / Plan:     Visit Diagnoses: Spondyloarthropathy - Inflammatory arthritis, iritis, chronic SI joint pain, chronic pain in neck, thoracic and lumbar region.  HLA-B27 positive: He is not exhibiting any signs or symptoms of a flare currently.  He has no midline spinal tenderness or SI joint tenderness at this time.  He has not been experiencing any nocturnal pain or increased morning stiffness.  He continues to experience some discomfort and stiffness in his neck and lower back.  Overall he has noticed significant improvement in his symptoms on Humira.  He has been injecting marrow 40 mg subcutaneously every 14 days.  He has not had any recurrence of an iritis flare.  He is not experiencing any synovitis or dactylitis at this time.  He has no evidence of Achilles tendinitis or plantar fasciitis.  Referral to physical therapy was placed today to try to improve his neck, thoracic, lumbar pain and stiffness. He will remain on Humira as prescribed.  He was advised to notify us if he develops  signs or symptoms of a flare.  He will follow-up in the office in 3 months or sooner if needed. - Plan: Ambulatory referral to Physical Therapy  High risk medication use - Humira 40 mg sq injections every 14 days-initiated Humira on 12/26/20.  An adequate response and GI intolerance to NSAIDs in the past.  CBC and CMP updated on 05/06/2021.  He is due to update lab work today.  Orders for CBC and CMP were released.  His next lab work will be due in September and every 3 months to monitor for drug toxicity.  TB gold negative on 12/04/2020.  Future order for TB Gold placed today.- Plan: COMPLETE METABOLIC PANEL WITH GFR, CBC with Differential/Platelet, QuantiFERON-TB Gold Plus, Ambulatory referral to Dermatology He had an infection about 2 months ago which time he had Humira for 1 dose.  He resumed Humira injections once his symptoms had completely resolved as advised.  Discussed importance of always holding Humira if he develops signs or symptoms of infection and to resume once the infection has completely cleared. I also discussed the importance of yearly skin examinations while on Humira due to the increased risk for nonmelanoma skin cancers.  A referral to dermatology was placed today.  Screening for tuberculosis -Future order for TB Gold placed today.  Plan: QuantiFERON-TB Gold Plus  Skin cancer screening -A referral to dermatology was placed today for yearly skin cancer screenings.  Plan: Ambulatory referral to Dermatology  HLA B27 positive  Entrapment of left ulnar nerve: Patient presents today with numbness along the ulnar distribution of his left arm which started 1 week ago.  He did not have any injury prior to the onset of symptoms.  He recently got a new pillow and has been sleeping with his left arm folded underneath his head and neck which she feels may have exacerbated his symptoms.  The numbness in his left arm is most severe first thing in the morning.  He has not had any weakness in his  left hand.  He had a virtual visit with his PCP on 07/29/2021 and the office visit note was reviewed today in the office.  The patient was diagnosed with ulnar nerve entrapment he was prescribed a prednisone taper.  He has not picked up the prednisone taper from the pharmacy yet due to not wanting to mask his symptoms prior to his evaluation with Dr. Marlou Sa on 08/06/2021. On examination no muscle atrophy was noted.  No tenderness or inflammation along the elbow joint line was noted.  No symptoms consistent with olecranon bursitis.  Positive tinel's sign at the elbow noted.His symptoms are consistent with ulnar nerve entrapment at the elbow.   Different treatment options were discussed today.  He has a follow-up visit scheduled with Dr. Marlou Sa on 08/06/2021 for further evaluation and management. Referral for physical therapy to improve his neck, thoracic, and lumbar range of motion and discomfort was placed today.  He may benefit from additional physical therapy or occupational therapy for his left elbow in the future.  He plans on further discussing with Dr. Marlou Sa. He was advised to notify us if his symptoms persist or worsen.   Iritis: He has not had any signs or symptoms of a flare since starting humira.  No conjunctival injection was noted today.  He will remain on humira as prescribed.   Chronic pain of left knee: He has good ROM with no warmth or effusion. No recurrence of an effusion since starting humira.   Neck stiffness - He continues to have persistent pain and stiffness in his neck.  He has noticed ongoing crepitus as well. No symptoms of radiculopathy.  A referral to PT was placed today.  Plan: Ambulatory referral to Physical Therapy  Pain in thoracic spine - No midline spinal tenderness.  A referral to PT was placed today.  Plan: Ambulatory referral to Physical Therapy  Chronic SI joint pain - Referral to PT was placed today. Plan: Ambulatory referral to Physical Therapy  Elevated transaminase  level: LFTs within normal limits on 05/06/2021.  CMP updated today.   Other fatigue: Stable.   Other medical conditions are listed as follows:   Prolonged QT interval  Allergic urticaria  Schizoaffective disorder, bipolar type (HCC)  SIRS (systemic inflammatory response syndrome) (HCC)  Mild persistent allergic asthma  Palpitations  Smoker  Alcohol use disorder, severe, dependence ([place])  Orders: Orders Placed This Encounter  Procedures   COMPLETE METABOLIC PANEL WITH GFR   CBC with Differential/Platelet   QuantiFERON-TB Gold Plus   Ambulatory referral to Dermatology   Ambulatory referral to Physical Therapy   No orders of the defined types were placed in this encounter.   Follow-Up Instructions: Return in about 3 months (around 11/04/2021) for Spondyloarthropathy.   Ofilia Neas, PA-C  Note - This record has been created using Dragon software.  Chart creation errors have been sought, but may not always  have been located. Such creation errors do not reflect on  the standard of medical care.

## 2021-08-04 ENCOUNTER — Ambulatory Visit (INDEPENDENT_AMBULATORY_CARE_PROVIDER_SITE_OTHER): Payer: 59 | Admitting: Physician Assistant

## 2021-08-04 ENCOUNTER — Encounter: Payer: Self-pay | Admitting: Physician Assistant

## 2021-08-04 VITALS — BP 119/76 | HR 91 | Ht 75.0 in | Wt 275.8 lb

## 2021-08-04 DIAGNOSIS — H209 Unspecified iridocyclitis: Secondary | ICD-10-CM

## 2021-08-04 DIAGNOSIS — M25562 Pain in left knee: Secondary | ICD-10-CM

## 2021-08-04 DIAGNOSIS — F250 Schizoaffective disorder, bipolar type: Secondary | ICD-10-CM

## 2021-08-04 DIAGNOSIS — Z79899 Other long term (current) drug therapy: Secondary | ICD-10-CM

## 2021-08-04 DIAGNOSIS — F17200 Nicotine dependence, unspecified, uncomplicated: Secondary | ICD-10-CM

## 2021-08-04 DIAGNOSIS — R9431 Abnormal electrocardiogram [ECG] [EKG]: Secondary | ICD-10-CM

## 2021-08-04 DIAGNOSIS — M47819 Spondylosis without myelopathy or radiculopathy, site unspecified: Secondary | ICD-10-CM | POA: Diagnosis not present

## 2021-08-04 DIAGNOSIS — Z1283 Encounter for screening for malignant neoplasm of skin: Secondary | ICD-10-CM

## 2021-08-04 DIAGNOSIS — L500 Allergic urticaria: Secondary | ICD-10-CM

## 2021-08-04 DIAGNOSIS — J4530 Mild persistent asthma, uncomplicated: Secondary | ICD-10-CM

## 2021-08-04 DIAGNOSIS — Z111 Encounter for screening for respiratory tuberculosis: Secondary | ICD-10-CM

## 2021-08-04 DIAGNOSIS — F1020 Alcohol dependence, uncomplicated: Secondary | ICD-10-CM

## 2021-08-04 DIAGNOSIS — R5383 Other fatigue: Secondary | ICD-10-CM

## 2021-08-04 DIAGNOSIS — Z1589 Genetic susceptibility to other disease: Secondary | ICD-10-CM

## 2021-08-04 DIAGNOSIS — M533 Sacrococcygeal disorders, not elsewhere classified: Secondary | ICD-10-CM

## 2021-08-04 DIAGNOSIS — R6510 Systemic inflammatory response syndrome (SIRS) of non-infectious origin without acute organ dysfunction: Secondary | ICD-10-CM

## 2021-08-04 DIAGNOSIS — M436 Torticollis: Secondary | ICD-10-CM

## 2021-08-04 DIAGNOSIS — R002 Palpitations: Secondary | ICD-10-CM

## 2021-08-04 DIAGNOSIS — G8929 Other chronic pain: Secondary | ICD-10-CM

## 2021-08-04 DIAGNOSIS — M546 Pain in thoracic spine: Secondary | ICD-10-CM

## 2021-08-04 DIAGNOSIS — G5622 Lesion of ulnar nerve, left upper limb: Secondary | ICD-10-CM

## 2021-08-04 DIAGNOSIS — R7401 Elevation of levels of liver transaminase levels: Secondary | ICD-10-CM

## 2021-08-04 NOTE — Patient Instructions (Signed)
Standing Labs We placed an order today for your standing lab work.   Please have your standing labs drawn in September and every 3 months   If possible, please have your labs drawn 2 weeks prior to your appointment so that the provider can discuss your results at your appointment.  Please note that you may see your imaging and lab results in MyChart before we have reviewed them. We may be awaiting multiple results to interpret others before contacting you. Please allow our office up to 72 hours to thoroughly review all of the results before contacting the office for clarification of your results.  We have open lab daily: Monday through Thursday from 1:30-4:30 PM and Friday from 1:30-4:00 PM at the office of Dr. Shaili Deveshwar, [facility].   Please be advised, all patients with office appointments requiring lab work will take precedent over walk-in lab work.  If possible, please come for your lab work on Monday and Friday afternoons, as you may experience shorter wait times. The office is located at [address], [address], [place], [place] [address] No appointment is necessary.   Labs are drawn by [facility]. Please bring your co-pay at the time of your lab draw.  You may receive a bill from [facility] for your lab work.  Please note if you are on Hydroxychloroquine and and an order has been placed for a Hydroxychloroquine level, you will need to have it drawn 4 hours or more after your last dose.  If you wish to have your labs drawn at another location, please call the office 24 hours in advance to send orders.  If you have any questions regarding directions or hours of operation,  please call [phone].   As a reminder, please drink plenty of water prior to coming for your lab work. Thanks!  

## 2021-08-05 ENCOUNTER — Ambulatory Visit: Payer: 59 | Admitting: Family Medicine

## 2021-08-05 LAB — COMPLETE METABOLIC PANEL WITH GFR
AG Ratio: 1.8 (calc) (ref 1.0–2.5)
ALT: 21 U/L (ref 9–46)
AST: 16 U/L (ref 10–40)
Albumin: 4.7 g/dL (ref 3.6–5.1)
Alkaline phosphatase (APISO): 53 U/L (ref 36–130)
BUN: 15 mg/dL (ref 7–25)
CO2: 25 mmol/L (ref 20–32)
Calcium: 9.8 mg/dL (ref 8.6–10.3)
Chloride: 103 mmol/L (ref 98–110)
Creat: 0.92 mg/dL (ref 0.60–1.26)
Globulin: 2.6 g/dL (calc) (ref 1.9–3.7)
Glucose, Bld: 86 mg/dL (ref 65–99)
Potassium: 3.9 mmol/L (ref 3.5–5.3)
Sodium: 139 mmol/L (ref 135–146)
Total Bilirubin: 0.6 mg/dL (ref 0.2–1.2)
Total Protein: 7.3 g/dL (ref 6.1–8.1)
eGFR: 114 mL/min/{1.73_m2} (ref >=60)

## 2021-08-05 LAB — CBC WITH DIFFERENTIAL/PLATELET
Absolute Monocytes: 493 cells/uL (ref 200–950)
Basophils Absolute: 83 cells/uL (ref 0–200)
Basophils Relative: 1.3 %
Eosinophils Absolute: 403 cells/uL (ref 15–500)
Eosinophils Relative: 6.3 %
HCT: 47.6 % (ref 38.5–50.0)
Hemoglobin: 16.4 g/dL (ref 13.2–17.1)
Lymphs Abs: 3046 cells/uL (ref 850–3900)
MCH: 30.8 pg (ref 27.0–33.0)
MCHC: 34.5 g/dL (ref 32.0–36.0)
MCV: 89.5 fL (ref 80.0–100.0)
MPV: 11 fL (ref 7.5–12.5)
Monocytes Relative: 7.7 %
Neutro Abs: 2374 cells/uL (ref 1500–7800)
Neutrophils Relative %: 37.1 %
Platelets: 208 10*3/uL (ref 140–400)
RBC: 5.32 10*6/uL (ref 4.20–5.80)
RDW: 12.8 % (ref 11.0–15.0)
Total Lymphocyte: 47.6 %
WBC: 6.4 10*3/uL (ref 3.8–10.8)

## 2021-08-05 NOTE — Progress Notes (Signed)
CBC and CMP WNL

## 2021-08-06 ENCOUNTER — Ambulatory Visit: Payer: 59 | Admitting: Orthopedic Surgery

## 2021-08-13 ENCOUNTER — Other Ambulatory Visit: Payer: Self-pay | Admitting: Physician Assistant

## 2021-08-13 ENCOUNTER — Ambulatory Visit: Payer: Medicaid Other | Admitting: Rheumatology

## 2021-08-13 DIAGNOSIS — Z79899 Other long term (current) drug therapy: Secondary | ICD-10-CM

## 2021-08-13 DIAGNOSIS — M47819 Spondylosis without myelopathy or radiculopathy, site unspecified: Secondary | ICD-10-CM

## 2021-08-13 DIAGNOSIS — H209 Unspecified iridocyclitis: Secondary | ICD-10-CM

## 2021-08-13 NOTE — Telephone Encounter (Signed)
Next Visit: 11/04/2021  Last Visit: 08/04/2021  Last Fill: 05/20/2021   DX: Spondyloarthropathy  Current Dose per office note 07/28/2021:  Humira 40 mg sq injections every 14 days  Labs: 08/04/2021 CBC and CMP WNL  TB Gold: 12/04/2020 Neg    Okay to refill Humira?

## 2021-08-31 ENCOUNTER — Ambulatory Visit: Payer: 59 | Admitting: Orthopedic Surgery

## 2021-08-31 DIAGNOSIS — R200 Anesthesia of skin: Secondary | ICD-10-CM

## 2021-08-31 DIAGNOSIS — M5412 Radiculopathy, cervical region: Secondary | ICD-10-CM

## 2021-09-03 ENCOUNTER — Encounter: Payer: Self-pay | Admitting: Orthopedic Surgery

## 2021-09-03 NOTE — Progress Notes (Signed)
Office Visit Note   Patient: Leon Mason           Date of Birth: 1990-09-24           MRN: [id number] Visit Date: 08/31/2021 Requested by: Bo Merino, [place],  [place] [address] PCP: Chevis Pretty, FNP  Subjective: Chief Complaint  Patient presents with   Left Elbow - Pain    HPI: Leon Mason is a 31 year old patient with left arm and neck pain.  Been going on for 2 months.  Reports a lot of pain both on the right side of his neck as well as left side of the neck.  Worse over the last month.  He also reports some clicking in his neck.  Works in Ambulance person.  Has had diagnosis of ankylosing spondylitis since last year.  Was put on Humira which helped him a lot.  Has a history of schizophrenia also.  Plays the [place] games.  Reports some scapular pain as well.  Describes numbness and tingling on the left-hand side in the small and ring finger.  Symptoms worse at night.  Takes diclofenac for his symptoms.              ROS: All systems reviewed are negative as they relate to the chief complaint within the history of present illness.  Patient denies  fevers or chills.   Assessment & Plan: Visit Diagnoses:  1. Radiculopathy, cervical region   2. Left arm numbness     Plan: Impression is possible ulnar nerve compression versus left-sided radiculopathy.  Ulnar nerve does not subluxate on the left-hand side.  Ankylosing spondylitis definitely complicates the issue here with his neck and arm.  Plan at this time is EMG nerve study left upper extremity to evaluate ulnar nerve compression and MRI cervical spine to evaluate left-sided radiculopathy.  Follow-Up Instructions: No follow-ups on file.   Orders:  Orders Placed This Encounter  Procedures   MR Cervical Spine w/o contrast   Ambulatory referral to Physical Medicine Rehab   No orders of the defined types were placed in this encounter.     Procedures: No procedures  performed   Clinical Data: No additional findings.  Objective: Vital Signs: There were no vitals taken for this visit.  Physical Exam:   Constitutional: Patient appears well-developed HEENT:  Head: Normocephalic Eyes:EOM are normal Neck: Normal range of motion Cardiovascular: Normal rate Pulmonary/chest: Effort normal Neurologic: Patient is alert Skin: Skin is warm Psychiatric: Patient has normal mood and affect   Ortho Exam: Ortho exam demonstrates pretty reasonable cervical spine range of motion flexion chin to chest extension about 40 degrees rotation 60 degrees bilaterally.  5 out of 5 grip EPL FPL interosseous wrist flexion extension bicep triceps and deltoid strength.  No real weakness to interosseous testing but he does have numbness in the ulnar distribution on the dorsal and palmar aspect of the hand.  Radial pulse intact bilaterally.  Ulnar nerve does not subluxate on the left or right-hand side but there is positive Tinel's on the left-hand side.  Shoulders have full active and passive range of motion with good rotator cuff strength.  Specialty Comments:  No specialty comments available.  Imaging: No results found.   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   Back pain without sciatica 02/16/2021   Neck pain 02/16/2021   Generalized joint pain 10/21/2020   Generalized abdominal pain 10/21/2020  Mild intermittent asthma with (acute) exacerbation 02/18/2019   Hypokalemia 02/18/2019   Normocytic anemia 02/18/2019   Elevated transaminase level 02/18/2019   Prolonged QT interval 02/18/2019   Viral syndrome 02/18/2019   SIRS (systemic inflammatory response syndrome) ([place]) 02/18/2019   Globus sensation 07/04/2018   Schizophrenia ([place]) 03/28/2018   MDD (major depressive disorder), recurrent episode, severe ([place]) 03/28/2018   Schizo-affective schizophrenia, chronic condition with acute exacerbation ([place])  03/28/2018   Tobacco use disorder 12/17/2016   Schizoaffective disorder, bipolar type ([place]) 08/23/2016   Alcohol use disorder, severe, dependence ([place]) 08/23/2016   Allergic rhinitis due to pollen 10/09/2015   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   Mild persistent allergic asthma 10/09/2015   Psychosis ([place]) 06/14/2013   Past Medical History:  Diagnosis Date   Anxiety    Asthma    Bipolar 1 disorder ([place])    Hallucinations    Schizoaffective disorder ([place])    Schizophrenia ([place])     Family History  Problem Relation Age of Onset   Bipolar disorder Mother    Alcoholism Mother    Diabetes Mother    Cervical cancer Mother    OCD Father    Bipolar disorder Father    Eczema Brother    Post-traumatic stress disorder Brother    COPD Maternal Aunt    Heart attack Maternal Grandmother    Breast cancer Maternal Grandmother    Heart attack Maternal Grandfather    Cancer Paternal Grandmother    Heart attack Paternal Grandfather    Asthma Neg Hx    Allergic rhinitis Neg Hx    Angioedema Neg Hx    Urticaria Neg Hx    Immunodeficiency Neg Hx    Atopy Neg Hx    Prostate cancer Neg Hx    Colon cancer Neg Hx     Past Surgical History:  Procedure Laterality Date   WISDOM TOOTH EXTRACTION  2009   all four   Social History   Occupational History   Not on file  Tobacco Use   Smoking status: Every Day    Years: 0.50    Types: Cigarettes    Last attempt to quit: 05/28/2018    Years since quitting: 3.2    Passive exposure: Current   Smokeless tobacco: Former  Scientific laboratory technician Use: Former  Substance and Sexual Activity   Alcohol use: Not Currently   Drug use: Not Currently    Types: Marijuana   Sexual activity: Not Currently

## 2021-09-09 ENCOUNTER — Ambulatory Visit (INDEPENDENT_AMBULATORY_CARE_PROVIDER_SITE_OTHER): Payer: 59 | Admitting: Psychiatry

## 2021-09-09 DIAGNOSIS — M47819 Spondylosis without myelopathy or radiculopathy, site unspecified: Secondary | ICD-10-CM

## 2021-09-09 DIAGNOSIS — F203 Undifferentiated schizophrenia: Secondary | ICD-10-CM

## 2021-09-09 DIAGNOSIS — Z636 Dependent relative needing care at home: Secondary | ICD-10-CM

## 2021-09-09 DIAGNOSIS — F411 Generalized anxiety disorder: Secondary | ICD-10-CM

## 2021-09-09 DIAGNOSIS — F1091 Alcohol use, unspecified, in remission: Secondary | ICD-10-CM | POA: Diagnosis not present

## 2021-09-09 DIAGNOSIS — F1291 Cannabis use, unspecified, in remission: Secondary | ICD-10-CM

## 2021-09-09 NOTE — Progress Notes (Signed)
Psychotherapy Progress Note [facility] Leon Mason  Patient ID: Leon Mason [facility] "Leon Mason    MRN: [id number] Therapy format: Individual psychotherapy Date: 09/09/2021      Start: 6:08p     Stop: 6:58p     Time Spent: 50 min Location: In-person   Session narrative (presenting needs, interim history, self-report of stressors and symptoms, applications of prior therapy, status changes, and interventions made in session) Family trip to the beach.  Tried to give mom CBD, hoping it would work better than alcohol, but she hit it as hard as she would alcohol.  She had been essentially dry since November, with brief incidents like stealing a beer from a grocery store.  At the beach, mom rolled her wheelchair out the door, found a cooler of beer a neighbor had out, and stole it, had 6 of them, got mouthy, had to cut the trip short.  Getting along well with Leon Mason, and both still live with mother.  Looking to work again, applying to [employer].  Working on [employer], citing spinal condition (ankylosing spondylitis, in active treatment), carpal tunnel syndrome left arm (attrib to a cervical disc issue).  On Humira, and he has been sober himself a year or more now, he says.  (6 weeks ago specifically said 7 months.)  Affirmed and encouraged.  Gave up on the [place] being any service to mother.  It's all telehealth, and she can't handle that.  Did go to [facility] but feels the papers they gave him will be no help.  Confronted, willing to go back through it with DSS.  Clarified roles of DSS and APS, and possibility [facility] could provide case management.    Brother Leon Mason turned 24, F not helping him with coverage, he's been out of work a year, and is learning disabled.  Discussed options, all of which still involve Leon Mason either serving as his in-house Education officer, museum or connecting Armed forces technical officer to social work.  Therapeutic modalities: Cognitive Behavioral Therapy, Solution-Oriented/Positive  Psychology, Ego-Supportive, and Psycho-education/Bibliotherapy  Mental Status/Observations:  Appearance:   Casual     Behavior:  Appropriate  Motor:  Normal  Speech/Language:   Clear and Coherent  Affect:  Appropriate  Mood:  Mostly modulated, irritable at points .  Generally stressed/depressed  Thought process:  normal  Thought content:    Realistic worries  Sensory/Perceptual disturbances:    WNL  Orientation:  Fully oriented  Attention:  Good    Concentration:  Fair  Memory:  grossly intact  Insight:    Fair  Judgment:   Fair  Impulse Control:  Good   Risk Assessment: Danger to Self: No Self-injurious Behavior: No Danger to Others: No Physical Aggression / Violence: No Duty to Warn: No Access to Firearms a concern: No  Assessment of progress:  stabilized  Diagnosis:   ICD-10-CM   1. Generalized anxiety disorder  F41.1     2. 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 Alcohol use disorder in remission  F10.91     5. 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      Plan:  Get reacquainted with mom's DSS resources Contact Adult Protective Services about what can be done to stabilize the household Recommend ACTT team for mother and/or Leon Mason -- ask DSS or possibly VA Address coverable DME and other health care resources Check on charitable  sources for furniture for elderly or veterans Check on affordable cleaning services (through DSS, or resources he already has) For brother's insurance dilemma, still OK to check marketplace plans, but he may have to participate and provide information, possibly resort to [facility]-Anon as able for caregiver support and coping assistance Other recommendations/advice as may be noted above Continue to utilize previously learned skills ad lib Maintain medication as prescribed and work faithfully with relevant prescriber(s) if any changes are  desired or seem indicated Call the clinic on-call service, 988/hotline, 911, or present to [facility] or ER if any life-threatening psychiatric crisis Return for session(s) already scheduled. Already scheduled visit in this office 10/12/2021.  Leon Serve, PhD Leon Mason Clinical Psychologist, [facility] [address], [place], [place] [address] [phone]

## 2021-09-16 ENCOUNTER — Ambulatory Visit (HOSPITAL_COMMUNITY)
Admission: RE | Admit: 2021-09-16 | Discharge: 2021-09-16 | Disposition: A | Discharge status: Home / Self Care | Payer: 59 | Source: Ambulatory Visit | Attending: Orthopedic Surgery | Admitting: Orthopedic Surgery

## 2021-09-16 DIAGNOSIS — M5412 Radiculopathy, cervical region: Secondary | ICD-10-CM | POA: Insufficient documentation

## 2021-10-12 ENCOUNTER — Ambulatory Visit: Payer: 59 | Admitting: Psychiatry

## 2021-10-13 ENCOUNTER — Encounter: Payer: Self-pay | Admitting: Physical Medicine and Rehabilitation

## 2021-10-13 ENCOUNTER — Ambulatory Visit (INDEPENDENT_AMBULATORY_CARE_PROVIDER_SITE_OTHER): Payer: 59 | Admitting: Physical Medicine and Rehabilitation

## 2021-10-13 DIAGNOSIS — R202 Paresthesia of skin: Secondary | ICD-10-CM

## 2021-10-13 NOTE — Progress Notes (Signed)
Pt state neck pain that travels to his left hand and arm pain. Pt state it hard for him to hold and grip items. Pt state he takes pain meds to help ease his pain. Pt state he right handed.  Numeric Pain Rating Scale and Functional Assessment Average Pain 4   In the last MONTH (on 0-10 scale) has pain interfered with the following?  1. General activity like being  able to carry out your everyday physical activities such as walking, climbing stairs, carrying groceries, or moving a chair?  Rating(9)   -BT,

## 2021-10-15 ENCOUNTER — Ambulatory Visit: Payer: 59 | Admitting: Psychiatry

## 2021-10-15 ENCOUNTER — Telehealth: Payer: Self-pay | Admitting: Psychiatry

## 2021-10-15 DIAGNOSIS — Z91199 Patient's noncompliance with other medical treatment and regimen due to unspecified reason: Secondary | ICD-10-CM

## 2021-10-15 NOTE — Progress Notes (Deleted)
Psychotherapy Progress Note [facility] [name], PhD LP  Patient ID: Leon Mason [facility] "Leon Mason")    MRN: [id number] Therapy format: {Therapy Types:21967::"Individual psychotherapy"} Date: 10/15/2021      Start: ***:***     Stop: ***:***     Time Spent: *** min Location: {SvcLoc:22530::"In-person"}   Session narrative (presenting needs, interim history, self-report of stressors and symptoms, applications of prior therapy, status changes, and interventions made in session) ***  Therapeutic modalities: {AM:23362::"Cognitive Behavioral Therapy","Solution-Oriented/Positive Psychology"}  Mental Status/Observations:  Appearance:   {PSY:22683}     Behavior:  {PSY:21022743}  Motor:  {PSY:22302}  Speech/Language:   {PSY:22685}  Affect:  {PSY:22687}  Mood:  {PSY:31886}  Thought process:  {PSY:31888}  Thought content:    {PSY:[phone]}  Sensory/Perceptual disturbances:    {[phone]}  Orientation:  {Psych Orientation:23301::"Fully oriented"}  Attention:  {Good-Fair-Poor ratings:23770::"Good"}    Concentration:  {Good-Fair-Poor ratings:23770::"Good"}  Memory:  {PSY:[phone]}  Insight:    {Good-Fair-Poor ratings:23770::"Good"}  Judgment:   {Good-Fair-Poor ratings:23770::"Good"}  Impulse Control:  {Good-Fair-Poor ratings:23770::"Good"}   Risk Assessment: Danger to Self: {Risk:22599::"No"} Self-injurious Behavior: {Risk:22599::"No"} Danger to Others: {Risk:22599::"No"} Physical Aggression / Violence: {Risk:22599::"No"} Duty to Warn: {AMYesNo:22526::"No"} Access to Firearms a concern: {AMYesNo:22526::"No"}  Assessment of progress:  {Progress:22147::"progressing"}  Diagnosis: No diagnosis found. Plan:  *** Other recommendations/advice as may be noted above Continue to utilize previously learned skills ad lib Maintain medication as prescribed and work faithfully with relevant prescriber(s) if any changes are desired or seem indicated Call the  clinic on-call service, 988/hotline, 911, or present to [facility] or ER if any life-threatening psychiatric crisis No follow-ups on file. Already scheduled visit in this office 10/20/2021.  Blanchie Serve, PhD [name], PhD LP Clinical Psychologist, [facility] [address] [place], [place] [address] [phone]

## 2021-10-15 NOTE — Telephone Encounter (Signed)
Admin note for non-service contact  Patient ID: DOC MANDALA  MRN: [id number] DATE: 10/15/2021  NS for session Monday, RS'd to this morning, then TC received that he has not transportation d/t B having the car for an appt of his own.  Content to keep 9/5 as scheduled.  Pt is near-indigent, compromised organizational abilities, and in a chronically high-stress situation with disabled, alcoholic M, disabled B, chronic pain condition, unemployed, and applying for SSDI himself.  No charge.  Blanchie Serve, PhD [name], PhD LP Clinical Psychologist, [facility] [address] [place], [place] [address] [phone]

## 2021-10-18 NOTE — Progress Notes (Addendum)
Leon Mason - 31 y.o. male MRN [id number]  Date of birth: [birth year]  Office Visit Note: Visit Date: 10/13/2021 PCP: Chevis Pretty, FNP Referred by: Meredith Pel, MD  Subjective: Chief Complaint  Patient presents with   Neck - Pain, Numbness   Left Arm - Pain, Numbness   Left Hand - Pain, Numbness   Left Wrist - Pain, Numbness   HPI:  Leon Mason is a 31 y.o. male who comes in today at the request of Dr. Anderson Malta for electrodiagnostic study of the Left upper extremities.  Patient is Left hand dominant.  Patient endorses chronic worsening neck pain but also with left sided referral pain in the arm and hand particularly in the ulnar digits.  Denies any right-sided complaints.  Has history of ankylosing spondylitis.  Had a episode where he had a significant flare with a lot of swelling at one point.  Now takes Humira which is controlled that to some degree.  No prior electrodiagnostic studies.  Dr. Marlou Sa did obtain MRI of the cervical spine with very mild C6 foraminal narrowing but otherwise pretty normal for his age.   ROS Otherwise per HPI.  Assessment & Plan: Visit Diagnoses:    ICD-10-CM   1. 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 **report error says "wrist" but is ELBOW.  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  There is no significant electrodiagnostic evidence of any other focal nerve entrapment, brachial plexopathy or cervical radiculopathy.   Recommendations: 1.  Follow-up with referring physician. 2.  Continue current management of symptoms. 3.  Suggest surgical evaluation.  Meds & Orders: No orders of the defined types were placed in this encounter.   Orders Placed This  Encounter  Procedures   NCV with EMG (electromyography)    Follow-up: Return in about 2 weeks (around 10/27/2021) for  G. Alphonzo Severance, MD.   Procedures: No procedures performed  EMG & NCV Findings: Evaluation of the left ulnar motor nerve showed decreased conduction velocity (A Elbow-B Elbow, 44 m/s).  The left median (across palm) sensory nerve showed prolonged distal peak latency (Wrist, 3.8 ms) and prolonged distal peak latency (Palm, 2.1 ms).  The left ulnar sensory nerve showed reduced amplitude (10.9 V).  All remaining nerves (as indicated in the following tables) were within normal limits.    All examined muscles (as indicated in the following table) showed no evidence of electrical instability.    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  There is no significant electrodiagnostic evidence of any other focal nerve entrapment, brachial plexopathy or cervical radiculopathy.   Recommendations: 1.  Follow-up with referring physician. 2.  Continue current management of symptoms. 3.  Suggest surgical evaluation.  ___________________________ Laurence Spates FAAPMR Board Certified, American Board of Physical Medicine and Rehabilitation    Nerve Conduction Studies Anti Sensory Summary Table   Stim Site NR Peak (ms) Norm Peak (ms) P-T Amp (V) Norm P-T Amp Site1 Site2 Delta-P (ms) Dist (cm) Vel (m/s) Norm Vel (m/s)  Left  Median Acr Palm Anti Sensory (2nd Digit)  31.3C  Wrist    *3.8 <3.6 19.0 >10 Wrist Palm 1.7 0.0    Palm    *2.1 <2.0 19.5         Left Radial Anti Sensory (Base 1st Digit)  31.4C  Wrist    2.2 <3.1 17.7  Wrist Base 1st Digit 2.2 0.0    Left Ulnar Anti Sensory (5th Digit)  31.7C  Wrist    3.4 <3.7 *10.9 >15.0 Wrist 5th Digit 3.4  14.0 41 >38   Motor Summary Table   Stim Site NR Onset (ms) Norm Onset (ms) O-P Amp (mV) Norm O-P Amp Site1 Site2 Delta-0 (ms) Dist (cm) Vel (m/s) Norm Vel (m/s)  Left Median Motor (Abd Poll Brev)  31.7C  Wrist    3.7 <4.2 6.6 >5 Elbow Wrist 5.0 26.5 53 >50  Elbow    8.7  4.3         Left Ulnar Motor (Abd Dig Min)  31.7C  Wrist    3.2 <4.2 10.2 >3 B Elbow Wrist 5.2 28.0 54 >53  B Elbow    8.4  10.1  A Elbow B Elbow 2.5 11.0 *44 >53  A Elbow    10.9  7.8          EMG   Side Muscle Nerve Root Ins Act Fibs Psw Amp Dur Poly Recrt Int Fraser Din Comment  Left 1stDorInt Ulnar C8-T1 Nml Nml Nml Nml Nml 0 Nml Nml   Left Abd Poll Brev Median C8-T1 Nml Nml Nml Nml Nml 0 Nml Nml   Left ExtDigCom   Nml Nml Nml Nml Nml 0 Nml Nml   Left Triceps Radial C6-7-8 Nml Nml Nml Nml Nml 0 Nml Nml   Left Deltoid Axillary C5-6 Nml Nml Nml Nml Nml 0 Nml Nml     Nerve Conduction Studies Anti Sensory Left/Right Comparison   Stim Site L Lat (ms) R Lat (ms) L-R Lat (ms) L Amp (V) R Amp (V) L-R Amp (%) Site1 Site2 L Vel (m/s) R Vel (m/s) L-R Vel (m/s)  Median Acr Palm Anti Sensory (2nd Digit)  31.3C  Wrist *3.8   19.0   Wrist Palm     Palm *2.1   19.5         Radial Anti Sensory (Base 1st Digit)  31.4C  Wrist 2.2   17.7   Wrist Base 1st Digit     Ulnar Anti Sensory (5th Digit)  31.7C  Wrist 3.4   *10.9   Wrist 5th Digit 41     Motor Left/Right Comparison   Stim Site L Lat (ms) R Lat (ms) L-R Lat (ms) L Amp (mV) R Amp (mV) L-R Amp (%) Site1 Site2 L Vel (m/s) R Vel (m/s) L-R Vel (m/s)  Median Motor (Abd Poll Brev)  31.7C  Wrist 3.7   6.6   Elbow Wrist 53    Elbow 8.7   4.3         Ulnar Motor (Abd Dig Min)  31.7C  Wrist 3.2   10.2   B Elbow Wrist 54    B Elbow 8.4   10.1   A Elbow B Elbow *44    A Elbow 10.9   7.8            Waveforms:             Clinical History: MRI CERVICAL SPINE WITHOUT CONTRAST   TECHNIQUE: Multiplanar, multisequence MR imaging of the cervical spine was performed.  No intravenous contrast was administered.   COMPARISON:  Prior radiograph from 12/04/2020.   FINDINGS: Alignment: Vertebral bodies normally aligned with preservation of the normal cervical lordosis. No listhesis.   Vertebrae: Vertebral body height maintained without acute or chronic fracture. Bone marrow signal intensity overall within normal limits. No discrete or worrisome osseous lesions. No abnormal marrow edema.   Cord: Normal signal and morphology.   Posterior Fossa, vertebral arteries, paraspinal tissues: Unremarkable.   Disc levels:   C2-C3: Mild uncovertebral spurring without significant disc bulge. No spinal stenosis. Foramina remain patent.   C3-C4: Minimal disc bulge with uncovertebral spurring. No spinal stenosis. Foramina remain patent.   C4-C5: Minimal uncovertebral spurring without significant disc bulge. No spinal stenosis. Foramina remain patent.   C5-C6: Minimal disc bulge with uncovertebral spurring. No significant spinal stenosis. Mild left C6 foraminal narrowing. Right neural foramen remains patent.   C6-C7: Mild intervertebral disc space narrowing. Mild disc bulge with uncovertebral spurring. No spinal stenosis. Foramina remain patent.   C7-T1: Negative interspace. Mild left-sided facet hypertrophy. No canal or foraminal stenosis.   Visualized upper thoracic spine demonstrates no significant finding.   IMPRESSION: 1. Mild multilevel cervical spondylosis without significant spinal stenosis or overt neural impingement. 2. Mild left C6 foraminal narrowing related to disc bulge and uncovertebral spurring. No other significant foraminal encroachment within the cervical spine. No other findings to explain patient's left upper extremity radicular symptoms.     Electronically Signed   By: Jeannine Boga M.D.   On: 09/17/2021 07:48     Objective:  VS:  HT:    WT:   BMI:     BP:   HR: bpm  TEMP: ( )  RESP:  Physical  Exam Musculoskeletal:        General: No tenderness.     Comments: Inspection reveals no atrophy of the bilateral APB or FDI or hand intrinsics. There is no swelling, color changes, allodynia or dystrophic changes. There is 5 out of 5 strength in the bilateral wrist extension, finger abduction and long finger flexion. There is intact sensation to light touch in all dermatomal and peripheral nerve distributions.  Dysesthesia over in the ulnar nerve distribution on the left.  There is a negative Froment's test bilaterally. There is a negative Hoffmann's test bilaterally.  Skin:    General: Skin is warm and dry.     Findings: No erythema or rash.  Neurological:     General: No focal deficit present.     Mental Status: He is alert and oriented to person, place, and time.     Sensory: No sensory deficit.     Motor: No weakness or abnormal muscle tone.     Coordination: Coordination normal.     Gait: Gait normal.  Psychiatric:        Mood and Affect: Mood normal.        Behavior: Behavior normal.        Thought Content: Thought content normal.      Imaging: No results found.

## 2021-10-18 NOTE — Procedures (Signed)
EMG & NCV Findings: Evaluation of the left ulnar motor nerve showed decreased conduction velocity (A Elbow-B Elbow, 44 m/s).  The left median (across palm) sensory nerve showed prolonged distal peak latency (Wrist, 3.8 ms) and prolonged distal peak latency (Palm, 2.1 ms).  The left ulnar sensory nerve showed reduced amplitude (10.9 V).  All remaining nerves (as indicated in the following tables) were within normal limits.    All examined muscles (as indicated in the following table) showed no evidence of electrical instability.    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  There is no significant electrodiagnostic evidence of any other focal nerve entrapment, brachial plexopathy or cervical radiculopathy.   Recommendations: 1.  Follow-up with referring physician. 2.  Continue current management of symptoms. 3.  Suggest surgical evaluation.  ___________________________ Laurence Spates FAAPMR Board Certified, American Board of Physical Medicine and Rehabilitation    Nerve Conduction Studies Anti Sensory Summary Table   Stim Site NR Peak (ms) Norm Peak (ms) P-T Amp (V) Norm P-T Amp Site1 Site2 Delta-P (ms) Dist (cm) Vel (m/s) Norm Vel (m/s)  Left Median Acr Palm Anti Sensory (2nd Digit)  31.3C  Wrist    *3.8 <3.6 19.0 >10 Wrist Palm 1.7 0.0    Palm    *2.1 <2.0 19.5         Left Radial Anti Sensory (Base 1st Digit)  31.4C  Wrist    2.2 <3.1 17.7  Wrist Base 1st Digit 2.2 0.0    Left Ulnar Anti Sensory (5th Digit)  31.7C  Wrist    3.4 <3.7 *10.9 >15.0 Wrist 5th Digit 3.4 14.0 41 >38   Motor Summary Table   Stim Site NR Onset (ms) Norm Onset (ms) O-P Amp (mV) Norm O-P Amp Site1 Site2 Delta-0 (ms) Dist (cm) Vel (m/s) Norm Vel (m/s)  Left Median Motor  (Abd Poll Brev)  31.7C  Wrist    3.7 <4.2 6.6 >5 Elbow Wrist 5.0 26.5 53 >50  Elbow    8.7  4.3         Left Ulnar Motor (Abd Dig Min)  31.7C  Wrist    3.2 <4.2 10.2 >3 B Elbow Wrist 5.2 28.0 54 >53  B Elbow    8.4  10.1  A Elbow B Elbow 2.5 11.0 *44 >53  A Elbow    10.9  7.8          EMG   Side Muscle Nerve Root Ins Act Fibs Psw Amp Dur Poly Recrt Int Fraser Din Comment  Left 1stDorInt Ulnar C8-T1 Nml Nml Nml Nml Nml 0 Nml Nml   Left Abd Poll Brev Median C8-T1 Nml Nml Nml Nml Nml 0 Nml Nml   Left ExtDigCom   Nml Nml Nml Nml Nml 0 Nml Nml   Left Triceps Radial C6-7-8 Nml Nml Nml Nml Nml 0 Nml Nml   Left Deltoid Axillary C5-6 Nml Nml Nml Nml Nml 0 Nml Nml     Nerve Conduction Studies Anti Sensory Left/Right Comparison   Stim Site L Lat (ms) R Lat (ms) L-R Lat (ms) L Amp (V) R Amp (V) L-R Amp (%) Site1 Site2 L Vel (m/s) R Vel (m/s) L-R Vel (m/s)  Median Acr Palm Anti Sensory (2nd Digit)  31.3C  Wrist *3.8   19.0  Wrist Palm     Palm *2.1   19.5         Radial Anti Sensory (Base 1st Digit)  31.4C  Wrist 2.2   17.7   Wrist Base 1st Digit     Ulnar Anti Sensory (5th Digit)  31.7C  Wrist 3.4   *10.9   Wrist 5th Digit 41     Motor Left/Right Comparison   Stim Site L Lat (ms) R Lat (ms) L-R Lat (ms) L Amp (mV) R Amp (mV) L-R Amp (%) Site1 Site2 L Vel (m/s) R Vel (m/s) L-R Vel (m/s)  Median Motor (Abd Poll Brev)  31.7C  Wrist 3.7   6.6   Elbow Wrist 53    Elbow 8.7   4.3         Ulnar Motor (Abd Dig Min)  31.7C  Wrist 3.2   10.2   B Elbow Wrist 54    B Elbow 8.4   10.1   A Elbow B Elbow *44    A Elbow 10.9   7.8            Waveforms:

## 2021-10-20 ENCOUNTER — Encounter: Payer: Self-pay | Admitting: Adult Health

## 2021-10-20 ENCOUNTER — Ambulatory Visit (INDEPENDENT_AMBULATORY_CARE_PROVIDER_SITE_OTHER): Payer: 59 | Admitting: Adult Health

## 2021-10-20 DIAGNOSIS — F411 Generalized anxiety disorder: Secondary | ICD-10-CM | POA: Diagnosis not present

## 2021-10-20 DIAGNOSIS — F203 Undifferentiated schizophrenia: Secondary | ICD-10-CM

## 2021-10-20 DIAGNOSIS — G4700 Insomnia, unspecified: Secondary | ICD-10-CM | POA: Diagnosis not present

## 2021-10-20 DIAGNOSIS — F331 Major depressive disorder, recurrent, moderate: Secondary | ICD-10-CM

## 2021-10-20 MED ORDER — PERPHENAZINE 4 MG PO TABS: 4.0000 mg | ORAL_TABLET | Freq: Two times a day (BID) | ORAL | 5 refills | Status: DC

## 2021-10-20 MED ORDER — SERTRALINE HCL 50 MG PO TABS: 50.0000 mg | ORAL_TABLET | Freq: Every day | ORAL | 5 refills | Status: DC

## 2021-10-20 NOTE — Progress Notes (Signed)
Leon Mason [id number] [birth year] 31 y.o.  Subjective:   Patient ID:  Leon Mason is a 31 y.o. (DOB 1990/11/03) male.  Chief Complaint: No chief complaint on file.   HPI Leon Mason presents to the office today for follow-up of  MDD, GAD, insomnia, Schizophrenia.  Describes mood today as "ok". Pleasant. Mood symptoms - reports depression, anxiety, and irritability. Mood has been lower. Stating "I'm waiting for things to change for me". Feels frustrated with current living situation - brother with mental health issues. Is hopeful about being awarded disability benefits - awaiting a determination. Stating "it would change my life". Wanting to be able to get out and take care of himself. Denies THC and ETOH x 1 year. Concerned about health issues. Living at home with mother and brother - helping mother out with her care. Decreased interest and motivation. Taking medications as prescribed. Energy levels lower. Active, does not have a regular exercise routine. Walking and doing push ups. Enjoys some usual interests and activities. Single. Lives with mother (disabled) - brother 3 cats and a dog. Spending time with family - Aunt and uncle local.  Appetite adequate. Weight gain 275 to 285 pounds - using food to cope with stress. Sleeping better some nights than others. Averages 6 hours at night and takes a 3 hour nap every day. Focus and concentration stable. Completing tasks. Managing aspects of household. Currently unemployed. Denies SI or HI.  Denies AH or VH. Reports paranoid thoughts. Denies self harm. 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    Seeing [name] for therapy.  Previous medications: Perphenazine, Lithium, Sertraline, Haldol, Zyprexa, Risperdal, stopped due to various side effects, ie biting tongue a lot, restless legs, weight gain   [facility] Visit from 06/15/2021 in [place] Visit from 05/29/2021 in [place] Visit from 02/16/2021 in [place] Visit from 10/21/2020 in [place]  Total GAD-7 Score '8 8 16 13      '$ PHQ2-9    [place] Visit from 06/15/2021 in [place] Visit from 05/29/2021 in [place] [facility] Visit from 02/16/2021 in [place] Visit from 10/21/2020 in [place] Visit from 12/10/2017 in [place]  PHQ-2 Total Score '3 2 3 6 3  '$ PHQ-9 Total Score '6 7 7 17 8      '$ Flowsheet Row ED from 08/25/2020 in [facility] [place] [facility] from 08/02/2020 in [facility] from 06/24/2020 in [facility] [place] No Risk No Risk No Risk        Review of Systems:  Review of Systems  Musculoskeletal:  Negative for gait problem.  Neurological:  Negative for tremors.  Psychiatric/Behavioral:         Please refer to HPI    Medications: I have reviewed the patient's current medications.  Current Outpatient Medications  Medication Sig Dispense Refill   acetaminophen (TYLENOL) 500 MG tablet Take 1 tablet (500 mg total) by mouth every 6 (six) hours as needed. 30 tablet 0   albuterol (PROVENTIL) (2.5 MG/3ML) 0.083% nebulizer solution Inhale 3 mLs (2.5 mg total) into the lungs every 6 (six) hours as needed for wheezing or shortness of breath. 75 mL 1   albuterol (VENTOLIN HFA) 108 (90 Base) MCG/ACT inhaler  INHALE 1 TO 2 PUFFS BY MOUTH EVERY 6 HOURS AS NEEDED FOR WHEEZING FOR SHORTNESS OF BREATH     amoxicillin-clavulanate (AUGMENTIN) 875-125 MG tablet Take 1 tablet by mouth 2 (two) times daily. (Patient not taking: Reported on 08/04/2021) 20 tablet 0   diclofenac Sodium (VOLTAREN) 1 % GEL Apply 2 g topically 4 (four) times daily. 100 g 1   fluticasone-salmeterol (ADVAIR) 250-50 MCG/ACT AEPB  Inhale 1 puff into the lungs in the morning and at bedtime. Rinse mouth after each use 60 each 12   HUMIRA PEN 40 MG/0.4ML PNKT INJECT 1 PEN SUBCUTANEOUSLY EVERY 14 DAYS 6 each 0   IBUPROFEN PO Take by mouth as needed.     imiquimod (ALDARA) 5 % cream Apply a thin layer 3 times per week (on alternate days) prior to bedtime; leave on skin for 6 to 10 hours, then remove with mild soap and water.  Max 16 weeks (Patient not taking: Reported on 08/04/2021) 12 each 3   levalbuterol (XOPENEX HFA) 45 MCG/ACT inhaler Inhale 2 puffs into the lungs every 6 (six) hours as needed for wheezing or shortness of breath. (Patient not taking: Reported on 08/04/2021) 1 each 1   levocetirizine (XYZAL) 5 MG tablet Take 1 tablet (5 mg total) by mouth every evening. For allergies. Use instead of Claritin 90 tablet 3   Multiple Vitamin (MULTIVITAMIN PO) Take by mouth daily.     Omega-3 Fatty Acids (FISH OIL PO) Take by mouth daily.     perphenazine (TRILAFON) 4 MG tablet Take 1 tablet (4 mg total) by mouth 2 (two) times daily. 60 tablet 5   predniSONE (DELTASONE) 20 MG tablet Take 3 tabs daily for 1 week, then 2 tabs daily for week 2, then 1 tab daily for week 3. (Patient not taking: Reported on 08/04/2021) 42 tablet 0   sertraline (ZOLOFT) 50 MG tablet Take 1 tablet (50 mg total) by mouth daily. 30 tablet 5   No current facility-administered medications for this visit.    Medication Side Effects: None  Allergies:  Allergies  Allergen Reactions   Other Hives and Shortness Of Breath    Dogs and Cats.   Dust Mite Extract    Pollen Extract     Past Medical History:  Diagnosis Date   Anxiety    Asthma    Bipolar 1 disorder ([place])    Hallucinations    Schizoaffective disorder (HCC)    Schizophrenia (HCC)     Past Medical History, Surgical history, Social history, and Family history were reviewed and updated as appropriate.   Please see review of systems for further details on the patient's review from today.    Objective:   Physical Exam:  There were no vitals taken for this visit.  Physical Exam Constitutional:      General: He is not in acute distress. Musculoskeletal:        General: No deformity.  Neurological:     Mental Status: He is alert and oriented to person, place, and time.     Coordination: Coordination normal.  Psychiatric:        Attention and Perception: Attention and perception normal. He does not perceive auditory or visual hallucinations.        Mood and Affect: Mood normal. Mood is not anxious or depressed. Affect is not labile, blunt, angry or inappropriate.        Speech: Speech normal.        Behavior: Behavior normal.  Thought Content: Thought content normal. Thought content is not paranoid or delusional. Thought content does not include homicidal or suicidal ideation. Thought content does not include homicidal or suicidal plan.        Cognition and Memory: Cognition and memory normal.        Judgment: Judgment normal.     Comments: Insight intact     Lab Review:     Component Value Date/Time   NA 139 08/04/2021 1450   NA 138 05/06/2021 1357   K 3.9 08/04/2021 1450   CL 103 08/04/2021 1450   CO2 25 08/04/2021 1450   GLUCOSE 86 08/04/2021 1450   BUN 15 08/04/2021 1450   BUN 7 05/06/2021 1357   CREATININE 0.92 08/04/2021 1450   CALCIUM 9.8 08/04/2021 1450   PROT 7.3 08/04/2021 1450   PROT 7.7 05/06/2021 1357   ALBUMIN 5.0 05/06/2021 1357   AST 16 08/04/2021 1450   ALT 21 08/04/2021 1450   ALKPHOS 56 05/06/2021 1357   BILITOT 0.6 08/04/2021 1450   BILITOT 0.5 05/06/2021 1357   GFRNONAA >60 02/19/2019 0602   GFRAA >60 02/19/2019 0602       Component Value Date/Time   WBC 6.4 08/04/2021 1450   RBC 5.32 08/04/2021 1450   HGB 16.4 08/04/2021 1450   HGB 16.4 05/06/2021 1357   HCT 47.6 08/04/2021 1450   HCT 48.1 05/06/2021 1357   PLT 208 08/04/2021 1450   PLT 187 05/06/2021 1357   MCV 89.5 08/04/2021 1450   MCV 90 05/06/2021 1357   MCH  30.8 08/04/2021 1450   MCHC 34.5 08/04/2021 1450   RDW 12.8 08/04/2021 1450   RDW 12.8 05/06/2021 1357   LYMPHSABS 3,046 08/04/2021 1450   MONOABS 0.4 02/19/2019 0602   EOSABS 403 08/04/2021 1450   BASOSABS 83 08/04/2021 1450    Lithium Lvl  Date Value Ref Range Status  03/30/2018 <0.06 (L) 0.60 - 1.20 mmol/L Final    Comment:    REPEATED TO VERIFY Performed at [facility], [facility] [address], [place], [place] [address]      No results found for: "PHENYTOIN", "PHENOBARB", "VALPROATE", "CBMZ"   .res Assessment: Plan:    Plan:  PDMP reviewed  1. Continue Zoloft '50mg'$   2. Increase Perphenazine '2mg'$  TID to '4mg'$  at at hs.  Restart therapy - Andy Mitchum.  EKG 05/2021  RTC 6 months  Patient advised to contact office with any questions, adverse effects, or acute worsening in signs and symptoms.  Discussed potential metabolic side effects associated with atypical antipsychotics, as well as potential risk for movement side effects. Advised pt to contact office if movement side effects occur.     Diagnoses and all orders for this visit:  Undifferentiated schizophrenia ([place]) -     sertraline (ZOLOFT) 50 MG tablet; Take 1 tablet (50 mg total) by mouth daily. -     perphenazine (TRILAFON) 4 MG tablet; Take 1 tablet (4 mg total) by mouth 2 (two) times daily.  Generalized anxiety disorder -     sertraline (ZOLOFT) 50 MG tablet; Take 1 tablet (50 mg total) by mouth daily.  Insomnia, unspecified type -     sertraline (ZOLOFT) 50 MG tablet; Take 1 tablet (50 mg total) by mouth daily.  Major depressive disorder, recurrent episode, moderate (HCC) -     sertraline (ZOLOFT) 50 MG tablet; Take 1 tablet (50 mg total) by mouth daily.     Please see After Visit Summary for patient specific instructions.  Future  Appointments  Date Time Provider [place]  10/28/2021  1:45 PM Meredith Pel, MD [facility] None  11/04/2021  2:20 PM Bo Merino, MD [facility]  None  11/10/2021  4:00 PM Blanchie Serve, PhD [facility] None  12/16/2021  5:00 PM Blanchie Serve, PhD [facility] None    No orders of the defined types were placed in this encounter.   -------------------------------

## 2021-10-21 NOTE — Progress Notes (Signed)
Office Visit Note  Patient: Leon Mason             Date of Birth: 1990/12/17           MRN: [id number]             PCP: Chevis Pretty, FNP Referring: Chevis Pretty, * Visit Date: 11/04/2021 Occupation: '@GUAROCC' @  Subjective:  Medication management  History of Present Illness: Leon Mason is a 31 y.o. male with history of spondyloarthropathy.  He states he has been taking Humira injections 40 mg subcu every other week.  His last injection was 2 weeks ago.  He continues to have discomfort in his neck and upper back.  He also continues to have discomfort in his left elbow.  He has been under care of Dr. Marlou Sa.  He had MRI of the cervical spine on September 17, 2020.  He has a follow-up appointment with Dr. Marlou Sa.  Denies any recurrence of iritis.  There is no history of [place] fasciitis or Achilles tendinitis.  Activities of Daily Living:  Patient reports morning stiffness for 1 hour.   Patient Reports nocturnal pain.  Difficulty dressing/grooming: Denies Difficulty climbing stairs: Denies Difficulty getting out of chair: Denies Difficulty using hands for taps, buttons, cutlery, and/or writing: Reports  Review of Systems  Constitutional:  Positive for fatigue.  HENT:  Negative for mouth sores and mouth dryness.   Eyes:  Negative for dryness.  Respiratory:  Positive for shortness of breath.   Cardiovascular:  Positive for palpitations. Negative for chest pain.  Gastrointestinal:  Negative for blood in stool, constipation and diarrhea.  Endocrine: Negative for increased urination.  Genitourinary:  Negative for involuntary urination.  Musculoskeletal:  Positive for joint pain, joint pain, myalgias, muscle weakness, morning stiffness, muscle tenderness and myalgias. Negative for gait problem and joint swelling.  Skin:  Negative for color change, rash, hair loss and sensitivity to sunlight.  Allergic/Immunologic: Negative for susceptible to infections.  Neurological:   Positive for numbness and headaches. Negative for dizziness.  Hematological:  Negative for swollen glands.  Psychiatric/Behavioral:  Positive for depressed mood and sleep disturbance. The patient is nervous/anxious.     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   Back pain without sciatica 02/16/2021   Neck pain 02/16/2021   Generalized joint pain 10/21/2020   Generalized abdominal pain 10/21/2020   Mild intermittent asthma with (acute) exacerbation 02/18/2019   Hypokalemia 02/18/2019   Normocytic anemia 02/18/2019   Elevated transaminase level 02/18/2019   Prolonged QT interval 02/18/2019   Viral syndrome 02/18/2019   SIRS (systemic inflammatory response syndrome) ([place]) 02/18/2019   Globus sensation 07/04/2018   Schizophrenia ([place]) 03/28/2018   MDD (major depressive disorder), recurrent episode, severe ([place]) 03/28/2018   Schizo-affective schizophrenia, chronic condition with acute exacerbation ([place]) 03/28/2018   Tobacco use disorder 12/17/2016   Schizoaffective disorder, bipolar type ([place]) 08/23/2016   Alcohol use disorder, severe, dependence ([place]) 08/23/2016   Allergic rhinitis due to pollen 10/09/2015   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   Mild persistent allergic asthma 10/09/2015   Psychosis ([place]) 06/14/2013    Past Medical History:  Diagnosis Date   Anxiety    Asthma    Bipolar 1 disorder ([place])    Hallucinations    Schizoaffective disorder ([place])    Schizophrenia ([place])     Family History  Problem Relation Age of Onset   Bipolar disorder Mother    Alcoholism Mother  Diabetes Mother    Cervical cancer Mother    OCD Father    Bipolar disorder Father    Eczema Brother    Post-traumatic stress disorder Brother    COPD Maternal Aunt    Heart attack Maternal Grandmother    Breast cancer Maternal Grandmother    Heart attack Maternal Grandfather    Cancer Paternal Grandmother    Heart attack Paternal Grandfather     Asthma Neg Hx    Allergic rhinitis Neg Hx    Angioedema Neg Hx    Urticaria Neg Hx    Immunodeficiency Neg Hx    Atopy Neg Hx    Prostate cancer Neg Hx    Colon cancer Neg Hx    Past Surgical History:  Procedure Laterality Date   WISDOM TOOTH EXTRACTION  2009   all four   Social History   Social History Narrative   Not on file   Immunization History  Administered Date(s) Administered   DTaP 01/03/1990, 03/17/1990, 06/08/1990, 02/20/1991, 06/01/1994   Hepatitis B 01/05/2001, 02/01/2001, 05/04/2005   Hepatitis B, ped/adol 01/05/2001, 02/01/2001, 05/04/2005   HiB (PRP-OMP) 01/03/1990, 03/17/1990, 06/08/1990, 02/20/1991   IPV 01/03/1990, 03/17/1990, 02/20/1991, 06/01/1994   Influenza,inj,Quad PF,6+ Mos 02/20/2019, 02/16/2021   Influenza,inj,quad, With Preservative 03/18/2018   Influenza-Unspecified 03/18/2018   MMR 02/20/1991, 06/01/1994   Meningococcal Conjugate 05/04/2005   Pneumococcal Polysaccharide-23 08/29/2016, 02/20/2019   Tdap 05/04/2005     Objective: Vital Signs: BP 118/76 (BP Location: Left Arm, Patient Position: Sitting, Cuff Size: Normal)   Pulse 65   Resp 17   Ht '6\' 3"'  (1.905 m)   Wt 288 lb 3.2 oz (130.7 kg)   BMI 36.02 kg/m    Physical Exam Vitals and nursing note reviewed.  Constitutional:      Appearance: He is well-developed.  HENT:     Head: Normocephalic and atraumatic.  Eyes:     Conjunctiva/sclera: Conjunctivae normal.     Pupils: Pupils are equal, round, and reactive to light.  Cardiovascular:     Rate and Rhythm: Normal rate and regular rhythm.     Heart sounds: Normal heart sounds.  Pulmonary:     Effort: Pulmonary effort is normal.     Breath sounds: Normal breath sounds.  Abdominal:     General: Bowel sounds are normal.     Palpations: Abdomen is soft.  Musculoskeletal:     Cervical back: Normal range of motion and neck supple.  Skin:    General: Skin is warm and dry.     Capillary Refill: Capillary refill takes less than 2  seconds.  Neurological:     Mental Status: He is alert and oriented to person, place, and time.  Psychiatric:        Behavior: Behavior normal.      Musculoskeletal Exam: C-spine was in good range of motion with some stiffness.  He had no tenderness over cervical or thoracic spine.  He had no tenderness over the lumbar spine.  There was no SI joint tenderness.  He had good mobility in his lumbar spine.  He was able to reach his toes without any  discomfort.  Shoulder joints, elbow joints, wrist joints, MCPs PIPs and DIPs with good range of motion.  He had tenderness on palpation over left elbow.  Hip joints and knee joints with good range of motion.  No warmth swelling or effusion was noted.  There was no tenderness over Achilles tendon or plantar fascia.  No synovitis was noted over ankles  or MTPs.  CDAI Exam: CDAI Score: -- Patient Global: --; Provider Global: -- Swollen: --; Tender: -- Joint Exam 11/04/2021   No joint exam has been documented for this visit   There is currently no information documented on the homunculus. Go to the Rheumatology activity and complete the homunculus joint exam.  Investigation: No additional findings.  Imaging: NCV with EMG (electromyography)  Result Date: 10/13/2021 Magnus Sinning, MD     10/18/2021 12:02 PM EMG & NCV Findings: Evaluation of the left ulnar motor nerve showed decreased conduction velocity (A Elbow-B Elbow, 44 m/s).  The left median (across palm) sensory nerve showed prolonged distal peak latency (Wrist, 3.8 ms) and prolonged distal peak latency (Palm, 2.1 ms).  The left ulnar sensory nerve showed reduced amplitude (10.9 V).  All remaining nerves (as indicated in the following tables) were within normal limits.  All examined muscles (as indicated in the following table) showed no evidence of electrical instability.  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 There is no significant electrodiagnostic evidence of any other focal nerve entrapment, brachial plexopathy or cervical radiculopathy. Recommendations: 1.  Follow-up with referring physician. 2.  Continue current management of symptoms. 3.  Suggest surgical evaluation. ___________________________ Laurence Spates FAAPMR Board Certified, American Board of Physical Medicine and Rehabilitation Nerve Conduction Studies Anti Sensory Summary Table  Stim Site NR Peak (ms) Norm Peak (ms) P-T Amp (V) Norm P-T Amp Site1 Site2 Delta-P (ms) Dist (cm) Vel (m/s) Norm Vel (m/s) Left Median Acr Palm Anti Sensory (2nd Digit)  31.3C Wrist    *3.8 <3.6 19.0 >10 Wrist Palm 1.7 0.0   Palm    *2.1 <2.0 19.5        Left Radial Anti Sensory (Base 1st Digit)  31.4C Wrist    2.2 <3.1 17.7  Wrist Base 1st Digit 2.2 0.0   Left Ulnar Anti Sensory (5th Digit)  31.7C Wrist    3.4 <3.7 *10.9 >15.0 Wrist 5th Digit 3.4 14.0 41 >38 Motor Summary Table  Stim Site NR Onset (ms) Norm Onset (ms) O-P Amp (mV) Norm O-P Amp Site1 Site2 Delta-0 (ms) Dist (cm) Vel (m/s) Norm Vel (m/s) Left Median Motor (Abd Poll Brev)  31.7C Wrist    3.7 <4.2 6.6 >5 Elbow Wrist 5.0 26.5 53 >50 Elbow    8.7  4.3        Left Ulnar Motor (Abd Dig Min)  31.7C Wrist    3.2 <4.2 10.2 >3 B Elbow Wrist 5.2 28.0 54 >53 B Elbow    8.4  10.1  A Elbow B Elbow 2.5 11.0 *44 >53 A Elbow    10.9  7.8        EMG  Side Muscle Nerve Root Ins Act Fibs Psw Amp Dur Poly Recrt Int Fraser Din Comment Left 1stDorInt Ulnar C8-T1 Nml Nml Nml Nml Nml 0 Nml Nml  Left Abd Poll Brev Median C8-T1 Nml Nml Nml Nml Nml 0 Nml Nml  Left ExtDigCom   Nml Nml Nml Nml Nml 0 Nml Nml  Left Triceps Radial C6-7-8 Nml Nml Nml Nml Nml 0 Nml Nml  Left Deltoid Axillary C5-6 Nml Nml Nml Nml Nml 0 Nml Nml  Nerve Conduction Studies Anti Sensory Left/Right Comparison  Stim Site L Lat  (ms) R Lat (ms) L-R Lat (ms)  L Amp (V) R Amp (V) L-R Amp (%) Site1 Site2 L Vel (m/s) R Vel (m/s) L-R Vel (m/s) Median Acr Palm Anti Sensory (2nd Digit)  31.3C Wrist *3.8   19.0   Wrist Palm    Palm *2.1   19.5        Radial Anti Sensory (Base 1st Digit)  31.4C Wrist 2.2   17.7   Wrist Base 1st Digit    Ulnar Anti Sensory (5th Digit)  31.7C Wrist 3.4   *10.9   Wrist 5th Digit 41   Motor Left/Right Comparison  Stim Site L Lat (ms) R Lat (ms) L-R Lat (ms) L Amp (mV) R Amp (mV) L-R Amp (%) Site1 Site2 L Vel (m/s) R Vel (m/s) L-R Vel (m/s) Median Motor (Abd Poll Brev)  31.7C Wrist 3.7   6.6   Elbow Wrist 53   Elbow 8.7   4.3        Ulnar Motor (Abd Dig Min)  31.7C Wrist 3.2   10.2   B Elbow Wrist 54   B Elbow 8.4   10.1   A Elbow B Elbow *44   A Elbow 10.9   7.8        Waveforms:         Recent Labs: Lab Results  Component Value Date   WBC 6.4 08/04/2021   HGB 16.4 08/04/2021   PLT 208 08/04/2021   NA 139 08/04/2021   K 3.9 08/04/2021   CL 103 08/04/2021   CO2 25 08/04/2021   GLUCOSE 86 08/04/2021   BUN 15 08/04/2021   CREATININE 0.92 08/04/2021   BILITOT 0.6 08/04/2021   ALKPHOS 56 05/06/2021   AST 16 08/04/2021   ALT 21 08/04/2021   PROT 7.3 08/04/2021   ALBUMIN 5.0 05/06/2021   CALCIUM 9.8 08/04/2021   GFRAA >60 02/19/2019   QFTBGOLDPLUS NEGATIVE 12/04/2020    Speciality Comments: Humira started December 26, 2020  Procedures:  No procedures performed Allergies: Other, Dust mite extract, and Pollen extract   Assessment / Plan:     Visit Diagnoses: Spondyloarthropathy - Inflammatory arthritis, iritis, chronic SI joint pain, chronic pain in neck, thoracic and lumbar region.  HLA-B27 positive: He had good mobility in the spine today.  He had no point tenderness.  No inflammatory arthritis was noted.  He has been taking Humira on a regular basis.  Last injection was 2 weeks ago.  He denies any side effects from Humira.  High risk medication use - Humira 40 mg sq injections every 14  days-initiated Humira on 12/26/20. -Labs obtained on Aug 04, 2021 CBC with differential and CMP with GFR were normal.  TB gold was negative on December 04, 2020.  We will check labs today and every 3 months.  Plan: CBC with Differential/Platelet, COMPLETE METABOLIC PANEL WITH GFR, QuantiFERON-TB Gold Plus.  Information about immunization was placed in the AVS.  He was advised to hold Humira if he develops an infection and resume after the infection resolves.  He was advised to get annual skin examination to screen for skin cancer.  Patient states he has an appointment coming up with the dermatologist.  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  Entrapment of left ulnar nerve-he has been followed by Dr. Marlou Sa.  Chronic pain of left knee-no warmth swelling or effusion was noted.  He has intermittent discomfort.  DDD (degenerative disc disease), cervical-he had MRI of the cervical spine on September 17, 2021 which was reviewed.  He  had mild cervical spondylosis without a spinal stenosis.  Mild left C6 foraminal narrowing related to disc bulge was noted.  We will discuss the results with Dr. Marlou Sa.  He has an appointment coming up.  Pain in thoracic spine - referred to PT at the last visit.  Patient states he could not go for physical therapy.  Stretching exercises were emphasized.  Chronic SI joint pain - referred to PT at the last visit.  He will schedule physical therapy appointment when ready.  Other fatigue-he continues to have fatigue.  Other medical problems are listed as follows:  Allergic urticaria  SIRS (systemic inflammatory response syndrome) ([facility])  Schizoaffective disorder, bipolar type ([facility])  Prolonged QT interval  Palpitations  Smoker-he has cut back on smoking and smoking only 2 to 3 cigarettes a day now.  Alcohol use disorder, severe, dependence ([facility])-patient states that he quit drinking about 1 year ago.  Mild persistent allergic asthma  Orders: Orders  Placed This Encounter  Procedures   CBC with Differential/Platelet   COMPLETE METABOLIC PANEL WITH GFR   QuantiFERON-TB Gold Plus   No orders of the defined types were placed in this encounter.    Follow-Up Instructions: Return in about 5 months (around 04/06/2022) for Spondyloarthropathy.   Bo Merino, MD  Note - This record has been created using Editor, commissioning.  Chart creation errors have been sought, but may not always  have been located. Such creation errors do not reflect on  the standard of medical care.

## 2021-10-27 ENCOUNTER — Telehealth: Payer: Self-pay | Admitting: Orthopedic Surgery

## 2021-10-27 NOTE — Telephone Encounter (Signed)
Received vm fom patient inquiring if we received request for records from [facility]. IC,lmvm advised we have not received request. Advised he can reach out to his case worker and have request faxed here and left our fax number. Pts ph [phone]

## 2021-10-28 ENCOUNTER — Ambulatory Visit: Payer: 59 | Admitting: Orthopedic Surgery

## 2021-11-02 ENCOUNTER — Telehealth: Payer: Self-pay | Admitting: Radiology

## 2021-11-02 NOTE — Telephone Encounter (Signed)
Patient called triage line stating that he missed his appointment and needs to reschedule. He has had a nerve conduction study and MRI cervical spine which he needs to review. I advised Dr. Marlou Sa is booked out until middle of September. Is there somewhere that you would like for me to put patient on the schedule or ok to wait until that appointment? I will be glad to call patient to advise.  [phone]

## 2021-11-04 ENCOUNTER — Encounter: Payer: Self-pay | Admitting: Rheumatology

## 2021-11-04 ENCOUNTER — Ambulatory Visit: Payer: 59 | Attending: Rheumatology | Admitting: Rheumatology

## 2021-11-04 ENCOUNTER — Telehealth: Payer: Self-pay | Admitting: Orthopedic Surgery

## 2021-11-04 VITALS — BP 118/76 | HR 65 | Resp 17 | Ht 75.0 in | Wt 288.2 lb

## 2021-11-04 DIAGNOSIS — R6510 Systemic inflammatory response syndrome (SIRS) of non-infectious origin without acute organ dysfunction: Secondary | ICD-10-CM

## 2021-11-04 DIAGNOSIS — M5030 Other cervical disc degeneration, unspecified cervical region: Secondary | ICD-10-CM

## 2021-11-04 DIAGNOSIS — M25562 Pain in left knee: Secondary | ICD-10-CM

## 2021-11-04 DIAGNOSIS — G8929 Other chronic pain: Secondary | ICD-10-CM

## 2021-11-04 DIAGNOSIS — F1020 Alcohol dependence, uncomplicated: Secondary | ICD-10-CM

## 2021-11-04 DIAGNOSIS — Z79899 Other long term (current) drug therapy: Secondary | ICD-10-CM | POA: Diagnosis not present

## 2021-11-04 DIAGNOSIS — F17200 Nicotine dependence, unspecified, uncomplicated: Secondary | ICD-10-CM

## 2021-11-04 DIAGNOSIS — G5622 Lesion of ulnar nerve, left upper limb: Secondary | ICD-10-CM

## 2021-11-04 DIAGNOSIS — Z1589 Genetic susceptibility to other disease: Secondary | ICD-10-CM

## 2021-11-04 DIAGNOSIS — L500 Allergic urticaria: Secondary | ICD-10-CM

## 2021-11-04 DIAGNOSIS — R002 Palpitations: Secondary | ICD-10-CM

## 2021-11-04 DIAGNOSIS — R9431 Abnormal electrocardiogram [ECG] [EKG]: Secondary | ICD-10-CM

## 2021-11-04 DIAGNOSIS — R5383 Other fatigue: Secondary | ICD-10-CM

## 2021-11-04 DIAGNOSIS — M436 Torticollis: Secondary | ICD-10-CM

## 2021-11-04 DIAGNOSIS — F250 Schizoaffective disorder, bipolar type: Secondary | ICD-10-CM

## 2021-11-04 DIAGNOSIS — H209 Unspecified iridocyclitis: Secondary | ICD-10-CM | POA: Diagnosis not present

## 2021-11-04 DIAGNOSIS — M533 Sacrococcygeal disorders, not elsewhere classified: Secondary | ICD-10-CM

## 2021-11-04 DIAGNOSIS — R7401 Elevation of levels of liver transaminase levels: Secondary | ICD-10-CM

## 2021-11-04 DIAGNOSIS — M47819 Spondylosis without myelopathy or radiculopathy, site unspecified: Secondary | ICD-10-CM | POA: Diagnosis not present

## 2021-11-04 DIAGNOSIS — M546 Pain in thoracic spine: Secondary | ICD-10-CM

## 2021-11-04 DIAGNOSIS — J4530 Mild persistent asthma, uncomplicated: Secondary | ICD-10-CM

## 2021-11-04 NOTE — Patient Instructions (Signed)
Standing Labs We placed an order today for your standing lab work.   Please have your standing labs drawn in November and every 3 months  If possible, please have your labs drawn 2 weeks prior to your appointment so that the provider can discuss your results at your appointment.  Please note that you may see your imaging and lab results in [facility] before we have reviewed them. We may be awaiting multiple results to interpret others before contacting you. Please allow our office up to 72 hours to thoroughly review all of the results before contacting the office for clarification of your results.  We currently have open lab daily: Monday through Thursday from 1:30 PM-4:30 PM and Friday from 1:30 PM- 4:00 PM If possible, please come for your lab work on Monday, Thursday or Friday afternoons, as you may experience shorter wait times.   Effective January 06, 2022 the new lab hours will change to: Monday through Thursday from 1:30 PM-5:00 PM and Friday from 8:30 AM-12:00 PM If possible, please come for your lab work on Monday and Thursday afternoons, as you may experience shorter wait times.  Please be advised, all patients with office appointments requiring lab work will take precedent over walk-in lab work.    The office is located at [address] [place], [place], [place] [address] No appointment is necessary.   Labs are drawn by [facility]. Please bring your co-pay at the time of your lab draw.  You may receive a bill from [facility] for your lab work.  Please note if you are on Hydroxychloroquine and and an order has been placed for a Hydroxychloroquine level, you will need to have it drawn 4 hours or more after your last dose.  If you wish to have your labs drawn at another location, please call the office 24 hours in advance to send orders.  If you have any questions regarding directions or hours of operation,  please call [phone].   As a reminder, please drink plenty of water prior  to coming for your lab work. Thanks!   Vaccines You are taking a medication(s) that can suppress your immune system.  The following immunizations are recommended: Flu annually Covid-19  Td/Tdap (tetanus, diphtheria, pertussis) every 10 years Pneumonia (Prevnar 15 then Pneumovax 23 at least 1 year apart.  Alternatively, can take Prevnar 20 without needing additional dose) Shingrix: 2 doses from 4 weeks to 6 months apart  Please check with your PCP to make sure you are up to date.   If you have signs or symptoms of an infection or start antibiotics: First, call your PCP for workup of your infection. Hold your medication through the infection, until you complete your antibiotics, and until symptoms resolve if you take the following: Injectable medication (Actemra, Benlysta, Cimzia, Cosentyx, Enbrel, Humira, Kevzara, Orencia, Remicade, Simponi, Stelara, Taltz, Tremfya) Methotrexate Leflunomide (Arava) Mycophenolate (Cellcept) Morrie Sheldon, Olumiant, or Rinvoq  Please get an annual skin examination to screen for skin cancer while you are on Humira

## 2021-11-04 NOTE — Telephone Encounter (Signed)
Patient came in office, stating [facility] has not received records. I refaxed records [id number], release ID [id number] processed by [facility].

## 2021-11-08 LAB — CBC WITH DIFFERENTIAL/PLATELET
Absolute Monocytes: 475 cells/uL (ref 200–950)
Basophils Absolute: 73 cells/uL (ref 0–200)
Basophils Relative: 1.1 %
Eosinophils Absolute: 620 cells/uL — ABNORMAL HIGH (ref 15–500)
Eosinophils Relative: 9.4 %
HCT: 46.3 % (ref 38.5–50.0)
Hemoglobin: 16.2 g/dL (ref 13.2–17.1)
Lymphs Abs: 2719 cells/uL (ref 850–3900)
MCH: 30.6 pg (ref 27.0–33.0)
MCHC: 35 g/dL (ref 32.0–36.0)
MCV: 87.4 fL (ref 80.0–100.0)
MPV: 11.6 fL (ref 7.5–12.5)
Monocytes Relative: 7.2 %
Neutro Abs: 2713 cells/uL (ref 1500–7800)
Neutrophils Relative %: 41.1 %
Platelets: 200 10*3/uL (ref 140–400)
RBC: 5.3 10*6/uL (ref 4.20–5.80)
RDW: 12.9 % (ref 11.0–15.0)
Total Lymphocyte: 41.2 %
WBC: 6.6 10*3/uL (ref 3.8–10.8)

## 2021-11-08 LAB — COMPLETE METABOLIC PANEL WITH GFR
AG Ratio: 2 (calc) (ref 1.0–2.5)
ALT: 20 U/L (ref 9–46)
AST: 15 U/L (ref 10–40)
Albumin: 4.8 g/dL (ref 3.6–5.1)
Alkaline phosphatase (APISO): 51 U/L (ref 36–130)
BUN: 15 mg/dL (ref 7–25)
CO2: 26 mmol/L (ref 20–32)
Calcium: 9.8 mg/dL (ref 8.6–10.3)
Chloride: 104 mmol/L (ref 98–110)
Creat: 1.02 mg/dL (ref 0.60–1.26)
Globulin: 2.4 g/dL (calc) (ref 1.9–3.7)
Glucose, Bld: 68 mg/dL (ref 65–99)
Potassium: 4.1 mmol/L (ref 3.5–5.3)
Sodium: 139 mmol/L (ref 135–146)
Total Bilirubin: 0.6 mg/dL (ref 0.2–1.2)
Total Protein: 7.2 g/dL (ref 6.1–8.1)
eGFR: 100 mL/min/{1.73_m2} (ref >=60)

## 2021-11-08 LAB — QUANTIFERON-TB GOLD PLUS
Mitogen-NIL: >10 IU/mL
NIL: 0.02 IU/mL
QuantiFERON-TB Gold Plus: NEGATIVE
TB1-NIL: 0 IU/mL
TB2-NIL: <0 IU/mL

## 2021-11-09 NOTE — Progress Notes (Signed)
CBC, CMP and TB Gold are all within normal limits.

## 2021-11-10 ENCOUNTER — Ambulatory Visit (INDEPENDENT_AMBULATORY_CARE_PROVIDER_SITE_OTHER): Payer: 59 | Admitting: Psychiatry

## 2021-11-10 DIAGNOSIS — M47819 Spondylosis without myelopathy or radiculopathy, site unspecified: Secondary | ICD-10-CM

## 2021-11-10 DIAGNOSIS — F1291 Cannabis use, unspecified, in remission: Secondary | ICD-10-CM

## 2021-11-10 DIAGNOSIS — G4700 Insomnia, unspecified: Secondary | ICD-10-CM

## 2021-11-10 DIAGNOSIS — F411 Generalized anxiety disorder: Secondary | ICD-10-CM

## 2021-11-10 DIAGNOSIS — F250 Schizoaffective disorder, bipolar type: Secondary | ICD-10-CM

## 2021-11-10 DIAGNOSIS — F1091 Alcohol use, unspecified, in remission: Secondary | ICD-10-CM

## 2021-11-10 DIAGNOSIS — Z636 Dependent relative needing care at home: Secondary | ICD-10-CM
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
## 2021-11-18 NOTE — Telephone Encounter (Signed)
I called and left message telling him that the neck looks pretty reasonable except for some mild left C6 foraminal narrowing.  Bigger problem is likely cubital tunnel syndrome at the elbow.  I think is something that could potentially be addressed surgically if it is bothering him a lot.

## 2021-12-04 ENCOUNTER — Other Ambulatory Visit: Payer: Self-pay | Admitting: Rheumatology

## 2021-12-04 DIAGNOSIS — Z79899 Other long term (current) drug therapy: Secondary | ICD-10-CM

## 2021-12-04 DIAGNOSIS — M47819 Spondylosis without myelopathy or radiculopathy, site unspecified: Secondary | ICD-10-CM

## 2021-12-04 DIAGNOSIS — H209 Unspecified iridocyclitis: Secondary | ICD-10-CM

## 2021-12-04 NOTE — Telephone Encounter (Signed)
Next Visit: 04/06/2022  Last Visit: 11/04/2021  Last Fill: 08/13/2021  [id number]  Current Dose per office note 11/04/2021: Humira 40 mg sq injections every 14 days  Labs: 11/04/2021 CBC, CMP and TB Gold are all within normal limits.  TB Gold: 11/04/2021 Neg    Okay to refill Humira?

## 2021-12-07 ENCOUNTER — Telehealth: Payer: Self-pay | Admitting: Rheumatology

## 2021-12-07 NOTE — Telephone Encounter (Signed)
Patient called stating he lost his insurance ([employer]) and cannot afford his Humira injections which are approximately $6,000 per injection.  Patient states it is too expensive to get private insurance and has to wait until the Marketplace opens in November.  Patient states he is concerned that if he doesn't have his medication his symptoms will get worse and he may become "bed ridden".  Patient requested a return call.

## 2021-12-08 NOTE — Telephone Encounter (Signed)
Spoke with patient. He is currently uninsured. He requested [employer] patient assistance application be emailed to him. Patient advised to email or fax completed form back to pharmacy team for fastest turnaround time  Provider portion placed with Dr. Estanislado Pandy for signature.  Knox Saliva, PharmD, MPH, BCPS, CPP Clinical Pharmacist (Rheumatology and Pulmonology)

## 2021-12-10 NOTE — Telephone Encounter (Signed)
Provider portion of [employer] application received. Pending patient portion of application  Knox Saliva, PharmD, MPH, BCPS, CPP Clinical Pharmacist (Rheumatology and Pulmonology)

## 2021-12-14 NOTE — Telephone Encounter (Signed)
Signed patient portion received. Submitted Patient Assistance Application to [place] for [place] along with provider portion, patient portion, and med list. Will update patient when we receive a response.  Fax# [phone] Phone# [phone]  Knox Saliva, PharmD, MPH, BCPS, CPP Clinical Pharmacist (Rheumatology and Pulmonology)

## 2021-12-16 ENCOUNTER — Ambulatory Visit (INDEPENDENT_AMBULATORY_CARE_PROVIDER_SITE_OTHER): Payer: 59 | Admitting: Psychiatry

## 2021-12-16 DIAGNOSIS — F411 Generalized anxiety disorder: Secondary | ICD-10-CM

## 2021-12-16 DIAGNOSIS — F250 Schizoaffective disorder, bipolar type: Secondary | ICD-10-CM

## 2021-12-16 DIAGNOSIS — Z636 Dependent relative needing care at home: Secondary | ICD-10-CM

## 2021-12-16 DIAGNOSIS — Z638 Other specified problems related to primary support group: Secondary | ICD-10-CM
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
## 2021-12-23 NOTE — Telephone Encounter (Signed)
Received a fax from  [place] regarding an approval for [employer] patient assistance from 12/17/2021 to 12/18/2022.   Phone number: [phone]  ATC patient to review. Unable to reach. Left VM.  Knox Saliva, PharmD, MPH, BCPS, CPP Clinical Pharmacist (Rheumatology and Pulmonology)

## 2021-12-24 NOTE — Telephone Encounter (Signed)
ATC #2 patient regarding patient assistance approval. Unable to reach. Left VM with [employer] phone number and requested he reach out to them for shipment scheduling. Will ATC one more time next week prior to closing encounter  Knox Saliva, PharmD, MPH, BCPS, CPP Clinical Pharmacist (Rheumatology and Pulmonology)

## 2021-12-30 NOTE — Telephone Encounter (Signed)
ATC patient regarding [employer] patient assistance approval. Unable to reach. Left third VM. Will close f/u due to lack of response from pt  Knox Saliva, PharmD, MPH, BCPS, CPP Clinical Pharmacist (Rheumatology and Pulmonology)

## 2022-01-14 ENCOUNTER — Ambulatory Visit: Payer: 59 | Admitting: Psychiatry

## 2022-02-05 DIAGNOSIS — Z419 Encounter for procedure for purposes other than remedying health state, unspecified: Secondary | ICD-10-CM | POA: Diagnosis not present

## 2022-02-11 ENCOUNTER — Ambulatory Visit: Payer: 59 | Admitting: Psychiatry

## 2022-02-11 NOTE — Progress Notes (Signed)
Admin note for non-service contact  Patient ID: Leon Mason  MRN: [id number] DATE: 02/11/2022  NS for 4pm session.  PT has lengthy history of sporadic scheduling and either noshow or short-notice cancellation due to circumstances, and known difficulties organizing, within an enmeshed, multiple-disability household, and last known to be on a bankrupt health plan.  Given indigent status, I'm inclined not to charge for missed appt, but given chronicity of the problem, will charge reduced fee ($25, instead of $75).    Blanchie Serve, PhD [name], PhD LP Clinical Psychologist, [facility] Group [facility] [address], [place], [place] [address] [phone]

## 2022-02-11 NOTE — Progress Notes (Deleted)
Psychotherapy Progress Note [facility] [name], PhD LP  Patient ID: [name] [facility] "[name]")    MRN: [id number] Therapy format: {Therapy Types:21967::"Individual psychotherapy"} Date: 02/11/2022      Start: ***:***     Stop: ***:***     Time Spent: *** min Location: {SvcLoc:22530::"In-person"}   Session narrative (presenting needs, interim history, self-report of stressors and symptoms, applications of prior therapy, status changes, and interventions made in session) ***  Therapeutic modalities: {AM:23362::"Cognitive Behavioral Therapy","Solution-Oriented/Positive Psychology"}  Mental Status/Observations:  Appearance:   {PSY:22683}     Behavior:  {PSY:21022743}  Motor:  {PSY:22302}  Speech/Language:   {PSY:22685}  Affect:  {PSY:22687}  Mood:  {PSY:31886}  Thought process:  {PSY:31888}  Thought content:    {[phone]}  Sensory/Perceptual disturbances:    {PSY:312-872-9307}  Orientation:  {Psych Orientation:23301::"Fully oriented"}  Attention:  {Good-Fair-Poor ratings:23770::"Good"}    Concentration:  {Good-Fair-Poor ratings:23770::"Good"}  Memory:  {PSY:[phone]}  Insight:    {Good-Fair-Poor ratings:23770::"Good"}  Judgment:   {Good-Fair-Poor ratings:23770::"Good"}  Impulse Control:  {Good-Fair-Poor ratings:23770::"Good"}   Risk Assessment: Danger to Self: {Risk:22599::"No"} Self-injurious Behavior: {Risk:22599::"No"} Danger to Others: {Risk:22599::"No"} Physical Aggression / Violence: {Risk:22599::"No"} Duty to Warn: {AMYesNo:22526::"No"} Access to Firearms a concern: {AMYesNo:22526::"No"}  Assessment of progress:  {Progress:22147::"progressing"}  Diagnosis: No diagnosis found. Plan:  *** Other recommendations/advice as may be noted above Continue to utilize previously learned skills ad lib Maintain medication as prescribed and work faithfully with relevant prescriber(s) if any changes are desired or seem indicated Call the  clinic on-call service, 988/hotline, 911, or present to [facility] or ER if any life-threatening psychiatric crisis No follow-ups on file. Already scheduled visit in this office 04/22/2022.  Blanchie Serve, PhD [name], PhD LP Clinical Psychologist, [facility] Group [facility] [address], [place], [place] [address] [phone]

## 2022-02-13 DIAGNOSIS — R051 Acute cough: Secondary | ICD-10-CM | POA: Diagnosis not present

## 2022-02-13 DIAGNOSIS — J0100 Acute maxillary sinusitis, unspecified: Secondary | ICD-10-CM | POA: Diagnosis not present

## 2022-02-13 DIAGNOSIS — J9801 Acute bronchospasm: Secondary | ICD-10-CM | POA: Diagnosis not present

## 2022-02-18 ENCOUNTER — Telehealth: Payer: Self-pay | Admitting: *Deleted

## 2022-02-18 ENCOUNTER — Encounter: Payer: Self-pay | Admitting: *Deleted

## 2022-02-18 NOTE — Telephone Encounter (Signed)
Letter written for patient.

## 2022-02-18 NOTE — Telephone Encounter (Signed)
Spoke with patient and advise patient he will need to continue to hold his Humira until her receives clearance from PCP that his infection has cleared. Patient advised that we can provide a letter for today. Patient advised per Dr. Estanislado Pandy if he continues to have pain he will need to come in for an appointment. Patient requested to schedule an appointment. Offered appointment for today at 2:40 pm. Patient declined. Patient advised we do not have anything available at this time until after the first of the year. Patient will call back next week to see if we have had a cancellation.

## 2022-02-18 NOTE — Telephone Encounter (Signed)
OK to give the letter

## 2022-02-18 NOTE — Telephone Encounter (Signed)
Patient contacted the office stating that he was diagnosed with RSV and was placed on antibiotics. Patient states he has been off his Humira for about 3 weeks and he is having increased back pain. Patient states he has completed his antibiotics but still "has a lot of mucous in his lungs". Patient states he would like to know if he should take his Humira today. He is also requesting a doctor's note for work today. He states he attempted to return to work today but was unable to stay due to his back pain. Please advise.

## 2022-02-22 ENCOUNTER — Ambulatory Visit: Payer: Medicaid Other | Attending: Physician Assistant | Admitting: Physician Assistant

## 2022-02-22 ENCOUNTER — Encounter: Payer: Self-pay | Admitting: Physician Assistant

## 2022-02-22 VITALS — BP 122/78 | HR 72 | Resp 18 | Ht 74.0 in | Wt 298.6 lb

## 2022-02-22 DIAGNOSIS — F1020 Alcohol dependence, uncomplicated: Secondary | ICD-10-CM

## 2022-02-22 DIAGNOSIS — J4530 Mild persistent asthma, uncomplicated: Secondary | ICD-10-CM

## 2022-02-22 DIAGNOSIS — F250 Schizoaffective disorder, bipolar type: Secondary | ICD-10-CM

## 2022-02-22 DIAGNOSIS — G8929 Other chronic pain: Secondary | ICD-10-CM

## 2022-02-22 DIAGNOSIS — Z1589 Genetic susceptibility to other disease: Secondary | ICD-10-CM

## 2022-02-22 DIAGNOSIS — R002 Palpitations: Secondary | ICD-10-CM

## 2022-02-22 DIAGNOSIS — M533 Sacrococcygeal disorders, not elsewhere classified: Secondary | ICD-10-CM | POA: Diagnosis not present

## 2022-02-22 DIAGNOSIS — R059 Cough, unspecified: Secondary | ICD-10-CM | POA: Diagnosis not present

## 2022-02-22 DIAGNOSIS — M546 Pain in thoracic spine: Secondary | ICD-10-CM | POA: Diagnosis not present

## 2022-02-22 DIAGNOSIS — Z79899 Other long term (current) drug therapy: Secondary | ICD-10-CM

## 2022-02-22 DIAGNOSIS — M47819 Spondylosis without myelopathy or radiculopathy, site unspecified: Secondary | ICD-10-CM

## 2022-02-22 DIAGNOSIS — H209 Unspecified iridocyclitis: Secondary | ICD-10-CM

## 2022-02-22 DIAGNOSIS — L500 Allergic urticaria: Secondary | ICD-10-CM

## 2022-02-22 DIAGNOSIS — R9431 Abnormal electrocardiogram [ECG] [EKG]: Secondary | ICD-10-CM

## 2022-02-22 DIAGNOSIS — G5622 Lesion of ulnar nerve, left upper limb: Secondary | ICD-10-CM | POA: Diagnosis not present

## 2022-02-22 DIAGNOSIS — J329 Chronic sinusitis, unspecified: Secondary | ICD-10-CM | POA: Diagnosis not present

## 2022-02-22 DIAGNOSIS — M25562 Pain in left knee: Secondary | ICD-10-CM | POA: Diagnosis not present

## 2022-02-22 DIAGNOSIS — M5030 Other cervical disc degeneration, unspecified cervical region: Secondary | ICD-10-CM | POA: Diagnosis not present

## 2022-02-22 DIAGNOSIS — R5383 Other fatigue: Secondary | ICD-10-CM

## 2022-02-22 DIAGNOSIS — F17200 Nicotine dependence, unspecified, uncomplicated: Secondary | ICD-10-CM

## 2022-02-22 DIAGNOSIS — R6510 Systemic inflammatory response syndrome (SIRS) of non-infectious origin without acute organ dysfunction: Secondary | ICD-10-CM

## 2022-02-22 MED ORDER — HUMIRA (2 PEN) 40 MG/0.4ML ~~LOC~~ AJKT: AUTO-INJECTOR | SUBCUTANEOUS | 0 refills | Status: DC

## 2022-02-22 MED ORDER — PREDNISONE 5 MG PO TABS: ORAL_TABLET | ORAL | 0 refills | Status: DC

## 2022-02-22 NOTE — Progress Notes (Signed)
Office Visit Note  Patient: Leon Mason             Date of Birth: January 03, 1991           MRN: [id number]             PCP: Chevis Pretty, FNP Referring: Chevis Pretty, * Visit Date: 02/22/2022 Occupation: _0 @  Subjective:  Thoracic spinal pain   History of Present Illness: GLYN GERADS is a 31 y.o. male with history of spondylopathy and iritis.  Patient is currently prescribed Humira 40 mg sq injections every 14 days.  His last dose of humira was administered 4 weeks ago.  He states he recently had to hold his dose of humira due to contracting RSV.  He states he has been out of work since 02/18/22 but his symptoms have resolved.  He was reevaluated today at urgent care since he is asymptomatic and wanted to be retested for RSV in order to restart on humira. He states that he has started to have a flare while off of humira.  He is currently having pain and stiffness in the thoracic spine.  He has been taking ibuprofen for pain relief.    Activities of Daily Living:  Patient reports morning stiffness for 30 minutes.   Patient Reports nocturnal pain.  Difficulty dressing/grooming: Denies Difficulty climbing stairs: Reports Difficulty getting out of chair: Reports Difficulty using hands for taps, buttons, cutlery, and/or writing: Reports  Review of Systems  Constitutional:  Positive for fatigue.  HENT:  Negative for mouth sores and mouth dryness.   Eyes:  Negative for dryness.  Respiratory:  Positive for shortness of breath.   Cardiovascular:  Positive for chest pain. Negative for palpitations.  Gastrointestinal:  Negative for blood in stool, constipation and diarrhea.  Endocrine: Negative for increased urination.  Genitourinary:  Negative for involuntary urination.  Musculoskeletal:  Positive for joint pain, joint pain, joint swelling, myalgias, muscle weakness, morning stiffness, muscle tenderness and myalgias. Negative for gait problem.  Skin:  Negative  for color change, rash, hair loss and sensitivity to sunlight.  Allergic/Immunologic: Negative for susceptible to infections.  Neurological:  Negative for dizziness and headaches.  Hematological:  Negative for swollen glands.  Psychiatric/Behavioral:  Negative for depressed mood and sleep disturbance. The patient is not nervous/anxious.     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   Back pain without sciatica 02/16/2021   Neck pain 02/16/2021   Generalized joint pain 10/21/2020   Generalized abdominal pain 10/21/2020   Mild intermittent asthma with (acute) exacerbation 02/18/2019   Hypokalemia 02/18/2019   Normocytic anemia 02/18/2019   Elevated transaminase level 02/18/2019   Prolonged QT interval 02/18/2019   Viral syndrome 02/18/2019   SIRS (systemic inflammatory response syndrome) ([place]) 02/18/2019   Globus sensation 07/04/2018   Schizophrenia ([place]) 03/28/2018   MDD (major depressive disorder), recurrent episode, severe ([place]) 03/28/2018   Schizo-affective schizophrenia, chronic condition with acute exacerbation ([place]) 03/28/2018   Tobacco use disorder 12/17/2016   Schizoaffective disorder, bipolar type ([place]) 08/23/2016   Alcohol use disorder, severe, dependence ([place]) 08/23/2016   Allergic rhinitis due to pollen 10/09/2015   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   Mild persistent allergic asthma 10/09/2015   Psychosis ([place]) 06/14/2013    Past Medical History:  Diagnosis Date   Anxiety    Asthma    Bipolar 1 disorder ([place])    Hallucinations    Schizoaffective disorder ([place])    Schizophrenia (  [place])     Family History  Problem Relation Age of Onset   Bipolar disorder Mother    Alcoholism Mother    Diabetes Mother    Cervical cancer Mother    OCD Father    Bipolar disorder Father    Eczema Brother    Post-traumatic stress disorder Brother    COPD Maternal Aunt    Heart attack Maternal Grandmother    Breast cancer  Maternal Grandmother    Heart attack Maternal Grandfather    Cancer Paternal Grandmother    Heart attack Paternal Grandfather    Asthma Neg Hx    Allergic rhinitis Neg Hx    Angioedema Neg Hx    Urticaria Neg Hx    Immunodeficiency Neg Hx    Atopy Neg Hx    Prostate cancer Neg Hx    Colon cancer Neg Hx    Past Surgical History:  Procedure Laterality Date   WISDOM TOOTH EXTRACTION  2009   all four   Social History   Social History Narrative   Not on file   Immunization History  Administered Date(s) Administered   DTaP 01/03/1990, 03/17/1990, 06/08/1990, 02/20/1991, 06/01/1994   HIB (PRP-OMP) 01/03/1990, 03/17/1990, 06/08/1990, 02/20/1991   Hepatitis B 01/05/2001, 02/01/2001, 05/04/2005   Hepatitis B, PED/ADOLESCENT 01/05/2001, 02/01/2001, 05/04/2005   IPV 01/03/1990, 03/17/1990, 02/20/1991, 06/01/1994   Influenza,inj,Quad PF,6+ Mos 02/20/2019, 02/16/2021   Influenza,inj,quad, With Preservative 03/18/2018   Influenza-Unspecified 03/18/2018   MMR 02/20/1991, 06/01/1994   Meningococcal Conjugate 05/04/2005   Pneumococcal Polysaccharide-23 08/29/2016, 02/20/2019   Tdap 05/04/2005     Objective: Vital Signs: BP 122/78 (BP Location: Left Arm, Patient Position: Sitting, Cuff Size: Large)   Pulse 72   Resp 18   Ht _0  (1.88 m)   Wt 298 lb 9.6 oz (135.4 kg)   BMI 38.34 kg/m    Physical Exam Vitals and nursing note reviewed.  Constitutional:      Appearance: He is well-developed.  HENT:     Head: Normocephalic and atraumatic.  Eyes:     Conjunctiva/sclera: Conjunctivae normal.     Pupils: Pupils are equal, round, and reactive to light.  Cardiovascular:     Rate and Rhythm: Normal rate and regular rhythm.     Heart sounds: Normal heart sounds.  Pulmonary:     Effort: Pulmonary effort is normal.     Breath sounds: Wheezing present.  Abdominal:     General: Bowel sounds are normal.     Palpations: Abdomen is soft.  Musculoskeletal:     Cervical back: Normal  range of motion and neck supple.  Skin:    General: Skin is warm and dry.     Capillary Refill: Capillary refill takes less than 2 seconds.  Neurological:     Mental Status: He is alert and oriented to person, place, and time.  Psychiatric:        Behavior: Behavior normal.      Musculoskeletal Exam: C-spine has painful range of motion.  Midline spinal tenderness in the thoracic region.  No SI joint tenderness upon palpation.  Shoulder joints, elbow joints, wrist joints, MCPs, PIPs, DIPs have good range of motion with no synovitis.  Complete fist formation bilaterally.  Hip joints have good range of motion with no groin pain.  Knee joints have good range of motion with no warmth or effusion.  Ankle joints have good range of motion with no tenderness or joint swelling.  No evidence of Achilles tendinitis or plantar fasciitis.  CDAI  Exam: CDAI Score: -- Patient Global: --; Provider Global: -- Swollen: --; Tender: -- Joint Exam 02/22/2022   No joint exam has been documented for this visit   There is currently no information documented on the homunculus. Go to the Rheumatology activity and complete the homunculus joint exam.  Investigation: No additional findings.  Imaging: No results found.  Recent Labs: Lab Results  Component Value Date   WBC 6.6 11/04/2021   HGB 16.2 11/04/2021   PLT 200 11/04/2021   NA 139 11/04/2021   K 4.1 11/04/2021   CL 104 11/04/2021   CO2 26 11/04/2021   GLUCOSE 68 11/04/2021   BUN 15 11/04/2021   CREATININE 1.02 11/04/2021   BILITOT 0.6 11/04/2021   ALKPHOS 56 05/06/2021   AST 15 11/04/2021   ALT 20 11/04/2021   PROT 7.2 11/04/2021   ALBUMIN 5.0 05/06/2021   CALCIUM 9.8 11/04/2021   GFRAA >60 02/19/2019   QFTBGOLDPLUS NEGATIVE 11/04/2021    Speciality Comments: Humira started December 26, 2020  Procedures:  No procedures performed Allergies: Other, Dust mite extract, and Pollen extract     Assessment / Plan:     Visit Diagnoses:  Spondyloarthropathy - Inflammatory arthritis, iritis, chronic SI joint pain, chronic pain in neck, thoracic and lumbar region.  HLA-B27 positive: He has no synovitis or dactylitis on examination.  No signs or symptoms of iritis recently.  No SI joint tenderness upon palpation.  Patient presents today experiencing a flare in the thoracic spine.  He is currently experiencing midline spinal tenderness as well as increased joint stiffness and nocturnal pain.  His last dose of Humira was administered 4 weeks ago.  He was recently diagnosed with RSV and has been out of work since 02/18/2022.  He followed back up at urgent care today to be retested for RSV prior to resuming Humira.  His symptoms have improved significantly.  On examination wheezing was still auscultated.  Patient was advised to continue to hold Humira until his cough and wheezing have completely resolved.  He voiced understanding.  A prednisone taper starting at 20 mg tapering by 5 mg every 2 days was sent to the pharmacy to treat his current flare.  Instructions were provided including the importance of avoiding NSAID use.  He was advised to notify us if his symptoms persist or worsen.  He will follow up in 5 months or sooner if needed. - Plan: Adalimumab (HUMIRA PEN) 40 MG/0.4ML PNKT  High risk medication use - Humira 40 mg sq injections every 14 days-initiated Humira on 12/26/20.  Plan: COMPLETE METABOLIC PANEL WITH GFR, CBC with Differential/Platelet, Adalimumab (HUMIRA PEN) 40 MG/0.4ML PNKT CBC and CMP updated on 11/04/21. Orders for CBC and CMP released today.  His next lab work will be due in March and every 3 months.  TB gold negative on 11/04/21.  He has missed 1 dose of humira due to being diagnosed with RSV.  He was reevaluated at urgent care today and had a repeat RSV screening.  He is currently asymptomatic.  On exam he still has some wheezing.  He was encouraged to continue to hold Humira until his cough and wheezing has completely  resolved.   Discussed the importance of always holding humira if he develops signs or symptoms of an infection and to resume once the infection has completely cleared.   Encouraged patient to start vitamin C and zinc supplements to boost his immune system.   HLA B27 positive  Iritis - He has not had  any signs or symptoms of an Iritis flare. Plan: Adalimumab (HUMIRA PEN) 40 MG/0.4ML PNKT  Entrapment of left ulnar nerve - Evaluated by Dr. Marlou Sa. NCV with EMG on 10/13/21.  Patient plans on following up with Dr. Marlou Sa since he remains symptomatic.   Chronic pain of left knee: No warmth or effusion noted today.   DDD (degenerative disc disease), cervical - MRI of the cervical spine on September 17, 2021.  C-spine has painful ROM but no radiculopathy at this time.   Pain in thoracic spine: He presents today with increased pain and stiffness in the thoracic spine.  Overall his symptoms are well-controlled while he was on Humira consistently.  Due to the gap in therapy has had an exacerbation of symptoms.  His pain was severe when he tried to return to work to the point he had difficulty performing his responsibilities.  A prednisone taper starting at 20 mg tapering by 5 mg every 2 days was sent to the pharmacy.  A work note was also provided to the patient.  He plans on returning to work tomorrow.  Chronic SI joint pain: No SI joint tenderness upon palpation today.   Other fatigue: Exacerbated by recent RSV infection.   Other medical conditions are listed as follows:   Allergic urticaria  Schizoaffective disorder, bipolar type (HCC)  SIRS (systemic inflammatory response syndrome) (HCC)  Prolonged QT interval  Palpitations  Mild persistent allergic asthma  Alcohol use disorder, severe, dependence ([place])  Smoker    Orders: Orders Placed This Encounter  Procedures   COMPLETE METABOLIC PANEL WITH GFR   CBC with Differential/Platelet   Meds ordered this encounter  Medications   Adalimumab  (HUMIRA PEN) 40 MG/0.4ML PNKT    Sig: INJECT 1 PEN SUBCUTANEOUSLY ONCE EVERY 14 DAYS    Dispense:  6 each    Refill:  0   predniSONE (DELTASONE) 5 MG tablet    Sig: Take 4 tablets by mouth daily x2 days, 3 tablets daily x2 days, 2 tablets daily x2 days, 1 tablet daily x2 days.    Dispense:  20 tablet    Refill:  0     Follow-Up Instructions: Return in about 5 months (around 07/24/2022) for Spondyloarthropathy, iritis.   Ofilia Neas, PA-C  Note - This record has been created using Dragon software.  Chart creation errors have been sought, but may not always  have been located. Such creation errors do not reflect on  the standard of medical care.

## 2022-02-22 NOTE — Patient Instructions (Signed)
Standing Labs We placed an order today for your standing lab work.   Please have your standing labs drawn in March and every 3 months  Please have your labs drawn 2 weeks prior to your appointment so that the provider can discuss your lab results at your appointment.  Please note that you may see your imaging and lab results in MyChart before we have reviewed them. We will contact you once all results are reviewed. Please allow our office up to 72 hours to thoroughly review all of the results before contacting the office for clarification of your results.  Lab hours are:   Monday through Thursday from 8:00 am -12:30 pm and 1:00 pm-5:00 pm and Friday from 8:00 am-12:00 pm.  Please be advised, all patients with office appointments requiring lab work will take precedent over walk-in lab work.   Labs are drawn by [facility]. Please bring your co-pay at the time of your lab draw.  You may receive a bill from [facility] for your lab work.  Please note if you are on Hydroxychloroquine and and an order has been placed for a Hydroxychloroquine level, you will need to have it drawn 4 hours or more after your last dose.  If you wish to have your labs drawn at another location, please call the office 24 hours in advance so we can fax the orders.  The office is located at [address], [address], [place], [place] [address] No appointment is necessary.    If you have any questions regarding directions or hours of operation,  please call [phone].   As a reminder, please drink plenty of water prior to coming for your lab work. Thanks!  

## 2022-02-23 LAB — CBC WITH DIFFERENTIAL/PLATELET
Absolute Monocytes: 419 cells/uL (ref 200–950)
Basophils Absolute: 71 cells/uL (ref 0–200)
Basophils Relative: 0.9 %
Eosinophils Absolute: 47 cells/uL (ref 15–500)
Eosinophils Relative: 0.6 %
HCT: 45.6 % (ref 38.5–50.0)
Hemoglobin: 15.5 g/dL (ref 13.2–17.1)
Lymphs Abs: 2291 cells/uL (ref 850–3900)
MCH: 30.5 pg (ref 27.0–33.0)
MCHC: 34 g/dL (ref 32.0–36.0)
MCV: 89.8 fL (ref 80.0–100.0)
MPV: 11.5 fL (ref 7.5–12.5)
Monocytes Relative: 5.3 %
Neutro Abs: 5072 cells/uL (ref 1500–7800)
Neutrophils Relative %: 64.2 %
Platelets: 309 10*3/uL (ref 140–400)
RBC: 5.08 10*6/uL (ref 4.20–5.80)
RDW: 13.2 % (ref 11.0–15.0)
Total Lymphocyte: 29 %
WBC: 7.9 10*3/uL (ref 3.8–10.8)

## 2022-02-23 LAB — COMPLETE METABOLIC PANEL WITH GFR
AG Ratio: 1.4 (calc) (ref 1.0–2.5)
ALT: 25 U/L (ref 9–46)
AST: 19 U/L (ref 10–40)
Albumin: 4.7 g/dL (ref 3.6–5.1)
Alkaline phosphatase (APISO): 61 U/L (ref 36–130)
BUN: 13 mg/dL (ref 7–25)
CO2: 26 mmol/L (ref 20–32)
Calcium: 10.2 mg/dL (ref 8.6–10.3)
Chloride: 103 mmol/L (ref 98–110)
Creat: 0.88 mg/dL (ref 0.60–1.26)
Globulin: 3.3 g/dL (calc) (ref 1.9–3.7)
Glucose, Bld: 96 mg/dL (ref 65–99)
Potassium: 4.5 mmol/L (ref 3.5–5.3)
Sodium: 139 mmol/L (ref 135–146)
Total Bilirubin: 0.4 mg/dL (ref 0.2–1.2)
Total Protein: 8 g/dL (ref 6.1–8.1)
eGFR: 117 mL/min/{1.73_m2} (ref >=60)

## 2022-02-23 NOTE — Progress Notes (Signed)
CBC and CMP WNL

## 2022-03-08 DIAGNOSIS — Z419 Encounter for procedure for purposes other than remedying health state, unspecified: Secondary | ICD-10-CM | POA: Diagnosis not present

## 2022-04-06 ENCOUNTER — Ambulatory Visit: Payer: 59 | Admitting: Physician Assistant

## 2022-04-08 DIAGNOSIS — Z419 Encounter for procedure for purposes other than remedying health state, unspecified: Secondary | ICD-10-CM | POA: Diagnosis not present

## 2022-04-22 ENCOUNTER — Encounter: Payer: Self-pay | Admitting: Adult Health

## 2022-04-22 ENCOUNTER — Ambulatory Visit (INDEPENDENT_AMBULATORY_CARE_PROVIDER_SITE_OTHER): Payer: Medicaid Other | Admitting: Adult Health

## 2022-04-22 DIAGNOSIS — F250 Schizoaffective disorder, bipolar type: Secondary | ICD-10-CM | POA: Diagnosis not present

## 2022-04-22 DIAGNOSIS — R9431 Abnormal electrocardiogram [ECG] [EKG]: Secondary | ICD-10-CM

## 2022-04-22 DIAGNOSIS — G4700 Insomnia, unspecified: Secondary | ICD-10-CM

## 2022-04-22 DIAGNOSIS — F331 Major depressive disorder, recurrent, moderate: Secondary | ICD-10-CM | POA: Diagnosis not present

## 2022-04-22 DIAGNOSIS — F411 Generalized anxiety disorder: Secondary | ICD-10-CM

## 2022-04-22 MED ORDER — SERTRALINE HCL 50 MG PO TABS: 50.0000 mg | ORAL_TABLET | Freq: Every day | ORAL | 3 refills | Status: DC

## 2022-04-22 MED ORDER — PERPHENAZINE 4 MG PO TABS: 4.0000 mg | ORAL_TABLET | Freq: Two times a day (BID) | ORAL | 3 refills | Status: DC

## 2022-04-22 NOTE — Progress Notes (Signed)
YASMANI GADWAY XF:[id number] 1990-12-21 32 y.o.  Subjective:   Patient ID:  Leon Mason is a 32 y.o. (DOB 10/30/1990) male.  Chief Complaint: No chief complaint on file.   HPI Lugene Kielbasa Azpeitia presents to the office today for follow-up of MDD, GAD, insomnia, Schizophrenia.  Describes mood today as "ok". Pleasant. Mood symptoms - reports some depression, anxiety, and irritability - more situational stressors. Reports decreased, worry, and over thinking - "trying to leave things alone and move on". Mood has been lower. Stating "I feel like the medications are helpful". Taking medications as prescribed. Lives at home with mother and brother. Decreased interest and motivation. Taking medications as prescribed. Energy levels stable. Active, does not have a regular exercise routine.  Enjoys some usual interests and activities. Single. Lives with mother (disabled) - brother 3 cats and a dog. Spending time with family - Aunt and uncle local.  Appetite adequate. Weight gain 275 to 285 pounds - using food to cope with stress. Sleeping well most nights. Averages 8 hours. Focus and concentration stable. Completing tasks. Managing aspects of household. Currently unemployed. Denies SI or HI.  Denies AH or VH. Reports paranoid thoughts. Denies self harm. 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    Seeing [name] for therapy.  Previous medications: Perphenazine, Lithium, Sertraline, Haldol, Zyprexa, Risperdal, stopped due to various side effects, ie biting tongue a lot, restless legs, weight gain    [address] Office Visit from 06/15/2021 in [facility] Office Visit from 05/29/2021 in [facility] Office Visit from 02/16/2021 in [facility] Office Visit from 10/21/2020 in [facility]  Total GAD-7 Score 8 8 16 13      $ PHQ2-9    [place]  Office Visit from 06/15/2021 in [facility] Office Visit from 05/29/2021 in [facility] Office Visit from 02/16/2021 in [facility] Office Visit from 10/21/2020 in [facility] Office Visit from 12/10/2017 in [facility]  PHQ-2 Total Score 3 2 3 6 3  $ PHQ-9 Total Score 6 7 7 17 8      $ Flowsheet Row ED from 08/25/2020 in [facility] ED from 08/02/2020 in [facility] ED from 06/24/2020 in [facility] [place] No Risk No Risk No Risk        Review of Systems:  Review of Systems  Musculoskeletal:  Negative for gait problem.  Neurological:  Negative for tremors.  Psychiatric/Behavioral:         Please refer to HPI    Medications: I have reviewed the patient's current medications.  Current Outpatient Medications  Medication Sig Dispense Refill   acetaminophen (TYLENOL) 500 MG tablet Take 1 tablet (500 mg total) by mouth every 6 (six) hours as needed. 30 tablet 0   Adalimumab (HUMIRA PEN) 40 MG/0.4ML PNKT INJECT 1 PEN SUBCUTANEOUSLY ONCE EVERY 14 DAYS 6 each 0   diclofenac Sodium (VOLTAREN) 1 % GEL Apply 2 g topically 4 (four) times daily. 100 g 1   IBUPROFEN PO Take by mouth as needed.     imiquimod (ALDARA) 5 % cream Apply a thin layer 3 times per week (on alternate days) prior to bedtime; leave on skin for 6 to 10  hours, then remove with mild soap and water.  Max 16 weeks (Patient not taking: Reported on 08/04/2021) 12 each 3   levocetirizine (XYZAL) 5 MG tablet Take 1 tablet (5 mg total) by mouth every evening. For allergies. Use instead of Claritin 90 tablet 3   Multiple Vitamin (MULTIVITAMIN PO) Take by mouth daily.     Omega-3 Fatty Acids (FISH OIL PO) Take by mouth daily.     perphenazine (TRILAFON) 4 MG tablet Take 1 tablet (4 mg  total) by mouth 2 (two) times daily. 180 tablet 3   predniSONE (DELTASONE) 5 MG tablet Take 4 tablets by mouth daily x2 days, 3 tablets daily x2 days, 2 tablets daily x2 days, 1 tablet daily x2 days. 20 tablet 0   sertraline (ZOLOFT) 50 MG tablet Take 1 tablet (50 mg total) by mouth daily. 90 tablet 3   No current facility-administered medications for this visit.    Medication Side Effects: None  Allergies:  Allergies  Allergen Reactions   Other Hives and Shortness Of Breath    Dogs and Cats.   Dust Mite Extract    Pollen Extract     Past Medical History:  Diagnosis Date   Anxiety    Asthma    Bipolar 1 disorder ([place])    Hallucinations    Schizoaffective disorder (HCC)    Schizophrenia (HCC)     Past Medical History, Surgical history, Social history, and Family history were reviewed and updated as appropriate.   Please see review of systems for further details on the patient's review from today.   Objective:   Physical Exam:  There were no vitals taken for this visit.  Physical Exam Constitutional:      General: He is not in acute distress. Musculoskeletal:        General: No deformity.  Neurological:     Mental Status: He is alert and oriented to person, place, and time.     Coordination: Coordination normal.  Psychiatric:        Attention and Perception: Attention and perception normal. He does not perceive auditory or visual hallucinations.        Mood and Affect: Mood normal. Mood is not anxious or depressed. Affect is not labile, blunt, angry or inappropriate.        Speech: Speech normal.        Behavior: Behavior normal.        Thought Content: Thought content normal. Thought content is not paranoid or delusional. Thought content does not include homicidal or suicidal ideation. Thought content does not include homicidal or suicidal plan.        Cognition and Memory: Cognition and memory normal.        Judgment: Judgment normal.     Comments: Insight intact      Lab Review:     Component Value Date/Time   NA 139 02/22/2022 1527   NA 138 05/06/2021 1357   K 4.5 02/22/2022 1527   CL 103 02/22/2022 1527   CO2 26 02/22/2022 1527   GLUCOSE 96 02/22/2022 1527   BUN 13 02/22/2022 1527   BUN 7 05/06/2021 1357   CREATININE 0.88 02/22/2022 1527   CALCIUM 10.2 02/22/2022 1527   PROT 8.0 02/22/2022 1527   PROT 7.7 05/06/2021 1357   ALBUMIN 5.0 05/06/2021 1357   AST 19 02/22/2022 1527   ALT 25 02/22/2022 1527   ALKPHOS 56 05/06/2021 1357   BILITOT 0.4 02/22/2022 1527   BILITOT 0.5 05/06/2021 1357  GFRNONAA >60 02/19/2019 0602   GFRAA >60 02/19/2019 0602       Component Value Date/Time   WBC 7.9 02/22/2022 1527   RBC 5.08 02/22/2022 1527   HGB 15.5 02/22/2022 1527   HGB 16.4 05/06/2021 1357   HCT 45.6 02/22/2022 1527   HCT 48.1 05/06/2021 1357   PLT 309 02/22/2022 1527   PLT 187 05/06/2021 1357   MCV 89.8 02/22/2022 1527   MCV 90 05/06/2021 1357   MCH 30.5 02/22/2022 1527   MCHC 34.0 02/22/2022 1527   RDW 13.2 02/22/2022 1527   RDW 12.8 05/06/2021 1357   LYMPHSABS 2,291 02/22/2022 1527   MONOABS 0.4 02/19/2019 0602   EOSABS 47 02/22/2022 1527   BASOSABS 71 02/22/2022 1527    Lithium Lvl  Date Value Ref Range Status  03/30/2018 <0.06 (L) 0.60 - 1.20 mmol/L Final    Comment:    REPEATED TO VERIFY Performed at [facility], [place] [address], [place], [place] [address]      No results found for: "PHENYTOIN", "PHENOBARB", "VALPROATE", "CBMZ"   .res Assessment: Plan:    Plan:  PDMP reviewed  Zoloft 63m  Perphenazine 2560mTID to 60m85mt at hs.  Restart therapy - Andy Mitchum.  Order EKG - prolonged QT interval - last EKG 05/2021  RTC 6 months  Patient advised to contact office with any questions, adverse effects, or acute worsening in signs and symptoms.  Discussed potential metabolic side effects associated with atypical antipsychotics, as well as potential risk for movement side effects.  Advised pt to contact office if movement side effects occur.    Diagnoses and all orders for this visit:  Schizoaffective disorder, bipolar type (HCC) -     perphenazine (TRILAFON) 4 MG tablet; Take 1 tablet (4 mg total) by mouth 2 (two) times daily.  Major depressive disorder, recurrent episode, moderate (HCC) -     sertraline (ZOLOFT) 50 MG tablet; Take 1 tablet (50 mg total) by mouth daily.  Generalized anxiety disorder -     sertraline (ZOLOFT) 50 MG tablet; Take 1 tablet (50 mg total) by mouth daily.  Insomnia, unspecified type -     sertraline (ZOLOFT) 50 MG tablet; Take 1 tablet (50 mg total) by mouth daily.  Prolonged QT interval -     EKG 12-Lead     Please see After Visit Summary for patient specific instructions.  Future Appointments  Date Time Provider [place]/20/2024  9:00 AM DalOfilia NeasA-C [facility] None    Orders Placed This Encounter  Procedures   EKG 12-Lead    -------------------------------

## 2022-05-07 DIAGNOSIS — Z419 Encounter for procedure for purposes other than remedying health state, unspecified: Secondary | ICD-10-CM | POA: Diagnosis not present

## 2022-05-12 DIAGNOSIS — J029 Acute pharyngitis, unspecified: Secondary | ICD-10-CM | POA: Diagnosis not present

## 2022-05-12 DIAGNOSIS — U071 COVID-19: Secondary | ICD-10-CM | POA: Diagnosis not present

## 2022-05-17 ENCOUNTER — Other Ambulatory Visit: Payer: Self-pay | Admitting: *Deleted

## 2022-05-17 DIAGNOSIS — H209 Unspecified iridocyclitis: Secondary | ICD-10-CM

## 2022-05-17 DIAGNOSIS — M47819 Spondylosis without myelopathy or radiculopathy, site unspecified: Secondary | ICD-10-CM

## 2022-05-17 DIAGNOSIS — Z79899 Other long term (current) drug therapy: Secondary | ICD-10-CM

## 2022-05-17 MED ORDER — HUMIRA (2 PEN) 40 MG/0.4ML ~~LOC~~ AJKT: AUTO-INJECTOR | SUBCUTANEOUS | 0 refills | Status: DC

## 2022-05-17 NOTE — Telephone Encounter (Signed)
Next Visit: 07/26/2022  Last Visit: 02/22/2022  Last Fill: 02/22/2022  [id number]   Current Dose per office note 02/22/2022: Humira 40 mg sq injections every 14 days   Labs: 02/22/2022 CBC and CMP WNL   TB Gold: 11/04/2021 negative   Okay to refill Humira?

## 2022-07-21 ENCOUNTER — Other Ambulatory Visit: Payer: Self-pay

## 2022-07-21 MED ORDER — ALBUTEROL SULFATE HFA 108 (90 BASE) MCG/ACT IN AERS: 2.0000 | INHALATION_SPRAY | Freq: Four times a day (QID) | RESPIRATORY_TRACT | 0 refills | Status: DC | PRN

## 2022-07-21 NOTE — Telephone Encounter (Signed)
Pt came in to the office today believing he had an appt. His appt is actually 7/17.  He states that he needs his albuterol refilled.  Last appt was in March of 2023.  Alright to refill Albuterol (Proventil 108 mcg- 90 base) until appt on 7/17?  QT interval prolongation warning while entering Rx

## 2022-07-22 NOTE — Progress Notes (Deleted)
Office Visit Note  Patient: Leon Mason             Date of Birth: 04/25/1990           MRN: [id number]             PCP: Raliegh Ip, DO Referring: Bennie Pierini, * Visit Date: 07/26/2022 Occupation: @GUAROCC @  Subjective:    History of Present Illness: Leon Mason is a 32 y.o. male with history of spondyloarthropathy.  He remains on humira 40 mg sq injections every 14 days.   CBC and CMP drawn on 02/22/22.  Orders for CBC and CMP released today.  His next lab work will be due in August and every 3 months.  TB gold negative on 10/08/21. Future order for TB gold placed today.  Discussed the importance of holding humira if he develops signs or symptoms of an infection and to resume once the infection has completely cleared.     Activities of Daily Living:  Patient reports morning stiffness for *** {minute/hour:19697}.   Patient {ACTIONS;DENIES/REPORTS:21021675::"Denies"} nocturnal pain.  Difficulty dressing/grooming: {ACTIONS;DENIES/REPORTS:21021675::"Denies"} Difficulty climbing stairs: {ACTIONS;DENIES/REPORTS:21021675::"Denies"} Difficulty getting out of chair: {ACTIONS;DENIES/REPORTS:21021675::"Denies"} Difficulty using hands for taps, buttons, cutlery, and/or writing: {ACTIONS;DENIES/REPORTS:21021675::"Denies"}  No Rheumatology ROS completed.   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   Back pain without sciatica 02/16/2021   Neck pain 02/16/2021   Generalized joint pain 10/21/2020   Generalized abdominal pain 10/21/2020   Mild intermittent asthma with (acute) exacerbation 02/18/2019   Hypokalemia 02/18/2019   Normocytic anemia 02/18/2019   Elevated transaminase level 02/18/2019   Prolonged QT interval 02/18/2019   Viral syndrome 02/18/2019   SIRS (systemic inflammatory response syndrome) (HCC) 02/18/2019   Globus sensation 07/04/2018   Schizophrenia (HCC) 03/28/2018   MDD (major  depressive disorder), recurrent episode, severe (HCC) 03/28/2018   Schizo-affective schizophrenia, chronic condition with acute exacerbation (HCC) 03/28/2018   Tobacco use disorder 12/17/2016   Schizoaffective disorder, bipolar type (HCC) 08/23/2016   Alcohol use disorder, severe, dependence (HCC) 08/23/2016   Allergic rhinitis due to pollen 10/09/2015   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   Mild persistent allergic asthma 10/09/2015   Psychosis (HCC) 06/14/2013    Past Medical History:  Diagnosis Date   Anxiety    Asthma    Bipolar 1 disorder (HCC)    Hallucinations    Schizoaffective disorder (HCC)    Schizophrenia (HCC)     Family History  Problem Relation Age of Onset   Bipolar disorder Mother    Alcoholism Mother    Diabetes Mother    Cervical cancer Mother    OCD Father    Bipolar disorder Father    Eczema Brother    Post-traumatic stress disorder Brother    COPD Maternal Aunt    Heart attack Maternal Grandmother    Breast cancer Maternal Grandmother    Heart attack Maternal Grandfather    Cancer Paternal Grandmother    Heart attack Paternal Grandfather    Asthma Neg Hx    Allergic rhinitis Neg Hx    Angioedema Neg Hx    Urticaria Neg Hx    Immunodeficiency Neg Hx    Atopy Neg Hx    Prostate cancer Neg Hx    Colon cancer Neg Hx    Past Surgical History:  Procedure Laterality Date   WISDOM TOOTH EXTRACTION  2009   all four   Social History   Social History Narrative   Not  on file   Immunization History  Administered Date(s) Administered   DTaP 01/03/1990, 03/17/1990, 06/08/1990, 02/20/1991, 06/01/1994   HIB (PRP-OMP) 01/03/1990, 03/17/1990, 06/08/1990, 02/20/1991   Hepatitis B 01/05/2001, 02/01/2001, 05/04/2005   Hepatitis B, PED/ADOLESCENT 01/05/2001, 02/01/2001, 05/04/2005   IPV 01/03/1990, 03/17/1990, 02/20/1991, 06/01/1994   Influenza,inj,Quad PF,6+ Mos 02/20/2019, 02/16/2021   Influenza,inj,quad, With Preservative 03/18/2018   Influenza-Unspecified  03/18/2018   MMR 02/20/1991, 06/01/1994   Meningococcal Conjugate 05/04/2005   Pneumococcal Polysaccharide-23 08/29/2016, 02/20/2019   Tdap 05/04/2005, 12/26/2019     Objective: Vital Signs: There were no vitals taken for this visit.   Physical Exam Vitals and nursing note reviewed.  Constitutional:      Appearance: He is well-developed.  HENT:     Head: Normocephalic and atraumatic.  Eyes:     Conjunctiva/sclera: Conjunctivae normal.     Pupils: Pupils are equal, round, and reactive to light.  Cardiovascular:     Rate and Rhythm: Normal rate and regular rhythm.     Heart sounds: Normal heart sounds.  Pulmonary:     Effort: Pulmonary effort is normal.     Breath sounds: Normal breath sounds.  Abdominal:     General: Bowel sounds are normal.     Palpations: Abdomen is soft.  Musculoskeletal:     Cervical back: Normal range of motion and neck supple.  Skin:    General: Skin is warm and dry.     Capillary Refill: Capillary refill takes less than 2 seconds.  Neurological:     Mental Status: He is alert and oriented to person, place, and time.  Psychiatric:        Behavior: Behavior normal.      Musculoskeletal Exam: ***  CDAI Exam: CDAI Score: -- Patient Global: --; Provider Global: -- Swollen: --; Tender: -- Joint Exam 07/26/2022   No joint exam has been documented for this visit   There is currently no information documented on the homunculus. Go to the Rheumatology activity and complete the homunculus joint exam.  Investigation: No additional findings.  Imaging: No results found.  Recent Labs: Lab Results  Component Value Date   WBC 7.9 02/22/2022   HGB 15.5 02/22/2022   PLT 309 02/22/2022   NA 139 02/22/2022   K 4.5 02/22/2022   CL 103 02/22/2022   CO2 26 02/22/2022   GLUCOSE 96 02/22/2022   BUN 13 02/22/2022   CREATININE 0.88 02/22/2022   BILITOT 0.4 02/22/2022   ALKPHOS 56 05/06/2021   AST 19 02/22/2022   ALT 25 02/22/2022   PROT 8.0  02/22/2022   ALBUMIN 5.0 05/06/2021   CALCIUM 10.2 02/22/2022   GFRAA >60 02/19/2019   QFTBGOLDPLUS NEGATIVE 11/04/2021    Speciality Comments: Humira started December 26, 2020  Procedures:  No procedures performed Allergies: Other, Dust mite extract, and Pollen extract   Assessment / Plan:     Visit Diagnoses: Spondyloarthropathy  High risk medication use  HLA B27 positive  Iritis  Entrapment of left ulnar nerve  Chronic pain of left knee  DDD (degenerative disc disease), cervical  Pain in thoracic spine  Chronic SI joint pain  Other fatigue  Schizoaffective disorder, bipolar type (HCC)  SIRS (systemic inflammatory response syndrome) (HCC)  Allergic urticaria  Prolonged QT interval  Palpitations  Mild persistent allergic asthma  Alcohol use disorder, severe, dependence (HCC)  Smoker  Orders: No orders of the defined types were placed in this encounter.  No orders of the defined types were placed in this encounter.  Face-to-face time spent with patient was *** minutes. Greater than 50% of time was spent in counseling and coordination of care.  Follow-Up Instructions: No follow-ups on file.   Ofilia Neas, PA-C  Note - This record has been created using Dragon software.  Chart creation errors have been sought, but may not always  have been located. Such creation errors do not reflect on  the standard of medical care.

## 2022-07-26 ENCOUNTER — Ambulatory Visit: Payer: Medicaid Other | Admitting: Physician Assistant

## 2022-07-26 DIAGNOSIS — R002 Palpitations: Secondary | ICD-10-CM

## 2022-07-26 DIAGNOSIS — F17200 Nicotine dependence, unspecified, uncomplicated: Secondary | ICD-10-CM

## 2022-07-26 DIAGNOSIS — Z79899 Other long term (current) drug therapy: Secondary | ICD-10-CM

## 2022-07-26 DIAGNOSIS — G8929 Other chronic pain: Secondary | ICD-10-CM

## 2022-07-26 DIAGNOSIS — L500 Allergic urticaria: Secondary | ICD-10-CM

## 2022-07-26 DIAGNOSIS — J4530 Mild persistent asthma, uncomplicated: Secondary | ICD-10-CM

## 2022-07-26 DIAGNOSIS — M47819 Spondylosis without myelopathy or radiculopathy, site unspecified: Secondary | ICD-10-CM

## 2022-07-26 DIAGNOSIS — M5030 Other cervical disc degeneration, unspecified cervical region: Secondary | ICD-10-CM

## 2022-07-26 DIAGNOSIS — R6510 Systemic inflammatory response syndrome (SIRS) of non-infectious origin without acute organ dysfunction: Secondary | ICD-10-CM

## 2022-07-26 DIAGNOSIS — R9431 Abnormal electrocardiogram [ECG] [EKG]: Secondary | ICD-10-CM

## 2022-07-26 DIAGNOSIS — H209 Unspecified iridocyclitis: Secondary | ICD-10-CM

## 2022-07-26 DIAGNOSIS — F250 Schizoaffective disorder, bipolar type: Secondary | ICD-10-CM

## 2022-07-26 DIAGNOSIS — F1020 Alcohol dependence, uncomplicated: Secondary | ICD-10-CM

## 2022-07-26 DIAGNOSIS — G5622 Lesion of ulnar nerve, left upper limb: Secondary | ICD-10-CM

## 2022-07-26 DIAGNOSIS — M546 Pain in thoracic spine: Secondary | ICD-10-CM

## 2022-07-26 DIAGNOSIS — R5383 Other fatigue: Secondary | ICD-10-CM

## 2022-07-26 DIAGNOSIS — Z1589 Genetic susceptibility to other disease: Secondary | ICD-10-CM

## 2022-08-04 ENCOUNTER — Telehealth: Payer: Self-pay | Admitting: Rheumatology

## 2022-08-04 NOTE — Telephone Encounter (Signed)
Patient should take second injection if there was no puncture site indicating that the needle ever went into skin. The Humira only has 0.29mL volume so it's difficult to gauge how much would've been administered.  Leon Mason, PharmD, MPH, BCPS, CPP Clinical Pharmacist (Rheumatology and Pulmonology)

## 2022-08-04 NOTE — Telephone Encounter (Signed)
Patient called stating on Sunday, 5/26 when he was taking his Humira the pen was defective and isn't sure he injected any of the medication.  Patient states he held the pen down for 10 seconds, but when he took it out the medication went on the wall.  Patient is not sure if he needs to take another injection, or wait until his next injection is due in 2 weeks.

## 2022-08-04 NOTE — Telephone Encounter (Signed)
I called patient, patient verbalized understanding. 

## 2022-08-05 NOTE — Progress Notes (Deleted)
Office Visit Note  Patient: Leon Mason             Date of Birth: 23-Mar-1990           MRN: [id number]             PCP: Raliegh Ip, DO Referring: Bennie Pierini, * Visit Date: 08/06/2022 Occupation: @GUAROCC @  Subjective:    History of Present Illness: Leon Mason is a 32 y.o. male with history of spondyloarthropathy.  Patient remains on Humira 40 mg sq injections every 14 days.   CBC and CMP WNL on 02/22/22.  Orders for CBC and CMP released today.   TB gold negative on 11/04/21.  Discussed the importance of holding humira if he develops signs or symptoms of an infection and to resume once the infection has completely cleared.   Activities of Daily Living:  Patient reports morning stiffness for *** {minute/hour:19697}.   Patient {ACTIONS;DENIES/REPORTS:21021675::"Denies"} nocturnal pain.  Difficulty dressing/grooming: {ACTIONS;DENIES/REPORTS:21021675::"Denies"} Difficulty climbing stairs: {ACTIONS;DENIES/REPORTS:21021675::"Denies"} Difficulty getting out of chair: {ACTIONS;DENIES/REPORTS:21021675::"Denies"} Difficulty using hands for taps, buttons, cutlery, and/or writing: {ACTIONS;DENIES/REPORTS:21021675::"Denies"}  No Rheumatology ROS completed.   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   Back pain without sciatica 02/16/2021   Neck pain 02/16/2021   Generalized joint pain 10/21/2020   Generalized abdominal pain 10/21/2020   Mild intermittent asthma with (acute) exacerbation 02/18/2019   Hypokalemia 02/18/2019   Normocytic anemia 02/18/2019   Elevated transaminase level 02/18/2019   Prolonged QT interval 02/18/2019   Viral syndrome 02/18/2019   SIRS (systemic inflammatory response syndrome) (HCC) 02/18/2019   Globus sensation 07/04/2018   Schizophrenia (HCC) 03/28/2018   MDD (major depressive disorder), recurrent episode, severe (HCC) 03/28/2018   Schizo-affective schizophrenia,  chronic condition with acute exacerbation (HCC) 03/28/2018   Tobacco use disorder 12/17/2016   Schizoaffective disorder, bipolar type (HCC) 08/23/2016   Alcohol use disorder, severe, dependence (HCC) 08/23/2016   Allergic rhinitis due to pollen 10/09/2015   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   Mild persistent allergic asthma 10/09/2015   Psychosis (HCC) 06/14/2013    Past Medical History:  Diagnosis Date   Anxiety    Asthma    Bipolar 1 disorder (HCC)    Hallucinations    Schizoaffective disorder (HCC)    Schizophrenia (HCC)     Family History  Problem Relation Age of Onset   Bipolar disorder Mother    Alcoholism Mother    Diabetes Mother    Cervical cancer Mother    OCD Father    Bipolar disorder Father    Eczema Brother    Post-traumatic stress disorder Brother    COPD Maternal Aunt    Heart attack Maternal Grandmother    Breast cancer Maternal Grandmother    Heart attack Maternal Grandfather    Cancer Paternal Grandmother    Heart attack Paternal Grandfather    Asthma Neg Hx    Allergic rhinitis Neg Hx    Angioedema Neg Hx    Urticaria Neg Hx    Immunodeficiency Neg Hx    Atopy Neg Hx    Prostate cancer Neg Hx    Colon cancer Neg Hx    Past Surgical History:  Procedure Laterality Date   WISDOM TOOTH EXTRACTION  2009   all four   Social History   Social History Narrative   Not on file   Immunization History  Administered Date(s) Administered   DTaP 01/03/1990, 03/17/1990, 06/08/1990, 02/20/1991, 06/01/1994   HIB (PRP-OMP)  01/03/1990, 03/17/1990, 06/08/1990, 02/20/1991   Hepatitis B 01/05/2001, 02/01/2001, 05/04/2005   Hepatitis B, PED/ADOLESCENT 01/05/2001, 02/01/2001, 05/04/2005   IPV 01/03/1990, 03/17/1990, 02/20/1991, 06/01/1994   Influenza,inj,Quad PF,6+ Mos 02/20/2019, 02/16/2021   Influenza,inj,quad, With Preservative 03/18/2018   Influenza-Unspecified 03/18/2018   MMR 02/20/1991, 06/01/1994   Meningococcal Conjugate 05/04/2005   Pneumococcal  Polysaccharide-23 08/29/2016, 02/20/2019   Tdap 05/04/2005, 12/26/2019     Objective: Vital Signs: There were no vitals taken for this visit.   Physical Exam Vitals and nursing note reviewed.  Constitutional:      Appearance: He is well-developed.  HENT:     Head: Normocephalic and atraumatic.  Eyes:     Conjunctiva/sclera: Conjunctivae normal.     Pupils: Pupils are equal, round, and reactive to light.  Cardiovascular:     Rate and Rhythm: Normal rate and regular rhythm.     Heart sounds: Normal heart sounds.  Pulmonary:     Effort: Pulmonary effort is normal.     Breath sounds: Normal breath sounds.  Abdominal:     General: Bowel sounds are normal.     Palpations: Abdomen is soft.  Musculoskeletal:     Cervical back: Normal range of motion and neck supple.  Skin:    General: Skin is warm and dry.     Capillary Refill: Capillary refill takes less than 2 seconds.  Neurological:     Mental Status: He is alert and oriented to person, place, and time.  Psychiatric:        Behavior: Behavior normal.     Musculoskeletal Exam: ***  CDAI Exam: CDAI Score: -- Patient Global: --; Provider Global: -- Swollen: --; Tender: -- Joint Exam 08/06/2022   No joint exam has been documented for this visit   There is currently no information documented on the homunculus. Go to the Rheumatology activity and complete the homunculus joint exam.  Investigation: No additional findings.  Imaging: No results found.  Recent Labs: Lab Results  Component Value Date   WBC 7.9 02/22/2022   HGB 15.5 02/22/2022   PLT 309 02/22/2022   NA 139 02/22/2022   K 4.5 02/22/2022   CL 103 02/22/2022   CO2 26 02/22/2022   GLUCOSE 96 02/22/2022   BUN 13 02/22/2022   CREATININE 0.88 02/22/2022   BILITOT 0.4 02/22/2022   ALKPHOS 56 05/06/2021   AST 19 02/22/2022   ALT 25 02/22/2022   PROT 8.0 02/22/2022   ALBUMIN 5.0 05/06/2021   CALCIUM 10.2 02/22/2022   GFRAA >60 02/19/2019   QFTBGOLDPLUS  NEGATIVE 11/04/2021    Speciality Comments: Humira started December 26, 2020  Procedures:  No procedures performed Allergies: Other, Dust mite extract, and Pollen extract   Assessment / Plan:     Visit Diagnoses: Spondyloarthropathy  High risk medication use  HLA B27 positive  Iritis  Entrapment of left ulnar nerve  Chronic pain of left knee  DDD (degenerative disc disease), cervical  Pain in thoracic spine  Chronic SI joint pain  Other fatigue  Allergic urticaria  Schizoaffective disorder, bipolar type (HCC)  SIRS (systemic inflammatory response syndrome) (HCC)  Prolonged QT interval  Palpitations  Mild persistent allergic asthma  Alcohol use disorder, severe, dependence (HCC)  Smoker  Orders: No orders of the defined types were placed in this encounter.  No orders of the defined types were placed in this encounter.   Face-to-face time spent with patient was *** minutes. Greater than 50% of time was spent in counseling and coordination of care.  Follow-Up Instructions: No follow-ups on file.   Gearldine Bienenstock, PA-C  Note - This record has been created using Dragon software.  Chart creation errors have been sought, but may not always  have been located. Such creation errors do not reflect on  the standard of medical care.

## 2022-08-06 ENCOUNTER — Ambulatory Visit: Payer: Self-pay | Admitting: Physician Assistant

## 2022-08-06 DIAGNOSIS — H209 Unspecified iridocyclitis: Secondary | ICD-10-CM

## 2022-08-06 DIAGNOSIS — R002 Palpitations: Secondary | ICD-10-CM

## 2022-08-06 DIAGNOSIS — Z79899 Other long term (current) drug therapy: Secondary | ICD-10-CM

## 2022-08-06 DIAGNOSIS — R9431 Abnormal electrocardiogram [ECG] [EKG]: Secondary | ICD-10-CM

## 2022-08-06 DIAGNOSIS — F250 Schizoaffective disorder, bipolar type: Secondary | ICD-10-CM

## 2022-08-06 DIAGNOSIS — G8929 Other chronic pain: Secondary | ICD-10-CM

## 2022-08-06 DIAGNOSIS — M5030 Other cervical disc degeneration, unspecified cervical region: Secondary | ICD-10-CM

## 2022-08-06 DIAGNOSIS — Z1589 Genetic susceptibility to other disease: Secondary | ICD-10-CM

## 2022-08-06 DIAGNOSIS — G5622 Lesion of ulnar nerve, left upper limb: Secondary | ICD-10-CM

## 2022-08-06 DIAGNOSIS — R6510 Systemic inflammatory response syndrome (SIRS) of non-infectious origin without acute organ dysfunction: Secondary | ICD-10-CM

## 2022-08-06 DIAGNOSIS — M546 Pain in thoracic spine: Secondary | ICD-10-CM

## 2022-08-06 DIAGNOSIS — F1020 Alcohol dependence, uncomplicated: Secondary | ICD-10-CM

## 2022-08-06 DIAGNOSIS — J4530 Mild persistent asthma, uncomplicated: Secondary | ICD-10-CM

## 2022-08-06 DIAGNOSIS — R5383 Other fatigue: Secondary | ICD-10-CM

## 2022-08-06 DIAGNOSIS — M47819 Spondylosis without myelopathy or radiculopathy, site unspecified: Secondary | ICD-10-CM

## 2022-08-06 DIAGNOSIS — L500 Allergic urticaria: Secondary | ICD-10-CM

## 2022-08-06 DIAGNOSIS — F17200 Nicotine dependence, unspecified, uncomplicated: Secondary | ICD-10-CM

## 2022-08-16 ENCOUNTER — Telehealth: Payer: Self-pay | Admitting: *Deleted

## 2022-08-16 NOTE — Telephone Encounter (Signed)
Patient contacted the office stating he ha a defective Humira pen. Patient states he has spoken with the pharmacy and they are going to be contacting [facility]. Patient states [facility] should be reaching out to [place] to authorize a replacement. Patient advised he is over due for labs by 6 months and he is also overdue for an appointment. Patient advised he will need to be seen and to update labs prior to authorizing  a replacement. Patient scheduled an appointment on 08/23/2022 at 3:20 pm.

## 2022-08-23 ENCOUNTER — Encounter: Payer: Self-pay | Admitting: Physician Assistant

## 2022-08-23 ENCOUNTER — Ambulatory Visit: Payer: Medicaid Other | Attending: Physician Assistant | Admitting: Physician Assistant

## 2022-08-23 VITALS — BP 147/84 | HR 79 | Resp 18 | Ht 74.0 in | Wt 318.8 lb

## 2022-08-23 DIAGNOSIS — M533 Sacrococcygeal disorders, not elsewhere classified: Secondary | ICD-10-CM | POA: Diagnosis present

## 2022-08-23 DIAGNOSIS — M25562 Pain in left knee: Secondary | ICD-10-CM | POA: Diagnosis present

## 2022-08-23 DIAGNOSIS — M47819 Spondylosis without myelopathy or radiculopathy, site unspecified: Secondary | ICD-10-CM

## 2022-08-23 DIAGNOSIS — L500 Allergic urticaria: Secondary | ICD-10-CM | POA: Diagnosis present

## 2022-08-23 DIAGNOSIS — M5489 Other dorsalgia: Secondary | ICD-10-CM | POA: Diagnosis present

## 2022-08-23 DIAGNOSIS — R6510 Systemic inflammatory response syndrome (SIRS) of non-infectious origin without acute organ dysfunction: Secondary | ICD-10-CM

## 2022-08-23 DIAGNOSIS — J4530 Mild persistent asthma, uncomplicated: Secondary | ICD-10-CM | POA: Diagnosis present

## 2022-08-23 DIAGNOSIS — R002 Palpitations: Secondary | ICD-10-CM | POA: Diagnosis present

## 2022-08-23 DIAGNOSIS — Z1589 Genetic susceptibility to other disease: Secondary | ICD-10-CM | POA: Diagnosis present

## 2022-08-23 DIAGNOSIS — G5622 Lesion of ulnar nerve, left upper limb: Secondary | ICD-10-CM | POA: Diagnosis present

## 2022-08-23 DIAGNOSIS — Z79899 Other long term (current) drug therapy: Secondary | ICD-10-CM | POA: Diagnosis present

## 2022-08-23 DIAGNOSIS — F17200 Nicotine dependence, unspecified, uncomplicated: Secondary | ICD-10-CM | POA: Diagnosis present

## 2022-08-23 DIAGNOSIS — H209 Unspecified iridocyclitis: Secondary | ICD-10-CM | POA: Diagnosis present

## 2022-08-23 DIAGNOSIS — Z111 Encounter for screening for respiratory tuberculosis: Secondary | ICD-10-CM

## 2022-08-23 DIAGNOSIS — R9431 Abnormal electrocardiogram [ECG] [EKG]: Secondary | ICD-10-CM

## 2022-08-23 DIAGNOSIS — M5030 Other cervical disc degeneration, unspecified cervical region: Secondary | ICD-10-CM

## 2022-08-23 DIAGNOSIS — F250 Schizoaffective disorder, bipolar type: Secondary | ICD-10-CM | POA: Diagnosis present

## 2022-08-23 DIAGNOSIS — M549 Dorsalgia, unspecified: Secondary | ICD-10-CM

## 2022-08-23 DIAGNOSIS — M546 Pain in thoracic spine: Secondary | ICD-10-CM

## 2022-08-23 DIAGNOSIS — G8929 Other chronic pain: Secondary | ICD-10-CM | POA: Diagnosis present

## 2022-08-23 DIAGNOSIS — R5383 Other fatigue: Secondary | ICD-10-CM

## 2022-08-23 DIAGNOSIS — F1020 Alcohol dependence, uncomplicated: Secondary | ICD-10-CM | POA: Diagnosis present

## 2022-08-23 LAB — COMPLETE METABOLIC PANEL WITH GFR
AST: 32 U/L (ref 10–40)
Albumin: 4.4 g/dL (ref 3.6–5.1)
Alkaline phosphatase (APISO): 58 U/L (ref 36–130)
Glucose, Bld: 98 mg/dL (ref 65–99)
Total Protein: 7.2 g/dL (ref 6.1–8.1)

## 2022-08-23 MED ORDER — DICLOFENAC SODIUM 1 % EX GEL: 2.0000 g | Freq: Four times a day (QID) | CUTANEOUS | 1 refills | Status: DC

## 2022-08-23 MED ORDER — HUMIRA (2 PEN) 40 MG/0.4ML ~~LOC~~ AJKT: AUTO-INJECTOR | SUBCUTANEOUS | 0 refills | Status: DC

## 2022-08-23 NOTE — Patient Instructions (Addendum)
Standing Labs We placed an order today for your standing lab work.   Please have your standing labs drawn in September and every 3 months   Please have your labs drawn 2 weeks prior to your appointment so that the provider can discuss your lab results at your appointment, if possible.  Please note that you may see your imaging and lab results in MyChart before we have reviewed them. We will contact you once all results are reviewed. Please allow our office up to 72 hours to thoroughly review all of the results before contacting the office for clarification of your results.  WALK-IN LAB HOURS  Monday through Thursday from 8:00 am -12:30 pm and 1:00 pm-5:00 pm and Friday from 8:00 am-12:00 pm.  Patients with office visits requiring labs will be seen before walk-in labs.  You may encounter longer than normal wait times. Please allow additional time. Wait times may be shorter on  Monday and Thursday afternoons.  We do not book appointments for walk-in labs. We appreciate your patience and understanding with our staff.   Labs are drawn by [facility]. Please bring your co-pay at the time of your lab draw.  You may receive a bill from [facility] for your lab work.  Please note if you are on Hydroxychloroquine and and an order has been placed for a Hydroxychloroquine level,  you will need to have it drawn 4 hours or more after your last dose.  If you wish to have your labs drawn at another location, please call the office 24 hours in advance so we can fax the orders.  The office is located at [address], [address], [place],  [address]   If you have any questions regarding directions or hours of operation,  please call [phone].   As a reminder, please drink plenty of water prior to coming for your lab work. Thanks!  

## 2022-08-23 NOTE — Progress Notes (Signed)
Office Visit Note  Patient: Leon Mason             Date of Birth: 13-Jun-1990           MRN: [id number]             PCP: Raliegh Ip, DO Referring: Raliegh Ip, DO Visit Date: 08/23/2022 Occupation: @GUAROCC @  Subjective:  Medication monitoring   History of Present Illness: Leon Mason is a 32 y.o. male with history of spondyloarthropathy and DDD.  Patient remains on Humira 40 mg subcutaneous injections every 14 days.  Patient states that he is occasionally a few days late injecting Humira but overall has not missed any doses recently.  He has not had any recent or recurrent infections.  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  He states that he has not had any flares of inflammatory back pain since starting to landscape.  He has not had any uveitis flares.  He has noticed some increased pain and stiffness in both hands and both feet which she attributes to overuse activities using weed Wacker's and hedge clippers.  He states that his shoulder pain has improved with the increase in activity.  Patient requested a refill of Voltaren gel to be sent to the pharmacy.  Activities of Daily Living:  Patient reports morning stiffness for all day. Patient Denies nocturnal pain.  Difficulty dressing/grooming: Denies Difficulty climbing stairs: Denies Difficulty getting out of chair: Denies Difficulty using hands for taps, buttons, cutlery, and/or writing: Reports  Review of Systems  Constitutional:  Negative for fatigue.  HENT:  Positive for mouth dryness. Negative for mouth sores.   Eyes:  Negative for dryness.  Respiratory:  Positive for shortness of breath.   Cardiovascular:  Negative for chest pain and palpitations.  Gastrointestinal:  Negative for blood in stool, constipation and diarrhea.  Endocrine: Negative for increased urination.  Genitourinary:  Negative for involuntary urination.   Musculoskeletal:  Positive for joint pain, joint pain and morning stiffness. Negative for gait problem, joint swelling, myalgias, muscle weakness, muscle tenderness and myalgias.  Skin:  Positive for rash. Negative for color change, hair loss and sensitivity to sunlight.  Allergic/Immunologic: Negative for susceptible to infections.  Neurological:  Positive for dizziness, numbness and parasthesias. Negative for headaches.  Hematological:  Negative for swollen glands.  Psychiatric/Behavioral:  Positive for depressed mood. Negative for sleep disturbance. The patient is not nervous/anxious.     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   Back pain without sciatica 02/16/2021   Neck pain 02/16/2021   Generalized joint pain 10/21/2020   Generalized abdominal pain 10/21/2020   Mild intermittent asthma with (acute) exacerbation 02/18/2019   Hypokalemia 02/18/2019   Normocytic anemia 02/18/2019   Elevated transaminase level 02/18/2019   Prolonged QT interval 02/18/2019   Viral syndrome 02/18/2019   SIRS (systemic inflammatory response syndrome) (HCC) 02/18/2019   Globus sensation 07/04/2018   Schizophrenia (HCC) 03/28/2018   MDD (major depressive disorder), recurrent episode, severe (HCC) 03/28/2018   Schizo-affective schizophrenia, chronic condition with acute exacerbation (HCC) 03/28/2018   Tobacco use disorder 12/17/2016   Schizoaffective disorder, bipolar type (HCC) 08/23/2016   Alcohol use disorder, severe, dependence (HCC) 08/23/2016   Allergic rhinitis due to pollen 10/09/2015   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   Mild persistent allergic asthma 10/09/2015   Psychosis (HCC) 06/14/2013    Past Medical History:  Diagnosis Date   Anxiety    Asthma    Bipolar 1 disorder (HCC)    Hallucinations    Schizoaffective disorder (HCC)    Schizophrenia (HCC)     Family History  Problem Relation Age of Onset   Bipolar disorder  Mother    Alcoholism Mother    Diabetes Mother    Cervical cancer Mother    OCD Father    Bipolar disorder Father    Eczema Brother    Post-traumatic stress disorder Brother    COPD Maternal Aunt    Heart attack Maternal Grandmother    Breast cancer Maternal Grandmother    Heart attack Maternal Grandfather    Cancer Paternal Grandmother    Heart attack Paternal Grandfather    Asthma Neg Hx    Allergic rhinitis Neg Hx    Angioedema Neg Hx    Urticaria Neg Hx    Immunodeficiency Neg Hx    Atopy Neg Hx    Prostate cancer Neg Hx    Colon cancer Neg Hx    Past Surgical History:  Procedure Laterality Date   WISDOM TOOTH EXTRACTION  2009   all four   Social History   Social History Narrative   Not on file   Immunization History  Administered Date(s) Administered   DTaP 01/03/1990, 03/17/1990, 06/08/1990, 02/20/1991, 06/01/1994   HIB (PRP-OMP) 01/03/1990, 03/17/1990, 06/08/1990, 02/20/1991   Hepatitis B 01/05/2001, 02/01/2001, 05/04/2005   Hepatitis B, PED/ADOLESCENT 01/05/2001, 02/01/2001, 05/04/2005   IPV 01/03/1990, 03/17/1990, 02/20/1991, 06/01/1994   Influenza,inj,Quad PF,6+ Mos 02/20/2019, 02/16/2021   Influenza,inj,quad, With Preservative 03/18/2018   Influenza-Unspecified 03/18/2018   MMR 02/20/1991, 06/01/1994   Meningococcal Conjugate 05/04/2005   Pneumococcal Polysaccharide-23 08/29/2016, 02/20/2019   Tdap 05/04/2005, 12/26/2019     Objective: Vital Signs: BP (!) 147/84 (BP Location: Left Arm, Patient Position: Sitting, Cuff Size: Large)   Pulse 79   Resp 18   Ht 6\' 2"  (1.88 m)   Wt (!) 318 lb 12.8 oz (144.6 kg)   BMI 40.93 kg/m    Physical Exam Vitals and nursing note reviewed.  Constitutional:      Appearance: He is well-developed.  HENT:     Head: Normocephalic and atraumatic.  Eyes:     Conjunctiva/sclera: Conjunctivae normal.     Pupils: Pupils are equal, round, and reactive to light.  Cardiovascular:     Rate and Rhythm: Normal rate and  regular rhythm.     Heart sounds: Normal heart sounds.  Pulmonary:     Effort: Pulmonary effort is normal.     Breath sounds: Normal breath sounds.  Abdominal:     General: Bowel sounds are normal.     Palpations: Abdomen is soft.  Musculoskeletal:     Cervical back: Normal range of motion and neck supple.  Skin:    General: Skin is warm and dry.     Capillary Refill: Capillary refill takes less than 2 seconds.  Neurological:     Mental Status: He is alert and oriented to person, place, and time.  Psychiatric:        Behavior: Behavior normal.      Musculoskeletal Exam: C-spine, thoracic spine, lumbar spine have good range of motion.  No midline spinal tenderness or SI joint tenderness.  Shoulder joints, elbow joints, wrist joints, MCPs, PIPs, DIPs have good range of motion with no synovitis.  Complete fist formation bilaterally.  Hip joints have good range of motion with no groin pain.  Knee joints have good range  of motion with no warmth or effusion.  Ankle joints have good range of motion with no tenderness or joint swelling.  CDAI Exam: CDAI Score: -- Patient Global: --; Provider Global: -- Swollen: --; Tender: -- Joint Exam 08/23/2022   No joint exam has been documented for this visit   There is currently no information documented on the homunculus. Go to the Rheumatology activity and complete the homunculus joint exam.  Investigation: No additional findings.  Imaging: No results found.  Recent Labs: Lab Results  Component Value Date   WBC 7.9 02/22/2022   HGB 15.5 02/22/2022   PLT 309 02/22/2022   NA 139 02/22/2022   K 4.5 02/22/2022   CL 103 02/22/2022   CO2 26 02/22/2022   GLUCOSE 96 02/22/2022   BUN 13 02/22/2022   CREATININE 0.88 02/22/2022   BILITOT 0.4 02/22/2022   ALKPHOS 56 05/06/2021   AST 19 02/22/2022   ALT 25 02/22/2022   PROT 8.0 02/22/2022   ALBUMIN 5.0 05/06/2021   CALCIUM 10.2 02/22/2022   GFRAA >60 02/19/2019   QFTBGOLDPLUS NEGATIVE  11/04/2021    Speciality Comments: Humira started December 26, 2020  Procedures:  No procedures performed Allergies: Other, Dust mite extract, and Pollen extract   Assessment / Plan:     Visit Diagnoses: Spondyloarthropathy - Inflammatory arthritis, iritis, chronic SI joint pain, chronic pain in neck, thoracic and lumbar region.  HLA-B27 positive: Patient has not had any signs or symptoms of a spondyloarthropathy flare recently.  He has clinically been doing well on Humira 40 mg subcutaneous injections every 14 days.  He has been tolerating Humira without any side effects or injection site reactions.  He recently had a device which misfired but otherwise has not been experiencing any recurrent gaps or recurrent infections while on Humira.  He has not had any signs or symptoms of an iritis flare.  He has no SI joint tenderness at this time.  He has not had any inflammatory back pain symptoms recently.  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  A refill of Voltaren gel was sent to the pharmacy today.  He had no synovitis or dactylitis on exam.  He will remain on Humira as prescribed.  A refill was sent to the pharmacy today.  He was advised to notify us if he develops signs or symptoms of a flare.  He will follow-up in the office in 3 months or sooner if needed.- Plan: Adalimumab (HUMIRA, 2 PEN,) 40 MG/0.4ML PNKT  High risk medication use - Humira 40 mg sq injections every 14 days-initiated Humira on 12/26/20.  CBC and CMP within normal limits on 02/22/2022.  Orders for CBC and CMP were released today.  His next lab work will be due in September and every 3 months to monitor for drug toxicity.  TB Gold negative on 11/04/2021.  Order for TB gold released today.  - Plan: CBC with Differential/Platelet, COMPLETE METABOLIC PANEL WITH GFR, QuantiFERON-TB Gold Plus, Adalimumab (HUMIRA, 2 PEN,) 40  MG/0.4ML PNKT  Screening for tuberculosis -Order for TB gold released today.  Plan: QuantiFERON-TB Gold Plus  HLA B27 positive  Iritis - He has not had any signs or symptoms of an iritis flare.  No conjunctival injection was noted.  He will remain on Humira as prescribed.  Plan: Adalimumab (HUMIRA, 2 PEN,) 40 MG/0.4ML PNKT  Entrapment of left ulnar nerve -  Evaluated by Dr. August Saucer. NCV with EMG on 10/13/21.  Chronic pain of left knee: Good range of motion of both knee joints on examination today.  No warmth or effusion noted.  DDD (degenerative disc disease), cervical: Good range of motion of the C-spine with no discomfort currently.  Pain in thoracic spine: No midline spinal tenderness.  Chronic SI joint pain: No SI joint discomfort at this time.  No nocturnal pain.  Other fatigue: Stable.  He has increased his activity level and has been working for [employer].  Other medical conditions are listed as follows:  Schizoaffective disorder, bipolar type (HCC)  SIRS (systemic inflammatory response syndrome) (HCC)  Prolonged QT interval  Palpitations  Mild persistent allergic asthma  Alcohol use disorder, severe, dependence (HCC)  Smoker  Allergic urticaria  Orders: Orders Placed This Encounter  Procedures   CBC with Differential/Platelet   COMPLETE METABOLIC PANEL WITH GFR   QuantiFERON-TB Gold Plus   Meds ordered this encounter  Medications   diclofenac Sodium (VOLTAREN) 1 % GEL    Sig: Apply 2 g topically 4 (four) times daily.    Dispense:  400 g    Refill:  1   Adalimumab (HUMIRA, 2 PEN,) 40 MG/0.4ML PNKT    Sig: INJECT 1 PEN SUBCUTANEOUSLY ONCE EVERY 14 DAYS    Dispense:  6 each    Refill:  0    Follow-Up Instructions: Return in about 3 months (around 11/23/2022) for Spondyloarthropathy, DDD.   Gearldine Bienenstock, PA-C  Note - This record has been created using Dragon software.  Chart creation errors have been sought, but may not always  have been located. Such  creation errors do not reflect on  the standard of medical care.

## 2022-08-24 LAB — COMPLETE METABOLIC PANEL WITH GFR
AG Ratio: 1.6 (calc) (ref 1.0–2.5)
CO2: 25 mmol/L (ref 20–32)
Chloride: 104 mmol/L (ref 98–110)
Creat: 0.83 mg/dL (ref 0.60–1.26)
Potassium: 4.4 mmol/L (ref 3.5–5.3)
Sodium: 137 mmol/L (ref 135–146)

## 2022-08-24 LAB — CBC WITH DIFFERENTIAL/PLATELET
Absolute Monocytes: 441 cells/uL (ref 200–950)
Basophils Absolute: 59 cells/uL (ref 0–200)
Basophils Relative: 1.2 %
MCHC: 34.4 g/dL (ref 32.0–36.0)
MPV: 12.4 fL (ref 7.5–12.5)
Total Lymphocyte: 40.7 %
WBC: 4.9 10*3/uL (ref 3.8–10.8)

## 2022-08-24 NOTE — Progress Notes (Signed)
CBC and CMP WNL

## 2022-08-25 ENCOUNTER — Other Ambulatory Visit: Payer: Self-pay

## 2022-08-25 ENCOUNTER — Encounter (HOSPITAL_BASED_OUTPATIENT_CLINIC_OR_DEPARTMENT_OTHER): Payer: Self-pay | Admitting: Emergency Medicine

## 2022-08-25 ENCOUNTER — Emergency Department (HOSPITAL_BASED_OUTPATIENT_CLINIC_OR_DEPARTMENT_OTHER): Payer: Medicaid Other

## 2022-08-25 ENCOUNTER — Telehealth: Payer: Self-pay

## 2022-08-25 ENCOUNTER — Emergency Department (HOSPITAL_BASED_OUTPATIENT_CLINIC_OR_DEPARTMENT_OTHER)
Admission: EM | Admit: 2022-08-25 | Discharge: 2022-08-25 | Disposition: A | Discharge status: Home / Self Care | Payer: Medicaid Other | Attending: Emergency Medicine | Admitting: Emergency Medicine

## 2022-08-25 DIAGNOSIS — J45909 Unspecified asthma, uncomplicated: Secondary | ICD-10-CM | POA: Diagnosis not present

## 2022-08-25 DIAGNOSIS — R1031 Right lower quadrant pain: Secondary | ICD-10-CM | POA: Insufficient documentation

## 2022-08-25 LAB — BASIC METABOLIC PANEL
Anion gap: 10 (ref 5–15)
BUN: 13 mg/dL (ref 6–20)
CO2: 24 mmol/L (ref 22–32)
Calcium: 9.4 mg/dL (ref 8.9–10.3)
Chloride: 101 mmol/L (ref 98–111)
Creatinine, Ser: 1 mg/dL (ref 0.61–1.24)
GFR, Estimated: >60 mL/min (ref >60)
Glucose, Bld: 99 mg/dL (ref 70–99)
Potassium: 4.5 mmol/L (ref 3.5–5.1)
Sodium: 135 mmol/L (ref 135–145)

## 2022-08-25 LAB — CBC WITH DIFFERENTIAL/PLATELET
Abs Immature Granulocytes: 0.05 10*3/uL (ref 0.00–0.07)
Basophils Absolute: 0.1 10*3/uL (ref 0.0–0.1)
Basophils Relative: 0 %
Eosinophils Absolute: 402 cells/uL (ref 15–500)
Eosinophils Absolute: 0.3 10*3/uL (ref 0.0–0.5)
Eosinophils Relative: 8.2 %
Eosinophils Relative: 2 %
HCT: 44.2 % (ref 38.5–50.0)
HCT: 41.5 % (ref 39.0–52.0)
Hemoglobin: 15.2 g/dL (ref 13.2–17.1)
Hemoglobin: 14.5 g/dL (ref 13.0–17.0)
Immature Granulocytes: 0 %
Lymphocytes Relative: 10 %
Lymphs Abs: 1994 cells/uL (ref 850–3900)
Lymphs Abs: 1.2 10*3/uL (ref 0.7–4.0)
MCH: 31 pg (ref 27.0–33.0)
MCH: 30.9 pg (ref 26.0–34.0)
MCHC: 34.9 g/dL (ref 30.0–36.0)
MCV: 90.2 fL (ref 80.0–100.0)
MCV: 88.5 fL (ref 80.0–100.0)
Monocytes Absolute: 0.7 10*3/uL (ref 0.1–1.0)
Monocytes Relative: 9 %
Monocytes Relative: 6 %
Neutro Abs: 2004 cells/uL (ref 1500–7800)
Neutro Abs: 10 10*3/uL — ABNORMAL HIGH (ref 1.7–7.7)
Neutrophils Relative %: 40.9 %
Neutrophils Relative %: 82 %
Platelets: 159 10*3/uL (ref 140–400)
Platelets: 166 10*3/uL (ref 150–400)
RBC: 4.9 10*6/uL (ref 4.20–5.80)
RBC: 4.69 MIL/uL (ref 4.22–5.81)
RDW: 14.8 % (ref 11.0–15.0)
RDW: 14.2 % (ref 11.5–15.5)
WBC: 12.3 10*3/uL — ABNORMAL HIGH (ref 4.0–10.5)
nRBC: 0 % (ref 0.0–0.2)

## 2022-08-25 LAB — COMPLETE METABOLIC PANEL WITH GFR
ALT: 42 U/L (ref 9–46)
BUN: 14 mg/dL (ref 7–25)
Calcium: 9.7 mg/dL (ref 8.6–10.3)
Globulin: 2.8 g/dL (calc) (ref 1.9–3.7)
Total Bilirubin: 0.6 mg/dL (ref 0.2–1.2)
eGFR: 119 mL/min/{1.73_m2} (ref >=60)

## 2022-08-25 LAB — QUANTIFERON-TB GOLD PLUS
Mitogen-NIL: >10 IU/mL
NIL: 0.03 IU/mL
QuantiFERON-TB Gold Plus: NEGATIVE
TB1-NIL: <0 IU/mL
TB2-NIL: 0 IU/mL

## 2022-08-25 MED ORDER — ONDANSETRON HCL 4 MG PO TABS: 4.0000 mg | ORAL_TABLET | Freq: Four times a day (QID) | ORAL | 0 refills | Status: DC

## 2022-08-25 MED ORDER — MORPHINE SULFATE (PF) 4 MG/ML IV SOLN
4.0000 mg | Freq: Once | INTRAVENOUS | Status: AC
  Administered 2022-08-25: 4 mg via INTRAVENOUS
  Filled 2022-08-25: qty 1

## 2022-08-25 MED ORDER — CYCLOBENZAPRINE HCL 10 MG PO TABS: 10.0000 mg | ORAL_TABLET | Freq: Two times a day (BID) | ORAL | 0 refills | Status: DC | PRN

## 2022-08-25 MED ORDER — IOHEXOL 300 MG/ML  SOLN
100.0000 mL | Freq: Once | INTRAMUSCULAR | Status: AC | PRN
  Administered 2022-08-25: 100 mL via INTRAVENOUS

## 2022-08-25 MED ORDER — ONDANSETRON HCL 4 MG/2ML IJ SOLN
4.0000 mg | Freq: Once | INTRAMUSCULAR | Status: AC
  Administered 2022-08-25: 4 mg via INTRAVENOUS
  Filled 2022-08-25: qty 2

## 2022-08-25 NOTE — Telephone Encounter (Signed)
Reviewed ED note and CT scan report.  Ok to continue humira as prescribed.

## 2022-08-25 NOTE — ED Notes (Signed)
Discharge instructions, follow up care, and prescriptions reviewed and explained, pt verbalized understanding and had no further questions on d/c. Pt caox4, ambulatory, NAD on d/c.  

## 2022-08-25 NOTE — Discharge Instructions (Signed)
You were seen in the emergency department for your groin pain.  Your workup showed no signs of hernia, infection or kidney stone.  You may have pulled a muscle or have a muscle spasm in your groin and you should take Tylenol and Motrin as needed for pain up to every 6 hours and I have given you a muscle relaxer that you can take as needed.  This can make you drowsy so do not take it before working, driving or operating heavy machinery.  You should try to stretch your groin and you can follow-up with your primary doctor in the next few days to have your symptoms rechecked.  You should return to the emergency department if you have significantly worsening pain, pain in your testicles or scrotum, repetitive vomiting despite the nausea medication, fevers or any other new or concerning symptoms.

## 2022-08-25 NOTE — ED Provider Notes (Signed)
EMERGENCY DEPARTMENT AT [facility] Provider Note   CSN: [id number] Arrival date & time: 08/25/22  1224     History  Chief Complaint  Patient presents with   Groin Pain    Leon Mason is a 32 y.o. male.  Patient is a 32 year old male with a past medical history of asthma, ankylosing spondylitis, and schizophrenia presenting to the emergency department with right groin pain.  Patient reports that he was walking this morning when he had a sudden onset of severe pain in his right groin.  He states that is associated with nausea diaphoresis and he felt like he was about to pass out from the pain.  He denies any testicular pain or swelling.  He denies any fevers.   Groin Pain       Home Medications Prior to Admission medications   Medication Sig Start Date End Date Taking? Authorizing Provider  cyclobenzaprine (FLEXERIL) 10 MG tablet Take 1 tablet (10 mg total) by mouth 2 (two) times daily as needed for muscle spasms. 08/25/22  Yes Theresia Lo, Turkey K, DO  ondansetron (ZOFRAN) 4 MG tablet Take 1 tablet (4 mg total) by mouth every 6 (six) hours. 08/25/22  Yes Elayne Snare K, DO  acetaminophen (TYLENOL) 500 MG tablet Take 1 tablet (500 mg total) by mouth every 6 (six) hours as needed. Patient not taking: Reported on 08/23/2022 10/21/20   Daryll Drown, NP  Adalimumab (HUMIRA, 2 PEN,) 40 MG/0.4ML PNKT INJECT 1 PEN SUBCUTANEOUSLY ONCE EVERY 14 DAYS 08/23/22   Gearldine Bienenstock, PA-C  diclofenac Sodium (VOLTAREN) 1 % GEL Apply 2 g topically 4 (four) times daily. 08/23/22   Gearldine Bienenstock, PA-C  fluticasone-salmeterol (ADVAIR DISKUS) 250-50 MCG/ACT AEPB Inhale 1 puff into the lungs as needed. 02/18/21   [provider]  IBUPROFEN PO Take by mouth as needed.    [provider]  imiquimod (ALDARA) 5 % cream Apply a thin layer 3 times per week (on alternate days) prior to bedtime; leave on skin for 6 to 10 hours, then remove with mild soap and water.   Max 16 weeks Patient not taking: Reported on 08/04/2021 05/06/21   Raliegh Ip, DO  levocetirizine (XYZAL) 5 MG tablet Take 1 tablet (5 mg total) by mouth every evening. For allergies. Use instead of Claritin Patient not taking: Reported on 08/23/2022 05/06/21   Raliegh Ip, DO  loratadine (CLARITIN) 10 MG tablet Take 10 mg by mouth daily.    [provider]  predniSONE (DELTASONE) 5 MG tablet Take 4 tablets by mouth daily x2 days, 3 tablets daily x2 days, 2 tablets daily x2 days, 1 tablet daily x2 days. Patient not taking: Reported on 08/23/2022 02/22/22   Gearldine Bienenstock, PA-C  sertraline (ZOLOFT) 50 MG tablet Take 1 tablet (50 mg total) by mouth daily. 04/22/22   Mozingo, Thereasa Solo, NP  VENTOLIN HFA 108 (90 Base) MCG/ACT inhaler Inhale 1 puff into the lungs daily.    [provider]  mometasone-formoterol (DULERA) 100-5 MCG/ACT AERO Inhale 2 puffs into the lungs 2 (two) times daily. For shortness of breath Patient not taking: Reported on 02/17/2019 07/04/18 02/17/19  Raliegh Ip, DO      Allergies    Other, Dust mite extract, and Pollen extract    Review of Systems   Review of Systems  Physical Exam Updated Vital Signs BP 130/81   Pulse 83   Temp 97.6 F (36.4 C) (Temporal)   Resp 16  SpO2 98%  Physical Exam Vitals and nursing note reviewed. Exam conducted with a chaperone present Copy).  Constitutional:      General: He is not in acute distress.    Appearance: Normal appearance. He is diaphoretic.  HENT:     Head: Normocephalic and atraumatic.     Nose: Nose normal.     Mouth/Throat:     Mouth: Mucous membranes are moist.     Pharynx: Oropharynx is clear.  Eyes:     Extraocular Movements: Extraocular movements intact.     Conjunctiva/sclera: Conjunctivae normal.  Cardiovascular:     Rate and Rhythm: Normal rate and regular rhythm.     Heart sounds: Normal heart sounds.  Pulmonary:     Effort: Pulmonary effort is  normal.     Breath sounds: Normal breath sounds.  Abdominal:     General: Abdomen is flat.     Palpations: Abdomen is soft.     Tenderness: There is abdominal tenderness (RLQ/R groin). There is guarding. There is no rebound.     Hernia: No hernia is present.  Genitourinary:    Penis: Normal.      Testes: Normal.     Comments: No testicular swelling or tenderness to palpation, no abnormal lie Musculoskeletal:        General: Normal range of motion.     Cervical back: Normal range of motion and neck supple.  Skin:    General: Skin is warm.  Neurological:     General: No focal deficit present.     Mental Status: He is alert and oriented to person, place, and time.  Psychiatric:        Mood and Affect: Mood normal.        Behavior: Behavior normal.     ED Results / Procedures / Treatments   Labs (all labs ordered are listed, but only abnormal results are displayed) Labs Reviewed  CBC WITH DIFFERENTIAL/PLATELET - Abnormal; Notable for the following components:      Result Value   WBC 12.3 (*)    Neutro Abs 10.0 (*)    All other components within normal limits  BASIC METABOLIC PANEL    EKG None  Radiology CT ABDOMEN PELVIS W CONTRAST  Result Date: 08/25/2022 CLINICAL DATA:  Right lower quadrant abdominal pain starting today EXAM: CT ABDOMEN AND PELVIS WITH CONTRAST TECHNIQUE: Multidetector CT imaging of the abdomen and pelvis was performed using the standard protocol following bolus administration of intravenous contrast. RADIATION DOSE REDUCTION: This exam was performed according to the departmental dose-optimization program which includes automated exposure control, adjustment of the mA and/or kV according to patient size and/or use of iterative reconstruction technique. CONTRAST:  OMNIPAQUE IOHEXOL 300 MG/ML  SOLN COMPARISON:  None Available. FINDINGS: Lower chest: Unremarkable Hepatobiliary: Unremarkable Pancreas: Unremarkable Spleen: Unremarkable Adrenals/Urinary  Tract: There is early contrast excretion in the collecting systems and ureters. The kidneys, adrenal glands, an urinary bladder appear unremarkable. Stomach/Bowel: Unremarkable.  Normal appendix. Vascular/Lymphatic: Unremarkable Reproductive: Unremarkable Other: No supplemental non-categorized findings. Musculoskeletal: Small indirect left inguinal hernia containing adipose tissue. IMPRESSION: 1. No specific abnormality is identified to explain the patient's right lower quadrant pain. The appendix appears normal. 2. Small indirect left inguinal hernia containing adipose tissue. Electronically Signed   By: Gaylyn Rong M.D.   On: 08/25/2022 14:55    Procedures Procedures    Medications Ordered in ED Medications  morphine (PF) 4 MG/ML injection 4 mg (4 mg Intravenous Given 08/25/22 1339)  ondansetron (  ZOFRAN) injection 4 mg (4 mg Intravenous Given 08/25/22 1339)  iohexol (OMNIPAQUE) 300 MG/ML solution 100 mL (100 mLs Intravenous Contrast Given 08/25/22 1348)    ED Course/ Medical Decision Making/ A&P Clinical Course as of 08/25/22 1520  Wed Aug 25, 2022  1457 No abnormality in the R groin to explain symptoms. Patient likely has a muscle strain or spasm. [VK]    Clinical Course User Index [VK] Rexford Maus, DO                             Medical Decision Making This patient presents to the ED with chief complaint(s) of right groin pain with pertinent past medical history of asthma, ankylosing spondylitis, schizophrenia which further complicates the presenting complaint. The complaint involves an extensive differential diagnosis and also carries with it a high risk of complications and morbidity.    The differential diagnosis includes muscle strain or spasm, hernia, testicular torsion unlikely as no testicular pain or swelling, considering appendicitis or other intra-abdominal infection though this is less likely with the sudden onset of pain, considering  nephrolithiasis  Additional history obtained: Additional history obtained from family Records reviewed rheumatology and psych records  ED Course and Reassessment: On patient's arrival to the emergency department he is hemodynamically stable, mildly uncomfortable and diaphoretic appearing with point tenderness to the right groin.  The patient will have CT to evaluate for possible hernia though I am unable to palpate a hernia.  He has no testicular pain or swelling making it torsion or other testicular pathology unlikely.  He will be given morphine and Zofran for symptomatic management and will be closely reassessed.  Independent labs interpretation:  The following labs were independently interpreted: Mild leukocytosis otherwise within normal range  Independent visualization of imaging: - I independently visualized the following imaging with scope of interpretation limited to determining acute life threatening conditions related to emergency care: CTAP, which revealed no acute abnormality to explain symptoms  Consultation: - Consulted or discussed management/test interpretation w/ external professional: N/A  Consideration for admission or further workup: Patient has no emergent conditions requiring admission or further work-up at this time and is stable for discharge home with primary care follow-up  Social Determinants of health: n/A    Amount and/or Complexity of Data Reviewed Labs: ordered. Radiology: ordered.  Risk Prescription drug management.          Final Clinical Impression(s) / ED Diagnoses Final diagnoses:  Right groin pain    Rx / DC Orders ED Discharge Orders          Ordered    ondansetron (ZOFRAN) 4 MG tablet  Every 6 hours        08/25/22 1518    cyclobenzaprine (FLEXERIL) 10 MG tablet  2 times daily PRN        08/25/22 1518              Elayne Snare K, DO 08/25/22 1520

## 2022-08-25 NOTE — ED Triage Notes (Signed)
Pt arrives to ED with c/o right groin pain that started suddenly today while patient was climbing up a hill.

## 2022-08-25 NOTE — Telephone Encounter (Signed)
Patient contacted the office and states he just wanted to let his doctor know that he was just in the emergency department. 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 Patient states he was put on morphine and was given nausea medication. Patient inquires if he needs to stop his Humira for any reason. Patient call back number is [phone]. Please advise.

## 2022-08-25 NOTE — ED Notes (Signed)
Patient transported to CT 

## 2022-08-25 NOTE — Progress Notes (Signed)
TB gold negative

## 2022-08-25 NOTE — Telephone Encounter (Signed)
Patient advised per Ladona Ridgel, Reviewed ED note and CT scan report.  Ok to continue humira as prescribed. Patient verbalized understanding.

## 2022-08-26 ENCOUNTER — Telehealth: Payer: Self-pay

## 2022-08-26 NOTE — Transitions of Care (Post Inpatient/ED Visit) (Signed)
08/26/2022  Name: Leon Mason MRN: [id number] DOB: [birth year]  Today's TOC FU Call Status: Today's TOC FU Call Status:: Successful TOC FU Call Competed TOC FU Call Complete Date: 08/26/22  Transition Care Management Follow-up Telephone Call Date of Discharge: 08/25/22 Discharge Facility: [facility] ([facility]) Type of Discharge: Emergency Department Reason for ED Visit: Other: (RLQ pain) How have you been since you were released from the hospital?: Better Any questions or concerns?: No  Items Reviewed: Did you receive and understand the discharge instructions provided?: Yes Medications obtained,verified, and reconciled?: Yes (Medications Reviewed) Any new allergies since your discharge?: No Dietary orders reviewed?: Yes Do you have support at home?: No  Medications Reviewed Today: Medications Reviewed Today     Reviewed by Karena Addison, LPN (Licensed Practical Nurse) on 08/26/22 at 1208  Med List Status: <None>   Medication Order Taking? Sig Documenting Provider Last Dose Status Informant  acetaminophen (TYLENOL) 500 MG tablet [id number] No Take 1 tablet (500 mg total) by mouth every 6 (six) hours as needed.  Patient not taking: Reported on 08/23/2022   Daryll Drown, NP Not Taking Active   Adalimumab (HUMIRA, 2 PEN,) 40 MG/0.4ML PNKT [id number]  INJECT 1 PEN SUBCUTANEOUSLY ONCE EVERY 14 DAYS Gearldine Bienenstock, PA-C  Active   cyclobenzaprine (FLEXERIL) 10 MG tablet [id number]  Take 1 tablet (10 mg total) by mouth 2 (two) times daily as needed for muscle spasms. Elayne Snare K, DO  Active   diclofenac Sodium (VOLTAREN) 1 % GEL [id number]  Apply 2 g topically 4 (four) times daily. Gearldine Bienenstock, PA-C  Active   fluticasone-salmeterol (ADVAIR DISKUS) 250-50 MCG/ACT AEPB [id number] No Inhale 1 puff into the lungs as needed. [provider] Taking Active   IBUPROFEN PO [id number] No Take by mouth as needed. [provider] Taking Active   imiquimod  (ALDARA) 5 % cream [id number] No Apply a thin layer 3 times per week (on alternate days) prior to bedtime; leave on skin for 6 to 10 hours, then remove with mild soap and water.  Max 16 weeks  Patient not taking: Reported on 08/04/2021   Delynn Flavin M, DO Not Taking Active   levocetirizine (XYZAL) 5 MG tablet [id number] No Take 1 tablet (5 mg total) by mouth every evening. For allergies. Use instead of Claritin  Patient not taking: Reported on 08/23/2022   Raliegh Ip, DO Not Taking Active   loratadine (CLARITIN) 10 MG tablet [id number] No Take 10 mg by mouth daily. [provider] Taking Active   Patient not taking:  Discontinued 02/17/19 0641          Med Note Katrinka Blazing, JEFFREY W   Sat Feb 17, 2019  4:29 AM)    ondansetron (ZOFRAN) 4 MG tablet [id number]  Take 1 tablet (4 mg total) by mouth every 6 (six) hours. Elayne Snare K, DO  Active   predniSONE (DELTASONE) 5 MG tablet [id number] No Take 4 tablets by mouth daily x2 days, 3 tablets daily x2 days, 2 tablets daily x2 days, 1 tablet daily x2 days.  Patient not taking: Reported on 08/23/2022   Gearldine Bienenstock, PA-C Not Taking Active   sertraline (ZOLOFT) 50 MG tablet [id number] No Take 1 tablet (50 mg total) by mouth daily. Mozingo, Thereasa Solo, NP Taking Active   VENTOLIN HFA 108 [phone] Base) MCG/ACT inhaler [id number] No Inhale 1 puff into the lungs daily. [provider] Taking Active  Home Care and Equipment/Supplies: Were Home Health Services Ordered?: NA Any new equipment or medical supplies ordered?: NA  Functional Questionnaire: Do you need assistance with bathing/showering or dressing?: No Do you need assistance with meal preparation?: No Do you need assistance with eating?: No Do you have difficulty maintaining continence: No Do you need assistance with getting out of bed/getting out of a chair/moving?: No Do you have difficulty managing or taking your medications?: No  Follow up  appointments reviewed: PCP Follow-up appointment confirmed?: No (no avail appt , sent message to staff to schedule) MD Provider Line Number:[phone] Given: No Specialist Hospital Follow-up appointment confirmed?: NA Do you need transportation to your follow-up appointment?: No Do you understand care options if your condition(s) worsen?: Yes-patient verbalized understanding    SIGNATURE Karena Addison, LPN [facility] Nurse Health Advisor Direct Dial [phone]

## 2022-08-27 ENCOUNTER — Encounter: Payer: Self-pay | Admitting: Family Medicine

## 2022-08-27 ENCOUNTER — Ambulatory Visit: Payer: Medicaid Other | Admitting: Family Medicine

## 2022-08-27 VITALS — BP 136/85 | HR 74 | Temp 98.5°F | Ht 74.0 in | Wt 314.0 lb

## 2022-08-27 DIAGNOSIS — J4540 Moderate persistent asthma, uncomplicated: Secondary | ICD-10-CM

## 2022-08-27 DIAGNOSIS — K4090 Unilateral inguinal hernia, without obstruction or gangrene, not specified as recurrent: Secondary | ICD-10-CM | POA: Diagnosis not present

## 2022-08-27 DIAGNOSIS — S76211D Strain of adductor muscle, fascia and tendon of right thigh, subsequent encounter: Secondary | ICD-10-CM | POA: Diagnosis not present

## 2022-08-27 DIAGNOSIS — L237 Allergic contact dermatitis due to plants, except food: Secondary | ICD-10-CM | POA: Diagnosis not present

## 2022-08-27 MED ORDER — FLUTICASONE-SALMETEROL 250-50 MCG/ACT IN AEPB: 1.0000 | INHALATION_SPRAY | Freq: Two times a day (BID) | RESPIRATORY_TRACT | 3 refills | Status: DC

## 2022-08-27 MED ORDER — TRIAMCINOLONE ACETONIDE 0.1 % EX CREA
1.0000 | TOPICAL_CREAM | Freq: Two times a day (BID) | CUTANEOUS | 0 refills | Status: AC | PRN
Start: 2022-08-27 — End: ?

## 2022-08-27 NOTE — Progress Notes (Signed)
Subjective: CC:ED follow up for groin pain PCP: Raliegh Ip, DO WUJ:[id number] A Leon Mason is a 32 y.o. male presenting to clinic today for:  1. Right groin pain He reports ongoing right-sided groin pain.  Flexeril minimally helpful.  He feels like this is getting worked out and he is out of work until Monday.  He thinks he just overdid it.  He is interested in seeing the urologist for the left-sided hernia that was found on CT.  Though he does not report any symptomology related to that.  He is working outside now.  Reports a rash on his left forearm that he thinks is poison ivy.  Needs refills on asthma medications because he has been primarily using the albuterol instead of the Advair  ROS: Per HPI  Allergies  Allergen Reactions   Other Hives and Shortness Of Breath    Dogs and Cats.   Dust Mite Extract    Pollen Extract    Past Medical History:  Diagnosis Date   Anxiety    Asthma    Bipolar 1 disorder (HCC)    Hallucinations    Schizoaffective disorder (HCC)    Schizophrenia (HCC)     Current Outpatient Medications:    acetaminophen (TYLENOL) 500 MG tablet, Take 1 tablet (500 mg total) by mouth every 6 (six) hours as needed. (Patient not taking: Reported on 08/23/2022), Disp: 30 tablet, Rfl: 0   Adalimumab (HUMIRA, 2 PEN,) 40 MG/0.4ML PNKT, INJECT 1 PEN SUBCUTANEOUSLY ONCE EVERY 14 DAYS, Disp: 6 each, Rfl: 0   cyclobenzaprine (FLEXERIL) 10 MG tablet, Take 1 tablet (10 mg total) by mouth 2 (two) times daily as needed for muscle spasms., Disp: 20 tablet, Rfl: 0   diclofenac Sodium (VOLTAREN) 1 % GEL, Apply 2 g topically 4 (four) times daily., Disp: 400 g, Rfl: 1   fluticasone-salmeterol (ADVAIR DISKUS) 250-50 MCG/ACT AEPB, Inhale 1 puff into the lungs as needed., Disp: , Rfl:    IBUPROFEN PO, Take by mouth as needed., Disp: , Rfl:    imiquimod (ALDARA) 5 % cream, Apply a thin layer 3 times per week (on alternate days) prior to bedtime; leave on skin for 6 to 10 hours,  then remove with mild soap and water.  Max 16 weeks (Patient not taking: Reported on 08/04/2021), Disp: 12 each, Rfl: 3   levocetirizine (XYZAL) 5 MG tablet, Take 1 tablet (5 mg total) by mouth every evening. For allergies. Use instead of Claritin (Patient not taking: Reported on 08/23/2022), Disp: 90 tablet, Rfl: 3   loratadine (CLARITIN) 10 MG tablet, Take 10 mg by mouth daily., Disp: , Rfl:    ondansetron (ZOFRAN) 4 MG tablet, Take 1 tablet (4 mg total) by mouth every 6 (six) hours., Disp: 12 tablet, Rfl: 0   predniSONE (DELTASONE) 5 MG tablet, Take 4 tablets by mouth daily x2 days, 3 tablets daily x2 days, 2 tablets daily x2 days, 1 tablet daily x2 days. (Patient not taking: Reported on 08/23/2022), Disp: 20 tablet, Rfl: 0   sertraline (ZOLOFT) 50 MG tablet, Take 1 tablet (50 mg total) by mouth daily., Disp: 90 tablet, Rfl: 3   VENTOLIN HFA 108 (90 Base) MCG/ACT inhaler, Inhale 1 puff into the lungs daily., Disp: , Rfl:  Social History   Socioeconomic History   Marital status: Single    Spouse name: Not on file   Number of children: Not on file   Years of education: Not on file   Highest education level: Not on file  Occupational History   Not on file  Tobacco Use   Smoking status: Former    Years: .5    Types: Cigarettes    Quit date: 05/28/2018    Years since quitting: 4.2    Passive exposure: Current   Smokeless tobacco: Never  Vaping Use   Vaping Use: Every day  Substance and Sexual Activity   Alcohol use: Yes    Comment: 66oz daily   Drug use: Not Currently    Types: Marijuana   Sexual activity: Not Currently  Other Topics Concern   Not on file  Social History Narrative   Not on file   Social Determinants of Health   Financial Resource Strain: Not on file  Food Insecurity: Not on file  Transportation Needs: Not on file  Physical Activity: Not on file  Stress: Not on file  Social Connections: Not on file  Intimate Partner Violence: Not on file   Family History   Problem Relation Age of Onset   Bipolar disorder Mother    Alcoholism Mother    Diabetes Mother    Cervical cancer Mother    OCD Father    Bipolar disorder Father    Eczema Brother    Post-traumatic stress disorder Brother    COPD Maternal Aunt    Heart attack Maternal Grandmother    Breast cancer Maternal Grandmother    Heart attack Maternal Grandfather    Cancer Paternal Grandmother    Heart attack Paternal Grandfather    Asthma Neg Hx    Allergic rhinitis Neg Hx    Angioedema Neg Hx    Urticaria Neg Hx    Immunodeficiency Neg Hx    Atopy Neg Hx    Prostate cancer Neg Hx    Colon cancer Neg Hx     Objective: Office vital signs reviewed. BP 136/85   Pulse 74   Temp 98.5 F (36.9 C)   Ht 6\' 2"  (1.88 m)   Wt (!) 314 lb (142.4 kg)   SpO2 96%   BMI 40.32 kg/m   Physical Examination:  General: Awake, alert, well nourished, No acute distress HEENT: sclera white, MMM Cardio: regular rate and rhythm, S1S2 heard, no murmurs appreciated Pulm: clear to auscultation bilaterally, no wheezes, rhonchi or rales; normal work of breathing on room air Skin: Patchy, maculopapular rash that is erythematous along the left forearm MSK: Antalgic gait  CT ABDOMEN PELVIS W CONTRAST  Result Date: 08/25/2022 CLINICAL DATA:  Right lower quadrant abdominal pain starting today EXAM: CT ABDOMEN AND PELVIS WITH CONTRAST TECHNIQUE: Multidetector CT imaging of the abdomen and pelvis was performed using the standard protocol following bolus administration of intravenous contrast. RADIATION DOSE REDUCTION: This exam was performed according to the departmental dose-optimization program which includes automated exposure control, adjustment of the mA and/or kV according to patient size and/or use of iterative reconstruction technique. CONTRAST:  OMNIPAQUE IOHEXOL 300 MG/ML  SOLN COMPARISON:  None Available. FINDINGS: Lower chest: Unremarkable Hepatobiliary: Unremarkable Pancreas: Unremarkable Spleen:  Unremarkable Adrenals/Urinary Tract: There is early contrast excretion in the collecting systems and ureters. The kidneys, adrenal glands, an urinary bladder appear unremarkable. Stomach/Bowel: Unremarkable.  Normal appendix. Vascular/Lymphatic: Unremarkable Reproductive: Unremarkable Other: No supplemental non-categorized findings. Musculoskeletal: Small indirect left inguinal hernia containing adipose tissue. IMPRESSION: 1. No specific abnormality is identified to explain the patient's right lower quadrant pain. The appendix appears normal. 2. Small indirect left inguinal hernia containing adipose tissue. Electronically Signed   By: Gaylyn Rong M.D.   On:  08/25/2022 14:55     Assessment/ Plan: 32 y.o. male   Inguinal strain, right, subsequent encounter  Poison ivy dermatitis - Plan: triamcinolone cream (KENALOG) 0.1 %  Moderate persistent asthma without complication - Plan: fluticasone-salmeterol (ADVAIR DISKUS) 250-50 MCG/ACT AEPB  Left inguinal hernia - Plan: Ambulatory referral to Urology  Continue current regimen as outlined by ER.  Referral to urology for left-sided inguinal hernia that is currently asymptomatic.  We discussed red flag signs symptoms warranting further evaluation emergently however  I have given him triamcinolone cream for what appears to be poison ivy dermatitis on left upper extremity  Advair renewed  No orders of the defined types were placed in this encounter.  No orders of the defined types were placed in this encounter.    Raliegh Ip, DO [facility] [phone]

## 2022-09-02 ENCOUNTER — Other Ambulatory Visit (HOSPITAL_COMMUNITY): Payer: Self-pay

## 2022-09-06 ENCOUNTER — Telehealth: Payer: Self-pay

## 2022-09-06 DIAGNOSIS — J4540 Moderate persistent asthma, uncomplicated: Secondary | ICD-10-CM

## 2022-09-06 NOTE — Telephone Encounter (Signed)
Generic Advair is not covered by patient's insurance, covered alternatives are:  Brand name only Advair Diskus Brand name only Advair HFA inhaler [facility] Inhaler Symbicort inhaler

## 2022-09-07 MED ORDER — ADVAIR DISKUS 250-50 MCG/ACT IN AEPB
1.0000 | INHALATION_SPRAY | Freq: Two times a day (BID) | RESPIRATORY_TRACT | 3 refills | Status: DC
Start: 2022-09-07 — End: 2022-11-01

## 2022-09-07 MED ORDER — FLUTICASONE-SALMETEROL 250-50 MCG/ACT IN AEPB
1.0000 | INHALATION_SPRAY | Freq: Two times a day (BID) | RESPIRATORY_TRACT | 3 refills | Status: DC
Start: 2022-09-07 — End: 2022-09-07

## 2022-09-07 NOTE — Telephone Encounter (Signed)
Rx for brand name advair diskus sent.

## 2022-09-22 ENCOUNTER — Ambulatory Visit (INDEPENDENT_AMBULATORY_CARE_PROVIDER_SITE_OTHER): Payer: MEDICAID | Admitting: Family Medicine

## 2022-09-22 VITALS — BP 134/82 | HR 79 | Temp 98.3°F | Ht 74.0 in | Wt 322.0 lb

## 2022-09-22 DIAGNOSIS — F200 Paranoid schizophrenia: Secondary | ICD-10-CM | POA: Diagnosis not present

## 2022-09-22 DIAGNOSIS — E66812 Obesity, class 2: Secondary | ICD-10-CM

## 2022-09-22 DIAGNOSIS — E6609 Other obesity due to excess calories: Secondary | ICD-10-CM

## 2022-09-22 DIAGNOSIS — Z6379 Other stressful life events affecting family and household: Secondary | ICD-10-CM

## 2022-09-22 DIAGNOSIS — Z6835 Body mass index (BMI) 35.0-35.9, adult: Secondary | ICD-10-CM

## 2022-09-22 DIAGNOSIS — L600 Ingrowing nail: Secondary | ICD-10-CM | POA: Diagnosis not present

## 2022-09-22 DIAGNOSIS — F1010 Alcohol abuse, uncomplicated: Secondary | ICD-10-CM | POA: Diagnosis not present

## 2022-09-22 DIAGNOSIS — R635 Abnormal weight gain: Secondary | ICD-10-CM

## 2022-09-22 LAB — BAYER DCA HB A1C WAIVED: HB A1C (BAYER DCA - WAIVED): 5 % (ref 4.8–5.6)

## 2022-09-22 NOTE — Patient Instructions (Addendum)
New referral to psychiatry placed. Leon Mason should call for appointment for counseling services   National Suicide Hotline: 4244444376 [facility] Crisis Line: 210-143-2133 Crisis Recovery in [place]: (306)711-7587 [facility]izophren[facility] hotline: 618-120-4710 New location for [facility] in [place]: Address: [address], [place], [place] [address] Phone: [phone]   These are available 24 hours a day, 7 days a week.  Alcohol Use Disorder Alcohol use disorder is a condition in which drinking disrupts daily life. People with this condition drink too much alcohol and cannot control their drinking. Alcohol use disorder can cause serious problems with physical health. It can affect the brain, heart, and other internal organs. This disorder can raise the risk for certain cancers and cause problems with mental health, such as depression or anxiety. What are the causes? This condition is caused by drinking too much alcohol over time. Some people with this condition drink to cope with or escape from negative life events. Others drink to relieve symptoms of physical pain or symptoms of mental illness. What increases the risk? You are more likely to develop this condition if: You have a family history of alcohol use disorder. Your culture encourages drinking to the point of becoming drunk (intoxication). You had a mood or conduct disorder in childhood. You have been abused. You are an adolescent and you: Have poor performance in school. Have poor supervision or guidance. Act on impulse and like taking risks. What are the signs or symptoms? Symptoms of this condition include: Drinking more than you want to. Trying several times without success to drink less. Spending a lot of time thinking about alcohol, getting alcohol, drinking alcohol, or recovering from drinking alcohol. Continuing to drink even when it is causing serious problems in your daily  life. Drinking when it is dangerous to drink, such as before driving a car. Needing more and more alcohol to get the same effect you want (building up tolerance). Having symptoms of withdrawal when you stop drinking. Withdrawal symptoms may include: Trouble sleeping, leading to tiredness (fatigue). Mood swings of depression and anxiety. Physical symptoms, such as a fast heart rate, rapid breathing, high blood pressure (hypertension), fever, cold sweats, or nausea. Seizures. Severe confusion. Feeling or seeing things that are not there (hallucinations). Shaking movements that you cannot control (tremors). How is this diagnosed? This condition is diagnosed with an assessment. Your health care provider may start by asking three or four questions about your drinking, or they may give you a simple test to take. This helps to get clear information from you. You may also have a physical exam or lab tests. You may be referred to a substance abuse counselor. How is this treated? With education, some people with alcohol use disorder are able to reduce their drinking. Many with this disorder cannot change their drinking behavior on their own and need help with treatment from substance use specialists. Treatments may include: Detoxification. Detoxification involves quitting drinking with supervision and direction of health care providers. Your health care provider may prescribe medicines within the first week to help lessen withdrawal symptoms. Alcohol withdrawal can be dangerous and life-threatening. Detoxification may be provided in a home, community, or primary care setting, or in a hospital or substance use treatment facility. Counseling. This may involve motivational interviewing (MI), family therapy, or cognitive behavioral therapy (CBT). A counselor can address the things you can do to change your drinking behavior and how to maintain the changes. Talk therapy aims to: Identify your positive motivations  to change. Identify and avoid the things that trigger your drinking. Help you learn how to plan your behavior change. Develop support systems that can help you sustain the change. Medicines. Medicines can help treat this disorder by: Decreasing cravings. Decreasing the positive feeling you have when you drink. Causing an uncomfortable physical reaction when you drink (aversion therapy). Support groups such as Alcoholics Anonymous (AA). These groups are led by people who have quit drinking. The groups provide emotional support, advice, and guidance. Some people with this condition benefit from a combination of treatments provided by specialized substance use treatment centers. Follow these instructions at home:  Medicines Take over-the-counter and prescription medicines only as told by your health care provider. Ask before starting any new medicines, herbs, or supplements. General instructions Ask friends and family members to support your choice to stay sober. Avoid places where alcohol is served. Create a plan to deal with tempting situations. Attend support groups regularly. Practice hobbies or activities you enjoy. Do not drink and drive. How is this prevented? Do not drink alcohol if your health care provider tells you not to drink. If you drink alcohol: Limit how much you have to: 0-1 drink a day for women who are not pregnant. 0-2 drinks a day for men. Know how much alcohol is in your drink. In the [place] one drink equals one 12 oz bottle of beer (355 mL), one 5 oz glass of wine (148 mL), or one 1 oz glass of hard liquor (44 mL). If you have a mental health condition, seek treatment. Develop a healthy lifestyle through: Meditation or deep breathing. Exercise. Spending time in nature. Listening to music. Talking with a trusted friend or family member. If you are a teen: Do not drink alcohol. Avoid gatherings where you might be tempted to drink alcohol. Do not be afraid to  say no if someone offers you alcohol. Speak up about why you do not want to drink. Set a positive example for others around you by not drinking. Build relationships with friends who do not drink. Where to find more information Substance Abuse and Mental Health Services Administration: RockToxic.pl Alcoholics Anonymous: CustomizedRugs.fi Contact a health care provider if: You cannot take your medicines as told. Your symptoms get worse or you experience symptoms of withdrawal when you stop drinking. You start drinking again (relapse) and your symptoms get worse. Get help right away if: You have thoughts about hurting yourself or others. Get help right away if you feel like you may hurt yourself or others, or have thoughts about taking your own life. Go to your nearest emergency room or: Call 911. Call the National Suicide Prevention Lifeline at 870-356-2586 or 988. This is open 24 hours a day. Text the Crisis Text Line at 4580428233. Summary Alcohol use disorder is a condition in which drinking disrupts daily life. People with this condition drink too much alcohol and cannot control their drinking. Treatment may include detoxification, counseling, medicines, and support groups. Ask friends and family members to support you. Avoid situations where alcohol is served. Get help right away if you have thoughts about hurting yourself or others. This information is not intended to replace advice given to you by your health care provider. Make sure you discuss any questions you have with your health care provider. Document Revised: 04/29/2021 Document Reviewed: 04/29/2021 Elsevier Patient Education  2024 [employer].

## 2022-09-23 ENCOUNTER — Encounter: Payer: Self-pay | Admitting: Family Medicine

## 2022-09-23 LAB — CBC
Hematocrit: 43.5 % (ref 37.5–51.0)
Hemoglobin: 14.8 g/dL (ref 13.0–17.7)
MCH: 31.8 pg (ref 26.6–33.0)
MCHC: 34 g/dL (ref 31.5–35.7)
MCV: 94 fL (ref 79–97)
Platelets: 181 10*3/uL (ref 150–450)
RBC: 4.65 x10E6/uL (ref 4.14–5.80)
RDW: 14.3 % (ref 11.6–15.4)
WBC: 5.9 10*3/uL (ref 3.4–10.8)

## 2022-09-23 LAB — CMP14+EGFR
ALT: 54 IU/L — ABNORMAL HIGH (ref 0–44)
AST: 41 IU/L — ABNORMAL HIGH (ref 0–40)
Albumin: 4.6 g/dL (ref 4.1–5.1)
Alkaline Phosphatase: 69 IU/L (ref 44–121)
BUN/Creatinine Ratio: 16 (ref 9–20)
BUN: 15 mg/dL (ref 6–20)
Bilirubin Total: 0.4 mg/dL (ref 0.0–1.2)
CO2: 23 mmol/L (ref 20–29)
Calcium: 9.7 mg/dL (ref 8.7–10.2)
Chloride: 99 mmol/L (ref 96–106)
Creatinine, Ser: 0.96 mg/dL (ref 0.76–1.27)
Globulin, Total: 3 g/dL (ref 1.5–4.5)
Glucose: 86 mg/dL (ref 70–99)
Potassium: 4.3 mmol/L (ref 3.5–5.2)
Sodium: 138 mmol/L (ref 134–144)
Total Protein: 7.6 g/dL (ref 6.0–8.5)
eGFR: 108 mL/min/{1.73_m2} (ref >59)

## 2022-09-23 LAB — TSH: TSH: 0.97 u[IU]/mL (ref 0.450–4.500)

## 2022-09-23 LAB — T4, FREE: Free T4: 1.11 ng/dL (ref 0.82–1.77)

## 2022-09-23 MED ORDER — FOLIC ACID 1 MG PO TABS
1.0000 mg | ORAL_TABLET | Freq: Every day | ORAL | 1 refills | Status: AC
Start: 2022-09-23 — End: ?

## 2022-09-23 MED ORDER — THIAMINE HCL 100 MG PO TABS
100.0000 mg | ORAL_TABLET | Freq: Every day | ORAL | 1 refills | Status: AC
Start: 2022-09-23 — End: ?

## 2022-09-23 NOTE — Progress Notes (Signed)
Subjective: CC: Personal PCP: Raliegh Ip, DO ZOX:[id number] Leon Mason is Leon 32 y.o. male presenting to clinic today for:  1.  Stress at home, history of schizophrenia/ingrown toenails Patient was under the care of of [facility] at [place] but because his insurance is now [place], he cannot see them anymore because they do not accept this.  He subsequently has been between specialists.  He reports increased stress at home because his mother is Leon Chartered loss adjuster and he essentially has to help her quite Leon bit.  He also has Leon brother, who has also significant mental health issues and does not work.  He feels like Leon primary caregiver there and notes that the financial struggle is quite burdensome.  He subsequently drinks beer every day and reports up to 80 ounces in the day.  He directly identifies anxiety and depression as causative.  He is not eating well.  He is gaining weight and has gained 8 pounds since his last visit here.  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    In fact he reports to me that he has some ingrown toenails that he needs to be referred back to his podiatrist for today.  Denies any purulence, discoloration but does note tenderness.  He is interested in seeing Arlys John here in the office for now for counseling services   ROS: Per HPI  Allergies  Allergen Reactions   Other Hives and Shortness Of Breath    Dogs and Cats.   Dust Mite Extract    Pollen Extract    Past Medical History:  Diagnosis Date   Anxiety    Asthma    Bipolar 1 disorder (HCC)    Hallucinations    Schizoaffective disorder (HCC)    Schizophrenia (HCC)     Current Outpatient Medications:    acetaminophen (TYLENOL) 500 MG tablet, Take 1 tablet (500 mg total) by mouth every 6 (six) hours as needed., Disp: 30 tablet, Rfl: 0   Adalimumab (HUMIRA, 2 PEN,) 40 MG/0.4ML PNKT, INJECT 1 PEN SUBCUTANEOUSLY ONCE EVERY 14 DAYS, Disp: 6 each, Rfl: 0   ADVAIR DISKUS  250-50 MCG/ACT AEPB, Inhale 1 puff into the lungs in the morning and at bedtime., Disp: 180 each, Rfl: 3   cyclobenzaprine (FLEXERIL) 10 MG tablet, Take 1 tablet (10 mg total) by mouth 2 (two) times daily as needed for muscle spasms., Disp: 20 tablet, Rfl: 0   diclofenac Sodium (VOLTAREN) 1 % GEL, Apply 2 g topically 4 (four) times daily., Disp: 400 g, Rfl: 1   IBUPROFEN PO, Take by mouth as needed., Disp: , Rfl:    levocetirizine (XYZAL) 5 MG tablet, Take 1 tablet (5 mg total) by mouth every evening. For allergies. Use instead of Claritin, Disp: 90 tablet, Rfl: 3   ondansetron (ZOFRAN) 4 MG tablet, Take 1 tablet (4 mg total) by mouth every 6 (six) hours., Disp: 12 tablet, Rfl: 0   sertraline (ZOLOFT) 50 MG tablet, Take 1 tablet (50 mg total) by mouth daily., Disp: 90 tablet, Rfl: 3   triamcinolone cream (KENALOG) 0.1 %, Apply 1 Application topically 2 (two) times daily as needed (poison oak/ivy rash (max 10 days per use))., Disp: 80 g, Rfl: 0   VENTOLIN HFA 108 (90 Base) MCG/ACT inhaler, Inhale 1 puff into the lungs daily., Disp: , Rfl:  Social History   Socioeconomic History   Marital status: Single    Spouse name: Not on file   Number  of children: Not on file   Years of education: Not on file   Highest education level: Not on file  Occupational History   Not on file  Tobacco Use   Smoking status: Former    Current packs/day: 0.00    Types: Cigarettes    Start date: 11/27/2017    Quit date: 05/28/2018    Years since quitting: 4.3    Passive exposure: Current   Smokeless tobacco: Never  Vaping Use   Vaping status: Every Day  Substance and Sexual Activity   Alcohol use: Yes    Comment: 66oz daily   Drug use: Not Currently    Types: Marijuana   Sexual activity: Not Currently  Other Topics Concern   Not on file  Social History Narrative   Not on file   Social Determinants of Health   Financial Resource Strain: Not on file  Food Insecurity: Food Insecurity Present (08/12/2020)    [facility]   Hunger Vital Sign    Worried About Running Out of Food in the Last Year: Often true    Ran Out of Food in the Last Year: Sometimes true  Transportation Needs: Not on file  Physical Activity: Not on file  Stress: Not on file  Social Connections: Unknown (07/21/2021)   Received from [facility]   Social Network    Social Network: Not on file  Intimate Partner Violence: Unknown (06/12/2021)   Received from [facility]   HITS    Physically Hurt: Not on file    Insult or Talk Down To: Not on file    Threaten Physical Harm: Not on file    Scream or Curse: Not on file   Family History  Problem Relation Age of Onset   Bipolar disorder Mother    Alcoholism Mother    Diabetes Mother    Cervical cancer Mother    OCD Father    Bipolar disorder Father    Eczema Brother    Post-traumatic stress disorder Brother    COPD Maternal Aunt    Heart attack Maternal Grandmother    Breast cancer Maternal Grandmother    Heart attack Maternal Grandfather    Cancer Paternal Grandmother    Heart attack Paternal Grandfather    Asthma Neg Hx    Allergic rhinitis Neg Hx    Angioedema Neg Hx    Urticaria Neg Hx    Immunodeficiency Neg Hx    Atopy Neg Hx    Prostate cancer Neg Hx    Colon cancer Neg Hx     Objective: Office vital signs reviewed. BP 134/82   Pulse 79   Temp 98.3 F (36.8 C)   Ht 6\' 2"  (1.88 m)   Wt (!) 322 lb (146.1 kg)   SpO2 98%   BMI 41.34 kg/m   Physical Examination:  General: Awake, alert, obese, No acute distress HEENT: No thyromegaly.  No goiter.  No exophthalmos Cardio: regular rate and rhythm, S1S2 heard, no murmurs appreciated Pulm: clear to auscultation bilaterally, no wheezes, rhonchi or rales; normal work of breathing on room air MSK: Normal gait and station.  Has ingrown toenails appreciated medially bilaterally.  Right toenail with evidence of hemostatic bleed but no active purulence, significant warmth or erythema. Neuro: No  tremor Psych: Visibly depressed     09/22/2022    2:35 PM 06/15/2021    2:17 PM 05/29/2021   10:51 AM  Depression screen PHQ 2/9  Decreased Interest 2 2 1   Down, Depressed,  Hopeless 1 1 1   PHQ - 2 Score 3 3 2   Altered sleeping 0 1 2  Tired, decreased energy 1 0 0  Change in appetite 2 2 1   Feeling bad or failure about yourself  2 0 2  Trouble concentrating 0 0 0  Moving slowly or fidgety/restless 0 0 0  Suicidal thoughts 1 0 0  PHQ-9 Score 9 6 7   Difficult doing work/chores Very difficult  Somewhat difficult      09/22/2022    2:35 PM 06/15/2021    2:17 PM 05/29/2021   10:51 AM 02/16/2021   11:13 AM  GAD 7 : Generalized Anxiety Score  Nervous, Anxious, on Edge 2 1 2 3   Control/stop worrying 2 1 1 2   Worry too much - different things 2 1 1 3   Trouble relaxing 2 1 1 3   Restless 1 0 0 0  Easily annoyed or irritable 2 2 2 2   Afraid - awful might happen 2 2 1 3   Total GAD 7 Score 13 8 8 16   Anxiety Difficulty Somewhat difficult  Somewhat difficult Very difficult    Assessment/ Plan: 32 y.o. male   Ingrown toenail of both feet - Plan: Ambulatory referral to Podiatry, CBC  Stress due to illness of family member - Plan: Ambulatory referral to Psychiatry, Ambulatory referral to Integrated Behavioral Health  Paranoid schizophrenia (HCC) - Plan: Ambulatory referral to Psychiatry, CMP14+EGFR  Excessive drinking alcohol - Plan: folic acid (FOLVITE) 1 MG tablet, thiamine (VITAMIN B1) 100 MG tablet  Weight gain - Plan: Bayer DCA Hb A1c Waived, TSH, T4, Free, CMP14+EGFR  Class 2 obesity due to excess calories with body mass index (BMI) of 35.0 to 35.9 in adult, unspecified whether serious comorbidity present - Plan: Bayer DCA Hb A1c Waived, TSH, T4, Free, CMP14+EGFR  I have referred him to podiatry.  Not sure if we can get him back into the when he was seen before as I could not find who he was seeing and furthermore not sure if they will accept his current insurance.  His ingrown  toenails did not appear infected today but we discussed that if they do start demonstrating purulence or other signs of infection he is to message me immediately and I will start antibiotics without an appointment  I referred him to our counselors here in office.  He certainly seems like he is developing substance use disorder with the alcohol and I did discuss consideration for [facility] in [place].  He is extremely reluctant to see [facility] because of his poor experience with the 1 in [place] in the past.  I still gave him their information as well as several other numbers including Leon schizophrenia hotline if he needs it before he is able to establish with Leon new psychiatrist.  He has sufficient amounts of medication from his old psychiatrist and feels like he could call them for refills if needed until he establishes with Leon new specialist.  I suspect weight gain is secondary to use of alcohol.  I am going to go ahead and send over Leon folic acid supplement given excessive consumption I would also like him to start thiamine.  I think that Leon gradual reduction will be needed but it sounds like he would likely not be able to do this until he can get better control of what sounds like some reactive stress.  Will evaluate for any metabolic etiology of weight gain and/or complications of the weight gain through blood work  Patient and/or  legal guardian verbally consented to [facility] services about presenting concerns and psychiatric consultation as appropriate.  The services will be billed as appropriate for the patient   Orders Placed This Encounter  Procedures   Bayer DCA Hb A1c Waived   TSH   T4, Free   CMP14+EGFR   CBC   Ambulatory referral to Podiatry    Referral Priority:   Routine    Referral Type:   Consultation    Referral Reason:   Specialty Services Required    Requested Specialty:   Podiatry    Number of Visits Requested:   1   Ambulatory referral to  Psychiatry    Referral Priority:   Routine    Referral Type:   Psychiatric    Referral Reason:   Specialty Services Required    Requested Specialty:   Psychiatry    Number of Visits Requested:   1   Ambulatory referral to Integrated Behavioral Health    Referral Priority:   Routine    Referral Type:   Consultation    Referral Reason:   Specialty Services Required    Number of Visits Requested:   1   No orders of the defined types were placed in this encounter.    Raliegh Ip, DO [facility] [phone]

## 2022-10-18 NOTE — Progress Notes (Unsigned)
Office Visit Note  Patient: Leon Mason             Date of Birth: [birth year]           MRN: [id number]             PCP: Raliegh Ip, DO Referring: Raliegh Ip, DO Visit Date: 11/01/2022 Occupation: @GUAROCC @  Subjective:  Pain in both hands  History of Present Illness: Leon Mason is a 32 y.o. male with history of spondyloarthropathy and iritis.  Patient remains on Humira 40 mg sq injections every 14 days-initiated Humira on 12/26/20. His last dose of humira was on Thursday.  He denies any gaps in therapy for the past 2 months.  He denies any recent or recurrent infections.  Patient reports that he has been trying to avoid over-the-counter products for pain relief.  He states that he has been under increased family stress recently which she feels contributes to some of his flares.  He states he has been having right eye pain intermittently but denies any photophobia at this time.  He continues to work Psychologist, clinical and has been having some increased arthralgias and joint stiffness.  He has soreness and stiffness in both hands.  He requested a refill of Voltaren gel.  Patient states that he has focusing on quitting drinking.  He states he had 1 beer yesterday and before that had not had a beer in 1 month.  He states he has had some increased weight gain which he attributes to dietary changes.  Activities of Daily Living:  Patient reports morning stiffness for 30 minutes.   Patient Denies nocturnal pain.  Difficulty dressing/grooming: Denies Difficulty climbing stairs: Denies Difficulty getting out of chair: Denies Difficulty using hands for taps, buttons, cutlery, and/or writing: Reports  Review of Systems  Constitutional:  Negative for fatigue.  HENT:  Negative for mouth sores and mouth dryness.   Eyes:  Positive for pain, discharge and redness. Negative for dryness.  Respiratory:  Negative for shortness of breath.   Cardiovascular:  Positive for chest pain.  Negative for palpitations.  Gastrointestinal:  Negative for blood in stool, constipation and diarrhea.  Endocrine: Negative for increased urination.  Genitourinary:  Negative for involuntary urination.  Musculoskeletal:  Positive for joint pain, gait problem, joint pain and morning stiffness. Negative for joint swelling, myalgias, muscle weakness, muscle tenderness and myalgias.  Skin:  Positive for ulcers and sensitivity to sunlight. Negative for color change, rash and hair loss.  Allergic/Immunologic: Negative for susceptible to infections.  Neurological:  Positive for numbness and parasthesias. Negative for dizziness and headaches.  Hematological:  Negative for swollen glands.  Psychiatric/Behavioral:  Positive for depressed mood. Negative for sleep disturbance. The patient is nervous/anxious.     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   Back pain without sciatica 02/16/2021   Neck pain 02/16/2021   Generalized joint pain 10/21/2020   Generalized abdominal pain 10/21/2020   Mild intermittent asthma with (acute) exacerbation 02/18/2019   Hypokalemia 02/18/2019   Normocytic anemia 02/18/2019   Elevated transaminase level 02/18/2019   Prolonged QT interval 02/18/2019   Viral syndrome 02/18/2019   SIRS (systemic inflammatory response syndrome) (HCC) 02/18/2019   Globus sensation 07/04/2018   Schizophrenia (HCC) 03/28/2018   MDD (major depressive disorder), recurrent episode, severe (HCC) 03/28/2018   Schizo-affective schizophrenia, chronic condition with acute exacerbation (HCC) 03/28/2018   Tobacco use disorder 12/17/2016   Schizoaffective disorder,  bipolar type (HCC) 08/23/2016   Alcohol use disorder, severe, dependence (HCC) 08/23/2016   Allergic rhinitis due to pollen 10/09/2015   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   Mild persistent allergic asthma 10/09/2015   Psychosis (HCC) 06/14/2013    Past Medical History:   Diagnosis Date   Anxiety    Asthma    Bipolar 1 disorder (HCC)    Hallucinations    Schizoaffective disorder (HCC)    Schizophrenia (HCC)     Family History  Problem Relation Age of Onset   Bipolar disorder Mother    Alcoholism Mother    Diabetes Mother    Cervical cancer Mother    OCD Father    Bipolar disorder Father    Eczema Brother    Post-traumatic stress disorder Brother    COPD Maternal Aunt    Heart attack Maternal Grandmother    Breast cancer Maternal Grandmother    Heart attack Maternal Grandfather    Cancer Paternal Grandmother    Heart attack Paternal Grandfather    Asthma Neg Hx    Allergic rhinitis Neg Hx    Angioedema Neg Hx    Urticaria Neg Hx    Immunodeficiency Neg Hx    Atopy Neg Hx    Prostate cancer Neg Hx    Colon cancer Neg Hx    Past Surgical History:  Procedure Laterality Date   ingrown toenail Right 10/21/2022   great toe   WISDOM TOOTH EXTRACTION  2009   all four   Social History   Social History Narrative   Not on file   Immunization History  Administered Date(s) Administered   DTaP 01/03/1990, 03/17/1990, 06/08/1990, 02/20/1991, 06/01/1994   HIB (PRP-OMP) 01/03/1990, 03/17/1990, 06/08/1990, 02/20/1991   Hepatitis B 01/05/2001, 02/01/2001, 05/04/2005   Hepatitis B, PED/ADOLESCENT 01/05/2001, 02/01/2001, 05/04/2005   IPV 01/03/1990, 03/17/1990, 02/20/1991, 06/01/1994   Influenza,inj,Quad PF,6+ Mos 02/20/2019, 02/16/2021   Influenza,inj,quad, With Preservative 03/18/2018   Influenza-Unspecified 03/18/2018   MMR 02/20/1991, 06/01/1994   Meningococcal Conjugate 05/04/2005   Pneumococcal Polysaccharide-23 08/29/2016, 02/20/2019   Tdap 05/04/2005, 12/26/2019     Objective: Vital Signs: BP 139/82 (BP Location: Left Arm, Patient Position: Sitting, Cuff Size: Large)   Pulse 83   Resp 18   Ht 6\' 3"  (1.905 m)   Wt (!) 317 lb 3.2 oz (143.9 kg)   BMI 39.65 kg/m    Physical Exam Vitals and nursing note reviewed.  Constitutional:       Appearance: He is well-developed.  HENT:     Head: Normocephalic and atraumatic.  Eyes:     Conjunctiva/sclera: Conjunctivae normal.     Pupils: Pupils are equal, round, and reactive to light.  Cardiovascular:     Rate and Rhythm: Normal rate and regular rhythm.     Heart sounds: Normal heart sounds.  Pulmonary:     Effort: Pulmonary effort is normal.     Breath sounds: Normal breath sounds.  Abdominal:     General: Bowel sounds are normal.     Palpations: Abdomen is soft.  Musculoskeletal:     Cervical back: Normal range of motion and neck supple.  Skin:    General: Skin is warm and dry.     Capillary Refill: Capillary refill takes less than 2 seconds.  Neurological:     Mental Status: He is alert and oriented to person, place, and time.  Psychiatric:        Behavior: Behavior normal.      Musculoskeletal Exam: C-spine, thoracic spine, lumbar spine good  range of motion.  No midline spinal tenderness.  No SI joint tenderness upon palpation.  Shoulder joints, elbow joints, wrist joints, MCPs, PIPs, DIPs have good range of motion with no synovitis.  Complete fist formation bilaterally.  Hip joints have good range of motion with no groin pain.  Knee joints have good range of motion with no warmth or effusion.  Ankle joints have good range of motion with no tenderness or joint swelling.  No evidence of Achilles tendinitis or plantar fasciitis.  CDAI Exam: CDAI Score: -- Patient Global: --; Provider Global: -- Swollen: --; Tender: -- Joint Exam 11/01/2022   No joint exam has been documented for this visit   There is currently no information documented on the homunculus. Go to the Rheumatology activity and complete the homunculus joint exam.  Investigation: No additional findings.  Imaging: No results found.  Recent Labs: Lab Results  Component Value Date   WBC 5.9 09/22/2022   HGB 14.8 09/22/2022   PLT 181 09/22/2022   NA 138 09/22/2022   K 4.3 09/22/2022   CL 99  09/22/2022   CO2 23 09/22/2022   GLUCOSE 86 09/22/2022   BUN 15 09/22/2022   CREATININE 0.96 09/22/2022   BILITOT 0.4 09/22/2022   ALKPHOS 69 09/22/2022   AST 41 (H) 09/22/2022   ALT 54 (H) 09/22/2022   PROT 7.6 09/22/2022   ALBUMIN 4.6 09/22/2022   CALCIUM 9.7 09/22/2022   GFRAA >60 02/19/2019   QFTBGOLDPLUS NEGATIVE 08/23/2022    Speciality Comments: Humira started December 26, 2020  Procedures:  No procedures performed Allergies: Other, Dust mite extract, and Pollen extract   Assessment / Plan:     Visit Diagnoses: Spondyloarthropathy - Inflammatory arthritis, iritis, chronic SI joint pain, chronic pain in neck, thoracic and lumbar region.  HLA-B27 positive: He has no synovitis or dactylitis on examination today.  No SI joint tenderness upon palpation.  He experiences intermittent pain and stiffness in the C-spine and both hands but overall his symptoms have been tolerable.  He remains on Humira 40 mg sq injections every 14 days.  He continues to tolerate Humira without any side effects or injection site reactions.  He has not had a gap in therapy in over 2 months.  He has been avoiding the use of over-the-counter products.  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  He requested a refill of Voltaren gel to be sent to the pharmacy today. Discussed the importance of regular exercise, good sleep hygiene, and stress management. He will remain on Humira as prescribed.  He was advised to notify us if he develop signs or symptoms of a flare.  He will follow-up in the office in 3 months or sooner if needed.  High risk medication use - Humira 40 mg sq injections every 14 days-initiated Humira on 12/26/20. CBC and CMP updated on 09/22/22. AST 41 and ALT 54.  He is due for upcoming lab work in October and every 3 months.   TB gold negative on 08/23/22.  No recent or recurrent infections.  Discussed the importance of  holding humira if he develops signs or symptoms of an infection and to resume once the infection has completely cleared.  Discussed the importance of yearly skin examinations due to the risk for nonmelanoma skin cancers in patients on Humira.  A referral to dermatology will place today.  HLA B27 positive  Iritis: Intermittent right eye pain.  No photophobia.  Discussed the importance of stress management.  He will remain on Humira as prescribed.  Plan to avoid methotrexate use due to history of elevated LFTs/history of alcohol use disorder.    Entrapment of left ulnar nerve - Evaluated by Dr. August Saucer. NCV with EMG on 10/13/21.  Chronic pain of left knee: No warmth or effusion noted today.    DDD (degenerative disc disease), cervical: C-spine has good ROM on examination today.  No symptoms of radiculopathy.    Pain in thoracic spine: No midline spinal tenderness.    Chronic SI joint pain: No SI joint tenderness upon palpation today.   Other fatigue: Stable.  Patient has been working a physically demanding job.   Other medical conditions are listed as follows:  Schizoaffective disorder, bipolar type (HCC)  SIRS (systemic inflammatory response syndrome) (HCC)  Prolonged QT interval  Palpitations  Mild persistent allergic asthma  Alcohol use disorder, severe, dependence (HCC)  Smoker  Allergic urticaria  Orders: No orders of the defined types were placed in this encounter.  No orders of the defined types were placed in this encounter.   Follow-Up Instructions: Return in about 5 months (around 04/03/2023) for Spondyloarthropathy, Iritis.   Gearldine Bienenstock, PA-C  Note - This record has been created using Dragon software.  Chart creation errors have been sought, but may not always  have been located. Such creation errors do not reflect on  the standard of medical care.

## 2022-10-21 ENCOUNTER — Ambulatory Visit: Payer: Medicaid Other | Admitting: Adult Health

## 2022-10-21 ENCOUNTER — Ambulatory Visit: Payer: MEDICAID | Admitting: Podiatry

## 2022-10-21 ENCOUNTER — Encounter: Payer: Self-pay | Admitting: Podiatry

## 2022-10-21 DIAGNOSIS — L600 Ingrowing nail: Secondary | ICD-10-CM | POA: Diagnosis not present

## 2022-10-21 HISTORY — PX: OTHER SURGICAL HISTORY: SHX169

## 2022-10-21 NOTE — Progress Notes (Signed)
  Subjective:  Patient ID: Leon Mason, male    DOB: [birth year],   MRN: [id number]  Chief Complaint  Patient presents with   Ingrown Toenail    bilateral hallux ingrown. The right one is worse pt said.    32 y.o. male presents for concern of bilateral ingrown toenails that have been present for several weeks. He has had ingrowns on his toes before and had procedure on his left great toe. Relates he is hoping to have procedure done today to get rid of the ingrown . Denies any other pedal complaints. Denies n/v/f/c.   Past Medical History:  Diagnosis Date   Anxiety    Asthma    Bipolar 1 disorder (HCC)    Hallucinations    Schizoaffective disorder (HCC)    Schizophrenia (HCC)     Objective:  Physical Exam: Vascular: DP/PT pulses 2/4 bilateral. CFT <3 seconds. Normal hair growth on digits. No edema.  Skin. No lacerations or abrasions bilateral feet. Incurvation of medial border and lateral border on the right. Mild maceration noted to medial border and tenderness to palpation. No erythema edema or purulence noted.  Musculoskeletal: MMT 5/5 bilateral lower extremities in DF, PF, Inversion and Eversion. Deceased ROM in DF of ankle joint.  Neurological: Sensation intact to light touch.   Assessment:   1. Ingrown right greater toenail      Plan:  Patient was evaluated and treated and all questions answered. Discussed ingrown toenails etiology and treatment options including procedure for removal vs conservative care.  Patient requesting removal of ingrown nail today. Procedure below.  Discussed procedure and post procedure care and patient expressed understanding.  Will follow-up in 2 weeks for nail check or sooner if any problems arise.    Procedure:  Procedure: partial Nail Avulsion of right hallux bilateral nail border.  Surgeon: Louann Sjogren, DPM  Pre-op Dx: Ingrown toenail without infection Post-op: Same  Place of Surgery: Office exam room.  Indications for surgery:  Painful and ingrown toenail.    The patient is requesting removal of nail with chemical matrixectomy. Risks and complications were discussed with the patient for which they understand and written consent was obtained. Under sterile conditions a total of 3 mL of  1% lidocaine plain was infiltrated in a hallux block fashion. Once anesthetized, the skin was prepped in sterile fashion. A tourniquet was then applied. Next the bilateral aspect of hallux nail border was then sharply excised making sure to remove the entire offending nail border.  Next phenol was then applied under standard conditions and copiously irrigated. Silvadene was applied. A dry sterile dressing was applied. After application of the dressing the tourniquet was removed and there is found to be an immediate capillary refill time to the digit. The patient tolerated the procedure well without any complications. Post procedure instructions were discussed the patient for which he verbally understood. Follow-up in two weeks for nail check or sooner if any problems are to arise. Discussed signs/symptoms of infection and directed to call the office immediately should any occur or go directly to the emergency room. In the meantime, encouraged to call the office with any questions, concerns, changes symptoms.   Louann Sjogren, DPM

## 2022-10-21 NOTE — Patient Instructions (Signed)

## 2022-11-01 ENCOUNTER — Ambulatory Visit: Payer: MEDICAID | Attending: Physician Assistant | Admitting: Physician Assistant

## 2022-11-01 ENCOUNTER — Encounter: Payer: Self-pay | Admitting: Physician Assistant

## 2022-11-01 ENCOUNTER — Other Ambulatory Visit: Payer: Self-pay | Admitting: *Deleted

## 2022-11-01 ENCOUNTER — Encounter: Payer: Self-pay | Admitting: Adult Health

## 2022-11-01 ENCOUNTER — Ambulatory Visit (INDEPENDENT_AMBULATORY_CARE_PROVIDER_SITE_OTHER): Payer: MEDICAID | Admitting: Adult Health

## 2022-11-01 VITALS — BP 139/82 | HR 83 | Resp 18 | Ht 75.0 in | Wt 317.2 lb

## 2022-11-01 DIAGNOSIS — M546 Pain in thoracic spine: Secondary | ICD-10-CM

## 2022-11-01 DIAGNOSIS — R6510 Systemic inflammatory response syndrome (SIRS) of non-infectious origin without acute organ dysfunction: Secondary | ICD-10-CM

## 2022-11-01 DIAGNOSIS — G4700 Insomnia, unspecified: Secondary | ICD-10-CM | POA: Diagnosis not present

## 2022-11-01 DIAGNOSIS — Z79899 Other long term (current) drug therapy: Secondary | ICD-10-CM | POA: Diagnosis not present

## 2022-11-01 DIAGNOSIS — R9431 Abnormal electrocardiogram [ECG] [EKG]: Secondary | ICD-10-CM

## 2022-11-01 DIAGNOSIS — F1020 Alcohol dependence, uncomplicated: Secondary | ICD-10-CM

## 2022-11-01 DIAGNOSIS — Z1589 Genetic susceptibility to other disease: Secondary | ICD-10-CM

## 2022-11-01 DIAGNOSIS — Z1283 Encounter for screening for malignant neoplasm of skin: Secondary | ICD-10-CM

## 2022-11-01 DIAGNOSIS — R5383 Other fatigue: Secondary | ICD-10-CM

## 2022-11-01 DIAGNOSIS — H209 Unspecified iridocyclitis: Secondary | ICD-10-CM

## 2022-11-01 DIAGNOSIS — M25562 Pain in left knee: Secondary | ICD-10-CM

## 2022-11-01 DIAGNOSIS — F250 Schizoaffective disorder, bipolar type: Secondary | ICD-10-CM

## 2022-11-01 DIAGNOSIS — F411 Generalized anxiety disorder: Secondary | ICD-10-CM | POA: Diagnosis not present

## 2022-11-01 DIAGNOSIS — L500 Allergic urticaria: Secondary | ICD-10-CM

## 2022-11-01 DIAGNOSIS — M5489 Other dorsalgia: Secondary | ICD-10-CM

## 2022-11-01 DIAGNOSIS — R002 Palpitations: Secondary | ICD-10-CM

## 2022-11-01 DIAGNOSIS — M47819 Spondylosis without myelopathy or radiculopathy, site unspecified: Secondary | ICD-10-CM

## 2022-11-01 DIAGNOSIS — F17200 Nicotine dependence, unspecified, uncomplicated: Secondary | ICD-10-CM

## 2022-11-01 DIAGNOSIS — J4530 Mild persistent asthma, uncomplicated: Secondary | ICD-10-CM

## 2022-11-01 DIAGNOSIS — G8929 Other chronic pain: Secondary | ICD-10-CM

## 2022-11-01 DIAGNOSIS — M533 Sacrococcygeal disorders, not elsewhere classified: Secondary | ICD-10-CM

## 2022-11-01 DIAGNOSIS — F331 Major depressive disorder, recurrent, moderate: Secondary | ICD-10-CM

## 2022-11-01 DIAGNOSIS — G5622 Lesion of ulnar nerve, left upper limb: Secondary | ICD-10-CM

## 2022-11-01 DIAGNOSIS — M5030 Other cervical disc degeneration, unspecified cervical region: Secondary | ICD-10-CM

## 2022-11-01 MED ORDER — HUMIRA (2 PEN) 40 MG/0.4ML ~~LOC~~ AJKT: AUTO-INJECTOR | SUBCUTANEOUS | 0 refills | Status: DC

## 2022-11-01 MED ORDER — DICLOFENAC SODIUM 1 % EX GEL: 2.0000 g | Freq: Four times a day (QID) | CUTANEOUS | 1 refills | Status: DC

## 2022-11-01 NOTE — Patient Instructions (Addendum)
Arthritis compression gloves      Standing Labs We placed an order today for your standing lab work.   Please have your standing labs drawn in October and every 3 months   Please have your labs drawn 2 weeks prior to your appointment so that the provider can discuss your lab results at your appointment, if possible.  Please note that you may see your imaging and lab results in MyChart before we have reviewed them. We will contact you once all results are reviewed. Please allow our office up to 72 hours to thoroughly review all of the results before contacting the office for clarification of your results.  WALK-IN LAB HOURS  Monday through Thursday from 8:00 am -12:30 pm and 1:00 pm-5:00 pm and Friday from 8:00 am-12:00 pm.  Patients with office visits requiring labs will be seen before walk-in labs.  You may encounter longer than normal wait times. Please allow additional time. Wait times may be shorter on  Monday and Thursday afternoons.  We do not book appointments for walk-in labs. We appreciate your patience and understanding with our staff.   Labs are drawn by [facility]. Please bring your co-pay at the time of your lab draw.  You may receive a bill from [facility] for your lab work.  Please note if you are on Hydroxychloroquine and and an order has been placed for a Hydroxychloroquine level,  you will need to have it drawn 4 hours or more after your last dose.  If you wish to have your labs drawn at another location, please call the office 24 hours in advance so we can fax the orders.  The office is located at [address], [address], [place], [place] [address]   If you have any questions regarding directions or hours of operation,  please call [phone].   As a reminder, please drink plenty of water prior to coming for your lab work. Thanks!

## 2022-11-01 NOTE — Progress Notes (Signed)
Leon Mason [id number] Aug 13, 1990 32 y.o.  Subjective:   Patient ID:  Leon Mason is a 32 y.o. (DOB [birth year]) male.  Chief Complaint: No chief complaint on file.   HPI Yun Conn Vancleve presents to the office today for follow-up of MDD, GAD, insomnia, Schizophrenia.  Describes mood today as "ok". Pleasant. Mood symptoms - reports some depression, anxiety, and irritability - situational stressors. Denies panic attacks. Reports rumination - thinking about things longer than he soul. Denies worry and over thinking. Concerned about the state of the country. Mood is consistent. Stating "I'm struggling with frustration". Lives at home with mother and brother. Decreased interest and motivation. Taking medications as prescribed. Energy levels stable. Active, does not have a regular exercise routine. Walking 10,000 steps a day. Enjoys some usual interests and activities. Single. Lives with mother (disabled) - brother 3 cats and a dog. Spending time with family - Aunt and uncle local.  Appetite adequate. Weight gain 315 from 285 pounds - using food to cope with stress. Sleeps well most nights. Averages 8 hours. Focus and concentration stable. Completing tasks. Managing aspects of household. 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 Denies SI or HI.  Denies AH or VH. Reports paranoid thoughts. Denies self harm. Denies substance use.    Seeing [name] for therapy.  Previous medications: Perphenazine, Lithium, Sertraline, Haldol, Zyprexa, Risperdal, stopped due to various side effects, ie biting tongue a lot, restless legs, weight gain    GAD-7    Flowsheet Row Office Visit from 09/22/2022 in [facility] Office Visit from 06/15/2021 in [facility] Office Visit from 05/29/2021 in [facility] Office Visit from 02/16/2021 in [facility] Office Visit from  10/21/2020 in [facility]  Total GAD-7 Score 13 8 8 16 13       PHQ2-9    Flowsheet Row Office Visit from 09/22/2022 in [facility] Office Visit from 06/15/2021 in [facility] Office Visit from 05/29/2021 in [facility] Office Visit from 02/16/2021 in [facility] Office Visit from 10/21/2020 in [facility]  PHQ-2 Total Score 3 3 2 3 6   PHQ-9 Total Score 9 6 7 7 17       Flowsheet Row ED from 08/25/2022 in [facility] Emergency Department at [facility] ED from 08/25/2020 in [facility] Ur[facility] Care at [facility] ED from 08/02/2020 in [facility] at [facility] RISK CATEGORY No Risk No Risk No Risk        Review of Systems:  Review of Systems  Musculoskeletal:  Negative for gait problem.  Neurological:  Negative for tremors.  Psychiatric/Behavioral:         Please refer to HPI    Medications: I have reviewed the patient's current medications.  Current Outpatient Medications  Medication Sig Dispense Refill   acetaminophen (TYLENOL) 500 MG tablet Take 1 tablet (500 mg total) by mouth every 6 (six) hours as needed. 30 tablet 0   Adalimumab (HUMIRA, 2 PEN,) 40 MG/0.4ML PNKT INJECT 1 PEN SUBCUTANEOUSLY ONCE EVERY 14 DAYS 6 each 0   ADVAIR DISKUS 250-50 MCG/ACT AEPB Inhale 1 puff into the lungs in the morning and at bedtime. 180 each 3   cyclobenzaprine (FLEXERIL) 10 MG tablet Take 1 tablet (10 mg total) by mouth 2 (two)  times daily as needed for muscle spasms. 20 tablet 0   diclofenac Sodium (VOLTAREN) 1 % GEL Apply 2 g topically 4 (four) times daily. 400 g 1   folic acid (FOLVITE) 1 MG tablet Take 1 tablet (1 mg total) by mouth daily. 90 tablet 1   IBUPROFEN PO Take by mouth as needed.     levocetirizine (XYZAL) 5 MG tablet Take 1 tablet (5 mg total) by mouth  every evening. For allergies. Use instead of Claritin 90 tablet 3   ondansetron (ZOFRAN) 4 MG tablet Take 1 tablet (4 mg total) by mouth every 6 (six) hours. 12 tablet 0   sertraline (ZOLOFT) 50 MG tablet Take 1 tablet (50 mg total) by mouth daily. 90 tablet 3   thiamine (VITAMIN B1) 100 MG tablet Take 1 tablet (100 mg total) by mouth daily. 90 tablet 1   triamcinolone cream (KENALOG) 0.1 % Apply 1 Application topically 2 (two) times daily as needed (poison oak/ivy rash (max 10 days per use)). 80 g 0   VENTOLIN HFA 108 (90 Base) MCG/ACT inhaler Inhale 1 puff into the lungs daily.     No current facility-administered medications for this visit.    Medication Side Effects: None  Allergies:  Allergies  Allergen Reactions   Other Hives and Shortness Of Breath    Dogs and Cats.   Dust Mite Extract    Pollen Extract     Past Medical History:  Diagnosis Date   Anxiety    Asthma    Bipolar 1 disorder (HCC)    Hallucinations    Schizoaffective disorder (HCC)    Schizophrenia (HCC)     Past Medical History, Surgical history, Social history, and Family history were reviewed and updated as appropriate.   Please see review of systems for further details on the patient's review from today.   Objective:   Physical Exam:  There were no vitals taken for this visit.  Physical Exam Constitutional:      General: He is not in acute distress. Musculoskeletal:        General: No deformity.  Neurological:     Mental Status: He is alert and oriented to person, place, and time.     Coordination: Coordination normal.  Psychiatric:        Attention and Perception: Attention and perception normal. He does not perceive auditory or visual hallucinations.        Mood and Affect: Mood normal. Mood is not anxious or depressed. Affect is not labile, blunt, angry or inappropriate.        Speech: Speech normal.        Behavior: Behavior normal.        Thought Content: Thought content normal. Thought  content is not paranoid or delusional. Thought content does not include homicidal or suicidal ideation. Thought content does not include homicidal or suicidal plan.        Cognition and Memory: Cognition and memory normal.        Judgment: Judgment normal.     Comments: Insight intact     Lab Review:     Component Value Date/Time   NA 138 09/22/2022 1520   K 4.3 09/22/2022 1520   CL 99 09/22/2022 1520   CO2 23 09/22/2022 1520   GLUCOSE 86 09/22/2022 1520   GLUCOSE 99 08/25/2022 1243   BUN 15 09/22/2022 1520   CREATININE 0.96 09/22/2022 1520   CREATININE 0.83 08/23/2022 1457   CALCIUM 9.7 09/22/2022 1520   PROT 7.6  09/22/2022 1520   ALBUMIN 4.6 09/22/2022 1520   AST 41 (H) 09/22/2022 1520   ALT 54 (H) 09/22/2022 1520   ALKPHOS 69 09/22/2022 1520   BILITOT 0.4 09/22/2022 1520   GFRNONAA >60 08/25/2022 1243   GFRAA >60 02/19/2019 0602       Component Value Date/Time   WBC 5.9 09/22/2022 1520   WBC 12.3 (H) 08/25/2022 1243   RBC 4.65 09/22/2022 1520   RBC 4.69 08/25/2022 1243   HGB 14.8 09/22/2022 1520   HCT 43.5 09/22/2022 1520   PLT 181 09/22/2022 1520   MCV 94 09/22/2022 1520   MCH 31.8 09/22/2022 1520   MCH 30.9 08/25/2022 1243   MCHC 34.0 09/22/2022 1520   MCHC 34.9 08/25/2022 1243   RDW 14.3 09/22/2022 1520   LYMPHSABS 1.2 08/25/2022 1243   MONOABS 0.7 08/25/2022 1243   EOSABS 0.3 08/25/2022 1243   BASOSABS 0.1 08/25/2022 1243    Lithium Lvl  Date Value Ref Range Status  03/30/2018 <0.06 (L) 0.60 - 1.20 mmol/L Final    Comment:    REPEATED TO VERIFY Performed at [facility], [address], [place], [place] [address]      No results found for: "PHENYTOIN", "PHENOBARB", "VALPROATE", "CBMZ"   .res Assessment: Plan:    Plan:  PDMP reviewed  Zoloft 50mg   Perphenazine - 4mg  - 2 at hs.  Restart therapy - Andy Mitchum.  EKG - prolonged QT interval - last EKG 05/2021 - planning  RTC 3 months  Patient advised to contact office  with any questions, adverse effects, or acute worsening in signs and symptoms.  Discussed potential metabolic side effects associated with atypical antipsychotics, as well as potential risk for movement side effects. Advised pt to contact office if movement side effects occur.    There are no diagnoses linked to this encounter.   Please see After Visit Summary for patient specific instructions.  Future Appointments  Date Time Provider Department Center  11/01/2022 10:40 AM Rosezetta Balderston, Thereasa Solo, NP [facility] None  11/01/2022  1:10 PM Gearldine Bienenstock, PA-C [facility] None  11/11/2022  3:45 PM Louann Sjogren, DPM [facility] None  11/19/2022  3:00 PM Robley Fries, PhD [facility] None  12/27/2022  3:30 PM Raliegh Ip, DO [facility] None    No orders of the defined types were placed in this encounter.   -------------------------------

## 2022-11-01 NOTE — Telephone Encounter (Signed)
Last Fill: 08/23/2022  Labs: 09/22/2022 AST 41, ALT 54  TB Gold: 08/23/2022  TB gold negative   Next Visit: 03/25/2023  Last Visit: 11/01/2022  [id number]   Current Dose per office note 11/01/2022: Humira 40 mg sq injections every 14 days, refill of Voltaren gel   Okay to refill Humira and voltaren?

## 2022-11-02 ENCOUNTER — Ambulatory Visit (HOSPITAL_COMMUNITY): Payer: Self-pay | Admitting: Psychiatry

## 2022-11-03 ENCOUNTER — Telehealth: Payer: Self-pay | Admitting: Family Medicine

## 2022-11-11 ENCOUNTER — Ambulatory Visit: Payer: MEDICAID | Admitting: Podiatry

## 2022-11-11 ENCOUNTER — Encounter: Payer: Self-pay | Admitting: Podiatry

## 2022-11-11 DIAGNOSIS — L600 Ingrowing nail: Secondary | ICD-10-CM

## 2022-11-11 NOTE — Progress Notes (Signed)
  Subjective:  Patient ID: Leon Mason, male    DOB: February 06, 1991,   MRN: [id number]  Chief Complaint  Patient presents with   Nail Problem    Pt presents for a  Follow for nail check     32 y.o. male presents for follow-up of right ingrown toenail procedure. Relates doing well and has finished soaks.  . Denies any other pedal complaints. Denies n/v/f/c.   Past Medical History:  Diagnosis Date   Anxiety    Asthma    Bipolar 1 disorder (HCC)    Hallucinations    Schizoaffective disorder (HCC)    Schizophrenia (HCC)     Objective:  Physical Exam: Vascular: DP/PT pulses 2/4 bilateral. CFT <3 seconds. Normal hair growth on digits. No edema.  Skin. No lacerations or abrasions bilateral feet. Right hallux nail healing well.  Musculoskeletal: MMT 5/5 bilateral lower extremities in DF, PF, Inversion and Eversion. Deceased ROM in DF of ankle joint.  Neurological: Sensation intact to light touch.   Assessment:   1. Ingrown right greater toenail      Plan:  Patient was evaluated and treated and all questions answered. Toe was evaluated and appears to be healing well.  May discontinue soaks and neosporin.  Patient to follow-up as needed.    Louann Sjogren, DPM

## 2022-11-16 ENCOUNTER — Telehealth: Payer: Self-pay | Admitting: Rheumatology

## 2022-11-16 NOTE — Telephone Encounter (Signed)
Pt called stating he missed the week of his humira shot 2 weeks ago. Pt states he took the shot on Thursday, and now has a bunch a fluid in the left knee. Pt says it feels like its going to dislocate. Pt would like a call back to know what to do and asked to leave a detailed message on his VM if no answer.

## 2022-11-16 NOTE — Telephone Encounter (Signed)
Attempted to contact the patient and left message to advise patient to contact the office to schedule an appointment for evaluation.

## 2022-11-17 NOTE — Telephone Encounter (Signed)
LMOM for patient to call to schedule appt

## 2022-11-17 NOTE — Telephone Encounter (Signed)
Patient offered an appointment for 11/23/2022. Patient declined appointment and staets he will go to urgent care.

## 2022-11-19 ENCOUNTER — Ambulatory Visit: Payer: MEDICAID | Admitting: Psychiatry

## 2022-11-19 DIAGNOSIS — Z636 Dependent relative needing care at home: Secondary | ICD-10-CM

## 2022-11-19 DIAGNOSIS — F411 Generalized anxiety disorder: Secondary | ICD-10-CM

## 2022-11-19 DIAGNOSIS — F1020 Alcohol dependence, uncomplicated: Secondary | ICD-10-CM

## 2022-11-19 DIAGNOSIS — F250 Schizoaffective disorder, bipolar type: Secondary | ICD-10-CM

## 2022-11-19 DIAGNOSIS — Z638 Other specified problems related to primary support group: Secondary | ICD-10-CM

## 2022-11-19 DIAGNOSIS — F1291 Cannabis use, unspecified, in remission: Secondary | ICD-10-CM
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
## 2022-11-23 ENCOUNTER — Ambulatory Visit: Payer: Medicaid Other | Admitting: Physician Assistant

## 2022-11-23 ENCOUNTER — Ambulatory Visit: Payer: MEDICAID | Admitting: Nurse Practitioner

## 2022-11-23 ENCOUNTER — Telehealth: Payer: Self-pay | Admitting: Adult Health

## 2022-11-23 NOTE — Telephone Encounter (Signed)
Leon Mason called at 3:50 to report that he is getting an EKG today.  They couldn't find your order, but they said the results should come into epic.  I told him to sign a release to have them send [place] results just in case we can't see them in epic.  He is going to an [facility] urgent care.

## 2022-11-24 ENCOUNTER — Encounter: Payer: Self-pay | Admitting: Family Medicine

## 2022-11-24 ENCOUNTER — Telehealth: Payer: Self-pay | Admitting: Family Medicine

## 2022-11-24 NOTE — Telephone Encounter (Signed)
Can wait

## 2022-11-24 NOTE — Telephone Encounter (Signed)
I have checked for results and I do not see anything in Epic or Care Everywhere.

## 2022-11-25 NOTE — Telephone Encounter (Signed)
Patient had EKG, but the results are not currently available.

## 2022-11-28 NOTE — Telephone Encounter (Signed)
Still not able to view EKG results.  Will call patient and ask provider to send [place] results.

## 2022-11-30 NOTE — Telephone Encounter (Signed)
Called patient and he said he had a physical copy and he will fax it to [place] after he gets off work today, verified fax # with him.

## 2022-11-30 NOTE — Telephone Encounter (Signed)
Looks normal [employer]

## 2022-12-01 ENCOUNTER — Ambulatory Visit: Payer: MEDICAID | Admitting: Family Medicine

## 2022-12-01 ENCOUNTER — Encounter: Payer: Self-pay | Admitting: Family Medicine

## 2022-12-01 DIAGNOSIS — J398 Other specified diseases of upper respiratory tract: Secondary | ICD-10-CM

## 2022-12-01 DIAGNOSIS — J029 Acute pharyngitis, unspecified: Secondary | ICD-10-CM

## 2022-12-01 LAB — CULTURE, GROUP A STREP

## 2022-12-01 LAB — RAPID STREP SCREEN (MED CTR MEBANE ONLY): Strep Gp A Ag, IA W/Reflex: NEGATIVE

## 2022-12-01 NOTE — Progress Notes (Signed)
Subjective:  Patient ID: Leon Mason, male    DOB: 03-18-1990, 32 y.o.   MRN: [id number]  Patient Care Team: Raliegh Ip, DO as PCP - General (Family Medicine) Maisie Fus, MD as PCP - Cardiology (Cardiology)   Chief Complaint:  cold/flu like symptoms (Nasal congestion/Chest congestion/SOB/Achy/Swollen tonsils/X 1 day/)   HPI: Leon Mason is a 32 y.o. male presenting on 12/01/2022 for cold/flu like symptoms (Nasal congestion/Chest congestion/SOB/Achy/Swollen tonsils/X 1 day/)  HPI 1. Congestion of upper respiratory tract States that symptoms started yesterday. Nasal congestion, productive cough clear.  Aches and pain. Swollen tonsils. Runny nose, sneezing, cough.  Has not completed a home covid test.  Has not tried any OTC medications.  [facility] this [facility] due to psych request and believes he got sick from there.  Has history of asthma, is using inhaler once weekly.  Endorses shortness of breath, but has not increased frequency of inhaler. Took it last night.  Does not know of any fever.   Relevant past medical, surgical, family, and social history reviewed and updated as indicated.  Allergies and medications reviewed and updated. Data reviewed: Chart in Epic.   Past Medical History:  Diagnosis Date   Anxiety    Asthma    Bipolar 1 disorder (HCC)    Hallucinations    Schizoaffective disorder (HCC)    Schizophrenia (HCC)     Past Surgical History:  Procedure Laterality Date   ingrown toenail Right 10/21/2022   great toe   WISDOM TOOTH EXTRACTION  2009   all four    Social History   Socioeconomic History   Marital status: Single    Spouse name: Not on file   Number of children: Not on file   Years of education: Not on file   Highest education level: 12th grade  Occupational History   Not on file  Tobacco Use   Smoking status: Former    Current packs/day: 0.00    Types: Cigarettes    Start date: 11/27/2017    Quit date:  05/28/2018    Years since quitting: 4.5    Passive exposure: Current   Smokeless tobacco: Never  Vaping Use   Vaping status: Former  Substance and Sexual Activity   Alcohol use: Yes    Comment: 1 beer weekly   Drug use: Not Currently    Types: Marijuana   Sexual activity: Not Currently  Other Topics Concern   Not on file  Social History Narrative   Not on file   Social Determinants of Health   Financial Resource Strain: High Risk (11/19/2022)   Overall Financial Resource Strain (CARDIA)    Difficulty of Paying Living Expenses: Hard  Food Insecurity: Food Insecurity Present (11/19/2022)   Hunger Vital Sign    Worried About Running Out of Food in the Last Year: Sometimes true    Ran Out of Food in the Last Year: Sometimes true  Transportation Needs: Unmet Transportation Needs (11/19/2022)   PRAPARE - Transportation    Lack of Transportation (Medical): Yes    Lack of Transportation (Non-Medical): Yes  Physical Activity: Sufficiently Active (11/19/2022)   Exercise Vital Sign    Days of Exercise per Week: 5 days    Minutes of Exercise per Session: 120 min  Stress: Stress Concern Present (11/19/2022)   [employer] of Occupational Health - Occupational Stress Questionnaire    Feeling of Stress : Very much  Social Connections: Socially Isolated (11/19/2022)   Social  Connection and Isolation Panel [NHANES]    Frequency of Communication with Friends and Family: More than three times a week    Frequency of Social Gatherings with Friends and Family: Never    Attends Religious Services: Never    Database administrator or Organizations: No    Attends Engineer, structural: Not on file    Marital Status: Never married  Intimate Partner Violence: Unknown (06/12/2021)   Received from [employer], [facility]   HITS    Physically Hurt: Not on file    Insult or Talk Down To: Not on file    Threaten Physical Harm: Not on file    Scream or Curse: Not on file    Outpatient  Encounter Medications as of 12/01/2022  Medication Sig   adalimumab (HUMIRA, 2 PEN,) 40 MG/0.4ML pen INJECT 1 PEN SUBCUTANEOUSLY ONCE EVERY 14 DAYS   Albuterol Sulfate (PROAIR HFA IN) Inhale into the lungs as needed.   diclofenac Sodium (VOLTAREN) 1 % GEL Apply 2 g topically 4 (four) times daily.   diphenhydrAMINE-Zinc Acetate (BENADRYL EX) Apply topically as needed.   fluticasone-salmeterol (ADVAIR) 250-50 MCG/ACT AEPB Inhale 1 puff into the lungs daily.   folic acid (FOLVITE) 1 MG tablet Take 1 tablet (1 mg total) by mouth daily.   IBUPROFEN PO Take by mouth as needed.   Multiple Vitamin (MULTIVITAMIN PO) Take by mouth daily.   perphenazine (TRILAFON) 4 MG tablet Take 4 mg by mouth 2 (two) times daily.   sertraline (ZOLOFT) 50 MG tablet Take 1 tablet (50 mg total) by mouth daily.   thiamine (VITAMIN B1) 100 MG tablet Take 1 tablet (100 mg total) by mouth daily.   triamcinolone cream (KENALOG) 0.1 % Apply 1 Application topically 2 (two) times daily as needed (poison oak/ivy rash (max 10 days per use)). (Patient not taking: Reported on 11/01/2022)   UNABLE TO FIND Med Name: Bug spray topically, daily use.   [DISCONTINUED] acetaminophen (TYLENOL) 500 MG tablet Take 1 tablet (500 mg total) by mouth every 6 (six) hours as needed.   [DISCONTINUED] cyclobenzaprine (FLEXERIL) 10 MG tablet Take 1 tablet (10 mg total) by mouth 2 (two) times daily as needed for muscle spasms.   [DISCONTINUED] levocetirizine (XYZAL) 5 MG tablet Take 1 tablet (5 mg total) by mouth every evening. For allergies. Use instead of Claritin   [DISCONTINUED] mometasone-formoterol (DULERA) 100-5 MCG/ACT AERO Inhale 2 puffs into the lungs 2 (two) times daily. For shortness of breath (Patient not taking: Reported on 02/17/2019)   No facility-administered encounter medications on file as of 12/01/2022.    Allergies  Allergen Reactions   Other Hives and Shortness Of Breath    Dogs and Cats.   Dust Mite Extract    Pollen Extract      Review of Systems As per HPI  Objective:  BP 124/76   Pulse 71   Temp 98.7 F (37.1 C)   Ht 6\' 3"  (1.905 m)   Wt (!) 310 lb (140.6 kg)   SpO2 96%   BMI 38.75 kg/m    Wt Readings from Last 3 Encounters:  12/01/22 (!) 310 lb (140.6 kg)  11/01/22 (!) 317 lb 3.2 oz (143.9 kg)  09/22/22 (!) 322 lb (146.1 kg)   Physical Exam Constitutional:      General: He is awake. He is not in acute distress.    Appearance: Normal appearance. He is well-developed and well-groomed. He is obese. He is not ill-appearing, toxic-appearing or diaphoretic.  HENT:  Right Ear: No decreased hearing noted. No laceration, drainage, swelling or tenderness. A middle ear effusion is present. There is no impacted cerumen. No foreign body. No mastoid tenderness. No PE tube. No hemotympanum. Tympanic membrane is not injected, scarred, perforated, erythematous, retracted or bulging.     Left Ear: No decreased hearing noted. No laceration, drainage, swelling or tenderness.  No middle ear effusion. There is no impacted cerumen. No foreign body. No mastoid tenderness. No PE tube. No hemotympanum. Tympanic membrane is injected and erythematous. Tympanic membrane is not scarred, perforated, retracted or bulging.     Nose: Congestion and rhinorrhea present. Rhinorrhea is clear.     Right Sinus: No maxillary sinus tenderness or frontal sinus tenderness.     Left Sinus: No maxillary sinus tenderness or frontal sinus tenderness.     Mouth/Throat:     Lips: Pink. No lesions.     Mouth: Mucous membranes are moist.     Tongue: No lesions.     Palate: No mass.     Pharynx: Posterior oropharyngeal erythema present. No pharyngeal swelling, oropharyngeal exudate or postnasal drip.     Tonsils: No tonsillar exudate or tonsillar abscesses. 2+ on the right. 2+ on the left.  Cardiovascular:     Rate and Rhythm: Normal rate and regular rhythm.     Pulses: Normal pulses.          Radial pulses are 2+ on the right side and 2+ on  the left side.       Posterior tibial pulses are 2+ on the right side and 2+ on the left side.     Heart sounds: Normal heart sounds. No murmur heard.    No gallop.  Pulmonary:     Effort: Pulmonary effort is normal. No respiratory distress.     Breath sounds: Normal breath sounds. No stridor. No wheezing, rhonchi or rales.  Musculoskeletal:     Cervical back: Full passive range of motion without pain and neck supple.     Right lower leg: No edema.     Left lower leg: No edema.  Lymphadenopathy:     Head:     Right side of head: Tonsillar adenopathy present. No submental, submandibular, preauricular or posterior auricular adenopathy.     Left side of head: No submental, submandibular, tonsillar, preauricular or posterior auricular adenopathy.  Skin:    General: Skin is warm.     Capillary Refill: Capillary refill takes less than 2 seconds.  Neurological:     General: No focal deficit present.     Mental Status: He is alert, oriented to person, place, and time and easily aroused. Mental status is at baseline.     GCS: GCS eye subscore is 4. GCS verbal subscore is 5. GCS motor subscore is 6.     Motor: No weakness.  Psychiatric:        Attention and Perception: Attention and perception normal.        Mood and Affect: Mood and affect normal.        Speech: Speech normal.        Behavior: Behavior normal. Behavior is cooperative.        Thought Content: Thought content normal. Thought content does not include homicidal or suicidal ideation. Thought content does not include homicidal or suicidal plan.        Cognition and Memory: Cognition and memory normal.        Judgment: Judgment normal.     Results for orders placed or  performed in visit on 09/22/22  Bayer DCA Hb A1c Waived  Result Value Ref Range   HB A1C (BAYER DCA - WAIVED) 5.0 4.8 - 5.6 %  TSH  Result Value Ref Range   TSH 0.970 0.450 - 4.500 uIU/mL  T4, Free  Result Value Ref Range   Free T4 1.11 0.82 - 1.77 ng/dL   [id number]  Result Value Ref Range   Glucose 86 70 - 99 mg/dL   BUN 15 6 - 20 mg/dL   Creatinine, Ser 9.56 0.76 - 1.27 mg/dL   eGFR 213 >08 MV/HQI/6.96   BUN/Creatinine Ratio 16 9 - 20   Sodium 138 134 - 144 mmol/L   Potassium 4.3 3.5 - 5.2 mmol/L   Chloride 99 96 - 106 mmol/L   CO2 23 20 - 29 mmol/L   Calcium 9.7 8.7 - 10.2 mg/dL   Total Protein 7.6 6.0 - 8.5 g/dL   Albumin 4.6 4.1 - 5.1 g/dL   Globulin, Total 3.0 1.5 - 4.5 g/dL   Bilirubin Total 0.4 0.0 - 1.2 mg/dL   Alkaline Phosphatase 69 44 - 121 IU/L   AST 41 (H) 0 - 40 IU/L   ALT 54 (H) 0 - 44 IU/L  CBC  Result Value Ref Range   WBC 5.9 3.4 - 10.8 x10E3/uL   RBC 4.65 4.14 - 5.80 x10E6/uL   Hemoglobin 14.8 13.0 - 17.7 g/dL   Hematocrit 29.5 28.4 - 51.0 %   MCV 94 79 - 97 fL   MCH 31.8 26.6 - 33.0 pg   MCHC 34.0 31.5 - 35.7 g/dL   RDW 13.2 44.0 - 10.2 %   Platelets 181 150 - 450 x10E3/uL       09/22/2022    2:35 PM 06/15/2021    2:17 PM 05/29/2021   10:51 AM 02/16/2021   11:11 AM 10/21/2020    3:11 PM  Depression screen PHQ 2/9  Decreased Interest 2 2 1 1 3   Down, Depressed, Hopeless 1 1 1 2 3   PHQ - 2 Score 3 3 2 3 6   Altered sleeping 0 1 2 0 2  Tired, decreased energy 1 0 0 2 2  Change in appetite 2 2 1 1 2   Feeling bad or failure about yourself  2 0 2 0 3  Trouble concentrating 0 0 0 0 0  Moving slowly or fidgety/restless 0 0 0 0 0  Suicidal thoughts 1 0 0 1 2  PHQ-9 Score 9 6 7 7 17   Difficult doing work/chores Very difficult  Somewhat difficult Very difficult Very difficult       09/22/2022    2:35 PM 06/15/2021    2:17 PM 05/29/2021   10:51 AM 02/16/2021   11:13 AM  GAD 7 : Generalized Anxiety Score  Nervous, Anxious, on Edge 2 1 2 3   Control/stop worrying 2 1 1 2   Worry too much - different things 2 1 1 3   Trouble relaxing 2 1 1 3   Restless 1 0 0 0  Easily annoyed or irritable 2 2 2 2   Afraid - awful might happen 2 2 1 3   Total GAD 7 Score 13 8 8 16   Anxiety Difficulty Somewhat difficult   Somewhat difficult Very difficult   Pertinent labs & imaging results that were available during my care of the patient were reviewed by me and considered in my medical decision making.  Assessment & Plan:  Vladimir "Weston Brass" was seen today for cold/flu like symptoms.  Diagnoses and all  orders for this visit:  Congestion of upper respiratory tract Labs as below. Will communicate results to patient once available. Will await results to determine next steps.  Discussed supportive treatment at home.  -     COVID-19, Flu A+B and RSV  Sore throat Rapid strep negative. Will await results of culture to determine next steps.  Discussed supportive treatment at home.  -     Rapid Strep Screen ([facility] ONLY); Future -     Culture, Group A Strep; Future -     Culture, Group A Strep -     Rapid Strep Screen ([facility] ONLY)  Patient declined PHQ3/GAD7 screening.  Continue all other maintenance medications.  Follow up plan: Return if symptoms worsen or fail to improve.  Continue healthy lifestyle choices, including diet (rich in fruits, vegetables, and lean proteins, and low in salt and simple carbohydrates) and exercise (at least 30 minutes of moderate physical activity daily).  Written and verbal instructions provided   The above assessment and management plan was discussed with the patient. The patient verbalized understanding of and has agreed to the management plan. Patient is aware to call the clinic if they develop any new symptoms or if symptoms persist or worsen. Patient is aware when to return to the clinic for a follow-up visit. Patient educated on when it is appropriate to go to the emergency department.   Neale Burly, DNP-FNP [facility] [address] [place], [place] [address] [phone]

## 2022-12-02 LAB — CULTURE, GROUP A STREP: Strep A Culture: NEGATIVE

## 2022-12-03 ENCOUNTER — Telehealth: Payer: Self-pay | Admitting: Family Medicine

## 2022-12-03 LAB — COVID-19, FLU A+B AND RSV
Influenza A, NAA: NOT DETECTED
Influenza B, NAA: NOT DETECTED
RSV, NAA: NOT DETECTED
SARS-CoV-2, NAA: NOT DETECTED

## 2022-12-03 NOTE — Telephone Encounter (Signed)
Patient calling to check on results from 12/01/22 because he needs to let his work know something. Please review and call back.

## 2022-12-03 NOTE — Telephone Encounter (Signed)
Results are not back will call as soon as we have them

## 2022-12-04 ENCOUNTER — Encounter: Payer: Self-pay | Admitting: Family Medicine

## 2022-12-05 ENCOUNTER — Other Ambulatory Visit: Payer: Self-pay | Admitting: Family Medicine

## 2022-12-07 ENCOUNTER — Ambulatory Visit: Payer: MEDICAID | Admitting: Nurse Practitioner

## 2022-12-07 NOTE — Progress Notes (Signed)
Negative for all. Continue supportive therapy at home. Follow up if symptoms worsen or do not improve.

## 2022-12-13 ENCOUNTER — Ambulatory Visit: Payer: MEDICAID | Admitting: Psychiatry

## 2022-12-13 DIAGNOSIS — F250 Schizoaffective disorder, bipolar type: Secondary | ICD-10-CM | POA: Diagnosis not present

## 2022-12-13 DIAGNOSIS — Z638 Other specified problems related to primary support group: Secondary | ICD-10-CM

## 2022-12-13 DIAGNOSIS — F411 Generalized anxiety disorder: Secondary | ICD-10-CM | POA: Diagnosis not present

## 2022-12-13 DIAGNOSIS — Z636 Dependent relative needing care at home: Secondary | ICD-10-CM

## 2022-12-13 DIAGNOSIS — F1020 Alcohol dependence, uncomplicated: Secondary | ICD-10-CM

## 2022-12-13 NOTE — Progress Notes (Signed)
Psychotherapy Progress Note [facility] [name], PhD LP  Patient ID: Leon Mason [facility] "Leon Mason    MRN: [id number] Therapy format: Individual psychotherapy Date: 12/13/2022      Start: 1:11p     Stop: 1:57p     Time Spent: 46 min Location: In-person   Session narrative (presenting needs, interim history, self-report of stressors and symptoms, applications of prior therapy, status changes, and interventions made in session) Work steady, still early and on seasonal classification (11 months, figures he might be able to draw unemployment for the other month).  Has been struggling with alcohol, trying to limit, but went on a binge and lost his debit card.  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  Frustrating that she lied about it and made him take day off at $100 loss.  Support/empathy provided and encouraged again in redirecting evening impulses to go out and drink.    Did go on Meetup to see if there were attractive outlets, saw several possible interests.  Encouraged again in finding social options to his liking.  Ideas of taking up a hobby, like gardening, or fishing and cooking his own.  Says Leon Mason might be interested, actually.  Discussed interest in AA/Al-Anon, encouraged to check it out.  Meanwhile, has gotten some exercise at going out without drinking.  Encouraged further.  Therapeutic modalities: Cognitive Behavioral Therapy, Solution-Oriented/Positive Psychology, Environmental manager, and 12-Step  Mental Status/Observations:  Appearance:   Casual     Behavior:  Appropriate  Motor:  Normal  Speech/Language:   Clear and Coherent  Affect:  Appropriate  Mood:  depressed  Thought process:  normal  Thought content:    Rumination  Sensory/Perceptual disturbances:    WNL  Orientation:  Fully oriented  Attention:  Good    Concentration:  Fair   Memory:  WNL  Insight:    Variable  Judgment:   Variable  Impulse Control:  Variable   Risk Assessment: Danger to Self: No Self-injurious Behavior: No Danger to Others: No Physical Aggression / Violence: No Duty to Warn: No Access to Firearms a concern: No  Assessment of progress:  progressing  Diagnosis:   ICD-10-CM   1. Schizoaffective disorder, bipolar type (HCC)  F25.0     2. Generalized anxiety disorder  F41.1     3. Caregiver stress  Z63.6     4. Relationship problems with multiple family members  Z63.8     5. Alcohol use disorder, moderate, dependence (HCC)  F10.20      Plan:  Sobriety -- Seek abstinence for health and life.  Prioritize arranging escape time from the house to enjoy driving, games while parked, or meet ups with others and make sure to notice how it is enough relief without drinking.  Check [email or web address] for possible interests he might not think of. Work -- Programme researcher, broadcasting/film/video seasonal job as doing, pursue permanent status as health allows.  Previous assessment that he qualifies as disabled, but no opinion at present, and in favor of working if able. Health care -- By all means treat pre-cirrhosis appropriately Household stability and emergency welfare -- Likely benefit of ACTT team -- may ask DSS or the VA (in Leon Mason's name).  Contact [place] Adult Protective Services if anything rises to the level of neglect, abuse, or inability to manage health among the three in the household.  Mobile crisis service or law  enforcement as needed if domestic dispute intensifies.  Address coverable DME needs for [name] to her VA case worker, if identifiable.  If charitable sources for furniture, affordable cleaning, or other needs appropriate to elder, disabled, veteran are needed, check with DSS or VA as appropriate.  Continuing recommendations to household for mutual sobriety, engage 12-step support or affordable counseling.  In the long run, Pt best served by exiting the household and  establishing himself in his own space. Other recommendations/advice -- As may be noted above.  Continue to utilize previously learned skills ad lib. Medication compliance -- Maintain medication as prescribed and work faithfully with relevant prescriber(s) if any changes are desired or seem indicated. Crisis service -- Aware of call list and work-in appts.  Call the clinic on-call service, 988/hotline, 911, or present to [facility] or ER if any life-threatening psychiatric crisis. Followup -- Return for time as available.  Next scheduled visit with me 01/18/2023.  Next scheduled in this office 01/18/2023.  Robley Fries, PhD [name], PhD LP Clinical Psychologist, [facility] [address] [place], [place] [address] [phone]

## 2022-12-15 ENCOUNTER — Encounter: Payer: Self-pay | Admitting: Physician Assistant

## 2022-12-15 ENCOUNTER — Ambulatory Visit (INDEPENDENT_AMBULATORY_CARE_PROVIDER_SITE_OTHER): Payer: MEDICAID

## 2022-12-15 ENCOUNTER — Ambulatory Visit: Payer: MEDICAID | Attending: Physician Assistant | Admitting: Physician Assistant

## 2022-12-15 VITALS — BP 134/78 | HR 88 | Resp 15 | Ht 75.0 in | Wt 317.0 lb

## 2022-12-15 DIAGNOSIS — R9431 Abnormal electrocardiogram [ECG] [EKG]: Secondary | ICD-10-CM

## 2022-12-15 DIAGNOSIS — H209 Unspecified iridocyclitis: Secondary | ICD-10-CM

## 2022-12-15 DIAGNOSIS — M5030 Other cervical disc degeneration, unspecified cervical region: Secondary | ICD-10-CM

## 2022-12-15 DIAGNOSIS — M25562 Pain in left knee: Secondary | ICD-10-CM

## 2022-12-15 DIAGNOSIS — M533 Sacrococcygeal disorders, not elsewhere classified: Secondary | ICD-10-CM

## 2022-12-15 DIAGNOSIS — F250 Schizoaffective disorder, bipolar type: Secondary | ICD-10-CM

## 2022-12-15 DIAGNOSIS — Z79899 Other long term (current) drug therapy: Secondary | ICD-10-CM

## 2022-12-15 DIAGNOSIS — M25462 Effusion, left knee: Secondary | ICD-10-CM

## 2022-12-15 DIAGNOSIS — R6510 Systemic inflammatory response syndrome (SIRS) of non-infectious origin without acute organ dysfunction: Secondary | ICD-10-CM

## 2022-12-15 DIAGNOSIS — J4530 Mild persistent asthma, uncomplicated: Secondary | ICD-10-CM

## 2022-12-15 DIAGNOSIS — Z1589 Genetic susceptibility to other disease: Secondary | ICD-10-CM

## 2022-12-15 DIAGNOSIS — R002 Palpitations: Secondary | ICD-10-CM

## 2022-12-15 DIAGNOSIS — G5622 Lesion of ulnar nerve, left upper limb: Secondary | ICD-10-CM

## 2022-12-15 DIAGNOSIS — R5383 Other fatigue: Secondary | ICD-10-CM

## 2022-12-15 DIAGNOSIS — F1020 Alcohol dependence, uncomplicated: Secondary | ICD-10-CM

## 2022-12-15 DIAGNOSIS — M546 Pain in thoracic spine: Secondary | ICD-10-CM

## 2022-12-15 DIAGNOSIS — L500 Allergic urticaria: Secondary | ICD-10-CM

## 2022-12-15 DIAGNOSIS — M47819 Spondylosis without myelopathy or radiculopathy, site unspecified: Secondary | ICD-10-CM

## 2022-12-15 DIAGNOSIS — G8929 Other chronic pain: Secondary | ICD-10-CM

## 2022-12-15 DIAGNOSIS — F17200 Nicotine dependence, unspecified, uncomplicated: Secondary | ICD-10-CM

## 2022-12-15 LAB — SYNOVIAL FLUID ANALYSIS, COMPLETE
Basophils, %: 0 %
Eosinophils-Synovial: 0 % (ref 0–2)
Lymphocytes-Synovial Fld: 6 % (ref 0–74)
Monocyte/Macrophage: 90 % — ABNORMAL HIGH (ref 0–69)
Neutrophil, Synovial: 4 % (ref 0–24)
Synoviocytes, %: 0 % (ref 0–15)
WBC, Synovial: 598 {cells}/uL — ABNORMAL HIGH (ref <150)

## 2022-12-15 MED ORDER — TRIAMCINOLONE ACETONIDE 40 MG/ML IJ SUSP
40.0000 mg | INTRAMUSCULAR | Status: AC | PRN
Start: 2022-12-15 — End: 2022-12-15
  Administered 2022-12-15: 40 mg via INTRA_ARTICULAR

## 2022-12-15 MED ORDER — PREDNISONE 5 MG PO TABS: ORAL_TABLET | ORAL | 0 refills | Status: DC

## 2022-12-15 MED ORDER — LIDOCAINE HCL 1 % IJ SOLN
3.0000 mL | INTRAMUSCULAR | Status: AC | PRN
Start: 2022-12-15 — End: 2022-12-15
  Administered 2022-12-15: 3 mL

## 2022-12-15 MED ORDER — DICLOFENAC SODIUM 1 % EX GEL: 2.0000 g | Freq: Four times a day (QID) | CUTANEOUS | 2 refills | Status: DC

## 2022-12-15 NOTE — Patient Instructions (Signed)

## 2022-12-15 NOTE — Progress Notes (Signed)
Office Visit Note  Patient: Leon Mason             Date of Birth: [birth year]           MRN: [id number]             PCP: Raliegh Ip, DO Referring: Raliegh Ip, DO Visit Date: 12/15/2022 Occupation: @GUAROCC @  Subjective:  Left knee pain and swelling   History of Present Illness: Leon Mason is a 32 y.o. male with history of spondyloarthropathy and DDD. Patient remains on Humira 40 mg sq injections every 14 days-initiated Humira on 12/26/20.  Patient reports that about 5 to 6 weeks ago he was 1 week overdue for his Humira injection.  He states that since then he has been experiencing pain and swelling in the left knee.  He denies any injury or fall prior to the onset of symptoms.  Patient states that he has had 2 doses of Humira since but has not noticed any improvement yet.  He states he has tried ice, heat, a brace, Goody powder, and ibuprofen.  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 He denies any other joint pain or joint swelling at this time.     Activities of Daily Living:  Patient reports morning stiffness for 30 minutes.   Patient Reports nocturnal pain.  Difficulty dressing/grooming: Reports Difficulty climbing stairs: Reports Difficulty getting out of chair: Reports Difficulty using hands for taps, buttons, cutlery, and/or writing: Denies  Review of Systems  Constitutional:  Positive for fatigue.  HENT:  Negative for mouth sores and mouth dryness.   Eyes:  Negative for dryness.  Respiratory:  Negative for shortness of breath.   Cardiovascular:  Positive for chest pain. Negative for palpitations.  Gastrointestinal:  Negative for blood in stool, constipation and diarrhea.  Endocrine: Negative for increased urination.  Genitourinary:  Negative for involuntary urination.  Musculoskeletal:  Positive for joint pain, joint pain, joint swelling, myalgias, muscle  weakness, morning stiffness and myalgias. Negative for gait problem and muscle tenderness.  Skin:  Positive for sensitivity to sunlight. Negative for color change, rash and hair loss.  Allergic/Immunologic: Positive for susceptible to infections.  Neurological:  Positive for headaches. Negative for dizziness.  Hematological:  Negative for swollen glands.  Psychiatric/Behavioral:  Positive for depressed mood. Negative for sleep disturbance. The patient is nervous/anxious.     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   Back pain without sciatica 02/16/2021   Neck pain 02/16/2021   Generalized joint pain 10/21/2020   Generalized abdominal pain 10/21/2020   Mild intermittent asthma with (acute) exacerbation 02/18/2019   Hypokalemia 02/18/2019   Normocytic anemia 02/18/2019   Elevated transaminase level 02/18/2019   Prolonged QT interval 02/18/2019   Viral syndrome 02/18/2019   SIRS (systemic inflammatory response syndrome) (HCC) 02/18/2019   Globus sensation 07/04/2018   Schizophrenia (HCC) 03/28/2018   MDD (major depressive disorder), recurrent episode, severe (HCC) 03/28/2018   Schizo-affective schizophrenia, chronic condition with acute exacerbation (HCC) 03/28/2018   Tobacco use disorder 12/17/2016   Schizoaffective disorder, bipolar type (HCC) 08/23/2016   Alcohol use disorder, severe, dependence (HCC) 08/23/2016   Allergic rhinitis due to pollen 10/09/2015   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   Mild persistent allergic asthma 10/09/2015   Psychosis (HCC) 06/14/2013    Past Medical History:  Diagnosis Date   Anxiety  Asthma    Bipolar 1 disorder (HCC)    Hallucinations    Schizoaffective disorder (HCC)    Schizophrenia (HCC)     Family History  Problem Relation Age of Onset   Bipolar disorder Mother    Alcoholism Mother    Diabetes Mother    Cervical cancer Mother    OCD Father    Bipolar disorder Father     Eczema Brother    Post-traumatic stress disorder Brother    COPD Maternal Aunt    Heart attack Maternal Grandmother    Breast cancer Maternal Grandmother    Heart attack Maternal Grandfather    Cancer Paternal Grandmother    Heart attack Paternal Grandfather    Asthma Neg Hx    Allergic rhinitis Neg Hx    Angioedema Neg Hx    Urticaria Neg Hx    Immunodeficiency Neg Hx    Atopy Neg Hx    Prostate cancer Neg Hx    Colon cancer Neg Hx    Past Surgical History:  Procedure Laterality Date   ingrown toenail Right 10/21/2022   great toe   WISDOM TOOTH EXTRACTION  2009   all four   Social History   Social History Narrative   Not on file   Immunization History  Administered Date(s) Administered   DTaP 01/03/1990, 03/17/1990, 06/08/1990, 02/20/1991, 06/01/1994   HIB (PRP-OMP) 01/03/1990, 03/17/1990, 06/08/1990, 02/20/1991   Hepatitis B 01/05/2001, 02/01/2001, 05/04/2005   Hepatitis B, PED/ADOLESCENT 01/05/2001, 02/01/2001, 05/04/2005   IPV 01/03/1990, 03/17/1990, 02/20/1991, 06/01/1994   Influenza,inj,Quad PF,6+ Mos 02/20/2019, 02/16/2021   Influenza,inj,quad, With Preservative 03/18/2018   Influenza-Unspecified 03/18/2018   MMR 02/20/1991, 06/01/1994   Meningococcal Conjugate 05/04/2005   Pneumococcal Polysaccharide-23 08/29/2016, 02/20/2019   Tdap 05/04/2005, 12/26/2019     Objective: Vital Signs: BP 134/78 (BP Location: Left Arm, Patient Position: Sitting, Cuff Size: Normal)   Pulse 88   Resp 15   Ht 6\' 3"  (1.905 m)   Wt (!) 317 lb (143.8 kg)   BMI 39.62 kg/m    Physical Exam Vitals and nursing note reviewed.  Constitutional:      Appearance: He is well-developed.  HENT:     Head: Normocephalic and atraumatic.  Eyes:     Conjunctiva/sclera: Conjunctivae normal.     Pupils: Pupils are equal, round, and reactive to light.  Cardiovascular:     Rate and Rhythm: Normal rate and regular rhythm.     Heart sounds: Normal heart sounds.  Pulmonary:     Effort:  Pulmonary effort is normal.     Breath sounds: Normal breath sounds.  Abdominal:     General: Bowel sounds are normal.     Palpations: Abdomen is soft.  Musculoskeletal:     Cervical back: Normal range of motion and neck supple.  Skin:    General: Skin is warm and dry.     Capillary Refill: Capillary refill takes less than 2 seconds.  Neurological:     Mental Status: He is alert and oriented to person, place, and time.  Psychiatric:        Behavior: Behavior normal.      Musculoskeletal Exam: C-spine has limited ROM with lateral rotation. Shoulder joints, elbow joints, wrist joints, MCPs, PIPs, and DIPs good ROM with no synovitis.  Moderate to large effusion left knee.  Right knee has good ROM with no warmth or effusion.  Ankle joints have good ROM with some tenderness of the right ankle.  No evidence of achilles tendonitis.  CDAI Exam: CDAI Score: -- Patient Global: --; Provider Global: -- Swollen: --; Tender: -- Joint Exam 12/15/2022   No joint exam has been documented for this visit   There is currently no information documented on the homunculus. Go to the Rheumatology activity and complete the homunculus joint exam.  Investigation: No additional findings.  Imaging: XR KNEE 3 VIEW LEFT  Result Date: 12/15/2022 Moderate medial compartment narrowing and moderate patellofemoral narrowing was noted. Impression: These findings are suggestive of moderate osteoarthritis and moderate chondromalacia patella.   Recent Labs: Lab Results  Component Value Date   WBC 5.9 09/22/2022   HGB 14.8 09/22/2022   PLT 181 09/22/2022   NA 138 09/22/2022   K 4.3 09/22/2022   CL 99 09/22/2022   CO2 23 09/22/2022   GLUCOSE 86 09/22/2022   BUN 15 09/22/2022   CREATININE 0.96 09/22/2022   BILITOT 0.4 09/22/2022   ALKPHOS 69 09/22/2022   AST 41 (H) 09/22/2022   ALT 54 (H) 09/22/2022   PROT 7.6 09/22/2022   ALBUMIN 4.6 09/22/2022   CALCIUM 9.7 09/22/2022   GFRAA >60 02/19/2019    QFTBGOLDPLUS NEGATIVE 08/23/2022    Speciality Comments: Humira started December 26, 2020  Procedures:  Large Joint Inj: L knee on 12/15/2022 2:54 PM Indications: pain Details: 27 G 1.5 in needle, medial approach  Arthrogram: No  Medications: 3 mL lidocaine 1 %; 40 mg triamcinolone acetonide 40 MG/ML Aspirate: 43 mL clear; sent for lab analysis Outcome: tolerated well, no immediate complications Procedure, treatment alternatives, risks and benefits explained, specific risks discussed. Consent was given by the patient. Immediately prior to procedure a time out was called to verify the correct patient, procedure, equipment, support staff and site/side marked as required. Patient was prepped and draped in the usual sterile fashion.     Allergies: Other, Dust mite extract, and Pollen extract    Assessment / Plan:     Visit Diagnoses: Spondyloarthropathy - Inflammatory arthritis, iritis, chronic SI joint pain, chronic pain in neck, thoracic and lumbar region.  HLA-B27 positive: Patient presents today experiencing a flare involving the left knee.  About 5 to 6 weeks ago he was 1 week overdue for his Humira injection and since then has been experiencing ongoing pain and swelling involving the left knee.  He has had to Humira injections since the flare started and has also tried ibuprofen, Goody powder, a brace, ice, and heat with minimal to no improvement in his symptoms.  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  On examination today he has a moderate to large effusion in the left knee.  X-rays of the left knee were updated today and after informed consent the left knee joint effusion was aspirated (43 ml fluid) and injected with cortisone.  Procedure note was completed above.  Synovial fluid was sent for analysis.  Patient was given a work note to return to work Friday at the earliest and to try to perform light duty until symptoms  improved.  A prednisone taper starting at 20 mg tapering by 5 mg every 4 days was sent to the pharmacy.  He was advised to notify us if his symptoms persist or worsen.  He will remain on Humira 40 mg sq injections once every 14 days.  He will follow up in the office in 3 months or sooner if needed.   High risk medication use - Humira 40 mg sq injections every 14 days.  CBC and CMP were updated on 09/22/2022.  Orders for CBC and CMP were released today.  His next lab work will be due in January and every 3 months to monitor for drug toxicity. TB gold negative on 08/23/2022. Discussed the importance of holding Humira if he develops signs or symptoms of an infection and to resume once the infection has completely cleared. Plan: COMPLETE METABOLIC PANEL WITH GFR, CBC with Differential/Platelet  HLA B27 positive  Iritis: Left eye-intermittent blurry vision. No conjunctival injection.  No photophobia or eye pain currently.  He will remain on Humira 40 mg subcu days injections every 14 days.  He was advised to notify us if he starts to have recurrent flares.  Entrapment of left ulnar nerve - Evaluated by Dr. August Saucer. NCV with EMG on 10/13/21.  Chronic pain of left knee: X-rays of the left knee from 12/04/2020 were consistent with moderate osteoarthritis and inflammatory arthritis.  Patient presents today with acute on chronic pain involving the left knee.  No recent injury or fall.  No mechanical symptoms.  Patient symptoms started about 6 weeks ago after being about 1 week overdue for his Humira injection.  He has tried [place] powder, ibuprofen, heat, ice, and bracing.  He has had 2 doses of Humira since the flare started and has not noticed any improvement in his symptoms yet.  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  He will benefit from time off of work.  A work note was provided for him to return Friday at the earliest at light duty until his symptoms  improve. He has a moderate to large effusion in the left knee.  X-rays of the left knee were updated today.  After informed consent the left knee joint was aspirated and injected with cortisone.  43 ml fluid aspirated and sent for analysis.  Procedure note was completed above.  Aftercare was discussed. A prednisone taper was into the pharmacy today starting at 20 mg tapering by 5 mg every 4 days.  Advised patient to take prednisone first thing in the morning to avoid the use of NSAIDs.  He will notify us if his symptoms persist or worsen.   He was advised to notify us if his symptoms persist or worsen.  Acute pain of left knee -He presents today with acute pain involving the left knee.  He has a moderate to large effusion.  Aspiration and cortisone injection was performed today.  X-rays of the left knee will be updated today prior to the procedure.  He will notify us if his symptoms persist or worsen.  A refill of Voltaren gel was sent to the pharmacy.  A prednisone taper sent to the pharmacy as discussed above.  Plan: XR KNEE 3 VIEW LEFT  DDD (degenerative disc disease), cervical: Limited ROM with lateral rotation.   Pain in thoracic spine: No discomfort at this time.   Chronic SI joint pain: Not currently symptomatic.   Other medical conditions are listed as follows:   Other fatigue  Schizoaffective disorder, bipolar type (HCC)  SIRS (systemic inflammatory response syndrome) (HCC)  Prolonged QT interval  Palpitations  Mild persistent allergic asthma  Alcohol use disorder, severe, dependence (HCC)  Smoker  Allergic urticaria    Orders: Orders Placed This Encounter  Procedures   Large Joint Inj: L knee   XR KNEE 3 VIEW LEFT   COMPLETE METABOLIC PANEL WITH GFR   CBC with Differential/Platelet   Synovial Fluid Analysis,  Complete   Meds ordered this encounter  Medications   diclofenac Sodium (VOLTAREN) 1 % GEL    Sig: Apply 2 g topically 4 (four) times daily.     Dispense:  400 g    Refill:  2   predniSONE (DELTASONE) 5 MG tablet    Sig: Take 4 tabs po qd x 4 days, 3  tabs po qd x 4 days, 2  tabs po qd x 4 days, 1  tab po qd x 4 days    Dispense:  40 tablet    Refill:  0     Follow-Up Instructions: Return in about 3 months (around 03/17/2023) for Spondyloarthropathy.   Gearldine Bienenstock, PA-C  Note - This record has been created using Dragon software.  Chart creation errors have been sought, but may not always  have been located. Such creation errors do not reflect on  the standard of medical care.

## 2022-12-16 LAB — CBC WITH DIFFERENTIAL/PLATELET
Absolute Monocytes: 489 {cells}/uL (ref 200–950)
Basophils Absolute: 73 {cells}/uL (ref 0–200)
Basophils Relative: 1 %
Eosinophils Absolute: 314 {cells}/uL (ref 15–500)
Eosinophils Relative: 4.3 %
HCT: 45.4 % (ref 38.5–50.0)
Hemoglobin: 15.5 g/dL (ref 13.2–17.1)
Lymphs Abs: 2373 {cells}/uL (ref 850–3900)
MCH: 30.9 pg (ref 27.0–33.0)
MCHC: 34.1 g/dL (ref 32.0–36.0)
MCV: 90.6 fL (ref 80.0–100.0)
MPV: 12.2 fL (ref 7.5–12.5)
Monocytes Relative: 6.7 %
Neutro Abs: 4052 {cells}/uL (ref 1500–7800)
Neutrophils Relative %: 55.5 %
Platelets: 206 10*3/uL (ref 140–400)
RBC: 5.01 10*6/uL (ref 4.20–5.80)
RDW: 13.3 % (ref 11.0–15.0)
Total Lymphocyte: 32.5 %
WBC: 7.3 10*3/uL (ref 3.8–10.8)

## 2022-12-16 LAB — COMPLETE METABOLIC PANEL WITH GFR
AG Ratio: 1.5 (calc) (ref 1.0–2.5)
ALT: 64 U/L — ABNORMAL HIGH (ref 9–46)
AST: 41 U/L — ABNORMAL HIGH (ref 10–40)
Albumin: 4.7 g/dL (ref 3.6–5.1)
Alkaline phosphatase (APISO): 66 U/L (ref 36–130)
BUN: 17 mg/dL (ref 7–25)
CO2: 26 mmol/L (ref 20–32)
Calcium: 9.8 mg/dL (ref 8.6–10.3)
Chloride: 101 mmol/L (ref 98–110)
Creat: 1 mg/dL (ref 0.60–1.26)
Globulin: 3.1 g/dL (ref 1.9–3.7)
Glucose, Bld: 77 mg/dL (ref 65–99)
Potassium: 3.7 mmol/L (ref 3.5–5.3)
Sodium: 137 mmol/L (ref 135–146)
Total Bilirubin: 0.5 mg/dL (ref 0.2–1.2)
Total Protein: 7.8 g/dL (ref 6.1–8.1)
eGFR: 102 mL/min/{1.73_m2} (ref >=60)

## 2022-12-16 NOTE — Progress Notes (Signed)
No crystals present in synovial fluid.  Results consistent with inflammation.    CBC WNL.  AST and ALT remain elevated-avoid tylenol and alcohol use. Rest of CMP WNL.

## 2022-12-27 ENCOUNTER — Ambulatory Visit: Payer: MEDICAID | Admitting: Family Medicine

## 2022-12-27 ENCOUNTER — Encounter: Payer: Self-pay | Admitting: Family Medicine

## 2022-12-27 NOTE — Progress Notes (Deleted)
Subjective: CC:*** PCP: Raliegh Ip, DO [id number] Leon Mason is Leon 32 y.o. male presenting to clinic today for:  1. ***   ROS: Per HPI  Allergies  Allergen Reactions   Other Hives and Shortness Of Breath    Dogs and Cats.   Dust Mite Extract    Pollen Extract    Past Medical History:  Diagnosis Date   Anxiety    Asthma    Bipolar 1 disorder (HCC)    Hallucinations    Mild intermittent asthma with (acute) exacerbation 02/18/2019   Psychosis (HCC) 06/14/2013   Schizo-affective schizophrenia, chronic condition with acute exacerbation (HCC) 03/28/2018   Schizoaffective disorder (HCC)    Schizophrenia (HCC)    SIRS (systemic inflammatory response syndrome) (HCC) 02/18/2019    Current Outpatient Medications:    adalimumab (HUMIRA, 2 PEN,) 40 MG/0.4ML pen, INJECT 1 PEN SUBCUTANEOUSLY ONCE EVERY 14 DAYS, Disp: 6 each, Rfl: 0   Albuterol Sulfate (PROAIR HFA IN), Inhale into the lungs as needed., Disp: , Rfl:    diclofenac Sodium (VOLTAREN) 1 % GEL, Apply 2 g topically 4 (four) times daily., Disp: 400 g, Rfl: 2   diphenhydrAMINE-Zinc Acetate (BENADRYL EX), Apply topically as needed. (Patient not taking: Reported on 12/15/2022), Disp: , Rfl:    fluticasone-salmeterol (ADVAIR) 250-50 MCG/ACT AEPB, Inhale 1 puff into the lungs daily., Disp: , Rfl:    folic acid (FOLVITE) 1 MG tablet, Take 1 tablet (1 mg total) by mouth daily., Disp: 90 tablet, Rfl: 1   IBUPROFEN PO, Take by mouth as needed., Disp: , Rfl:    Multiple Vitamin (MULTIVITAMIN PO), Take by mouth daily., Disp: , Rfl:    perphenazine (TRILAFON) 4 MG tablet, Take 4 mg by mouth 2 (two) times daily., Disp: , Rfl:    predniSONE (DELTASONE) 5 MG tablet, Take 4 tabs po qd x 4 days, 3  tabs po qd x 4 days, 2  tabs po qd x 4 days, 1  tab po qd x 4 days, Disp: 40 tablet, Rfl: 0   sertraline (ZOLOFT) 50 MG tablet, Take 1 tablet (50 mg total) by mouth daily., Disp: 90 tablet, Rfl: 3   thiamine (VITAMIN B1) 100 MG tablet, Take 1  tablet (100 mg total) by mouth daily., Disp: 90 tablet, Rfl: 1   triamcinolone cream (KENALOG) 0.1 %, Apply 1 Application topically 2 (two) times daily as needed (poison oak/ivy rash (max 10 days per use))., Disp: 80 g, Rfl: 0   UNABLE TO FIND, Med Name: Bug spray topically, daily use., Disp: , Rfl:  Social History   Socioeconomic History   Marital status: Single    Spouse name: Not on file   Number of children: Not on file   Years of education: Not on file   Highest education level: Associate degree: occupational, Scientist, product/process development, or vocational program  Occupational History   Not on file  Tobacco Use   Smoking status: Former    Current packs/day: 0.00    Types: Cigarettes    Start date: 11/27/2017    Quit date: 05/28/2018    Years since quitting: 4.5    Passive exposure: Current   Smokeless tobacco: Never  Vaping Use   Vaping status: Former  Substance and Sexual Activity   Alcohol use: Yes    Alcohol/week: 14.0 standard drinks of alcohol    Types: 14 Cans of beer per week   Drug use: Not Currently    Types: Marijuana   Sexual activity: Not Currently  Other Topics  Concern   Not on file  Social History Narrative   Not on file   Social Determinants of Health   Financial Resource Strain: High Risk (12/24/2022)   Overall Financial Resource Strain (CARDIA)    Difficulty of Paying Living Expenses: Very hard  Food Insecurity: Food Insecurity Present (12/24/2022)   Hunger Vital Sign    Worried About Programme researcher, broadcasting/film/video in the Last Year: Often true    Ran Out of Food in the Last Year: Often true  Transportation Needs: Unmet Transportation Needs (12/24/2022)   PRAPARE - Administrator, Civil Service (Medical): Yes    Lack of Transportation (Non-Medical): Yes  Physical Activity: Sufficiently Active (12/24/2022)   Exercise Vital Sign    Days of Exercise per Week: 5 days    Minutes of Exercise per Session: 120 min  Stress: Stress Concern Present (12/24/2022)   [employer] of Occupational Health - Occupational Stress Questionnaire    Feeling of Stress : Very much  Social Connections: Socially Isolated (12/24/2022)   Social Connection and Isolation Panel [NHANES]    Frequency of Communication with Friends and Family: Never    Frequency of Social Gatherings with Friends and Family: Never    Attends Religious Services: Never    Database administrator or Organizations: Yes    Attends Engineer, structural: More than 4 times per year    Marital Status: Never married  Intimate Partner Violence: Unknown (06/12/2021)   Received from [employer], [facility]   HITS    Physically Hurt: Not on file    Insult or Talk Down To: Not on file    Threaten Physical Harm: Not on file    Scream or Curse: Not on file   Family History  Problem Relation Age of Onset   Bipolar disorder Mother    Alcoholism Mother    Diabetes Mother    Cervical cancer Mother    OCD Father    Bipolar disorder Father    Eczema Brother    Post-traumatic stress disorder Brother    COPD Maternal Aunt    Heart attack Maternal Grandmother    Breast cancer Maternal Grandmother    Heart attack Maternal Grandfather    Cancer Paternal Grandmother    Heart attack Paternal Grandfather    Asthma Neg Hx    Allergic rhinitis Neg Hx    Angioedema Neg Hx    Urticaria Neg Hx    Immunodeficiency Neg Hx    Atopy Neg Hx    Prostate cancer Neg Hx    Colon cancer Neg Hx     Objective: Office vital signs reviewed. There were no vitals taken for this visit.  Physical Examination:  General: Awake, alert, *** nourished, No acute distress HEENT: Normal    Neck: No masses palpated. No lymphadenopathy    Ears: Tympanic membranes intact, normal light reflex, no erythema, no bulging    Eyes: PERRLA, extraocular membranes intact, sclera ***    Nose: nasal turbinates moist, *** nasal discharge    Throat: moist mucus membranes, no erythema, *** tonsillar exudate.  Airway is  patent Cardio: regular rate and rhythm, S1S2 heard, no murmurs appreciated Pulm: clear to auscultation bilaterally, no wheezes, rhonchi or rales; normal work of breathing on room air GI: soft, non-tender, non-distended, bowel sounds present x4, no hepatomegaly, no splenomegaly, no masses GU: external vaginal tissue ***, cervix ***, *** punctate lesions on cervix appreciated, *** discharge from cervical os, *** bleeding, *** cervical  motion tenderness, *** abdominal/ adnexal masses Extremities: warm, well perfused, No edema, cyanosis or clubbing; +*** pulses bilaterally MSK: *** gait and *** station Skin: dry; intact; no rashes or lesions Neuro: *** Strength and light touch sensation grossly intact, *** DTRs ***/4  Assessment/ Plan: 32 y.o. male   No diagnosis found.  ***   Leon Barg Hulen Skains, DO [facility] [phone]

## 2022-12-28 ENCOUNTER — Ambulatory Visit (INDEPENDENT_AMBULATORY_CARE_PROVIDER_SITE_OTHER): Payer: MEDICAID | Admitting: Family Medicine

## 2022-12-28 ENCOUNTER — Encounter: Payer: Self-pay | Admitting: Family Medicine

## 2022-12-28 VITALS — BP 139/84 | HR 80 | Temp 98.8°F | Ht 75.0 in | Wt 316.0 lb

## 2022-12-28 DIAGNOSIS — F332 Major depressive disorder, recurrent severe without psychotic features: Secondary | ICD-10-CM

## 2022-12-28 DIAGNOSIS — F250 Schizoaffective disorder, bipolar type: Secondary | ICD-10-CM | POA: Diagnosis not present

## 2022-12-28 DIAGNOSIS — F439 Reaction to severe stress, unspecified: Secondary | ICD-10-CM

## 2022-12-28 DIAGNOSIS — Z23 Encounter for immunization: Secondary | ICD-10-CM | POA: Diagnosis not present

## 2022-12-28 DIAGNOSIS — F1020 Alcohol dependence, uncomplicated: Secondary | ICD-10-CM | POA: Diagnosis not present

## 2022-12-28 DIAGNOSIS — Z5986 Financial insecurity: Secondary | ICD-10-CM

## 2022-12-28 MED ORDER — ALBUTEROL SULFATE HFA 108 (90 BASE) MCG/ACT IN AERS: 2.0000 | INHALATION_SPRAY | Freq: Four times a day (QID) | RESPIRATORY_TRACT | 0 refills | Status: AC | PRN

## 2022-12-28 NOTE — Addendum Note (Signed)
Addended by: Raliegh Ip on: 12/28/2022 01:05 PM   Modules accepted: Orders

## 2022-12-28 NOTE — Progress Notes (Signed)
Subjective: CC: Chronic follow-up PCP: Raliegh Ip, DO ZOX:[id number] Leon Mason is Leon 32 y.o. male presenting to clinic today for:  1.  Schizophrenia with alcohol dependence Patient continues to see his psychiatrist and psychologist in [place].  He has not switched any other offices because he worries that he will simply get Leon worse office with having had Medicaid instead of private insurance.  He admits that he has not been in Leon good place lately and sometimes feels like giving up.  He has Leon very complex social situation at home where his brother and mother are narcissist and leach off of him.  He is the primary breadwinner in the home but feels like he is stuck in debt because he cannot get out of the house due to bad credit.  He works and his goal is to be fully independent and have Leon family someday but he feels like his government is failing him because so any barriers arise to him gaining independence from his current situation.  He is interested in potentially talking to Leon different specialist/counselor if possible.  He feels like he needs all the support that he can get and would like to have talk therapy with someone else.  He is currently in Leon few different clubs, trying to socialize and find joy in life.   ROS: Per HPI  Allergies  Allergen Reactions   Other Hives and Shortness Of Breath    Dogs and Cats.   Dust Mite Extract    Pollen Extract    Past Medical History:  Diagnosis Date   Anxiety    Asthma    Bipolar 1 disorder (HCC)    Hallucinations    Mild intermittent asthma with (acute) exacerbation 02/18/2019   Psychosis (HCC) 06/14/2013   Schizo-affective schizophrenia, chronic condition with acute exacerbation (HCC) 03/28/2018   Schizoaffective disorder (HCC)    Schizophrenia (HCC)    SIRS (systemic inflammatory response syndrome) (HCC) 02/18/2019    Current Outpatient Medications:    adalimumab (HUMIRA, 2 PEN,) 40 MG/0.4ML pen, INJECT 1 PEN SUBCUTANEOUSLY ONCE  EVERY 14 DAYS, Disp: 6 each, Rfl: 0   Albuterol Sulfate (PROAIR HFA IN), Inhale into the lungs as needed., Disp: , Rfl:    diclofenac Sodium (VOLTAREN) 1 % GEL, Apply 2 g topically 4 (four) times daily., Disp: 400 g, Rfl: 2   diphenhydrAMINE-Zinc Acetate (BENADRYL EX), Apply topically as needed., Disp: , Rfl:    fluticasone-salmeterol (ADVAIR) 250-50 MCG/ACT AEPB, Inhale 1 puff into the lungs daily., Disp: , Rfl:    folic acid (FOLVITE) 1 MG tablet, Take 1 tablet (1 mg total) by mouth daily., Disp: 90 tablet, Rfl: 1   IBUPROFEN PO, Take by mouth as needed., Disp: , Rfl:    Multiple Vitamin (MULTIVITAMIN PO), Take by mouth daily., Disp: , Rfl:    perphenazine (TRILAFON) 4 MG tablet, Take 4 mg by mouth 2 (two) times daily., Disp: , Rfl:    predniSONE (DELTASONE) 5 MG tablet, Take 4 tabs po qd x 4 days, 3  tabs po qd x 4 days, 2  tabs po qd x 4 days, 1  tab po qd x 4 days, Disp: 40 tablet, Rfl: 0   sertraline (ZOLOFT) 50 MG tablet, Take 1 tablet (50 mg total) by mouth daily., Disp: 90 tablet, Rfl: 3   thiamine (VITAMIN B1) 100 MG tablet, Take 1 tablet (100 mg total) by mouth daily., Disp: 90 tablet, Rfl: 1   triamcinolone cream (KENALOG) 0.1 %, Apply 1  Application topically 2 (two) times daily as needed (poison oak/ivy rash (max 10 days per use))., Disp: 80 g, Rfl: 0   UNABLE TO FIND, Med Name: Bug spray topically, daily use., Disp: , Rfl:  Social History   Socioeconomic History   Marital status: Single    Spouse name: Not on file   Number of children: Not on file   Years of education: Not on file   Highest education level: Associate degree: occupational, Scientist, product/process development, or vocational program  Occupational History   Not on file  Tobacco Use   Smoking status: Former    Current packs/day: 0.00    Types: Cigarettes    Start date: 11/27/2017    Quit date: 05/28/2018    Years since quitting: 4.5    Passive exposure: Current   Smokeless tobacco: Never  Vaping Use   Vaping status: Former  Substance  and Sexual Activity   Alcohol use: Yes    Alcohol/week: 14.0 standard drinks of alcohol    Types: 14 Cans of beer per week   Drug use: Not Currently    Types: Marijuana   Sexual activity: Not Currently  Other Topics Concern   Not on file  Social History Narrative   Not on file   Social Determinants of Health   Financial Resource Strain: High Risk (12/24/2022)   Overall Financial Resource Strain (CARDIA)    Difficulty of Paying Living Expenses: Very hard  Food Insecurity: Food Insecurity Present (12/24/2022)   Hunger Vital Sign    Worried About Programme researcher, broadcasting/film/video in the Last Year: Often true    Ran Out of Food in the Last Year: Often true  Transportation Needs: Unmet Transportation Needs (12/24/2022)   PRAPARE - Administrator, Civil Service (Medical): Yes    Lack of Transportation (Non-Medical): Yes  Physical Activity: Sufficiently Active (12/24/2022)   Exercise Vital Sign    Days of Exercise per Week: 5 days    Minutes of Exercise per Session: 120 min  Stress: Stress Concern Present (12/24/2022)   [employer] of Occupational Health - Occupational Stress Questionnaire    Feeling of Stress : Very much  Social Connections: Socially Isolated (12/24/2022)   Social Connection and Isolation Panel [NHANES]    Frequency of Communication with Friends and Family: Never    Frequency of Social Gatherings with Friends and Family: Never    Attends Religious Services: Never    Database administrator or Organizations: Yes    Attends Engineer, structural: More than 4 times per year    Marital Status: Never married  Intimate Partner Violence: Unknown (06/12/2021)   Received from [employer], [facility]   HITS    Physically Hurt: Not on file    Insult or Talk Down To: Not on file    Threaten Physical Harm: Not on file    Scream or Curse: Not on file   Family History  Problem Relation Age of Onset   Bipolar disorder Mother    Alcoholism Mother    Diabetes  Mother    Cervical cancer Mother    OCD Father    Bipolar disorder Father    Eczema Brother    Post-traumatic stress disorder Brother    COPD Maternal Aunt    Heart attack Maternal Grandmother    Breast cancer Maternal Grandmother    Heart attack Maternal Grandfather    Cancer Paternal Grandmother    Heart attack Paternal Grandfather    Asthma Neg Hx  Allergic rhinitis Neg Hx    Angioedema Neg Hx    Urticaria Neg Hx    Immunodeficiency Neg Hx    Atopy Neg Hx    Prostate cancer Neg Hx    Colon cancer Neg Hx     Objective: Office vital signs reviewed. BP 139/84   Pulse 80   Temp 98.8 F (37.1 C)   Ht 6\' 3"  (1.905 m)   Wt (!) 316 lb (143.3 kg)   SpO2 97%   BMI 39.50 kg/m   Physical Examination:  General: Awake, alert, obese, No acute distress HEENT: Sclera white.  Moist mucous membranes Psych: Mood is depressed.  Eye contact poor.  He is very pleasant, interactive and does not appear to be responding to internal stimuli.  Thought process is linear and his speech is normal     12/28/2022    9:40 AM 09/22/2022    2:35 PM 06/15/2021    2:17 PM  Depression screen PHQ 2/9  Decreased Interest 3 2 2   Down, Depressed, Hopeless 3 1 1   PHQ - 2 Score 6 3 3   Altered sleeping 3 0 1  Tired, decreased energy 1 1 0  Change in appetite 3 2 2   Feeling bad or failure about yourself  3 2 0  Trouble concentrating 2 0 0  Moving slowly or fidgety/restless 0 0 0  Suicidal thoughts 2 1 0  PHQ-9 Score 20 9 6   Difficult doing work/chores Extremely dIfficult Very difficult       12/28/2022    9:40 AM 09/22/2022    2:35 PM 06/15/2021    2:17 PM 05/29/2021   10:51 AM  GAD 7 : Generalized Anxiety Score  Nervous, Anxious, on Edge 3 2 1 2   Control/stop worrying 3 2 1 1   Worry too much - different things 3 2 1 1   Trouble relaxing 3 2 1 1   Restless 0 1 0 0  Easily annoyed or irritable 3 2 2 2   Afraid - awful might happen 3 2 2 1   Total GAD 7 Score 18 13 8 8   Anxiety Difficulty  Extremely difficult Somewhat difficult  Somewhat difficult    Assessment/ Plan: 32 y.o. male   Schizoaffective disorder, bipolar type (HCC) - Plan: Ambulatory referral to Integrated Behavioral Health, AMB Referral VBCI Care Management  Alcohol use disorder, severe, dependence (HCC) - Plan: AMB Referral VBCI Care Management  Severe episode of recurrent major depressive disorder, without psychotic features (HCC) - Plan: Ambulatory referral to Integrated Behavioral Health, AMB Referral VBCI Care Management  Encounter for immunization - Plan: Flu vaccine trivalent PF, 6mos and older(Flulaval,Afluria,Fluarix,Fluzone)  Stress at home - Plan: Ambulatory referral to Integrated Behavioral Health, AMB Referral VBCI Care Management  Financial insecurity due to debt - Plan: Ambulatory referral to Integrated Behavioral Health, AMB Referral VBCI Care Management  He is quite depressed and understandably so.  His social situation is quite complex niece had Leon lot of barriers to gaining independent living.  We talked about seeing social services and maybe even talking to the Housing Authority to see if there are options for persons like himself that he may be able to utilize.  I will also get him connected to case management/clinical social work and our integrated behavioral health specialist for counseling.  I think he could use the actual emotional support given his poor family situation  I counseled him on his alcohol dependence and the risks that this poses going forward.  He is motivated to wean from  alcohol but right now admits that he is using it for an escape  Influenza vaccination administered  I like to try and see him back again in the next 3 months just to check in on him.  Patient and/or legal guardian verbally consented to [facility] services about presenting concerns and psychiatric consultation as appropriate.  The services will be billed as appropriate for the  patient    Raliegh Ip, DO [facility] [phone]

## 2022-12-30 ENCOUNTER — Ambulatory Visit: Payer: MEDICAID | Admitting: Dermatology

## 2022-12-30 ENCOUNTER — Encounter: Payer: Self-pay | Admitting: Dermatology

## 2022-12-30 VITALS — BP 138/75 | HR 60

## 2022-12-30 DIAGNOSIS — L814 Other melanin hyperpigmentation: Secondary | ICD-10-CM

## 2022-12-30 DIAGNOSIS — Z808 Family history of malignant neoplasm of other organs or systems: Secondary | ICD-10-CM

## 2022-12-30 DIAGNOSIS — W908XXA Exposure to other nonionizing radiation, initial encounter: Secondary | ICD-10-CM | POA: Diagnosis not present

## 2022-12-30 DIAGNOSIS — L821 Other seborrheic keratosis: Secondary | ICD-10-CM | POA: Diagnosis not present

## 2022-12-30 DIAGNOSIS — Z1283 Encounter for screening for malignant neoplasm of skin: Secondary | ICD-10-CM | POA: Diagnosis not present

## 2022-12-30 DIAGNOSIS — D229 Melanocytic nevi, unspecified: Secondary | ICD-10-CM

## 2022-12-30 DIAGNOSIS — D1801 Hemangioma of skin and subcutaneous tissue: Secondary | ICD-10-CM

## 2022-12-30 DIAGNOSIS — L578 Other skin changes due to chronic exposure to nonionizing radiation: Secondary | ICD-10-CM

## 2022-12-30 NOTE — Progress Notes (Signed)
   New Patient Visit   Subjective  Leon Mason is a 32 y.o. male who presents for the following: Skin Cancer Screening and Full Body Skin Exam  The patient presents for Total-Body Skin Exam (TBSE) for skin cancer screening and mole check. The patient has spots, moles and lesions to be evaluated, some may be new or changing. Pt has no hx of skin cancer but maternal gm had MM  Patient is a landscaper.   The following portions of the chart were reviewed this encounter and updated as appropriate: medications, allergies, medical history  Review of Systems:  No other skin or systemic complaints except as noted in HPI or Assessment and Plan.  Objective  Well appearing patient in no apparent distress; mood and affect are within normal limits.  A full examination was performed including scalp, head, eyes, ears, nose, lips, neck, chest, axillae, abdomen, back, buttocks, bilateral upper extremities, bilateral lower extremities, hands, feet, fingers, toes, fingernails, and toenails. All findings within normal limits unless otherwise noted below.   Relevant physical exam findings are noted in the Assessment and Plan.    Assessment & Plan   SKIN CANCER SCREENING PERFORMED TODAY.  ACTINIC DAMAGE - Chronic condition, secondary to cumulative UV/sun exposure - diffuse scaly erythematous macules with underlying dyspigmentation - Recommend daily broad spectrum sunscreen SPF 30+ to sun-exposed areas, reapply every 2 hours as needed.  - Staying in the shade or wearing long sleeves, sun glasses (UVA+UVB protection) and wide brim hats (4-inch brim around the entire circumference of the hat) are also recommended for sun protection.  - Call for new or changing lesions.  MELANOCYTIC NEVI - Tan-brown and/or pink-flesh-colored symmetric macules and papules - Benign appearing on exam today - Observation - Call clinic for new or changing moles - Recommend daily use of broad spectrum spf 30+ sunscreen to  sun-exposed areas.   SEBORRHEIC KERATOSIS - Stuck-on, waxy, tan-brown papules and/or plaques  - Benign-appearing - Discussed benign etiology and prognosis. - Observe - Call for any changes  LENTIGINES Exam: scattered tan macules Due to sun exposure Treatment Plan: Benign-appearing, observe. Recommend daily broad spectrum sunscreen SPF 30+ to sun-exposed areas, reapply every 2 hours as needed.  Call for any changes   HEMANGIOMA Exam: red papule(s) Discussed benign nature. Recommend observation. Call for changes.       Return in about 2 years (around 12/29/2024) for TBSE.  I, Tillie Fantasia, CMA, am acting as scribe for [name], MD.   Documentation: I have reviewed the above documentation for accuracy and completeness, and I agree with the above.  [name], MD

## 2022-12-30 NOTE — Patient Instructions (Addendum)

## 2023-01-03 ENCOUNTER — Other Ambulatory Visit: Payer: Self-pay | Admitting: Licensed Clinical Social Worker

## 2023-01-03 NOTE — Patient Outreach (Signed)
Medicaid Managed Care Social Work Note  01/03/2023 Name:  SENDER STARR MRN:  [id number] DOB:  December 12, 1990  Basilia Jumbo Namba is an 32 y.o. year old male who is a primary patient of Raliegh Ip, DO.  The Medicaid Managed Care Coordination team was consulted for assistance with:  Mental Health Counseling and Resources  Mr. Yow was given information about Medicaid Managed Care Coordination team services today. Basilia Jumbo [place] Patient did not agree to services.  Patient provided Medicaid Managed Care team contact information and advised to reach out if in need of care coordination or care management services.   Engaged with patient  for by telephone forinitial visit in response to referral for case management and/or care coordination services.   Assessments/Interventions:  Review of past medical history, allergies, medications, health status, including review of consultants reports, laboratory and other test data, was performed as part of comprehensive evaluation and provision of chronic care management services.  SDOH: (Social Determinant of Health) assessments and interventions performed: SDOH Interventions    Flowsheet Row Patient Outreach Telephone from 01/03/2023 in [facility] Office Visit from 12/28/2022 in [facility] Office Visit from 09/22/2022 in [facility] Office Visit from 02/16/2021 in [facility] Office Visit from 10/21/2020 in [facility]  SDOH Interventions       Depression Interventions/Treatment  -- Medication Medication Currently on Treatment Currently on Treatment  Stress Interventions Offered [employer] Resources, Provide Counseling  [Patient is unsure if he wishes to pursue psychiatry and counseling at this moment at he wishes to gain these services in ADDITIONAL to his current treatment at  [facility] and wishes to contact his insurance provider and PCP first.] -- -- -- --       Advanced Directives Status:  Not addressed in this encounter.  Care Plan                 Allergies  Allergen Reactions   Other Hives and Shortness Of Breath    Dogs and Cats.   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   Back pain without sciatica 02/16/2021   Neck pain 02/16/2021   Generalized joint pain 10/21/2020   Generalized abdominal pain 10/21/2020   Normocytic anemia 02/18/2019   Prolonged QT interval 02/18/2019   Globus sensation 07/04/2018   Schizophrenia (HCC) 03/28/2018   MDD (major depressive disorder), recurrent episode, severe (HCC) 03/28/2018   Tobacco use disorder 12/17/2016   Schizoaffective disorder, bipolar type (HCC) 08/23/2016   Alcohol use disorder, severe, dependence (HCC) 08/23/2016   Allergic rhinitis due to pollen 10/09/2015   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   Mild persistent allergic asthma 10/09/2015   [facility] LCSW recevied new referral for behavioral health resource connection. Patient reports that he was unaware that my services or any additional services that I may refer him out to, would have to go through his insurance provider. Patient was provided extensive education on the enrollment process for behavioral health treatment resources within his local area. Patient is unsure if he wishes to pursue psychiatry and counseling at this moment as he wishes to gain these services in ADDITIONAL to his current treatment plan with [facility] and wishes to contact his insurance provider  and PCP first to ensure that he would not receive an additional bill for duplication of services. Patient is agreeable to case closure at this time but [facility] LCSW is happy to get back involved at any time in the case he wishes to  gain  additional behavioral health services.  Patient was very appreciative of education and support provided to him today. [facility] LCSW will sign off at this time.   Follow up:  Patient requests no follow-up at this time.  Plan: The Managed Medicaid care management team is available to follow up with the patient after provider conversation with the patient regarding recommendation for care management engagement and subsequent re-referral to the care management team.   [name], BSW, MSW, LCSW Managed Medicaid LCSW [facility]  [facility] [email or web address] Phone: [phone]

## 2023-01-03 NOTE — Patient Instructions (Signed)
Visit Information  Leon Mason was given information about Medicaid Managed Care team care coordination services and wishes to consider to engagement with the [facility] Managed Care team.   The  Patient has been provided with contact information for the Managed Medicaid care management team and has been advised to call with any health related questions or concerns.   [name], BSW, MSW, [name] Managed Medicaid LCSW [facility]  [facility] [email or web address] Phone: [phone]

## 2023-01-05 ENCOUNTER — Telehealth: Payer: Self-pay | Admitting: Rheumatology

## 2023-01-05 DIAGNOSIS — Z79899 Other long term (current) drug therapy: Secondary | ICD-10-CM

## 2023-01-05 DIAGNOSIS — H209 Unspecified iridocyclitis: Secondary | ICD-10-CM

## 2023-01-05 DIAGNOSIS — M47819 Spondylosis without myelopathy or radiculopathy, site unspecified: Secondary | ICD-10-CM

## 2023-01-05 NOTE — Telephone Encounter (Signed)
Patient left a voicemail requesting to speak with the pharmacist regarding reapplying for patient assistance for next year for his Humira medication.

## 2023-01-07 NOTE — Telephone Encounter (Signed)
Patient returned your call and states his Medicaid coverage will end on 02/05/2023. Patient is requesting a return call. Thanks!

## 2023-01-07 NOTE — Telephone Encounter (Signed)
ATC patient to discuss. Unable to reach. Patient appears to have Medicaid plan through [facility]  Submitted a Prior Authorization request to [facility] for HUMIRA via CoverMyMeds. Will update once we receive a response.  Key: [id number]

## 2023-01-10 ENCOUNTER — Other Ambulatory Visit (HOSPITAL_COMMUNITY): Payer: Self-pay

## 2023-01-10 ENCOUNTER — Telehealth: Payer: Self-pay | Admitting: Pharmacist

## 2023-01-10 MED ORDER — HUMIRA (2 PEN) 40 MG/0.4ML ~~LOC~~ AJKT
AUTO-INJECTOR | SUBCUTANEOUS | 0 refills | Status: DC
Start: 2023-01-10 — End: 2023-03-21
  Filled 2023-01-12: qty 2, 28d supply, fill #0
  Filled 2023-02-01: qty 2, 28d supply, fill #1

## 2023-01-10 NOTE — Telephone Encounter (Signed)
Patient losing Medicaid after 02/06/2023. [employer] patient assistance application mailed to patient's home today. Provider portion will be completed after patient portion is returned  Chesley Mires, PharmD, MPH, BCPS, CPP Clinical Pharmacist (Rheumatology and Pulmonology)

## 2023-01-10 NOTE — Telephone Encounter (Signed)
Received notification from [facility] regarding a prior authorization for HUMIRA. Authorization has been APPROVED from 01/08/23 to 01/08/2024. Approval letter sent to scan center.  Per test claim, copay for 28 days supply is $4  Patient can fill through [facility]: [phone]   Authorization # [id number] Phone # [phone]  Rx sent to [facility]. Routed to [facility] for onboarding needs and to set up first shipment to home.  Chesley Mires, PharmD, MPH, BCPS, CPP Clinical Pharmacist (Rheumatology and Pulmonology)

## 2023-01-12 ENCOUNTER — Other Ambulatory Visit: Payer: Self-pay

## 2023-01-12 ENCOUNTER — Other Ambulatory Visit (HOSPITAL_COMMUNITY): Payer: Self-pay

## 2023-01-12 NOTE — Progress Notes (Signed)
Specialty Pharmacy Initial Fill Coordination Note  Leon Mason is a 32 y.o. male contacted today regarding refills of specialty medication(s) Adalimumab   Patient requested Delivery   Delivery date: 01/14/23   Verified address: [address]   [place] [id number]   Medication will be filled on 01/13/23.   Patient is aware of $4.00 copayment. Payment information has been forwarded to [place]on at [facility]. Pt requests a copy of the receipt be mailed with the medication.

## 2023-01-12 NOTE — Telephone Encounter (Signed)
Leon Mason spoke with pt - he will not have any interruption in Medicaid coverage. Patient will not need patient assistance through [facility] unless he becomes uninsured again.  Chesley Mires, PharmD, MPH, BCPS, CPP Clinical Pharmacist (Rheumatology and Pulmonology)

## 2023-01-13 ENCOUNTER — Other Ambulatory Visit: Payer: Self-pay

## 2023-01-13 ENCOUNTER — Other Ambulatory Visit: Payer: Self-pay | Admitting: Pharmacist

## 2023-01-13 NOTE — Progress Notes (Signed)
Patient started Humira in office on 12/26/2020. Has been stable on treatment since starting for AS and uveitis  Chesley Mires, PharmD, MPH, BCPS, CPP Clinical Pharmacist (Rheumatology and Pulmonology)

## 2023-01-18 ENCOUNTER — Ambulatory Visit: Payer: MEDICAID | Admitting: Psychiatry

## 2023-01-18 DIAGNOSIS — F411 Generalized anxiety disorder: Secondary | ICD-10-CM

## 2023-01-18 DIAGNOSIS — Z636 Dependent relative needing care at home: Secondary | ICD-10-CM

## 2023-01-18 DIAGNOSIS — Z638 Other specified problems related to primary support group: Secondary | ICD-10-CM

## 2023-01-18 DIAGNOSIS — F1020 Alcohol dependence, uncomplicated: Secondary | ICD-10-CM

## 2023-01-18 DIAGNOSIS — F250 Schizoaffective disorder, bipolar type: Secondary | ICD-10-CM | POA: Diagnosis not present
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
## 2023-01-25 ENCOUNTER — Telehealth: Payer: Self-pay | Admitting: Psychiatry

## 2023-01-25 NOTE — Telephone Encounter (Signed)
Admin note for non-service contact  Patient ID: SUMPTER KICE  MRN: [id number] DATE: 01/25/2023  Message received from staff SW Pt called in distress this morning over GF breaking up with him.  Asking for urgent appt today, which is fully booked.  Staff returned message, offered known opening Friday 11a, apparently accepted.  Will assess then, open to a call sooner if urgent decision-making is needed.  Robley Fries, PhD [name], PhD LP Clinical Psychologist, [facility] [address] [place], [place] [address] [phone]

## 2023-01-27 ENCOUNTER — Other Ambulatory Visit: Payer: Self-pay

## 2023-01-28 ENCOUNTER — Ambulatory Visit (INDEPENDENT_AMBULATORY_CARE_PROVIDER_SITE_OTHER): Payer: MEDICAID | Admitting: Psychiatry

## 2023-01-28 ENCOUNTER — Ambulatory Visit: Payer: MEDICAID | Admitting: Psychiatry

## 2023-01-28 DIAGNOSIS — F411 Generalized anxiety disorder: Secondary | ICD-10-CM

## 2023-01-28 DIAGNOSIS — Z630 Problems in relationship with spouse or partner: Secondary | ICD-10-CM

## 2023-01-28 DIAGNOSIS — Z638 Other specified problems related to primary support group: Secondary | ICD-10-CM | POA: Diagnosis not present

## 2023-01-28 DIAGNOSIS — F250 Schizoaffective disorder, bipolar type: Secondary | ICD-10-CM

## 2023-01-28 DIAGNOSIS — F1020 Alcohol dependence, uncomplicated: Secondary | ICD-10-CM

## 2023-01-28 DIAGNOSIS — Z636 Dependent relative needing care at home: Secondary | ICD-10-CM

## 2023-01-28 NOTE — Progress Notes (Signed)
Psychotherapy Progress Note [facility] [name], PhD LP  Patient ID: Leon Mason [facility] "Leon Mason    MRN: [id number] Therapy format: Individual psychotherapy Date: 01/28/2023      Start: 1:09p     Stop: 1:58p     Time Spent: 49 min Location: In-person   Session narrative (presenting needs, interim history, self-report of stressors and symptoms, applications of prior therapy, status changes, and interventions made in session) GF broke up with him after an attempt to be intimate, which was completely consensual to start but called off when she became anxious.  Seemed to go OK just calling off the intimacy, but then she broke up with him later, said she wasn't ready, then blocked him quickly, precipitating a crying jag, heavy drinking, and calling off work Monday and Tuesday.  Half day Wed, back to regular days yesterday and today.  Supportive coworkers, helpful.  Addressed multiple feelings and tendency to catastrophize, validating his disappointment and giving proper credit for staying honest, open, and accepting with her.  Interpreted that she must have been attracted to him to agree initially but perhaps had not assessed her own trauma background well enough to give reliable consent, then in frightening herself took the more drastic out of blocking him.  Affirmed the need to suffer through it somehow, his rise from drinking and absenteeism, and his maintaining touch with supportive people.    At this point, has progressed in outfitting his apartment, still on track to move out, likely early January.  Has utilities on, getting appliances and furniture in place, barely enough right now to sleep there.  For now, still living at [facility] house, with plans to move his computer and other items soon.  Encouraged that if it is indeed affordable, then decompressing the household and giving himself his own space could be a good step in self-support, independence, and establishing  stronger boundaries with the multiple dysfunctions there.  Re dating, says he has already been in touch with 2 other girls, and he has a friends with benefits agreement with one of them, which promises to help with his high libido and sexual frustration.  Encouraged to maintain openness and honesty here, too.    Continued to encourage sober activities and soothing activities which do not resort to alcohol or threaten to complicate his liver disease.  Therapeutic modalities: Cognitive Behavioral Therapy, Solution-Oriented/Positive Psychology, and Ego-Supportive  Mental Status/Observations:  Appearance:   Casual     Behavior:  Appropriate  Motor:  Normal  Speech/Language:   Clear and Coherent  Affect:  Appropriate, responsive  Mood:  dysthymic  Thought process:  normal  Thought content:    WNL  Sensory/Perceptual disturbances:    WNL  Orientation:  Fully oriented  Attention:  Good    Concentration:  Fair  Memory:  WNL  Insight:    Good  Judgment:   Fair  Impulse Control:  Good   Risk Assessment: Danger to Self: No Self-injurious Behavior: No Danger to Others: No Physical Aggression / Violence: No Duty to Warn: No Access to Firearms a concern: No  Assessment of progress:  progressing  Diagnosis:   ICD-10-CM   1. Schizoaffective disorder, bipolar type (HCC)  F25.0     2. Generalized anxiety disorder  F41.1     3. Caregiver stress  Z63.6     4. Relationship problems with multiple family members  Z63.8     5. Alcohol use disorder, moderate, dependence (HCC)  F10.20  6. Relationship problem between partners  Z63.0      Plan:  Sobriety -- Seek abstinence for health and life.  Prioritize arranging escape time from the house to enjoy driving, games while parked, or meet ups with others and make sure to notice how it is enough relief without drinking.  Check [email or web address] for possible interests he might not think of. Work -- Programme researcher, broadcasting/film/video seasonal job as doing, pursue permanent  status as health allows.  Previous assessment that he qualifies as disabled, but no opinion at present, and in favor of working if able. Health care -- By all means treat pre-cirrhosis appropriately Household stability and emergency welfare -- Likely benefit of ACTT team -- may ask DSS or the VA (in [name]).  Contact [place] Adult Protective Services if anything rises to the level of neglect, abuse, or inability to manage health among the three in the household.  Mobile crisis service or law enforcement as needed if domestic dispute intensifies.  Address coverable DME needs for [name] to her VA case worker, if identifiable.  If charitable sources for furniture, affordable cleaning, or other needs appropriate to elder, disabled, veteran are needed, check with DSS or VA as appropriate.  Continuing recommendations to household for mutual sobriety, engage 12-step support or affordable counseling.  In the long run, Pt best served by exiting the household and establishing himself in his own space. Dating/relationship -- Open, honest communication about sex and interests, categorically respect her limits Other recommendations/advice -- As may be noted above.  Continue to utilize previously learned skills ad lib. Medication compliance -- Maintain medication as prescribed and work faithfully with relevant prescriber(s) if any changes are desired or seem indicated. Crisis service -- Aware of call list and work-in appts.  Call the clinic on-call service, 988/hotline, 911, or present to [facility] or ER if any life-threatening psychiatric crisis. Followup -- Return for time as already scheduled, avail earlier @ PT's need.  Next scheduled visit with me 02/17/2023.  Next scheduled in this office 02/01/2023.  Robley Fries, PhD [name], PhD LP Clinical Psychologist, [facility] [address], [address] [place], [place] [address] [phone]

## 2023-02-01 ENCOUNTER — Ambulatory Visit: Payer: Self-pay | Admitting: Adult Health

## 2023-02-01 ENCOUNTER — Other Ambulatory Visit (HOSPITAL_COMMUNITY): Payer: Self-pay

## 2023-02-01 ENCOUNTER — Other Ambulatory Visit (HOSPITAL_COMMUNITY): Payer: Self-pay | Admitting: Pharmacy Technician

## 2023-02-01 NOTE — Progress Notes (Signed)
Specialty Pharmacy Refill Coordination Note  Leon Mason is a 32 y.o. male contacted today regarding refills of specialty medication(s) Adalimumab   Patient requested Delivery   Delivery date: 02/10/23   Verified address: [address]  [place]   Medication will be filled on 02/09/23.

## 2023-02-08 ENCOUNTER — Encounter: Payer: Self-pay | Admitting: Adult Health

## 2023-02-08 ENCOUNTER — Ambulatory Visit (INDEPENDENT_AMBULATORY_CARE_PROVIDER_SITE_OTHER): Payer: MEDICAID | Admitting: Adult Health

## 2023-02-08 DIAGNOSIS — F250 Schizoaffective disorder, bipolar type: Secondary | ICD-10-CM

## 2023-02-08 MED ORDER — PERPHENAZINE 4 MG PO TABS
4.0000 mg | ORAL_TABLET | Freq: Two times a day (BID) | ORAL | 1 refills | Status: DC
Start: 2023-02-08 — End: 2023-05-13

## 2023-02-08 NOTE — Progress Notes (Signed)
Leon Mason [id number] [birth year] 32 y.o.  Subjective:   Patient ID:  Leon Mason is a 32 y.o. (DOB [birth year]) male.  Chief Complaint: No chief complaint on file.   HPI Leon Mason presents to the office today for follow-up of MDD, GAD, insomnia, Schizophrenia.  Describes mood today as "ok". Pleasant. Mood symptoms - reports some depression, anxiety, and irritability. Denies panic attacks. Reports some worry rumination and over thinking. Reports recent break-up.  Mood is consistent. Stating "I feel like I'm struggling with some things". Feels like medications are helpful. Decreased interest and motivation. Taking medications as prescribed. Energy levels stable. Active, does not have a regular exercise routine. Walking 8,000 steps a day at work. Enjoys some usual interests and activities. Single. Lives with mother (disabled) - brother 3 cats and a dog. Has rented a house and plans to move into it once he gets accommodations. Spending time with friends.  Appetite adequate. Weight stable 315 pounds. Sleeps better some nights than others. Averages 6 hours - staying up late at night. Focus and concentration stable. Completing tasks. Managing aspects of household. 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 Taking classes. Denies SI or HI.  Denies AH or VH. Reports paranoid thoughts. Denies self harm. Denies substance use. Reports alcohol use.    Seeing [name] for therapy.  Previous medications: Perphenazine, Lithium, Sertraline, Haldol, Zyprexa, Risperdal, stopped due to various side effects, ie biting tongue a lot, restless legs, weight gain   AUDIT    Flowsheet Row Appointment from 12/27/2022 in [facility] Appointment from 11/23/2022 in [facility]  Alcohol Use Disorder Identification Test Final Score (AUDIT) 21  20       GAD-7    Flowsheet Row Office Visit from  12/28/2022 in [facility] Office Visit from 09/22/2022 in [facility] Office Visit from 06/15/2021 in [facility] Office Visit from 05/29/2021 in [facility] Office Visit from 02/16/2021 in [facility]  Total GAD-7 Score 18 13 8 8 16       PHQ2-9    Flowsheet Row Office Visit from 12/28/2022 in [facility] Office Visit from 09/22/2022 in [facility] Office Visit from 06/15/2021 in [facility] Office Visit from 05/29/2021 in [facility] Office Visit from 02/16/2021 in [facility]  PHQ-2 Total Score 6 3 3 2 3   PHQ-9 Total Score 20 9 6 7 7       Flowsheet Row ED from 08/25/2022 in [facility] ED from 08/25/2020 in [facility] ED from 08/02/2020 in [facility] at [facility] RISK CATEGORY No Risk No Risk No Risk        Review of Systems:  Review of Systems  Musculoskeletal:  Negative for gait problem.  Neurological:  Negative for tremors.  Psychiatric/Behavioral:         Please refer to HPI    Medications: I have reviewed the patient's current medications.  Current Outpatient Medications  Medication Sig Dispense Refill   adalimumab (HUMIRA, 2 PEN,) 40 MG/0.4ML pen INJECT 1 PEN SUBCUTANEOUSLY ONCE EVERY 14 DAYS 4 each 0   albuterol (PROAIR HFA) 108 (90 Base) MCG/ACT inhaler Inhale 2 puffs  into the lungs every 6 (six) hours as needed for shortness of breath. 6.7 g 0   diclofenac Sodium (VOLTAREN) 1 % GEL Apply 2 g topically 4 (four) times daily. 400 g 2   diphenhydrAMINE-Zinc Acetate (BENADRYL EX) Apply topically as needed.     fluticasone-salmeterol (ADVAIR) 250-50  MCG/ACT AEPB Inhale 1 puff into the lungs daily.     folic acid (FOLVITE) 1 MG tablet Take 1 tablet (1 mg total) by mouth daily. 90 tablet 1   IBUPROFEN PO Take by mouth as needed.     Multiple Vitamin (MULTIVITAMIN PO) Take by mouth daily.     perphenazine (TRILAFON) 4 MG tablet Take 4 mg by mouth 2 (two) times daily.     predniSONE (DELTASONE) 5 MG tablet Take 4 tabs po qd x 4 days, 3  tabs po qd x 4 days, 2  tabs po qd x 4 days, 1  tab po qd x 4 days 40 tablet 0   sertraline (ZOLOFT) 50 MG tablet Take 1 tablet (50 mg total) by mouth daily. 90 tablet 3   thiamine (VITAMIN B1) 100 MG tablet Take 1 tablet (100 mg total) by mouth daily. 90 tablet 1   triamcinolone cream (KENALOG) 0.1 % Apply 1 Application topically 2 (two) times daily as needed (poison oak/ivy rash (max 10 days per use)). 80 g 0   UNABLE TO FIND Med Name: Bug spray topically, daily use.     No current facility-administered medications for this visit.    Medication Side Effects: None  Allergies:  Allergies  Allergen Reactions   Other Hives and Shortness Of Breath    Dogs and Cats.   Dust Mite Extract    Pollen Extract     Past Medical History:  Diagnosis Date   Anxiety    Asthma    Bipolar 1 disorder (HCC)    Hallucinations    Mild intermittent asthma with (acute) exacerbation 02/18/2019   Psychosis (HCC) 06/14/2013   Schizo-affective schizophrenia, chronic condition with acute exacerbation (HCC) 03/28/2018   Schizoaffective disorder (HCC)    Schizophrenia (HCC)    SIRS (systemic inflammatory response syndrome) (HCC) 02/18/2019    Past Medical History, Surgical history, Social history, and Family history were reviewed and updated as appropriate.   Please see review of systems for further details on the patient's review from today.   Objective:   Physical Exam:  There were no vitals taken for this visit.  Physical Exam Constitutional:      General: He is not in acute distress. Musculoskeletal:         General: No deformity.  Neurological:     Mental Status: He is alert and oriented to person, place, and time.     Coordination: Coordination normal.  Psychiatric:        Attention and Perception: Attention and perception normal. He does not perceive auditory or visual hallucinations.        Mood and Affect: Mood normal. Mood is not anxious or depressed. Affect is not labile, blunt, angry or inappropriate.        Speech: Speech normal.        Behavior: Behavior normal.        Thought Content: Thought content normal. Thought content is not paranoid or delusional. Thought content does not include homicidal or suicidal ideation. Thought content does not include homicidal or suicidal plan.        Cognition and Memory: Cognition and memory normal.  Judgment: Judgment normal.     Comments: Insight intact     Lab Review:     Component Value Date/Time   NA 137 12/15/2022 1510   NA 138 09/22/2022 1520   K 3.7 12/15/2022 1510   CL 101 12/15/2022 1510   CO2 26 12/15/2022 1510   GLUCOSE 77 12/15/2022 1510   BUN 17 12/15/2022 1510   BUN 15 09/22/2022 1520   CREATININE 1.00 12/15/2022 1510   CALCIUM 9.8 12/15/2022 1510   PROT 7.8 12/15/2022 1510   PROT 7.6 09/22/2022 1520   ALBUMIN 4.6 09/22/2022 1520   AST 41 (H) 12/15/2022 1510   ALT 64 (H) 12/15/2022 1510   ALKPHOS 69 09/22/2022 1520   BILITOT 0.5 12/15/2022 1510   BILITOT 0.4 09/22/2022 1520   GFRNONAA >60 08/25/2022 1243   GFRAA >60 02/19/2019 0602       Component Value Date/Time   WBC 7.3 12/15/2022 1510   RBC 5.01 12/15/2022 1510   HGB 15.5 12/15/2022 1510   HGB 14.8 09/22/2022 1520   HCT 45.4 12/15/2022 1510   HCT 43.5 09/22/2022 1520   PLT 206 12/15/2022 1510   PLT 181 09/22/2022 1520   MCV 90.6 12/15/2022 1510   MCV 94 09/22/2022 1520   MCH 30.9 12/15/2022 1510   MCHC 34.1 12/15/2022 1510   RDW 13.3 12/15/2022 1510   RDW 14.3 09/22/2022 1520   LYMPHSABS 2,373 12/15/2022 1510   MONOABS 0.7 08/25/2022 1243    EOSABS 314 12/15/2022 1510   BASOSABS 73 12/15/2022 1510    Lithium Lvl  Date Value Ref Range Status  03/30/2018 <0.06 (L) 0.60 - 1.20 mmol/L Final    Comment:    REPEATED TO VERIFY Performed at [facility], [address] [place], [place] [address]      No results found for: "PHENYTOIN", "PHENOBARB", "VALPROATE", "CBMZ"   .res Assessment: Plan:    Plan:  PDMP reviewed  Discussed alcohol issues and plans to work on alcohol cessation to help improve mood.   Continue: Zoloft 50mg   Perphenazine - 4mg  - 2 at hs.  Restart therapy - Andy Mitchum.  EKG - prolonged QT interval - last EKG 11/01/2022- planning  RTC 3 months  Patient advised to contact office with any questions, adverse effects, or acute worsening in signs and symptoms.  Discussed potential metabolic side effects associated with atypical antipsychotics, as well as potential risk for movement side effects. Advised pt to contact office if movement side effects occur.     There are no diagnoses linked to this encounter.   Please see After Visit Summary for patient specific instructions.  Future Appointments  Date Time Provider Department Center  02/17/2023  4:00 PM Robley Fries, PhD [facility] None  03/17/2023  4:00 PM Robley Fries, PhD [facility] None  03/30/2023  4:00 PM Raliegh Ip, DO [facility] None  04/04/2023 12:50 PM Gearldine Bienenstock, PA-C [facility] None  04/14/2023  4:00 PM Robley Fries, PhD [facility] None  05/10/2023 10:00 AM Robley Fries, PhD [facility] None    No orders of the defined types were placed in this encounter.   -------------------------------

## 2023-02-09 ENCOUNTER — Other Ambulatory Visit: Payer: Self-pay

## 2023-02-10 ENCOUNTER — Other Ambulatory Visit: Payer: Self-pay

## 2023-02-14 ENCOUNTER — Other Ambulatory Visit: Payer: Self-pay

## 2023-02-15 ENCOUNTER — Other Ambulatory Visit: Payer: Self-pay

## 2023-02-15 ENCOUNTER — Telehealth: Payer: Self-pay

## 2023-02-15 ENCOUNTER — Other Ambulatory Visit: Payer: Self-pay | Admitting: Family Medicine

## 2023-02-15 DIAGNOSIS — F332 Major depressive disorder, recurrent severe without psychotic features: Secondary | ICD-10-CM

## 2023-02-15 DIAGNOSIS — F250 Schizoaffective disorder, bipolar type: Secondary | ICD-10-CM

## 2023-02-15 DIAGNOSIS — F439 Reaction to severe stress, unspecified: Secondary | ICD-10-CM

## 2023-02-15 NOTE — Telephone Encounter (Signed)
Pt returned call, informed me that Medicaid decided he "makes too much money" and is no longer eligible. He has already lined up a new insurance plan that will become active on 03/09/23. I recommended that once he gets his plan info he should take a picture and upload them via MyChart so that we can go ahead and begin the BIV for Humira through his new insurance plan as soon as possible.  Pt states that his next injection is due on Thursday 12/12, therefore he will need two sample pens to bridge him to the new year. Advised that I would notify office staff to prepare a sample box for him and that he can come pick it up from the [facility] clinic today. Pt verbalized understanding to all.

## 2023-02-15 NOTE — Telephone Encounter (Signed)
Notified by CF that pt states he has lost his insurance until 03/09/23. Attempted to contact pt to discuss, left VoiceMail requesting a return call. Direct office number provided.

## 2023-02-15 NOTE — Telephone Encounter (Signed)
Samples reserved in fridge for patient.

## 2023-02-15 NOTE — Progress Notes (Signed)
Done  Orders Placed This Encounter  Procedures   Ambulatory referral to Integrated Behavioral Health    Referral Priority:   Routine    Referral Type:   Consultation    Referral Reason:   Specialty Services Required    Number of Visits Requested:   1

## 2023-02-15 NOTE — Progress Notes (Signed)
When attempting to fill on 12/4 insurance rejected that patient is not covered. LVM for patient to obtain new coverage that day. Patient called back today to state that he will not have coverage until 03/09/23. Mills Koller will assist in trying to provide samples until new coverage effective.

## 2023-02-17 ENCOUNTER — Ambulatory Visit: Payer: MEDICAID | Admitting: Psychiatry

## 2023-02-17 NOTE — Telephone Encounter (Signed)
Reached out to patient to see if he was still planning on coming to the office to pick up samples. Patient states he plans on coming by today.

## 2023-02-18 NOTE — Telephone Encounter (Signed)
Medication Samples have been provided to the patient.  Drug name: Humira       Strength: 40 mg/0.4 mL        Qty: 2  LOT: [id number]  Exp.Date: 12/2023  Dosing instructions: Inject 40 mg into skin every 14 days.

## 2023-03-08 ENCOUNTER — Other Ambulatory Visit (HOSPITAL_COMMUNITY): Payer: Self-pay

## 2023-03-10 ENCOUNTER — Other Ambulatory Visit (HOSPITAL_COMMUNITY): Payer: Self-pay

## 2023-03-11 ENCOUNTER — Other Ambulatory Visit (HOSPITAL_COMMUNITY): Payer: Self-pay

## 2023-03-17 ENCOUNTER — Ambulatory Visit: Payer: MEDICAID | Admitting: Psychiatry

## 2023-03-17 ENCOUNTER — Ambulatory Visit: Payer: MEDICAID | Admitting: Physician Assistant

## 2023-03-17 DIAGNOSIS — Z91199 Patient's noncompliance with other medical treatment and regimen due to unspecified reason: Secondary | ICD-10-CM

## 2023-03-18 ENCOUNTER — Ambulatory Visit: Payer: 59 | Admitting: Family Medicine

## 2023-03-18 ENCOUNTER — Encounter: Payer: Self-pay | Admitting: Family Medicine

## 2023-03-18 VITALS — BP 117/77 | HR 87 | Ht 75.0 in | Wt 315.0 lb

## 2023-03-18 DIAGNOSIS — J0110 Acute frontal sinusitis, unspecified: Secondary | ICD-10-CM

## 2023-03-18 DIAGNOSIS — J069 Acute upper respiratory infection, unspecified: Secondary | ICD-10-CM | POA: Diagnosis not present

## 2023-03-18 LAB — CULTURE, GROUP A STREP

## 2023-03-18 LAB — RAPID STREP SCREEN (MED CTR MEBANE ONLY): Strep Gp A Ag, IA W/Reflex: NEGATIVE

## 2023-03-18 LAB — RSV AG, IMMUNOCHR, WAIVED: RSV Ag, Immunochr, Waived: NEGATIVE

## 2023-03-18 LAB — VERITOR FLU A/B WAIVED
Influenza A: NEGATIVE
Influenza B: NEGATIVE

## 2023-03-18 MED ORDER — AMOXICILLIN 500 MG PO CAPS: 500.0000 mg | ORAL_CAPSULE | Freq: Two times a day (BID) | ORAL | 0 refills | Status: DC

## 2023-03-18 NOTE — Progress Notes (Signed)
 Admin note for non-service contact  Patient ID: Leon Mason  MRN: [id number] DATE: 03/17/2023  Noshow for 4pm visit.  Waive fee this time, in respect of known chaotic conditions to contend with.    Still on schedule for February, encouraged to keep appt reliably, be in touch if unable, and organize sufficiently to do so.   [name], PhD [name], PhD LP Clinical Psychologist, [facility] [address] [place], [place] [address] [phone]

## 2023-03-18 NOTE — Progress Notes (Signed)
 BP 117/77   Pulse 87   Ht 6' 3 (1.905 m)   Wt (!) 315 lb (142.9 kg)   SpO2 95%   BMI 39.37 kg/m    Subjective:   Patient ID: Leon Mason, male    DOB: 06-23-1990, 33 y.o.   MRN: [id number]  HPI: Leon Mason is a 33 y.o. male presenting on 03/18/2023 for URI   HPI Patient is coming in today for cough and congestion and achiness runny nose and sinus congestion.  He says this is all been going on for the past couple days but it really worsened today and he is really feeling ill.  He does state that his family had COVID over the holiday break a few weeks ago but nobody else has been sick around him recently.  He has been using some over-the-counter cough medicine with liquid Mucinex  and it has not really seem to be making much of a difference.  He says his cough has been productive and then his biggest thing is he just feels achy and weak and not feeling well.  Relevant past medical, surgical, family and social history reviewed and updated as indicated. Interim medical history since our last visit reviewed. Allergies and medications reviewed and updated.  Review of Systems  Constitutional:  Negative for chills and fever.  HENT:  Positive for congestion, postnasal drip, rhinorrhea and sinus pressure. Negative for ear discharge, ear pain, sneezing, sore throat and voice change.   Eyes:  Negative for pain, discharge, redness and visual disturbance.  Respiratory:  Negative for shortness of breath and wheezing.   Cardiovascular:  Negative for chest pain and leg swelling.  Musculoskeletal:  Positive for myalgias. Negative for gait problem.  Skin:  Negative for rash.  Neurological:  Negative for dizziness, weakness and light-headedness.  All other systems reviewed and are negative.   Per HPI unless specifically indicated above   Allergies as of 03/18/2023       Reactions   Other Hives, Shortness Of Breath   Dogs and Cats.   Dust Mite Extract    Pollen Extract          Medication List        Accurate as of March 18, 2023  1:38 PM. If you have any questions, ask your nurse or doctor.          albuterol  108 (90 Base) MCG/ACT inhaler Commonly known as: ProAir  HFA Inhale 2 puffs into the lungs every 6 (six) hours as needed for shortness of breath.   amoxicillin  500 MG capsule Commonly known as: AMOXIL  Take 1 capsule (500 mg total) by mouth 2 (two) times daily.   BENADRYL  EX Apply topically as needed.   diclofenac  Sodium 1 % Gel Commonly known as: VOLTAREN  Apply 2 g topically 4 (four) times daily.   fluticasone -salmeterol 250-50 MCG/ACT Aepb Commonly known as: ADVAIR  Inhale 1 puff into the lungs daily.   folic acid  1 MG tablet Commonly known as: FOLVITE  Take 1 tablet (1 mg total) by mouth daily.   Humira  (2 Pen) 40 MG/0.4ML pen Generic drug: adalimumab  INJECT 1 PEN SUBCUTANEOUSLY ONCE EVERY 14 DAYS   IBUPROFEN  PO Take by mouth as needed.   MULTIVITAMIN PO Take by mouth daily.   perphenazine  4 MG tablet Commonly known as: TRILAFON  Take 1 tablet (4 mg total) by mouth 2 (two) times daily.   predniSONE  5 MG tablet Commonly known as: DELTASONE  Take 4 tabs po qd x 4 days, 3  tabs po  qd x 4 days, 2  tabs po qd x 4 days, 1  tab po qd x 4 days   sertraline  50 MG tablet Commonly known as: ZOLOFT  Take 1 tablet (50 mg total) by mouth daily.   thiamine  100 MG tablet Commonly known as: VITAMIN B1 Take 1 tablet (100 mg total) by mouth daily.   triamcinolone  cream 0.1 % Commonly known as: KENALOG  Apply 1 Application topically 2 (two) times daily as needed (poison oak/ivy rash (max 10 days per use)).   UNABLE TO FIND Med Name: Bug spray topically, daily use.         Objective:   BP 117/77   Pulse 87   Ht 6' 3 (1.905 m)   Wt (!) 315 lb (142.9 kg)   SpO2 95%   BMI 39.37 kg/m   Wt Readings from Last 3 Encounters:  03/18/23 (!) 315 lb (142.9 kg)  12/28/22 (!) 316 lb (143.3 kg)  12/15/22 (!) 317 lb (143.8 kg)     Physical Exam Vitals and nursing note reviewed.  Constitutional:      General: He is not in acute distress.    Appearance: He is well-developed. He is not diaphoretic.  HENT:     Right Ear: Tympanic membrane, ear canal and external ear normal.     Left Ear: Tympanic membrane, ear canal and external ear normal.     Nose: Mucosal edema and rhinorrhea present.     Right Sinus: Maxillary sinus tenderness present. No frontal sinus tenderness.     Left Sinus: Maxillary sinus tenderness present. No frontal sinus tenderness.     Mouth/Throat:     Mouth: Mucous membranes are moist.     Pharynx: Uvula midline. No oropharyngeal exudate or posterior oropharyngeal erythema.     Tonsils: No tonsillar abscesses.  Eyes:     General: No scleral icterus.    Conjunctiva/sclera: Conjunctivae normal.  Neck:     Thyroid: No thyromegaly.  Cardiovascular:     Rate and Rhythm: Normal rate and regular rhythm.     Heart sounds: Normal heart sounds. No murmur heard. Pulmonary:     Effort: Pulmonary effort is normal. No respiratory distress.     Breath sounds: Normal breath sounds. No wheezing, rhonchi or rales.  Musculoskeletal:        General: Normal range of motion.     Cervical back: Neck supple.  Lymphadenopathy:     Cervical: No cervical adenopathy.  Skin:    General: Skin is warm and dry.     Findings: No rash.  Neurological:     Mental Status: He is alert and oriented to person, place, and time.     Coordination: Coordination normal.  Psychiatric:        Behavior: Behavior normal.       Assessment & Plan:   Problem List Items Addressed This Visit   None Visit Diagnoses       Acute non-recurrent frontal sinusitis    -  Primary   Relevant Medications   amoxicillin  (AMOXIL ) 500 MG capsule   Other Relevant Orders   Veritor Flu A/B Waived   Rapid Strep Screen ([facility] ONLY)   RSV Ag, Immunochr, Waived     Patient is on immunosuppressive so we will be a little more aggressive  and treat with amoxicillin .  His rapid flu strep and RSV were all negative.  Follow up plan: Return if symptoms worsen or fail to improve.  Counseling provided for all of the vaccine  components Orders Placed This Encounter  Procedures   Rapid Strep Screen ([facility] ONLY)   RSV Ag, Immunochr, Waived   Veritor Flu A/B Waived    Fonda Levins, MD [facility] 03/18/2023, 1:38 PM

## 2023-03-21 ENCOUNTER — Telehealth: Payer: Self-pay | Admitting: Rheumatology

## 2023-03-21 ENCOUNTER — Other Ambulatory Visit: Payer: Self-pay

## 2023-03-21 ENCOUNTER — Other Ambulatory Visit: Payer: Self-pay | Admitting: Rheumatology

## 2023-03-21 DIAGNOSIS — H209 Unspecified iridocyclitis: Secondary | ICD-10-CM

## 2023-03-21 DIAGNOSIS — Z79899 Other long term (current) drug therapy: Secondary | ICD-10-CM

## 2023-03-21 DIAGNOSIS — R6510 Systemic inflammatory response syndrome (SIRS) of non-infectious origin without acute organ dysfunction: Secondary | ICD-10-CM

## 2023-03-21 DIAGNOSIS — Z1589 Genetic susceptibility to other disease: Secondary | ICD-10-CM

## 2023-03-21 DIAGNOSIS — M47819 Spondylosis without myelopathy or radiculopathy, site unspecified: Secondary | ICD-10-CM

## 2023-03-21 MED ORDER — HUMIRA (2 PEN) 40 MG/0.4ML ~~LOC~~ AJKT: AUTO-INJECTOR | SUBCUTANEOUS | 0 refills | Status: DC

## 2023-03-21 NOTE — Telephone Encounter (Signed)
 Submitted an URGENT Prior Authorization request to [facility] for  HYRIMOZ   via CoverMyMeds. Will update once we receive a response.  Key: [id number]    Enrolled patient into Hyrimoz  copay card:   Sherry Pennant, PharmD, MPH, BCPS, CPP Clinical Pharmacist (Rheumatology and Pulmonology)

## 2023-03-21 NOTE — Telephone Encounter (Signed)
 Last Fill: 01/10/2023  Labs: 12/15/2022 No crystals present in synovial fluid.  Results consistent with inflammation.  CBC WNL. AST and ALT remain elevated-avoid tylenol  and alcohol use. Rest of CMP WNL. Patient is due for labs, I called patient.   TB Gold: 08/23/2022   Next Visit: 04/04/2023  Last Visit: 12/15/2022  IK:[id number]   Current Dose per office note 12/15/2022: Humira  40 mg sq injections every 14 days.   Okay to refill Humira ?

## 2023-03-21 NOTE — Progress Notes (Signed)
Office Visit Note  Patient: Leon Mason             Date of Birth: 07-12-1990           MRN: [id number]             PCP: Raliegh Ip, DO Referring: Raliegh Ip, DO Visit Date: 04/04/2023 Occupation: @GUAROCC @  Subjective:  Medication monitoring   History of Present Illness: Leon Mason is a 33 y.o. male with history ankylosing spondylitis.  Patient has been switched from Humira to Hyrimoz.  He is currently on Hyrimoz 40 mg sq injections every 14 days.  Patient reports that he had a gap in therapy for about 1 month due to change in insurance.  Patient denies any flares during the gap in therapy.  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  He denies any recurrence of left knee joint swelling since the job role change and having a cortisone injection performed on 12/15/2022. Patient reports that around Christmas he developed COVID-19 followed by another viral upper respiratory infection.  Patient states that these infections were joined the time that he had a gap in therapy.  He denies any other recent or recurrent infections.  He has been taking ibuprofen as needed for arthralgias and joint stiffness with colder weather temperatures.  Patient is planning to join [employer].    Activities of Daily Living:  Patient reports morning stiffness for 30 minutes.   Patient Denies nocturnal pain.  Difficulty dressing/grooming: Denies Difficulty climbing stairs: Denies Difficulty getting out of chair: Denies Difficulty using hands for taps, buttons, cutlery, and/or writing: Reports  Review of Systems  Constitutional:  Positive for fatigue.  HENT:  Negative for mouth sores, mouth dryness and nose dryness.   Eyes:  Negative for pain and dryness.  Respiratory:  Negative for shortness of breath and difficulty breathing.   Cardiovascular:  Positive for chest pain. Negative for  palpitations and swelling in legs/feet.  Gastrointestinal:  Positive for diarrhea. Negative for blood in stool and constipation.  Endocrine: Negative for increased urination.  Genitourinary:  Negative for involuntary urination.  Musculoskeletal:  Positive for joint pain, joint pain, myalgias, muscle weakness, morning stiffness and myalgias. Negative for gait problem, joint swelling and muscle tenderness.  Skin:  Positive for rash. Negative for color change, hair loss and sensitivity to sunlight.  Allergic/Immunologic: Positive for susceptible to infections.  Neurological:  Negative for dizziness and headaches.  Hematological:  Negative for swollen glands.  Psychiatric/Behavioral:  Positive for depressed mood. Negative for sleep disturbance. The patient is nervous/anxious.     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   Back pain without sciatica 02/16/2021   Neck pain 02/16/2021   Generalized joint pain 10/21/2020   Generalized abdominal pain 10/21/2020   Normocytic anemia 02/18/2019   Prolonged QT interval 02/18/2019   Globus sensation 07/04/2018   MDD (major depressive disorder), recurrent episode, severe (HCC) 03/28/2018   Tobacco use disorder 12/17/2016   Schizoaffective disorder, bipolar type (HCC) 08/23/2016   Alcohol use disorder, severe, dependence (HCC) 08/23/2016   Allergic rhinitis due to pollen 10/09/2015   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   Mild persistent allergic asthma 10/09/2015    Past Medical History:  Diagnosis Date   Anxiety    Asthma    Bipolar 1 disorder (HCC)    Hallucinations  Mild intermittent asthma with (acute) exacerbation 02/18/2019   Psychosis (HCC) 06/14/2013   Schizo-affective schizophrenia, chronic condition with acute exacerbation (HCC) 03/28/2018   Schizoaffective disorder (HCC)    Schizophrenia (HCC)    SIRS (systemic inflammatory response syndrome) (HCC) 02/18/2019    Family  History  Problem Relation Age of Onset   Bipolar disorder Mother    Alcoholism Mother    Diabetes Mother    Cervical cancer Mother    OCD Father    Bipolar disorder Father    Eczema Brother    Post-traumatic stress disorder Brother    COPD Maternal Aunt    Heart attack Maternal Grandmother    Breast cancer Maternal Grandmother    Heart attack Maternal Grandfather    Cancer Paternal Grandmother    Heart attack Paternal Grandfather    Asthma Neg Hx    Allergic rhinitis Neg Hx    Angioedema Neg Hx    Urticaria Neg Hx    Immunodeficiency Neg Hx    Atopy Neg Hx    Prostate cancer Neg Hx    Colon cancer Neg Hx    Past Surgical History:  Procedure Laterality Date   ingrown toenail Right 10/21/2022   great toe   WISDOM TOOTH EXTRACTION  2009   all four   Social History   Social History Narrative   Not on file   Immunization History  Administered Date(s) Administered   DTaP 01/03/1990, 03/17/1990, 06/08/1990, 02/20/1991, 06/01/1994   HIB (PRP-OMP) 01/03/1990, 03/17/1990, 06/08/1990, 02/20/1991   Hepatitis B 01/05/2001, 02/01/2001, 05/04/2005   Hepatitis B, PED/ADOLESCENT 01/05/2001, 02/01/2001, 05/04/2005   IPV 01/03/1990, 03/17/1990, 02/20/1991, 06/01/1994   Influenza, Seasonal, Injecte, Preservative Fre 12/28/2022   Influenza,inj,Quad PF,6+ Mos 02/20/2019, 02/16/2021   Influenza,inj,quad, With Preservative 03/18/2018   Influenza-Unspecified 03/18/2018   MMR 02/20/1991, 06/01/1994   Meningococcal Conjugate 05/04/2005   Pneumococcal Polysaccharide-23 08/29/2016, 02/20/2019   Tdap 05/04/2005, 12/26/2019     Objective: Vital Signs: BP (!) 137/92 (BP Location: Left Arm, Patient Position: Sitting, Cuff Size: Large)   Pulse 76   Resp 17   Ht 6\' 2"  (1.88 m)   Wt (!) 319 lb 9.6 oz (145 kg)   BMI 41.03 kg/m    Physical Exam Vitals and nursing note reviewed.  Constitutional:      Appearance: He is well-developed.  HENT:     Head: Normocephalic and atraumatic.  Eyes:      Conjunctiva/sclera: Conjunctivae normal.     Pupils: Pupils are equal, round, and reactive to light.  Cardiovascular:     Rate and Rhythm: Normal rate and regular rhythm.     Heart sounds: Normal heart sounds.  Pulmonary:     Effort: Pulmonary effort is normal.     Breath sounds: Normal breath sounds.  Abdominal:     General: Bowel sounds are normal.     Palpations: Abdomen is soft.  Musculoskeletal:     Cervical back: Normal range of motion and neck supple.  Skin:    General: Skin is warm and dry.     Capillary Refill: Capillary refill takes less than 2 seconds.  Neurological:     Mental Status: He is alert and oriented to person, place, and time.  Psychiatric:        Behavior: Behavior normal.      Musculoskeletal Exam: C-spine, thoracic spine, and lumbar spine good ROM. No midline spinal tenderness.  No SI joint tenderness. Shoulder joints, elbow joints, wrist joints, MCPs, PIPs, and DIPs good ROM with no  synovitis.  Complete fist formation bilaterally.  Hip joints have good ROM with no groin pain.  Knee joints have good ROM with no warmth or effusion.  Ankle joints have good ROM with no tenderness or joint swelling.    CDAI Exam: CDAI Score: -- Patient Global: --; Provider Global: -- Swollen: --; Tender: -- Joint Exam 04/04/2023   No joint exam has been documented for this visit   There is currently no information documented on the homunculus. Go to the Rheumatology activity and complete the homunculus joint exam.  Investigation: No additional findings.  Imaging: No results found.  Recent Labs: Lab Results  Component Value Date   WBC 7.3 12/15/2022   HGB 15.5 12/15/2022   PLT 206 12/15/2022   NA 137 12/15/2022   K 3.7 12/15/2022   CL 101 12/15/2022   CO2 26 12/15/2022   GLUCOSE 77 12/15/2022   BUN 17 12/15/2022   CREATININE 1.00 12/15/2022   BILITOT 0.5 12/15/2022   ALKPHOS 69 09/22/2022   AST 41 (H) 12/15/2022   ALT 64 (H) 12/15/2022   PROT 7.8  12/15/2022   ALBUMIN 4.6 09/22/2022   CALCIUM 9.8 12/15/2022   GFRAA >60 02/19/2019   QFTBGOLDPLUS NEGATIVE 08/23/2022    Speciality Comments: Humira started December 26, 2020  Procedures:  No procedures performed Allergies: Other, Dust mite extract, and Pollen extract    Assessment / Plan:     Visit Diagnoses: Spondyloarthropathy - Inflammatory arthritis, iritis, chronic SI joint pain, chronic pain in neck, thoracic and lumbar region.  HLA-B27 positive: He has no synovitis or dactylitis on examination today.  No recurrence of a left knee joint effusion since having a cortisone injection performed on 12/15/2022.  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  He recently had to have a gap in therapy due to change in insurance but he has resumed Hyrimoz after 1 month.  He is tolerating Hyrimoz without any side effects or injection site reactions.  No medication changes will be made at this time.  He was advised to notify us if he develops signs or symptoms of a flare.  He will follow-up in the office in 3 months or sooner if needed.  Iritis: He has not had any signs or symptoms of an iritis flare.  No photophobia, conjunctival injection, or eye pain at this time.  Patient will remain on Hyrimoz as prescribed.  High risk medication use -Hyrimoz 40 mg sq injections every 14 days.  CBC and CMP updated on 12/15/22.  Orders for CBC and CMP released today.  TB gold negative on 08/23/22. Patient was diagnosed with COVID-19 towards the end of December 2024 followed by a viral upper respiratory infection.  He was off of Hyrimoz during that time.  Discussed the importance of holding Hyrimoz if she develops signs or symptoms of an infection and to resume once the infection has completely cleared.  -  Plan: COMPLETE METABOLIC PANEL WITH GFR, CBC with Differential/Platelet  HLA B27 positive  Entrapment of left ulnar nerve - Evaluated by Dr. August Saucer. NCV with EMG on 10/13/21.    Chronic pain of left knee - : X-rays of the left knee from 12/04/2020 were consistent with moderate osteoarthritis and inflammatory arthritis. He had a left knee joint cortisone injection  performed on 12/15/2022 which has alleviated his symptoms.  No recurrence of an effusion noted today.    DDD (degenerative disc disease), cervical: C-spine has good range of motion on examination today.  Pain in thoracic spine: No midline spinal tenderness at this time.  Chronic SI joint pain: No SI joint tenderness.  Other fatigue: His energy level has been stable.  Other medical conditions are listed as follows:   Schizoaffective disorder, bipolar type (HCC)  SIRS (systemic inflammatory response syndrome) (HCC)  Prolonged QT interval  Palpitations  Mild persistent allergic asthma  Alcohol use disorder, severe, dependence (HCC)  Smoker  Allergic urticaria  Orders: Orders Placed This Encounter  Procedures   COMPLETE METABOLIC PANEL WITH GFR   CBC with Differential/Platelet   Meds ordered this encounter  Medications   diclofenac Sodium (VOLTAREN) 1 % GEL    Sig: Apply 2-4 grams to affected joint 4 times daily as needed.    Dispense:  400 g    Refill:  2     Follow-Up Instructions: Return in about 3 months (around 07/03/2023) for Ankylosing Spondylitis.   Gearldine Bienenstock, PA-C  Note - This record has been created using Dragon software.  Chart creation errors have been sought, but may not always  have been located. Such creation errors do not reflect on  the standard of medical care.

## 2023-03-21 NOTE — Telephone Encounter (Signed)
 Patient contacted the office to request a medication refill.   1. Name of Medication: Humira   2. How are you currently taking this medication (dosage and times per day)? Once every 14 days   3. What pharmacy would you like for that to be sent to? [facility]- [place]

## 2023-03-21 NOTE — Telephone Encounter (Signed)
 Patient left a voicemail (03/20/23 at 4:00 pm) stating he is out of his Humira  medication.  Patient states he has new insurance and is not sure how the process is going to work since he is not on Medicaid anymore.   [facility] #[id number]  Group #[id number] Rx BIN [id number] Rx PCN [phone]

## 2023-03-22 ENCOUNTER — Other Ambulatory Visit (HOSPITAL_COMMUNITY): Payer: Self-pay

## 2023-03-22 MED ORDER — ADALIMUMAB-ADAZ 40 MG/0.4ML ~~LOC~~ SOAJ: 40.0000 mg | SUBCUTANEOUS | 0 refills | Status: DC

## 2023-03-22 NOTE — Telephone Encounter (Signed)
 Received notification from [facility] regarding a prior authorization for  HYRIMOZ  . Authorization has been APPROVED from 03/21/2023 to 03/20/2024. Approval letter sent to scan center.  Patient must fill through [facility]: [phone]  Authorization # [id number]  Left VM for patient. MyChart message sent to patient as well  Sherry Pennant, PharmD, MPH, BCPS, CPP Clinical Pharmacist (Rheumatology and Pulmonology)

## 2023-03-24 ENCOUNTER — Other Ambulatory Visit: Payer: Self-pay

## 2023-03-24 NOTE — Progress Notes (Signed)
Patient must fill with [facility] Disen[facility].

## 2023-03-29 ENCOUNTER — Other Ambulatory Visit: Payer: Self-pay | Admitting: Rheumatology

## 2023-03-29 DIAGNOSIS — H209 Unspecified iridocyclitis: Secondary | ICD-10-CM

## 2023-03-29 DIAGNOSIS — Z79899 Other long term (current) drug therapy: Secondary | ICD-10-CM

## 2023-03-29 DIAGNOSIS — M47819 Spondylosis without myelopathy or radiculopathy, site unspecified: Secondary | ICD-10-CM

## 2023-03-30 ENCOUNTER — Ambulatory Visit: Payer: 59 | Admitting: Family Medicine
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
## 2023-03-31 ENCOUNTER — Encounter: Payer: Self-pay | Admitting: Family Medicine

## 2023-04-04 ENCOUNTER — Encounter: Payer: Self-pay | Admitting: Physician Assistant

## 2023-04-04 ENCOUNTER — Ambulatory Visit: Payer: 59 | Attending: Physician Assistant | Admitting: Physician Assistant

## 2023-04-04 VITALS — BP 137/92 | HR 76 | Resp 17 | Ht 74.0 in | Wt 319.6 lb

## 2023-04-04 DIAGNOSIS — F1020 Alcohol dependence, uncomplicated: Secondary | ICD-10-CM

## 2023-04-04 DIAGNOSIS — F250 Schizoaffective disorder, bipolar type: Secondary | ICD-10-CM

## 2023-04-04 DIAGNOSIS — G5622 Lesion of ulnar nerve, left upper limb: Secondary | ICD-10-CM

## 2023-04-04 DIAGNOSIS — M5030 Other cervical disc degeneration, unspecified cervical region: Secondary | ICD-10-CM | POA: Diagnosis not present

## 2023-04-04 DIAGNOSIS — M25562 Pain in left knee: Secondary | ICD-10-CM | POA: Diagnosis not present

## 2023-04-04 DIAGNOSIS — Z79899 Other long term (current) drug therapy: Secondary | ICD-10-CM

## 2023-04-04 DIAGNOSIS — M533 Sacrococcygeal disorders, not elsewhere classified: Secondary | ICD-10-CM

## 2023-04-04 DIAGNOSIS — M47819 Spondylosis without myelopathy or radiculopathy, site unspecified: Secondary | ICD-10-CM | POA: Diagnosis not present

## 2023-04-04 DIAGNOSIS — H209 Unspecified iridocyclitis: Secondary | ICD-10-CM | POA: Diagnosis not present

## 2023-04-04 DIAGNOSIS — M546 Pain in thoracic spine: Secondary | ICD-10-CM

## 2023-04-04 DIAGNOSIS — R5383 Other fatigue: Secondary | ICD-10-CM

## 2023-04-04 DIAGNOSIS — R6510 Systemic inflammatory response syndrome (SIRS) of non-infectious origin without acute organ dysfunction: Secondary | ICD-10-CM

## 2023-04-04 DIAGNOSIS — L500 Allergic urticaria: Secondary | ICD-10-CM

## 2023-04-04 DIAGNOSIS — Z1589 Genetic susceptibility to other disease: Secondary | ICD-10-CM

## 2023-04-04 DIAGNOSIS — G8929 Other chronic pain: Secondary | ICD-10-CM

## 2023-04-04 DIAGNOSIS — R002 Palpitations: Secondary | ICD-10-CM

## 2023-04-04 DIAGNOSIS — J4530 Mild persistent asthma, uncomplicated: Secondary | ICD-10-CM

## 2023-04-04 DIAGNOSIS — F17200 Nicotine dependence, unspecified, uncomplicated: Secondary | ICD-10-CM

## 2023-04-04 DIAGNOSIS — R9431 Abnormal electrocardiogram [ECG] [EKG]: Secondary | ICD-10-CM

## 2023-04-04 MED ORDER — DICLOFENAC SODIUM 1 % EX GEL: CUTANEOUS | 2 refills | Status: DC

## 2023-04-05 ENCOUNTER — Telehealth: Payer: Self-pay | Admitting: *Deleted

## 2023-04-05 LAB — COMPLETE METABOLIC PANEL WITH GFR
AG Ratio: 1.4 (calc) (ref 1.0–2.5)
ALT: 53 U/L — ABNORMAL HIGH (ref 9–46)
AST: 29 U/L (ref 10–40)
Albumin: 4.6 g/dL (ref 3.6–5.1)
Alkaline phosphatase (APISO): 57 U/L (ref 36–130)
BUN: 18 mg/dL (ref 7–25)
CO2: 23 mmol/L (ref 20–32)
Calcium: 9.8 mg/dL (ref 8.6–10.3)
Chloride: 102 mmol/L (ref 98–110)
Creat: 0.82 mg/dL (ref 0.60–1.26)
Globulin: 3.4 g/dL (ref 1.9–3.7)
Glucose, Bld: 87 mg/dL (ref 65–99)
Potassium: 4.4 mmol/L (ref 3.5–5.3)
Sodium: 137 mmol/L (ref 135–146)
Total Bilirubin: 0.5 mg/dL (ref 0.2–1.2)
Total Protein: 8 g/dL (ref 6.1–8.1)
eGFR: 119 mL/min/{1.73_m2} (ref >=60)

## 2023-04-05 LAB — CBC WITH DIFFERENTIAL/PLATELET
Absolute Lymphocytes: 2517 {cells}/uL (ref 850–3900)
Absolute Monocytes: 527 {cells}/uL (ref 200–950)
Basophils Absolute: 87 {cells}/uL (ref 0–200)
Basophils Relative: 1.4 %
Eosinophils Absolute: 291 {cells}/uL (ref 15–500)
Eosinophils Relative: 4.7 %
HCT: 47.3 % (ref 38.5–50.0)
Hemoglobin: 15.9 g/dL (ref 13.2–17.1)
MCH: 30.5 pg (ref 27.0–33.0)
MCHC: 33.6 g/dL (ref 32.0–36.0)
MCV: 90.6 fL (ref 80.0–100.0)
MPV: 12.5 fL (ref 7.5–12.5)
Monocytes Relative: 8.5 %
Neutro Abs: 2778 {cells}/uL (ref 1500–7800)
Neutrophils Relative %: 44.8 %
Platelets: 184 10*3/uL (ref 140–400)
RBC: 5.22 10*6/uL (ref 4.20–5.80)
RDW: 13 % (ref 11.0–15.0)
Total Lymphocyte: 40.6 %
WBC: 6.2 10*3/uL (ref 3.8–10.8)

## 2023-04-05 NOTE — Progress Notes (Signed)
ALT remains slightly elevated but has improved. AST is now WNL. Rest of CMP WNL.  CBC WNL

## 2023-04-05 NOTE — Telephone Encounter (Signed)
Error

## 2023-04-14 ENCOUNTER — Other Ambulatory Visit: Payer: Self-pay

## 2023-04-14 ENCOUNTER — Ambulatory Visit (INDEPENDENT_AMBULATORY_CARE_PROVIDER_SITE_OTHER): Payer: 59 | Admitting: Psychiatry

## 2023-04-14 DIAGNOSIS — F250 Schizoaffective disorder, bipolar type: Secondary | ICD-10-CM

## 2023-04-14 DIAGNOSIS — M47819 Spondylosis without myelopathy or radiculopathy, site unspecified: Secondary | ICD-10-CM

## 2023-04-14 DIAGNOSIS — M459 Ankylosing spondylitis of unspecified sites in spine: Secondary | ICD-10-CM

## 2023-04-14 DIAGNOSIS — Z638 Other specified problems related to primary support group: Secondary | ICD-10-CM | POA: Diagnosis not present

## 2023-04-14 DIAGNOSIS — Z79899 Other long term (current) drug therapy: Secondary | ICD-10-CM

## 2023-04-14 DIAGNOSIS — F1020 Alcohol dependence, uncomplicated: Secondary | ICD-10-CM

## 2023-04-14 DIAGNOSIS — F411 Generalized anxiety disorder: Secondary | ICD-10-CM | POA: Diagnosis not present

## 2023-04-14 DIAGNOSIS — H209 Unspecified iridocyclitis: Secondary | ICD-10-CM

## 2023-04-14 MED ORDER — ADALIMUMAB-ADAZ 40 MG/0.4ML ~~LOC~~ SOAJ: 40.0000 mg | SUBCUTANEOUS | 0 refills | Status: DC

## 2023-04-14 NOTE — Progress Notes (Signed)
 Psychotherapy Progress Note [facility] [name], PhD LP  Patient ID: Leon Mason)    MRN: [id number] Therapy format: Individual psychotherapy Date: 04/14/2023      Start: 4:20p     Stop: 5:05p     Time Spent: 45 min Location: In-person   Session narrative (presenting needs, interim history, self-report of stressors and symptoms, applications of prior therapy, status changes, and interventions made in session) Apologetic for noshow in January.  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  Asks, shrewdly, about any tips for managing if society and government goes through some kind of meltdown.  Obviously unable to forecast how things will turn, but first, best advice to strike up relationships with neighbors, to engage them directly in low-key, nondemanding ways just to establish any sense of humanness and helpfulness that would help weigh against any urges someone else might have to violate his space or possessions.  Should things turn tribal, say, it would make him more likely one of us .  (Ironically, a neighbor has already approached him, albeit on a winter weather day, seeing him spreading rock salt, asked if he could tell him where he could get some so his lawyer could argue that the meth cops found could be claimed to be rock salt.)  Working Geophysicist/field Seismologist now, which is helping establish more socialization and regular to regular interactions.  Changed to it for more of a sense of independence and meritocracy.  Struggling to cover rent but paying all his other bills.  Did complete his move to apartment of his own, which is comfortable enough.  Cooking for himself, bathing and grooming regularly.  Experienced tension way down -- est went from 8/10 to 1/10 for moving.  Still drinking every evening, but trying to work activities.  Has established regular contact with astronomy group and a spiritual group.  Quit a martial arts  studio due to money -- was paying $99.    Affirmed branching out in activities, setting up home, and restabilizing his work.  Toward depression and alcohol relief, recommended he keep a record somehow -- e.g., a [employer] style thermometer -- of any time he gets an urge to drink but puts it off, or any time he does not buy alcohol, and mark the money saved, so he can self-reward both abstinence and frugality.  Otherwise continue to seek something else first before deciding to indulge, and preferably do not keep any in the house -- make himself go out for it if he truly wants it badly enough.    Therapeutic modalities: Cognitive Behavioral Therapy, Solution-Oriented/Positive Psychology, and Ego-Supportive  Mental Status/Observations:  Appearance:   Casual     Behavior:  Appropriate  Motor:  Normal  Speech/Language:   Clear and Coherent  Affect:  Appropriate  Mood:  dysthymic  Thought process:  normal  Thought content:    WNL  Sensory/Perceptual disturbances:    WNL  Orientation:  Fully oriented  Attention:  Good    Concentration:  Fair  Memory:  WNL  Insight:    Variable  Judgment:   Variable  Impulse Control:  Fair   Risk Assessment: Danger to Self: No Self-injurious Behavior: No Danger to Others: No Physical Aggression / Violence: No Duty to Warn: No Access to Firearms a concern: No  Assessment of progress:  progressing  Diagnosis:   ICD-10-CM   1. Schizoaffective disorder, bipolar type (HCC)  F25.0     2. Generalized anxiety  disorder  F41.1     3. Alcohol use disorder, moderate, dependence (HCC)  F10.20     4. Relationship problems with multiple family members  Z63.8     5. Ankylosing spondylitis, unspecified site of spine (HCC) -- by self-report  M45.9      Plan:  Sobriety -- Seek abstinence for health and life.  Prioritize arranging escape time from the house to enjoy driving, games while parked, or meet ups with others and make sure to notice how it is enough  relief without drinking.  Check [email or web address] for possible interests he might not think of. Work -- Programme Researcher, Broadcasting/film/video work best able, pursue better as health allows.  Previous assessment that he qualifies as disabled, but no opinion at present.  In favor of working if able. Health care -- By all means treat pre-cirrhosis appropriately, and follow recommendations for ankylosing spondylitis. Household stability and emergency welfare -- Likely benefit of ACTT team -- may ask DSS or the VA (in [name]).  Contact [place] Adult Protective Services if anything rises to the level of neglect, abuse, or inability to manage health among the three in the household.  Mobile crisis service or law enforcement as needed if domestic dispute intensifies.  Address coverable DME needs for [name] Loose to her VA case worker, if identifiable.  If charitable sources for furniture, affordable cleaning, or other needs appropriate to elder, disabled, veteran are needed, check with DSS or VA as appropriate.  Continuing recommendations to household for mutual sobriety, engage 12-step support or affordable counseling.  In the long run, Pt best served by exiting the household and establishing himself in his own space. Dating/relationship -- Open, honest communication about sex and interests, categorically respect her limits Other recommendations/advice -- As may be noted above.  Continue to utilize previously learned skills ad lib. Medication compliance -- Maintain medication as prescribed and work faithfully with relevant prescriber(s) if any changes are desired or seem indicated. Crisis service -- Aware of call list and work-in appts.  Call the clinic on-call service, 988/hotline, 911, or present to [name] or ER if any life-threatening psychiatric crisis. Followup -- Return for time as already scheduled.  Next scheduled visit with me 05/10/2023.  Next scheduled in this office 05/09/2023.  [name], PhD [name], PhD LP Clinical  Psychologist, [facility] [address] [place], [place] [address] [phone]

## 2023-04-14 NOTE — Telephone Encounter (Addendum)
 [facility] called requesting a refilll of hyrimoz  for patient  Last Fill: 03/22/2023  Labs: 04/04/2023 ALT remains slightly elevated but has improved. AST is now WNL. Rest of CMP WNL. CBC WNL  TB Gold: 08/23/2022 negative    Next Visit: 07/04/2023  Last Visit: 04/04/2023  IK:[id number]   Current Dose per office note on 04/04/2023: Hyrimoz  40 mg sq injections every 14 days.   Okay to refill Hyrimoz ?      I called [facility] to clarify that 6mL is converted to 6 pens as I was advised by the first phone call that a 30 day supply was sent on 03/22/2023 in the quantity of . I was advised that in their system 6mL is converted to 6 pens which is 3 cartons (2 pens per carton) just as 2.4mL converts to 6 pens (3 cartons) in their system.

## 2023-05-09 ENCOUNTER — Telehealth: Payer: Self-pay | Admitting: Adult Health

## 2023-05-10 ENCOUNTER — Ambulatory Visit (INDEPENDENT_AMBULATORY_CARE_PROVIDER_SITE_OTHER): Payer: No Typology Code available for payment source | Admitting: Psychiatry

## 2023-05-10 DIAGNOSIS — Z638 Other specified problems related to primary support group: Secondary | ICD-10-CM | POA: Diagnosis not present

## 2023-05-10 DIAGNOSIS — F250 Schizoaffective disorder, bipolar type: Secondary | ICD-10-CM | POA: Diagnosis not present

## 2023-05-10 DIAGNOSIS — F1020 Alcohol dependence, uncomplicated: Secondary | ICD-10-CM

## 2023-05-10 DIAGNOSIS — M459 Ankylosing spondylitis of unspecified sites in spine: Secondary | ICD-10-CM | POA: Diagnosis not present

## 2023-05-10 DIAGNOSIS — F411 Generalized anxiety disorder: Secondary | ICD-10-CM
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
## 2023-05-13 ENCOUNTER — Telehealth: Payer: Self-pay | Admitting: Adult Health

## 2023-05-13 ENCOUNTER — Encounter: Payer: Self-pay | Admitting: Adult Health

## 2023-05-13 DIAGNOSIS — R9431 Abnormal electrocardiogram [ECG] [EKG]: Secondary | ICD-10-CM

## 2023-05-13 DIAGNOSIS — G4700 Insomnia, unspecified: Secondary | ICD-10-CM

## 2023-05-13 DIAGNOSIS — F250 Schizoaffective disorder, bipolar type: Secondary | ICD-10-CM | POA: Diagnosis not present

## 2023-05-13 DIAGNOSIS — F411 Generalized anxiety disorder: Secondary | ICD-10-CM | POA: Diagnosis not present

## 2023-05-13 DIAGNOSIS — F331 Major depressive disorder, recurrent, moderate: Secondary | ICD-10-CM

## 2023-05-13 MED ORDER — PERPHENAZINE 4 MG PO TABS: 4.0000 mg | ORAL_TABLET | Freq: Two times a day (BID) | ORAL | 1 refills | Status: DC

## 2023-05-13 MED ORDER — SERTRALINE HCL 50 MG PO TABS: 50.0000 mg | ORAL_TABLET | Freq: Every day | ORAL | 1 refills | Status: DC

## 2023-05-13 NOTE — Progress Notes (Signed)
 Leon Mason [id number] May 14, 1990 33 y.o.  Virtual Visit via Video Note  I connected with pt @ on 05/13/23 at  1:00 PM EST by a video enabled telemedicine application and verified that I am speaking with the correct person using two identifiers.   I discussed the limitations of evaluation and management by telemedicine and the availability of in person appointments. The patient expressed understanding and agreed to proceed.  I discussed the assessment and treatment plan with the patient. The patient was provided an opportunity to ask questions and all were answered. The patient agreed with the plan and demonstrated an understanding of the instructions.   The patient was advised to call back or seek an in-person evaluation if the symptoms worsen or if the condition fails to improve as anticipated.  I provided 25 minutes of non-face-to-face time during this encounter.  The patient was located at home.  The provider was located at [facility].   Dorothyann Gibbs, NP   Subjective:   Patient ID:  Leon Mason is a 33 y.o. (DOB 03-Sep-1990) male.  Chief Complaint: No chief complaint on file.   HPI Lindel Marcell Flagler presents for follow-up of MDD, GAD, insomnia, Schizophrenia, Prolonged QT interval.  Describes mood today as "ok". Pleasant. Mood symptoms - reports depression, anxiety, and irritability. Reports decreased interest and motivation. Reports panic attacks. Reports some worry rumination and over thinking. Mood is lower. Stating "I don't feel like I'm doing too good". Reports abusing alcohol, THC and other substances. Feels like medications are helpful, but would like to consider other options. Taking medications as prescribed. Energy levels stable. Active, working out 4 days a week. Enjoys some usual interests and activities. Single. Lives alone. Mother (disabled) and brother live 25 minutes away. Spending time with friends.  Appetite adequate - eating a healthy diet.  Weight stable 315 pounds. Sleeps better some nights than others. Averages 8 or more hours. Focus and concentration stable. Completing tasks. Managing aspects of household. 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   Reports passive SI with no plan - worried about the state of the country. Denies HI. Denies AH or VH. Reports paranoid thoughts - someone may break into my house and harm me - concerned about politics. Denies self harm. Denies substance use. Reports alcohol use - 4 days a week from 6 to 7 days a week - doing a lot better now.    Seeing [name] for therapy.  Previous medications: Perphenazine, Lithium, Sertraline, Haldol, Zyprexa, Risperdal, stopped due to various side effects, ie biting tongue a lot, restless legs, weight gain   Review of Systems:  Review of Systems  Musculoskeletal:  Negative for gait problem.  Neurological:  Negative for tremors.  Psychiatric/Behavioral:         Please refer to HPI    Medications: I have reviewed the patient's current medications.  Current Outpatient Medications  Medication Sig Dispense Refill   Adalimumab-adaz (HYRIMOZ) 40 MG/0.4ML SOAJ Inject 40 mg into the skin every 14 (fourteen) days. 6 mL 0   albuterol (PROAIR HFA) 108 (90 Base) MCG/ACT inhaler Inhale 2 puffs into the lungs every 6 (six) hours as needed for shortness of breath. 6.7 g 0   diclofenac Sodium (VOLTAREN) 1 % GEL Apply 2-4 grams to affected joint 4 times daily as needed. 400 g 2   diphenhydrAMINE-Zinc Acetate (BENADRYL EX) Apply topically as needed. (Patient not taking: Reported on 04/04/2023)     fluticasone-salmeterol (ADVAIR) 250-50 MCG/ACT  AEPB Inhale 1 puff into the lungs daily.     folic acid (FOLVITE) 1 MG tablet Take 1 tablet (1 mg total) by mouth daily. (Patient not taking: Reported on 04/04/2023) 90 tablet 1   IBUPROFEN PO Take by mouth as needed.     Multiple Vitamin (MULTIVITAMIN PO) Take by mouth daily.     perphenazine (TRILAFON) 4 MG  tablet Take 1 tablet (4 mg total) by mouth 2 (two) times daily. 180 tablet 1   sertraline (ZOLOFT) 50 MG tablet Take 1 tablet (50 mg total) by mouth daily. 90 tablet 1   thiamine (VITAMIN B1) 100 MG tablet Take 1 tablet (100 mg total) by mouth daily. (Patient not taking: Reported on 04/04/2023) 90 tablet 1   triamcinolone cream (KENALOG) 0.1 % Apply 1 Application topically 2 (two) times daily as needed (poison oak/ivy rash (max 10 days per use)). (Patient not taking: Reported on 04/04/2023) 80 g 0   UNABLE TO FIND Med Name: Bug spray topically, daily use. (Patient not taking: Reported on 04/04/2023)     No current facility-administered medications for this visit.    Medication Side Effects: None  Allergies:  Allergies  Allergen Reactions   Other Hives and Shortness Of Breath    Dogs and Cats.   Dust Mite Extract    Pollen Extract     Past Medical History:  Diagnosis Date   Anxiety    Asthma    Bipolar 1 disorder (HCC)    Hallucinations    Mild intermittent asthma with (acute) exacerbation 02/18/2019   Psychosis (HCC) 06/14/2013   Schizo-affective schizophrenia, chronic condition with acute exacerbation (HCC) 03/28/2018   Schizoaffective disorder (HCC)    Schizophrenia (HCC)    SIRS (systemic inflammatory response syndrome) (HCC) 02/18/2019    Family History  Problem Relation Age of Onset   Bipolar disorder Mother    Alcoholism Mother    Diabetes Mother    Cervical cancer Mother    OCD Father    Bipolar disorder Father    Eczema Brother    Post-traumatic stress disorder Brother    COPD Maternal Aunt    Heart attack Maternal Grandmother    Breast cancer Maternal Grandmother    Heart attack Maternal Grandfather    Cancer Paternal Grandmother    Heart attack Paternal Grandfather    Asthma Neg Hx    Allergic rhinitis Neg Hx    Angioedema Neg Hx    Urticaria Neg Hx    Immunodeficiency Neg Hx    Atopy Neg Hx    Prostate cancer Neg Hx    Colon cancer Neg Hx     Social  History   Socioeconomic History   Marital status: Single    Spouse name: Not on file   Number of children: Not on file   Years of education: Not on file   Highest education level: Associate degree: occupational, Scientist, product/process development, or vocational program  Occupational History   Not on file  Tobacco Use   Smoking status: Former    Current packs/day: 0.00    Types: Cigarettes    Start date: 11/27/2017    Quit date: 05/28/2018    Years since quitting: 4.9    Passive exposure: Past   Smokeless tobacco: Never  Vaping Use   Vaping status: Former  Substance and Sexual Activity   Alcohol use: Yes    Alcohol/week: 14.0 standard drinks of alcohol    Types: 14 Cans of beer per week    Comment: 80-120oz daily  Drug use: Not Currently    Types: Marijuana   Sexual activity: Not Currently  Other Topics Concern   Not on file  Social History Narrative   Not on file   Social Drivers of Health   Financial Resource Strain: High Risk (12/24/2022)   Overall Financial Resource Strain (CARDIA)    Difficulty of Paying Living Expenses: Very hard  Food Insecurity: Food Insecurity Present (12/24/2022)   Hunger Vital Sign    Worried About Running Out of Food in the Last Year: Often true    Ran Out of Food in the Last Year: Often true  Transportation Needs: Unmet Transportation Needs (12/24/2022)   PRAPARE - Administrator, Civil Service (Medical): Yes    Lack of Transportation (Non-Medical): Yes  Physical Activity: Sufficiently Active (12/24/2022)   Exercise Vital Sign    Days of Exercise per Week: 5 days    Minutes of Exercise per Session: 120 min  Stress: Stress Concern Present (01/03/2023)   [employer] of Occupational Health - Occupational Stress Questionnaire    Feeling of Stress : Very much  Social Connections: Socially Isolated (12/24/2022)   Social Connection and Isolation Panel [NHANES]    Frequency of Communication with Friends and Family: Never    Frequency of Social  Gatherings with Friends and Family: Never    Attends Religious Services: Never    Database administrator or Organizations: Yes    Attends Engineer, structural: More than 4 times per year    Marital Status: Never married  Intimate Partner Violence: Unknown (06/12/2021)   Received from [facility], [facility]   HITS    Physically Hurt: Not on file    Insult or Talk Down To: Not on file    Threaten Physical Harm: Not on file    Scream or Curse: Not on file    Past Medical History, Surgical history, Social history, and Family history were reviewed and updated as appropriate.   Please see review of systems for further details on the patient's review from today.   Objective:   Physical Exam:  There were no vitals taken for this visit.  Physical Exam Constitutional:      General: He is not in acute distress. Musculoskeletal:        General: No deformity.  Neurological:     Mental Status: He is alert and oriented to person, place, and time.     Coordination: Coordination normal.  Psychiatric:        Attention and Perception: Attention and perception normal. He does not perceive auditory or visual hallucinations.        Mood and Affect: Affect is not labile, blunt, angry or inappropriate.        Speech: Speech normal.        Behavior: Behavior normal.        Thought Content: Thought content normal. Thought content is not paranoid or delusional. Thought content does not include homicidal or suicidal ideation. Thought content does not include homicidal or suicidal plan.        Cognition and Memory: Cognition and memory normal.        Judgment: Judgment normal.     Comments: Insight intact     Lab Review:     Component Value Date/Time   NA 137 04/04/2023 1344   NA 138 09/22/2022 1520   K 4.4 04/04/2023 1344   CL 102 04/04/2023 1344   CO2 23 04/04/2023 1344   GLUCOSE 87 04/04/2023  1344   BUN 18 04/04/2023 1344   BUN 15 09/22/2022 1520   CREATININE 0.82 04/04/2023  1344   CALCIUM 9.8 04/04/2023 1344   PROT 8.0 04/04/2023 1344   PROT 7.6 09/22/2022 1520   ALBUMIN 4.6 09/22/2022 1520   AST 29 04/04/2023 1344   ALT 53 (H) 04/04/2023 1344   ALKPHOS 69 09/22/2022 1520   BILITOT 0.5 04/04/2023 1344   BILITOT 0.4 09/22/2022 1520   GFRNONAA >60 08/25/2022 1243   GFRAA >60 02/19/2019 0602       Component Value Date/Time   WBC 6.2 04/04/2023 1344   RBC 5.22 04/04/2023 1344   HGB 15.9 04/04/2023 1344   HGB 14.8 09/22/2022 1520   HCT 47.3 04/04/2023 1344   HCT 43.5 09/22/2022 1520   PLT 184 04/04/2023 1344   PLT 181 09/22/2022 1520   MCV 90.6 04/04/2023 1344   MCV 94 09/22/2022 1520   MCH 30.5 04/04/2023 1344   MCHC 33.6 04/04/2023 1344   RDW 13.0 04/04/2023 1344   RDW 14.3 09/22/2022 1520   LYMPHSABS 2,373 12/15/2022 1510   MONOABS 0.7 08/25/2022 1243   EOSABS 291 04/04/2023 1344   BASOSABS 87 04/04/2023 1344    Lithium Lvl  Date Value Ref Range Status  03/30/2018 <0.06 (L) 0.60 - 1.20 mmol/L Final    Comment:    REPEATED TO VERIFY Performed at [facility], [address] [place], [place] [address]      No results found for: "PHENYTOIN", "PHENOBARB", "VALPROATE", "CBMZ"   .res Assessment: Plan:    Plan:  PDMP reviewed  Discussed alcohol issues and plans to work on alcohol cessation to help improve mood. Also advised to abstain from any form of THC.   Continue: Zoloft 50mg   Perphenazine - 4mg  - 2 at hs.  Restart therapy - Andy Mitchum.  EKG - prolonged QT interval - last EKG 11/01/2022 - advised to schedule EKG.  RTC 3 months  15 minutes spent dedicated to the care of this patient on the date of this encounter to include pre-visit review of records, ordering of medication, post visit documentation, and face-to-face time with the patient discussing MDD, GAD, insomnia, Schizophrenia, Prolonged QT interval. Discussed continuing current medication regimen.  Patient advised to contact office with any  questions, adverse effects, or acute worsening in signs and symptoms.  Discussed potential metabolic side effects associated with atypical antipsychotics, as well as potential risk for movement side effects. Advised pt to contact office if movement side effects occur.    Diagnoses and all orders for this visit:  Major depressive disorder, recurrent episode, moderate (HCC) -     sertraline (ZOLOFT) 50 MG tablet; Take 1 tablet (50 mg total) by mouth daily.  Generalized anxiety disorder -     sertraline (ZOLOFT) 50 MG tablet; Take 1 tablet (50 mg total) by mouth daily.  Insomnia, unspecified type -     sertraline (ZOLOFT) 50 MG tablet; Take 1 tablet (50 mg total) by mouth daily.  Schizoaffective disorder, bipolar type (HCC) -     perphenazine (TRILAFON) 4 MG tablet; Take 1 tablet (4 mg total) by mouth 2 (two) times daily.  Prolonged QT interval -     EKG 12-Lead     Please see After Visit Summary for patient specific instructions.  Future Appointments  Date Time Provider Department Center  05/23/2023  2:30 PM Raliegh Ip, DO [facility] None  06/13/2023  3:00 PM Robley Fries, PhD [facility] None  07/04/2023  1:30 PM  Gearldine Bienenstock, PA-C [facility] None  07/13/2023  3:00 PM Robley Fries, PhD [facility] None  08/10/2023  1:00 PM Robley Fries, PhD [facility] None    Orders Placed This Encounter  Procedures   EKG 12-Lead      -------------------------------

## 2023-05-17 ENCOUNTER — Encounter: Admitting: Professional Counselor

## 2023-05-23 ENCOUNTER — Telehealth: Payer: Self-pay | Admitting: Family Medicine

## 2023-05-23 ENCOUNTER — Telehealth: Payer: Self-pay

## 2023-05-23 ENCOUNTER — Ambulatory Visit: Payer: 59 | Admitting: Family Medicine

## 2023-05-23 NOTE — Telephone Encounter (Signed)
 Copied from CRM [id number]. Topic: General - Other >> May 23, 2023 11:22 AM DeAngela L wrote: Reason for CRM: Patient would like to know if he could get an EKG done on the day of his appt rescheduled for 06/01/23

## 2023-05-23 NOTE — Telephone Encounter (Signed)
 Attempted to call pt to get r/s due to provider being out sick - if pt calls back please get r/s

## 2023-05-23 NOTE — Telephone Encounter (Signed)
 Spoke with pt , needing it for his psychiatrist

## 2023-06-01 ENCOUNTER — Ambulatory Visit: Admitting: Family Medicine

## 2023-06-01 ENCOUNTER — Encounter: Payer: Self-pay | Admitting: Family Medicine

## 2023-06-01 VITALS — BP 134/85 | HR 65 | Temp 98.7°F | Ht 74.0 in | Wt 316.6 lb

## 2023-06-01 DIAGNOSIS — Z6841 Body Mass Index (BMI) 40.0 and over, adult: Secondary | ICD-10-CM

## 2023-06-01 DIAGNOSIS — F250 Schizoaffective disorder, bipolar type: Secondary | ICD-10-CM

## 2023-06-01 DIAGNOSIS — F331 Major depressive disorder, recurrent, moderate: Secondary | ICD-10-CM

## 2023-06-01 DIAGNOSIS — R9431 Abnormal electrocardiogram [ECG] [EKG]: Secondary | ICD-10-CM

## 2023-06-01 DIAGNOSIS — F1020 Alcohol dependence, uncomplicated: Secondary | ICD-10-CM

## 2023-06-01 NOTE — Patient Instructions (Signed)

## 2023-06-01 NOTE — Progress Notes (Signed)
 Subjective: CC: Checkup PCP: Raliegh Ip, DO ZOX:[id number] A Leon Mason is a 33 y.o. male presenting to clinic today for:  1.  47-month checkup for follow-up on mood, alcohol use disorder Patient continues to drink about 80 to 100 ounces every other day of alcohol.  He is following up with his psychiatrist monthly and finds this to be very helpful.  He continues to also see the counselor. he is stable on current medications as prescribed.  They wanted an EKG to follow-up on the QTc prolongation that was noted last year.  He reports no heart palpitations.  In fact he is exercising several days per week for about 2 hours per session.  He is trying to clean up diet and focus on health.  Since her last visit he moved into his own apartment and got his car running.  He is now working for [employer] and things seem to be going better.  He notes he still is living month-to-month but overall the future is promising.  He is looking into perhaps going into electrician school.  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   ROS: Per HPI  Allergies  Allergen Reactions   Other Hives and Shortness Of Breath    Dogs and Cats.   Dust Mite Extract    Pollen Extract    Past Medical History:  Diagnosis Date   Anxiety    Asthma    Bipolar 1 disorder (HCC)    Hallucinations    Mild intermittent asthma with (acute) exacerbation 02/18/2019   Psychosis (HCC) 06/14/2013   Schizo-affective schizophrenia, chronic condition with acute exacerbation (HCC) 03/28/2018   Schizoaffective disorder (HCC)    Schizophrenia (HCC)    SIRS (systemic inflammatory response syndrome) (HCC) 02/18/2019    Current Outpatient Medications:    Adalimumab-adaz (HYRIMOZ) 40 MG/0.4ML SOAJ, Inject 40 mg into the skin every 14 (fourteen) days., Disp: 6 mL, Rfl: 0   albuterol (PROAIR HFA) 108 (90 Base) MCG/ACT inhaler, Inhale 2 puffs into the lungs every 6 (six) hours as needed for shortness of breath., Disp: 6.7  g, Rfl: 0   diclofenac Sodium (VOLTAREN) 1 % GEL, Apply 2-4 grams to affected joint 4 times daily as needed., Disp: 400 g, Rfl: 2   diphenhydrAMINE-Zinc Acetate (BENADRYL EX), Apply topically as needed., Disp: , Rfl:    fluticasone-salmeterol (ADVAIR) 250-50 MCG/ACT AEPB, Inhale 1 puff into the lungs daily., Disp: , Rfl:    folic acid (FOLVITE) 1 MG tablet, Take 1 tablet (1 mg total) by mouth daily., Disp: 90 tablet, Rfl: 1   IBUPROFEN PO, Take by mouth as needed., Disp: , Rfl:    Multiple Vitamin (MULTIVITAMIN PO), Take by mouth daily., Disp: , Rfl:    perphenazine (TRILAFON) 4 MG tablet, Take 1 tablet (4 mg total) by mouth 2 (two) times daily., Disp: 180 tablet, Rfl: 1   sertraline (ZOLOFT) 50 MG tablet, Take 1 tablet (50 mg total) by mouth daily., Disp: 90 tablet, Rfl: 1   thiamine (VITAMIN B1) 100 MG tablet, Take 1 tablet (100 mg total) by mouth daily., Disp: 90 tablet, Rfl: 1   triamcinolone cream (KENALOG) 0.1 %, Apply 1 Application topically 2 (two) times daily as needed (poison oak/ivy rash (max 10 days per use))., Disp: 80 g, Rfl: 0   UNABLE TO FIND, Med Name: Bug spray topically, daily use., Disp: , Rfl:  Social History   Socioeconomic History   Marital status: Single    Spouse name:  Not on file   Number of children: Not on file   Years of education: Not on file   Highest education level: Some college, no degree  Occupational History   Not on file  Tobacco Use   Smoking status: Former    Current packs/day: 0.00    Types: Cigarettes    Start date: 11/27/2017    Quit date: 05/28/2018    Years since quitting: 5.0    Passive exposure: Past   Smokeless tobacco: Never  Vaping Use   Vaping status: Former  Substance and Sexual Activity   Alcohol use: Yes    Alcohol/week: 14.0 standard drinks of alcohol    Types: 14 Cans of beer per week    Comment: 80-120oz daily   Drug use: Not Currently    Types: Marijuana   Sexual activity: Not Currently  Other Topics Concern   Not on  file  Social History Narrative   Not on file   Social Drivers of Health   Financial Resource Strain: High Risk (05/31/2023)   Overall Financial Resource Strain (CARDIA)    Difficulty of Paying Living Expenses: Very hard  Food Insecurity: Food Insecurity Present (05/31/2023)   Hunger Vital Sign    Worried About Running Out of Food in the Last Year: Often true    Ran Out of Food in the Last Year: Often true  Transportation Needs: Unmet Transportation Needs (05/31/2023)   PRAPARE - Transportation    Lack of Transportation (Medical): Yes    Lack of Transportation (Non-Medical): Yes  Physical Activity: Sufficiently Active (05/31/2023)   Exercise Vital Sign    Days of Exercise per Week: 4 days    Minutes of Exercise per Session: 100 min  Stress: Stress Concern Present (05/31/2023)   [employer] of Occupational Health - Occupational Stress Questionnaire    Feeling of Stress : Very much  Social Connections: Moderately Integrated (05/31/2023)   Social Connection and Isolation Panel [NHANES]    Frequency of Communication with Friends and Family: More than three times a week    Frequency of Social Gatherings with Friends and Family: Once a week    Attends Religious Services: More than 4 times per year    Active Member of [employer] or Organizations: Yes    Attends Banker Meetings: More than 4 times per year    Marital Status: Never married  Intimate Partner Violence: Unknown (06/12/2021)   Received from [facility], [facility]   HITS    Physically Hurt: Not on file    Insult or Talk Down To: Not on file    Threaten Physical Harm: Not on file    Scream or Curse: Not on file   Family History  Problem Relation Age of Onset   Bipolar disorder Mother    Alcoholism Mother    Diabetes Mother    Cervical cancer Mother    OCD Father    Bipolar disorder Father    Eczema Brother    Post-traumatic stress disorder Brother    COPD Maternal Aunt    Heart attack Maternal  Grandmother    Breast cancer Maternal Grandmother    Heart attack Maternal Grandfather    Cancer Paternal Grandmother    Heart attack Paternal Grandfather    Asthma Neg Hx    Allergic rhinitis Neg Hx    Angioedema Neg Hx    Urticaria Neg Hx    Immunodeficiency Neg Hx    Atopy Neg Hx    Prostate cancer Neg Hx  Colon cancer Neg Hx     Objective: Office vital signs reviewed. BP 134/85   Pulse 65   Temp 98.7 F (37.1 C)   Ht 6\' 2"  (1.88 m)   Wt (!) 316 lb 9.6 oz (143.6 kg)   SpO2 98%   BMI 40.65 kg/m   Physical Examination:  General: Awake, alert, obese, No acute distress HEENT: sclera white, MMM Cardio: regular rate and rhythm, S1S2 heard, no murmurs appreciated Pulm: clear to auscultation bilaterally, no wheezes, rhonchi or rales; normal work of breathing on room air PSych: Mood stable, speech normal, affect appropriate.  He was in much better spirits today than I have seen him in a while     06/01/2023    8:57 AM 03/18/2023    1:28 PM 12/28/2022    9:40 AM  Depression screen PHQ 2/9  Decreased Interest 2 3 3   Down, Depressed, Hopeless 3 3 3   PHQ - 2 Score 5 6 6   Altered sleeping 1 3 3   Tired, decreased energy 0 1 1  Change in appetite 0 3 3  Feeling bad or failure about yourself  0 3 3  Trouble concentrating 0 2 2  Moving slowly or fidgety/restless 0 0 0  Suicidal thoughts 0 2 2  PHQ-9 Score 6 20 20   Difficult doing work/chores Somewhat difficult Not difficult at all Extremely dIfficult      06/01/2023    8:56 AM 03/18/2023    1:28 PM 12/28/2022    9:40 AM 09/22/2022    2:35 PM  GAD 7 : Generalized Anxiety Score  Nervous, Anxious, on Edge 3 3 3 2   Control/stop worrying 3 3 3 2   Worry too much - different things 3 3 3 2   Trouble relaxing 1 3 3 2   Restless 0 0 0 1  Easily annoyed or irritable 1 3 3 2   Afraid - awful might happen 3 3 3 2   Total GAD 7 Score 14 18 18 13   Anxiety Difficulty  Extremely difficult Extremely difficult Somewhat difficult     Assessment/ Plan: 33 y.o. male   Prolonged QT interval - Plan: EKG 12-Lead  Moderate episode of recurrent major depressive disorder (HCC) - Plan: EKG 12-Lead  Schizoaffective disorder, bipolar type (HCC) - Plan: EKG 12-Lead  Alcohol use disorder, severe, dependence (HCC) - Plan: EKG 12-Lead  Morbid obesity (HCC) - Plan: Amb ref to Medical Nutrition Therapy-MNT  BMI 40.0-44.9, adult (HCC) - Plan: Amb ref to Medical Nutrition Therapy-MNT  QTc prolongation has resolved.  It was 400 today.  I will CC to psychiatrist as Lorain Childes  He has lost some weight since our last visit and seems to be really focusing on health.  I have referred him to nutrition to help aid him on this journey as well as given him information on salt intake etc.  Will plan for fasting labs at next visit  Encouraged him to continue to wean from alcohol.  His goal is to come off totally   Raliegh Ip, DO [facility] [phone]

## 2023-06-13 ENCOUNTER — Ambulatory Visit: Payer: 59 | Admitting: Psychiatry

## 2023-06-13 NOTE — Progress Notes (Signed)
 No-show/Short-notice cancellation note [name], PhD, [facility]  Patient ID: ZEBEDIAH BEEZLEY     MRN: [id number]     Date: 06/13/2023     Appt time: 3pm  Called on short notice to cancel for 3pm appointment, citing only that he would not be able to make it today.  While this is inadequate communication and late notice, waive fee due to enduring poverty.  RS as able.  Robley Fries, PhD [name], PhD LP Clinical Psychologist, [facility] [address] [place], [place] [address] [phone]

## 2023-06-20 NOTE — Progress Notes (Deleted)
 Office Visit Note  Patient: Leon Mason             Date of Birth: March 10, 1990           MRN: [id number]             PCP: Eliodoro Guerin, DO Referring: Eliodoro Guerin, DO Visit Date: 07/04/2023 Occupation: @GUAROCC @  Subjective:    History of Present Illness: Leon Mason is a 33 y.o. male with history of spondyloarthropathy. Patient remains on    CBC and CMP updated on 04/04/23 TB gold negative on 08/23/22  Activities of Daily Living:  Patient reports morning stiffness for *** {minute/hour:19697}.   Patient {ACTIONS;DENIES/REPORTS:21021675::"Denies"} nocturnal pain.  Difficulty dressing/grooming: {ACTIONS;DENIES/REPORTS:21021675::"Denies"} Difficulty climbing stairs: {ACTIONS;DENIES/REPORTS:21021675::"Denies"} Difficulty getting out of chair: {ACTIONS;DENIES/REPORTS:21021675::"Denies"} Difficulty using hands for taps, buttons, cutlery, and/or writing: {ACTIONS;DENIES/REPORTS:21021675::"Denies"}  No Rheumatology ROS completed.   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   Back pain without sciatica 02/16/2021   Neck pain 02/16/2021   Generalized joint pain 10/21/2020   Generalized abdominal pain 10/21/2020   Normocytic anemia 02/18/2019   Prolonged QT interval 02/18/2019   Globus sensation 07/04/2018   MDD (major depressive disorder), recurrent episode, severe (HCC) 03/28/2018   Tobacco use disorder 12/17/2016   Schizoaffective disorder, bipolar type (HCC) 08/23/2016   Alcohol use disorder, severe, dependence (HCC) 08/23/2016   Allergic rhinitis due to pollen 10/09/2015   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   Mild persistent allergic asthma 10/09/2015    Past Medical History:  Diagnosis Date   Anxiety    Asthma    Bipolar 1 disorder (HCC)    Hallucinations    Mild intermittent asthma with (acute) exacerbation 02/18/2019   Psychosis (HCC) 06/14/2013   Schizo-affective schizophrenia, chronic  condition with acute exacerbation (HCC) 03/28/2018   Schizoaffective disorder (HCC)    Schizophrenia (HCC)    SIRS (systemic inflammatory response syndrome) (HCC) 02/18/2019    Family History  Problem Relation Age of Onset   Bipolar disorder Mother    Alcoholism Mother    Diabetes Mother    Cervical cancer Mother    OCD Father    Bipolar disorder Father    Eczema Brother    Post-traumatic stress disorder Brother    COPD Maternal Aunt    Heart attack Maternal Grandmother    Breast cancer Maternal Grandmother    Heart attack Maternal Grandfather    Cancer Paternal Grandmother    Heart attack Paternal Grandfather    Asthma Neg Hx    Allergic rhinitis Neg Hx    Angioedema Neg Hx    Urticaria Neg Hx    Immunodeficiency Neg Hx    Atopy Neg Hx    Prostate cancer Neg Hx    Colon cancer Neg Hx    Past Surgical History:  Procedure Laterality Date   ingrown toenail Right 10/21/2022   great toe   WISDOM TOOTH EXTRACTION  2009   all four   Social History   Social History Narrative   Not on file   Immunization History  Administered Date(s) Administered   DTaP 01/03/1990, 03/17/1990, 06/08/1990, 02/20/1991, 06/01/1994   HIB (PRP-OMP) 01/03/1990, 03/17/1990, 06/08/1990, 02/20/1991   Hepatitis B 01/05/2001, 02/01/2001, 05/04/2005   Hepatitis B, PED/ADOLESCENT 01/05/2001, 02/01/2001, 05/04/2005   IPV 01/03/1990, 03/17/1990, 02/20/1991, 06/01/1994   Influenza, Seasonal, Injecte, Preservative Fre 12/28/2022   Influenza,inj,Quad PF,6+ Mos 02/20/2019, 02/16/2021   Influenza,inj,quad, With Preservative 03/18/2018   Influenza-Unspecified 03/18/2018  MMR 02/20/1991, 06/01/1994   Meningococcal Conjugate 05/04/2005   Pneumococcal Polysaccharide-23 08/29/2016, 02/20/2019   Tdap 05/04/2005, 12/26/2019     Objective: Vital Signs: There were no vitals taken for this visit.   Physical Exam Vitals and nursing note reviewed.  Constitutional:      Appearance: He is well-developed.   HENT:     Head: Normocephalic and atraumatic.  Eyes:     Conjunctiva/sclera: Conjunctivae normal.     Pupils: Pupils are equal, round, and reactive to light.  Cardiovascular:     Rate and Rhythm: Normal rate and regular rhythm.     Heart sounds: Normal heart sounds.  Pulmonary:     Effort: Pulmonary effort is normal.     Breath sounds: Normal breath sounds.  Abdominal:     General: Bowel sounds are normal.     Palpations: Abdomen is soft.  Musculoskeletal:     Cervical back: Normal range of motion and neck supple.  Skin:    General: Skin is warm and dry.     Capillary Refill: Capillary refill takes less than 2 seconds.  Neurological:     Mental Status: He is alert and oriented to person, place, and time.  Psychiatric:        Behavior: Behavior normal.      Musculoskeletal Exam: ***  CDAI Exam: CDAI Score: -- Patient Global: --; Provider Global: -- Swollen: --; Tender: -- Joint Exam 07/04/2023   No joint exam has been documented for this visit   There is currently no information documented on the homunculus. Go to the Rheumatology activity and complete the homunculus joint exam.  Investigation: No additional findings.  Imaging: No results found.  Recent Labs: Lab Results  Component Value Date   WBC 6.2 04/04/2023   HGB 15.9 04/04/2023   PLT 184 04/04/2023   NA 137 04/04/2023   K 4.4 04/04/2023   CL 102 04/04/2023   CO2 23 04/04/2023   GLUCOSE 87 04/04/2023   BUN 18 04/04/2023   CREATININE 0.82 04/04/2023   BILITOT 0.5 04/04/2023   ALKPHOS 69 09/22/2022   AST 29 04/04/2023   ALT 53 (H) 04/04/2023   PROT 8.0 04/04/2023   ALBUMIN 4.6 09/22/2022   CALCIUM 9.8 04/04/2023   GFRAA >60 02/19/2019   QFTBGOLDPLUS NEGATIVE 08/23/2022    Speciality Comments: Humira  started December 26, 2020  Procedures:  No procedures performed Allergies: Other, Dust mite extract, and Pollen extract   Assessment / Plan:     Visit Diagnoses: Spondyloarthropathy  High  risk medication use  Iritis  HLA B27 positive  Entrapment of left ulnar nerve  Chronic pain of left knee  DDD (degenerative disc disease), cervical  Pain in thoracic spine  Chronic SI joint pain  Other fatigue  Schizoaffective disorder, bipolar type (HCC)  SIRS (systemic inflammatory response syndrome) (HCC)  Prolonged QT interval  Palpitations  Mild persistent allergic asthma  Alcohol use disorder, severe, dependence (HCC)  Smoker  Allergic urticaria  Orders: No orders of the defined types were placed in this encounter.  No orders of the defined types were placed in this encounter.   Face-to-face time spent with patient was *** minutes. Greater than 50% of time was spent in counseling and coordination of care.  Follow-Up Instructions: No follow-ups on file.   Romayne Clubs, PA-C  Note - This record has been created using Dragon software.  Chart creation errors have been sought, but may not always  have been located. Such creation errors do not  reflect on  the standard of medical care.

## 2023-06-24 ENCOUNTER — Encounter: Payer: Self-pay | Admitting: Family Medicine

## 2023-06-28 ENCOUNTER — Encounter: Payer: Self-pay | Admitting: Nutrition

## 2023-06-28 ENCOUNTER — Encounter: Attending: Family Medicine | Admitting: Nutrition

## 2023-06-28 VITALS — Ht 74.0 in | Wt 310.0 lb

## 2023-06-28 DIAGNOSIS — E6609 Other obesity due to excess calories: Secondary | ICD-10-CM | POA: Diagnosis present

## 2023-06-28 NOTE — Patient Instructions (Addendum)
 Goals Ask MD to recheck cholesterol panel Eat three meals per day B)6-8 L)12-2 and D)5-7 Cut down on volume of tea at night to help empty bladder. Cut off devices by 10 pm. Keep exercising

## 2023-06-28 NOTE — Progress Notes (Signed)
 Medical Nutrition Therapy  Appointment Start time:  1330  Appointment End time:  1500  Primary concerns today: Obesity  Referral diagnosis: E66.9 Preferred learning style: No Preference  Learning readiness: ready   NUTRITION ASSESSMENT  33 yr old wmale referred for obesity. "I want to lose weight and get healthier.I am in the process of getting sober." Had 2 drinks this past week. Workign on gettting sober. He is working out 3-4 times per week. Has been working hard on eating healthier foods and trying to avoid sugar. His diet is very good with lots of fruits, vegetable and more whole grains. Limited finances due to currently job. Just got food stamps. H/o hyperlipidemia. Will see PCP in next month or so and may get it rechecked. NO recent thiamine  levels. Suppose to be on folic acid  but will start back taking them. Would benefit from getting thiamine  levels check with history of alcohol abuse. Is taking a MVI. Hadn't been taking thiamine . No VIT D levels to evaluate. Sees a theriapst and cousenlor and it has been helping with his mental health issues. He is motivated to work on Lifestyle Medicine to improve his health with his weight, hyperlipidemia and overall mental health.  Clinical Medical Hx:  Past Medical History:  Diagnosis Date   Anxiety    Asthma    Bipolar 1 disorder (HCC)    Hallucinations    Mild intermittent asthma with (acute) exacerbation 02/18/2019   Psychosis (HCC) 06/14/2013   Schizo-affective schizophrenia, chronic condition with acute exacerbation (HCC) 03/28/2018   Schizoaffective disorder (HCC)    Schizophrenia (HCC)    SIRS (systemic inflammatory response syndrome) (HCC) 02/18/2019    Medications:  Current Outpatient Medications on File Prior to Visit  Medication Sig Dispense Refill   Adalimumab -adaz (HYRIMOZ ) 40 MG/0.4ML SOAJ Inject 40 mg into the skin every 14 (fourteen) days. 6 mL 0   albuterol  (PROAIR  HFA) 108 (90 Base) MCG/ACT inhaler Inhale 2 puffs  into the lungs every 6 (six) hours as needed for shortness of breath. 6.7 g 0   cycloSPORINE (RESTASIS) 0.05 % ophthalmic emulsion 1 drop 2 (two) times daily.     fluticasone -salmeterol (ADVAIR ) 250-50 MCG/ACT AEPB Inhale 1 puff into the lungs daily.     Multiple Vitamin (MULTIVITAMIN PO) Take by mouth daily.     perphenazine  (TRILAFON ) 4 MG tablet Take 1 tablet (4 mg total) by mouth 2 (two) times daily. 180 tablet 1   sertraline  (ZOLOFT ) 50 MG tablet Take 1 tablet (50 mg total) by mouth daily. 90 tablet 1   diclofenac  Sodium (VOLTAREN ) 1 % GEL Apply 2-4 grams to affected joint 4 times daily as needed. 400 g 2   diphenhydrAMINE -Zinc Acetate (BENADRYL  EX) Apply topically as needed.     folic acid  (FOLVITE ) 1 MG tablet Take 1 tablet (1 mg total) by mouth daily. (Patient not taking: Reported on 06/28/2023) 90 tablet 1   IBUPROFEN  PO Take by mouth as needed.     thiamine  (VITAMIN B1) 100 MG tablet Take 1 tablet (100 mg total) by mouth daily. (Patient not taking: Reported on 06/28/2023) 90 tablet 1   triamcinolone  cream (KENALOG ) 0.1 % Apply 1 Application topically 2 (two) times daily as needed (poison oak/ivy rash (max 10 days per use)). 80 g 0   UNABLE TO FIND Med Name: Bug spray topically, daily use.     [DISCONTINUED] mometasone -formoterol  (DULERA ) 100-5 MCG/ACT AERO Inhale 2 puffs into the lungs 2 (two) times daily. For shortness of breath (Patient not taking: Reported  on 02/17/2019) 1 Inhaler 3   No current facility-administered medications on file prior to visit.     Labs:  This SmartLink has not been configured with any valid records.      Latest Ref Rng & Units 04/04/2023    1:44 PM 12/15/2022    3:10 PM 09/22/2022    3:20 PM  CMP  Glucose 65 - 99 mg/dL 87  77  86   BUN 7 - 25 mg/dL 18  17  15    Creatinine 0.60 - 1.26 mg/dL 8.11  9.14  7.82   Sodium 135 - 146 mmol/L 137  137  138   Potassium 3.5 - 5.3 mmol/L 4.4  3.7  4.3   Chloride 98 - 110 mmol/L 102  101  99   CO2 20 - 32 mmol/L  23  26  23    Calcium 8.6 - 10.3 mg/dL 9.8  9.8  9.7   Total Protein 6.1 - 8.1 g/dL 8.0  7.8  7.6   Total Bilirubin 0.2 - 1.2 mg/dL 0.5  0.5  0.4   Alkaline Phos 44 - 121 IU/L   69   AST 10 - 40 U/L 29  41  41   ALT 9 - 46 U/L 53  64  54    Lipid Panel     Component Value Date/Time   CHOL 248 (H) 05/06/2021 1357   TRIG 205 (H) 05/06/2021 1357   HDL 36 (L) 05/06/2021 1357   CHOLHDL 6.9 (H) 05/06/2021 1357   CHOLHDL 6.3 03/30/2018 0636   VLDL 39 03/30/2018 0636   LDLCALC 173 (H) 05/06/2021 1357   LABVLDL 39 05/06/2021 1357    Notable Signs/Symptoms: None  Lifestyle & Dietary Hx LIves by himself. Works for Data processing manager.   Estimated daily fluid intake: 120 oz Supplements: MVI Sleep: poor--has insomnia Stress / self-care: working with therpiast and psychiartrist Current average weekly physical activity:  Going to GYM 3 times per week. Working out for 1-2 hrs at a time  24-Hr Dietary Recall  First Meal: 8 am coffee, protein shake 25 grams of protein with milk,  10 am 4 strips of bacon, 3 eggs, salt/seasonings, toast daves 21 bread 3 slices, blueberreis 1 cup, banana, 3 cloves of garlic, pickle Second Meal: sometimes 1/2 lb [place] deli meat, water Snack:  Third Meal:  9 pm Chicken breast, misc vegetables, -kale, eggplant, celery, garlic, tomatoes, carrots, mushrooms, onions, squash.zucchini, unsalted butter 2 TBS, herbs. Hot peppers. Snack: Rootbeer occassionally - frostys every other day.  Beverages: water- gallon 3 tbs apple cider vinegar. Lavendar tea,  Estimated Energy Needs Calories: 2000 Carbohydrate: 225g Protein: 150g Fat: 56g   NUTRITION DIAGNOSIS  NI-1.7 Predicted excessive energy intake As related to High calorie diet with history of alcohol abuse.  As evidenced by BMI 39 and history of drinking 80-100 oz of alcohol per day in the past..   NUTRITION INTERVENTION  Nutrition education (E-1) on the following topics:   Nutrition and Alcoholism and nutritional  needs.   Lifestyle Medicine  - Whole Food, Plant Predominant Nutrition is highly recommended: Eat Plenty of vegetables, Mushrooms, fruits, Legumes, Whole Grains, Nuts, seeds in lieu of processed meats, processed snacks/pastries red meat, poultry, eggs.    -It is better to avoid simple carbohydrates including: Cakes, Sweet Desserts, Ice Cream, Soda (diet and regular), Sweet Tea, Candies, Chips, Cookies, Store Bought Juices, Alcohol in Excess of  1-2 drinks a day, Lemonade,  Artificial Sweeteners, Doughnuts, Coffee Creamers, "Sugar-free" Products, etc, etc.  This is not a complete list.....  Exercise: If you are able: 30 -60 minutes a day ,4 days a week, or 150 minutes a week.  The longer the better.  Combine stretch, strength, and aerobic activities.  If you were told in the past that you have high risk for cardiovascular diseases, you may seek evaluation by your heart doctor prior to initiating moderate to intense exercise programs.   Handouts Provided Include  Lifestyle Medicine handouts  Learning Style & Readiness for Change Teaching method utilized: Visual & Auditory  Demonstrated degree of understanding via: Teach Back  Barriers to learning/adherence to lifestyle change: none  Goals Established by Pt Goals Ask MD to recheck cholesterol panel Eat three meals per day B)6-8 L)12-2 and D)5-7 Cut down on volume of tea at night to help empty bladder. Cut off devices by 10 pm. Keep exercising   MONITORING & EVALUATION Dietary intake, weekly physical activity, and  weight in  1 month.  Next Steps  Patient is to work on whole plant based foods and exercise with meal prepping.Leon Mason

## 2023-07-04 ENCOUNTER — Ambulatory Visit: Payer: 59 | Admitting: Physician Assistant

## 2023-07-04 ENCOUNTER — Other Ambulatory Visit: Payer: Self-pay | Admitting: Physician Assistant

## 2023-07-04 DIAGNOSIS — F17200 Nicotine dependence, unspecified, uncomplicated: Secondary | ICD-10-CM

## 2023-07-04 DIAGNOSIS — R9431 Abnormal electrocardiogram [ECG] [EKG]: Secondary | ICD-10-CM

## 2023-07-04 DIAGNOSIS — M5030 Other cervical disc degeneration, unspecified cervical region: Secondary | ICD-10-CM

## 2023-07-04 DIAGNOSIS — Z79899 Other long term (current) drug therapy: Secondary | ICD-10-CM

## 2023-07-04 DIAGNOSIS — Z1589 Genetic susceptibility to other disease: Secondary | ICD-10-CM

## 2023-07-04 DIAGNOSIS — M47819 Spondylosis without myelopathy or radiculopathy, site unspecified: Secondary | ICD-10-CM

## 2023-07-04 DIAGNOSIS — R002 Palpitations: Secondary | ICD-10-CM

## 2023-07-04 DIAGNOSIS — G8929 Other chronic pain: Secondary | ICD-10-CM

## 2023-07-04 DIAGNOSIS — F1020 Alcohol dependence, uncomplicated: Secondary | ICD-10-CM

## 2023-07-04 DIAGNOSIS — H209 Unspecified iridocyclitis: Secondary | ICD-10-CM

## 2023-07-04 DIAGNOSIS — R6510 Systemic inflammatory response syndrome (SIRS) of non-infectious origin without acute organ dysfunction: Secondary | ICD-10-CM

## 2023-07-04 DIAGNOSIS — L500 Allergic urticaria: Secondary | ICD-10-CM

## 2023-07-04 DIAGNOSIS — R5383 Other fatigue: Secondary | ICD-10-CM

## 2023-07-04 DIAGNOSIS — M546 Pain in thoracic spine: Secondary | ICD-10-CM

## 2023-07-04 DIAGNOSIS — G5622 Lesion of ulnar nerve, left upper limb: Secondary | ICD-10-CM

## 2023-07-04 DIAGNOSIS — F250 Schizoaffective disorder, bipolar type: Secondary | ICD-10-CM

## 2023-07-04 DIAGNOSIS — J4530 Mild persistent asthma, uncomplicated: Secondary | ICD-10-CM

## 2023-07-05 NOTE — Telephone Encounter (Signed)
 Last Fill: 04/14/2023  Labs: 04/04/2023 ALT remains slightly elevated but has improved. AST is now WNL. Rest of CMP WNL. CBC WNL  TB Gold: 08/23/2022 Neg    Next Visit: Due April 2025. Message sent to the front to schedule.   Last Visit: 04/04/2023  [id number]   Current Dose per office note 04/04/2023: Hyrimoz  40 mg sq injections every 14 days.   Okay to refill Hyrimoz ?

## 2023-07-05 NOTE — Telephone Encounter (Signed)
 Please schedule patient a follow up visit. Patient due April 2025. Thanks!

## 2023-07-11 ENCOUNTER — Ambulatory Visit (INDEPENDENT_AMBULATORY_CARE_PROVIDER_SITE_OTHER): Admitting: Psychiatry

## 2023-07-11 DIAGNOSIS — F250 Schizoaffective disorder, bipolar type: Secondary | ICD-10-CM | POA: Diagnosis not present

## 2023-07-11 DIAGNOSIS — F411 Generalized anxiety disorder: Secondary | ICD-10-CM | POA: Diagnosis not present

## 2023-07-11 DIAGNOSIS — Z636 Dependent relative needing care at home: Secondary | ICD-10-CM

## 2023-07-11 DIAGNOSIS — Z638 Other specified problems related to primary support group: Secondary | ICD-10-CM | POA: Diagnosis not present

## 2023-07-11 DIAGNOSIS — F1020 Alcohol dependence, uncomplicated: Secondary | ICD-10-CM | POA: Diagnosis not present

## 2023-07-11 NOTE — Progress Notes (Signed)
 Psychotherapy Progress Note [facility] Delora Ferry, PhD LP  Patient ID: Leon Mason [facility] "Leon Mason    MRN: [id number] Therapy format: Individual psychotherapy Date: 07/11/2023      Start: 2:18p     Stop: 3:08p     Time Spent: 50 min Location: In-person   Session narrative (presenting needs, interim history, self-report of stressors and symptoms, applications of prior therapy, status changes, and interventions made in session) Apologetic for missing last time and for running late today.  Has joined a Psychologist, prison and probation services group now, and an Conservator, museum/gallery, also has a Sunday spiritual group.  Martial arts did not work out.  Now owns 2 pistols, sent to him for free (?!) by a Reddit contact.  Has not had training, and cannot afford ammunition, but wants   Finances tight, getting behind.  And sexually frustrated, despite masturbation.  Feels a more acute need to be tangibly loved and wanted, and unattached now.  Not despairing but alienated, dissatisfied.  Still in apartment, now considering offering to his brother to move in with him, since it looks again like Marlan Silva needs to be rescued from the morass of dysfunctional dynamics there, and a roommate's payment would help.  For her part, news that mother now extorted from Marlan Silva a trip to the store to et her alcohol,. When she had declared herself sober.  [name]'s car was repo'd a few years ago, and he still gets notices offering to settle $8K debt for a little as $400.  Wants to do that, but still hard to get that much together.  Referred for detailed financial guidance to his own bank for credit counseling, and failing that to [facility] credit clinic.  Failing that, we can focus on detailed money decisions on an educated amateur basis, but more economical if he can use those hopefully free or low-cost resources.  Re alcohol, he has decisively cut back.  Affirmed and encouraged.  Nutritionist informed him about how poisonous  alcohol actually is, and he got himself down to 2 drunks last week.  Last night indulged again, found he drunk texted a girl he's interested in with something lewd and is ashamed today.  Really just wants male approval, but admits that much would be good with a friend, too.  Brainstormed possibilities for improving friendship and letting the sexual quest be separate -- noted friend opportunities among the social circles he is in (like people he regularly sees [employer] at [employer]) and potentially Al-Anon, which would be very appropriate to his family experience, and potentially enough to bolster him against his own drinking habit.  Ideas also for solitary enjoyments like night fishing, which he could do sober, and better to see if he can soothe himself without drinking.  Affirmed and encouraged.  Therapeutic modalities: Cognitive Behavioral Therapy, Solution-Oriented/Positive Psychology, and Ego-Supportive  Mental Status/Observations:  Appearance:   Casual     Behavior:  Appropriate  Motor:  Normal  Speech/Language:   Clear and Coherent  Affect:  Appropriate  Mood:  dysthymic and better  Thought process:  normal  Thought content:    WNL  Sensory/Perceptual disturbances:    WNL  Orientation:  Fully oriented  Attention:  Good    Concentration:  Good  Memory:  WNL  Insight:    Good  Judgment:   Good  Impulse Control:  Good   Risk Assessment: Danger to Self: No Self-injurious Behavior: No Danger to Others: No Physical Aggression / Violence: No Duty to Warn:  No Access to Firearms a concern: No  Assessment of progress:  progressing  Diagnosis:   ICD-10-CM   1. Schizoaffective disorder, bipolar type (HCC)  F25.0     2. Generalized anxiety disorder  F41.1     3. Alcohol use disorder, moderate, dependence (HCC)  F10.20     4. Caregiver stress  Z63.6     5. Relationship problems with multiple family members  Z63.8      Plan:  Sobriety -- Seek abstinence for health and life,  self-reminding how alcohol is more poisonous for his conditions.  Prioritize arranging non-intoxicating activities (solo or social) for relief and soothing without drinking.  Check [email or web address] for possible interests he might not think of. Work -- Programme researcher, broadcasting/film/video work best able, pursue better as health allows.  Previous assessment that he qualifies as disabled, but no opinion at present.  In favor of working if able. Health care -- Follow medical advice for pre-cirrhosis and ankylosing spondylitis, especially by refraining from alcohol and holding down pro-inflammatory intake. Family stability and emergency welfare -- Likely benefit of ACTT team for mother's household; may ask DSS or the VA (in [name]) to provide active in-home services.  Contact [place] Adult Protective Services if anything rises to the level of neglect, abuse, or inability to manage health among the three in the household.  Mobile crisis service or law enforcement as needed if domestic dispute intensifies.  Address coverable DME needs for Gaila Josephs to her VA case worker, if identifiable.  If charitable sources for furniture, affordable cleaning, or other needs appropriate to elder, disabled, veteran are needed, check with DSS or VA as appropriate.  Continuing recommendations to household for mutual sobriety, and engage 12-step support or affordable counseling.  Meanwhile, enduring mental health is best served by not living with mother and continuing in his own space.  Option to room with brother Marlan Silva can be constructive, provided they cleanly establish boundaries about privacy, non-annoyance, and roles and responsibilities, and provided Marlan Silva can be trusted not to press grudges from their otherwise settled abuse history. Dating/relationship -- Open, honest communication about sex and interests, categorically respect limits.  Watch any tendency to build all hopes around any particular relationship. Other recommendations/advice -- As may be  noted above.  Continue to utilize previously learned skills ad lib. Medication compliance -- Maintain medication as prescribed and work faithfully with relevant prescriber(s) if any changes are desired or seem indicated. Crisis service -- Aware of call list and work-in appts.  Call the clinic on-call service, 988/hotline, 911, or present to [facility] or ER if any life-threatening psychiatric crisis. Followup -- Return for time as already scheduled.  Next scheduled visit with me 08/10/2023.  Next scheduled in this office 08/10/2023.  Maretta Shaper, PhD Delora Ferry, PhD LP Clinical Psychologist, [facility] [address] [place], [place] [address] [phone]

## 2023-07-13 ENCOUNTER — Ambulatory Visit: Payer: 59 | Admitting: Psychiatry

## 2023-07-17 NOTE — Progress Notes (Deleted)
 Office Visit Note  Patient: Leon Mason             Date of Birth: 06-25-1990           MRN: [id number]             PCP: Eliodoro Guerin, DO Referring: Eliodoro Guerin, DO Visit Date: 07/28/2023 Occupation: @GUAROCC @  Subjective:    History of Present Illness: Leon Mason is a 33 y.o. male with history of spondyloarthropathy.  Patient is currently on Hyrimoz  40 mg sq injections every 14 days.   CBC and CMP updated on 04/04/23.   TB gold negative on 08/23/22 Discussed the importance of holding Hyrimoz  if he develops signs or symptoms of an infection and to resume once the infection has completely cleared.    Activities of Daily Living:  Patient reports morning stiffness for *** {minute/hour:19697}.   Patient {ACTIONS;DENIES/REPORTS:21021675::"Denies"} nocturnal pain.  Difficulty dressing/grooming: {ACTIONS;DENIES/REPORTS:21021675::"Denies"} Difficulty climbing stairs: {ACTIONS;DENIES/REPORTS:21021675::"Denies"} Difficulty getting out of chair: {ACTIONS;DENIES/REPORTS:21021675::"Denies"} Difficulty using hands for taps, buttons, cutlery, and/or writing: {ACTIONS;DENIES/REPORTS:21021675::"Denies"}  No Rheumatology ROS completed.   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   Back pain without sciatica 02/16/2021   Neck pain 02/16/2021   Generalized joint pain 10/21/2020   Generalized abdominal pain 10/21/2020   Normocytic anemia 02/18/2019   Prolonged QT interval 02/18/2019   Globus sensation 07/04/2018   MDD (major depressive disorder), recurrent episode, severe (HCC) 03/28/2018   Tobacco use disorder 12/17/2016   Schizoaffective disorder, bipolar type (HCC) 08/23/2016   Alcohol use disorder, severe, dependence (HCC) 08/23/2016   Allergic rhinitis due to pollen 10/09/2015   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   Mild persistent allergic asthma 10/09/2015    Past Medical History:  Diagnosis Date   Anxiety     Asthma    Bipolar 1 disorder (HCC)    Hallucinations    Mild intermittent asthma with (acute) exacerbation 02/18/2019   Psychosis (HCC) 06/14/2013   Schizo-affective schizophrenia, chronic condition with acute exacerbation (HCC) 03/28/2018   Schizoaffective disorder (HCC)    Schizophrenia (HCC)    SIRS (systemic inflammatory response syndrome) (HCC) 02/18/2019    Family History  Problem Relation Age of Onset   Bipolar disorder Mother    Alcoholism Mother    Diabetes Mother    Cervical cancer Mother    OCD Father    Bipolar disorder Father    Eczema Brother    Post-traumatic stress disorder Brother    COPD Maternal Aunt    Heart attack Maternal Grandmother    Breast cancer Maternal Grandmother    Heart attack Maternal Grandfather    Cancer Paternal Grandmother    Heart attack Paternal Grandfather    Asthma Neg Hx    Allergic rhinitis Neg Hx    Angioedema Neg Hx    Urticaria Neg Hx    Immunodeficiency Neg Hx    Atopy Neg Hx    Prostate cancer Neg Hx    Colon cancer Neg Hx    Past Surgical History:  Procedure Laterality Date   ingrown toenail Right 10/21/2022   great toe   WISDOM TOOTH EXTRACTION  2009   all four   Social History   Social History Narrative   Not on file   Immunization History  Administered Date(s) Administered   DTaP 01/03/1990, 03/17/1990, 06/08/1990, 02/20/1991, 06/01/1994   HIB (PRP-OMP) 01/03/1990, 03/17/1990, 06/08/1990, 02/20/1991   Hepatitis B 01/05/2001, 02/01/2001, 05/04/2005   Hepatitis B, PED/ADOLESCENT  01/05/2001, 02/01/2001, 05/04/2005   IPV 01/03/1990, 03/17/1990, 02/20/1991, 06/01/1994   Influenza, Seasonal, Injecte, Preservative Fre 12/28/2022   Influenza,inj,Quad PF,6+ Mos 02/20/2019, 02/16/2021   Influenza,inj,quad, With Preservative 03/18/2018   Influenza-Unspecified 03/18/2018   MMR 02/20/1991, 06/01/1994   Meningococcal Conjugate 05/04/2005   Pneumococcal Polysaccharide-23 08/29/2016, 02/20/2019   Tdap 05/04/2005,  12/26/2019     Objective: Vital Signs: There were no vitals taken for this visit.   Physical Exam Vitals and nursing note reviewed.  Constitutional:      Appearance: He is well-developed.  HENT:     Head: Normocephalic and atraumatic.  Eyes:     Conjunctiva/sclera: Conjunctivae normal.     Pupils: Pupils are equal, round, and reactive to light.  Cardiovascular:     Rate and Rhythm: Normal rate and regular rhythm.     Heart sounds: Normal heart sounds.  Pulmonary:     Effort: Pulmonary effort is normal.     Breath sounds: Normal breath sounds.  Abdominal:     General: Bowel sounds are normal.     Palpations: Abdomen is soft.  Musculoskeletal:     Cervical back: Normal range of motion and neck supple.  Skin:    General: Skin is warm and dry.     Capillary Refill: Capillary refill takes less than 2 seconds.  Neurological:     Mental Status: He is alert and oriented to person, place, and time.  Psychiatric:        Behavior: Behavior normal.      Musculoskeletal Exam: ***  CDAI Exam: CDAI Score: -- Patient Global: --; Provider Global: -- Swollen: --; Tender: -- Joint Exam 07/28/2023   No joint exam has been documented for this visit   There is currently no information documented on the homunculus. Go to the Rheumatology activity and complete the homunculus joint exam.  Investigation: No additional findings.  Imaging: No results found.  Recent Labs: Lab Results  Component Value Date   WBC 6.2 04/04/2023   HGB 15.9 04/04/2023   PLT 184 04/04/2023   NA 137 04/04/2023   K 4.4 04/04/2023   CL 102 04/04/2023   CO2 23 04/04/2023   GLUCOSE 87 04/04/2023   BUN 18 04/04/2023   CREATININE 0.82 04/04/2023   BILITOT 0.5 04/04/2023   ALKPHOS 69 09/22/2022   AST 29 04/04/2023   ALT 53 (H) 04/04/2023   PROT 8.0 04/04/2023   ALBUMIN 4.6 09/22/2022   CALCIUM 9.8 04/04/2023   GFRAA >60 02/19/2019   QFTBGOLDPLUS NEGATIVE 08/23/2022    Speciality Comments: Humira   started December 26, 2020  Procedures:  No procedures performed Allergies: Other, Dust mite extract, and Pollen extract   Assessment / Plan:     Visit Diagnoses: Spondyloarthropathy  Iritis  High risk medication use  HLA B27 positive  Entrapment of left ulnar nerve  Chronic pain of left knee  DDD (degenerative disc disease), cervical  Pain in thoracic spine  Chronic SI joint pain  Other fatigue  Schizoaffective disorder, bipolar type (HCC)  SIRS (systemic inflammatory response syndrome) (HCC)  Prolonged QT interval  Mild persistent allergic asthma  Alcohol use disorder, severe, dependence (HCC)  Smoker  Allergic urticaria  Orders: No orders of the defined types were placed in this encounter.  No orders of the defined types were placed in this encounter.   Face-to-face time spent with patient was *** minutes. Greater than 50% of time was spent in counseling and coordination of care.  Follow-Up Instructions: No follow-ups on file.  Romayne Clubs, PA-C  Note - This record has been created using Dragon software.  Chart creation errors have been sought, but may not always  have been located. Such creation errors do not reflect on  the standard of medical care.

## 2023-07-27 ENCOUNTER — Ambulatory Visit: Admitting: Nutrition

## 2023-07-27 ENCOUNTER — Telehealth: Payer: Self-pay | Admitting: Nutrition

## 2023-07-27 NOTE — Telephone Encounter (Signed)
 Tc due to no show. Vm left to call back to r/s missed appt.

## 2023-07-28 ENCOUNTER — Ambulatory Visit: Admitting: Physician Assistant

## 2023-07-28 DIAGNOSIS — G5622 Lesion of ulnar nerve, left upper limb: Secondary | ICD-10-CM

## 2023-07-28 DIAGNOSIS — M5030 Other cervical disc degeneration, unspecified cervical region: Secondary | ICD-10-CM

## 2023-07-28 DIAGNOSIS — G8929 Other chronic pain: Secondary | ICD-10-CM

## 2023-07-28 DIAGNOSIS — M47819 Spondylosis without myelopathy or radiculopathy, site unspecified: Secondary | ICD-10-CM

## 2023-07-28 DIAGNOSIS — R9431 Abnormal electrocardiogram [ECG] [EKG]: Secondary | ICD-10-CM

## 2023-07-28 DIAGNOSIS — F250 Schizoaffective disorder, bipolar type: Secondary | ICD-10-CM

## 2023-07-28 DIAGNOSIS — L500 Allergic urticaria: Secondary | ICD-10-CM

## 2023-07-28 DIAGNOSIS — M546 Pain in thoracic spine: Secondary | ICD-10-CM

## 2023-07-28 DIAGNOSIS — R5383 Other fatigue: Secondary | ICD-10-CM

## 2023-07-28 DIAGNOSIS — Z1589 Genetic susceptibility to other disease: Secondary | ICD-10-CM

## 2023-07-28 DIAGNOSIS — R6510 Systemic inflammatory response syndrome (SIRS) of non-infectious origin without acute organ dysfunction: Secondary | ICD-10-CM

## 2023-07-28 DIAGNOSIS — J4530 Mild persistent asthma, uncomplicated: Secondary | ICD-10-CM

## 2023-07-28 DIAGNOSIS — F1020 Alcohol dependence, uncomplicated: Secondary | ICD-10-CM

## 2023-07-28 DIAGNOSIS — H209 Unspecified iridocyclitis: Secondary | ICD-10-CM

## 2023-07-28 DIAGNOSIS — F17200 Nicotine dependence, unspecified, uncomplicated: Secondary | ICD-10-CM

## 2023-07-28 DIAGNOSIS — Z79899 Other long term (current) drug therapy: Secondary | ICD-10-CM

## 2023-08-04 ENCOUNTER — Other Ambulatory Visit: Payer: Self-pay | Admitting: Rheumatology

## 2023-08-04 DIAGNOSIS — M47819 Spondylosis without myelopathy or radiculopathy, site unspecified: Secondary | ICD-10-CM

## 2023-08-04 DIAGNOSIS — H209 Unspecified iridocyclitis: Secondary | ICD-10-CM

## 2023-08-04 DIAGNOSIS — Z79899 Other long term (current) drug therapy: Secondary | ICD-10-CM

## 2023-08-05 DIAGNOSIS — R070 Pain in throat: Secondary | ICD-10-CM | POA: Diagnosis not present

## 2023-08-05 DIAGNOSIS — B349 Viral infection, unspecified: Secondary | ICD-10-CM | POA: Diagnosis not present

## 2023-08-05 DIAGNOSIS — Z20822 Contact with and (suspected) exposure to covid-19: Secondary | ICD-10-CM | POA: Diagnosis not present

## 2023-08-05 NOTE — Progress Notes (Deleted)
 Office Visit Note  Patient: Leon Mason             Date of Birth: 1991-02-23           MRN: [id number]             PCP: Eliodoro Guerin, DO Referring: Eliodoro Guerin, DO Visit Date: 08/15/2023 Occupation: @GUAROCC @  Subjective:    History of Present Illness: Leon Mason is a 32 y.o. male with history of spondyloarthropathy and iritis.  Patient is currently on Hyrimoz  40 mg sq injections every 14 days.   CBC and CMP updated on 04/04/23.  Orders for CBC and CMP released today.  TB gold negative on 08/23/22 Discussed the importance of holding Hyrimoz  if he develops signs or symptoms of an infection and to resume once the infection has completely cleared.    Activities of Daily Living:  Patient reports morning stiffness for *** {minute/hour:19697}.   Patient {ACTIONS;DENIES/REPORTS:21021675::"Denies"} nocturnal pain.  Difficulty dressing/grooming: {ACTIONS;DENIES/REPORTS:21021675::"Denies"} Difficulty climbing stairs: {ACTIONS;DENIES/REPORTS:21021675::"Denies"} Difficulty getting out of chair: {ACTIONS;DENIES/REPORTS:21021675::"Denies"} Difficulty using hands for taps, buttons, cutlery, and/or writing: {ACTIONS;DENIES/REPORTS:21021675::"Denies"}  No Rheumatology ROS completed.   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   Back pain without sciatica 02/16/2021   Neck pain 02/16/2021   Generalized joint pain 10/21/2020   Generalized abdominal pain 10/21/2020   Normocytic anemia 02/18/2019   Prolonged QT interval 02/18/2019   Globus sensation 07/04/2018   MDD (major depressive disorder), recurrent episode, severe (HCC) 03/28/2018   Tobacco use disorder 12/17/2016   Schizoaffective disorder, bipolar type (HCC) 08/23/2016   Alcohol use disorder, severe, dependence (HCC) 08/23/2016   Allergic rhinitis due to pollen 10/09/2015   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   Mild persistent allergic asthma 10/09/2015     Past Medical History:  Diagnosis Date   Anxiety    Asthma    Bipolar 1 disorder (HCC)    Hallucinations    Mild intermittent asthma with (acute) exacerbation 02/18/2019   Psychosis (HCC) 06/14/2013   Schizo-affective schizophrenia, chronic condition with acute exacerbation (HCC) 03/28/2018   Schizoaffective disorder (HCC)    Schizophrenia (HCC)    SIRS (systemic inflammatory response syndrome) (HCC) 02/18/2019    Family History  Problem Relation Age of Onset   Bipolar disorder Mother    Alcoholism Mother    Diabetes Mother    Cervical cancer Mother    OCD Father    Bipolar disorder Father    Eczema Brother    Post-traumatic stress disorder Brother    COPD Maternal Aunt    Heart attack Maternal Grandmother    Breast cancer Maternal Grandmother    Heart attack Maternal Grandfather    Cancer Paternal Grandmother    Heart attack Paternal Grandfather    Asthma Neg Hx    Allergic rhinitis Neg Hx    Angioedema Neg Hx    Urticaria Neg Hx    Immunodeficiency Neg Hx    Atopy Neg Hx    Prostate cancer Neg Hx    Colon cancer Neg Hx    Past Surgical History:  Procedure Laterality Date   ingrown toenail Right 10/21/2022   great toe   WISDOM TOOTH EXTRACTION  2009   all four   Social History   Social History Narrative   Not on file   Immunization History  Administered Date(s) Administered   DTaP 01/03/1990, 03/17/1990, 06/08/1990, 02/20/1991, 06/01/1994   HIB (PRP-OMP) 01/03/1990, 03/17/1990, 06/08/1990, 02/20/1991   Hepatitis  B 01/05/2001, 02/01/2001, 05/04/2005   Hepatitis B, PED/ADOLESCENT 01/05/2001, 02/01/2001, 05/04/2005   IPV 01/03/1990, 03/17/1990, 02/20/1991, 06/01/1994   Influenza, Seasonal, Injecte, Preservative Fre 12/28/2022   Influenza,inj,Quad PF,6+ Mos 02/20/2019, 02/16/2021   Influenza,inj,quad, With Preservative 03/18/2018   Influenza-Unspecified 03/18/2018   MMR 02/20/1991, 06/01/1994   Meningococcal Conjugate 05/04/2005   Pneumococcal  Polysaccharide-23 08/29/2016, 02/20/2019   Tdap 05/04/2005, 12/26/2019     Objective: Vital Signs: There were no vitals taken for this visit.   Physical Exam Vitals and nursing note reviewed.  Constitutional:      Appearance: He is well-developed.  HENT:     Head: Normocephalic and atraumatic.  Eyes:     Conjunctiva/sclera: Conjunctivae normal.     Pupils: Pupils are equal, round, and reactive to light.  Cardiovascular:     Rate and Rhythm: Normal rate and regular rhythm.     Heart sounds: Normal heart sounds.  Pulmonary:     Effort: Pulmonary effort is normal.     Breath sounds: Normal breath sounds.  Abdominal:     General: Bowel sounds are normal.     Palpations: Abdomen is soft.  Musculoskeletal:     Cervical back: Normal range of motion and neck supple.  Skin:    General: Skin is warm and dry.     Capillary Refill: Capillary refill takes less than 2 seconds.  Neurological:     Mental Status: He is alert and oriented to person, place, and time.  Psychiatric:        Behavior: Behavior normal.     Musculoskeletal Exam: ***  CDAI Exam: CDAI Score: -- Patient Global: --; Provider Global: -- Swollen: --; Tender: -- Joint Exam 08/15/2023   No joint exam has been documented for this visit   There is currently no information documented on the homunculus. Go to the Rheumatology activity and complete the homunculus joint exam.  Investigation: No additional findings.  Imaging: No results found.  Recent Labs: Lab Results  Component Value Date   WBC 6.2 04/04/2023   HGB 15.9 04/04/2023   PLT 184 04/04/2023   NA 137 04/04/2023   K 4.4 04/04/2023   CL 102 04/04/2023   CO2 23 04/04/2023   GLUCOSE 87 04/04/2023   BUN 18 04/04/2023   CREATININE 0.82 04/04/2023   BILITOT 0.5 04/04/2023   ALKPHOS 69 09/22/2022   AST 29 04/04/2023   ALT 53 (H) 04/04/2023   PROT 8.0 04/04/2023   ALBUMIN 4.6 09/22/2022   CALCIUM 9.8 04/04/2023   GFRAA >60 02/19/2019    QFTBGOLDPLUS NEGATIVE 08/23/2022    Speciality Comments: Humira  started December 26, 2020  Procedures:  No procedures performed Allergies: Other, Dust mite extract, and Pollen extract   Assessment / Plan:     Visit Diagnoses: Spondyloarthropathy  High risk medication use  HLA B27 positive  Iritis  Entrapment of left ulnar nerve  Chronic pain of left knee  DDD (degenerative disc disease), cervical  Pain in thoracic spine  Chronic SI joint pain  Other fatigue  Schizoaffective disorder, bipolar type (HCC)  SIRS (systemic inflammatory response syndrome) (HCC)  Prolonged QT interval  Palpitations  Mild persistent allergic asthma  Alcohol use disorder, severe, dependence (HCC)  Smoker  Allergic urticaria  Orders: No orders of the defined types were placed in this encounter.  No orders of the defined types were placed in this encounter.   Face-to-face time spent with patient was *** minutes. Greater than 50% of time was spent in counseling and coordination of  care.  Follow-Up Instructions: No follow-ups on file.   Romayne Clubs, PA-C  Note - This record has been created using Dragon software.  Chart creation errors have been sought, but may not always  have been located. Such creation errors do not reflect on  the standard of medical care.

## 2023-08-09 ENCOUNTER — Other Ambulatory Visit (HOSPITAL_COMMUNITY)
Admission: RE | Admit: 2023-08-09 | Discharge: 2023-08-09 | Disposition: A | Discharge status: Home / Self Care | Source: Ambulatory Visit | Attending: Family Medicine | Admitting: Family Medicine

## 2023-08-09 ENCOUNTER — Other Ambulatory Visit: Payer: Self-pay | Admitting: Family Medicine

## 2023-08-09 ENCOUNTER — Ambulatory Visit: Admitting: Family Medicine

## 2023-08-09 ENCOUNTER — Ambulatory Visit: Payer: Self-pay | Admitting: Family Medicine

## 2023-08-09 ENCOUNTER — Ambulatory Visit (INDEPENDENT_AMBULATORY_CARE_PROVIDER_SITE_OTHER)

## 2023-08-09 ENCOUNTER — Encounter: Payer: Self-pay | Admitting: Family Medicine

## 2023-08-09 VITALS — BP 119/76 | HR 80 | Temp 98.7°F | Ht 74.0 in | Wt 303.6 lb

## 2023-08-09 DIAGNOSIS — F1020 Alcohol dependence, uncomplicated: Secondary | ICD-10-CM

## 2023-08-09 DIAGNOSIS — H209 Unspecified iridocyclitis: Secondary | ICD-10-CM

## 2023-08-09 DIAGNOSIS — J208 Acute bronchitis due to other specified organisms: Secondary | ICD-10-CM | POA: Diagnosis not present

## 2023-08-09 DIAGNOSIS — B9689 Other specified bacterial agents as the cause of diseases classified elsewhere: Secondary | ICD-10-CM | POA: Diagnosis not present

## 2023-08-09 DIAGNOSIS — R0989 Other specified symptoms and signs involving the circulatory and respiratory systems: Secondary | ICD-10-CM | POA: Diagnosis not present

## 2023-08-09 DIAGNOSIS — H6691 Otitis media, unspecified, right ear: Secondary | ICD-10-CM | POA: Diagnosis not present

## 2023-08-09 DIAGNOSIS — J029 Acute pharyngitis, unspecified: Secondary | ICD-10-CM | POA: Insufficient documentation

## 2023-08-09 DIAGNOSIS — Z6841 Body Mass Index (BMI) 40.0 and over, adult: Secondary | ICD-10-CM

## 2023-08-09 DIAGNOSIS — Z113 Encounter for screening for infections with a predominantly sexual mode of transmission: Secondary | ICD-10-CM

## 2023-08-09 DIAGNOSIS — M47819 Spondylosis without myelopathy or radiculopathy, site unspecified: Secondary | ICD-10-CM

## 2023-08-09 DIAGNOSIS — R0602 Shortness of breath: Secondary | ICD-10-CM

## 2023-08-09 DIAGNOSIS — Z79899 Other long term (current) drug therapy: Secondary | ICD-10-CM

## 2023-08-09 DIAGNOSIS — Z116 Encounter for screening for other protozoal diseases and helminthiases: Secondary | ICD-10-CM | POA: Diagnosis not present

## 2023-08-09 LAB — LIPID PANEL

## 2023-08-09 MED ORDER — CEFDINIR 300 MG PO CAPS
300.0000 mg | ORAL_CAPSULE | Freq: Two times a day (BID) | ORAL | 0 refills | Status: AC
Start: 2023-08-09 — End: ?

## 2023-08-09 MED ORDER — LEVOCETIRIZINE DIHYDROCHLORIDE 5 MG PO TABS: 5.0000 mg | ORAL_TABLET | Freq: Every evening | ORAL | 3 refills | Status: AC | PRN

## 2023-08-09 MED ORDER — MAGIC MOUTHWASH: ORAL | 0 refills | Status: DC

## 2023-08-09 MED ORDER — BENZONATATE 100 MG PO CAPS: 200.0000 mg | ORAL_CAPSULE | Freq: Three times a day (TID) | ORAL | 0 refills | Status: DC | PRN

## 2023-08-09 NOTE — Progress Notes (Signed)
 Subjective: CC: URI PCP: Leon Guerin, DO [id number] [name] is a 33 y.o. male presenting to clinic today for:  1.  URI Patient reports has had about a 1 week history of progressive cough that is productive.  It is now becoming yellow-brown sputum.  He reports sore throat, right sided ear fullness that has been pretty uncomfortable.  He reports no fevers but worries about possible STI causing sore throat as he was recently sexually active with a new partner.  He is heterosexual.  He reports no rashes.  No known exposures to STIs.  Not utilizing any antihistamines.  2.  Obesity with history of alcohol use disorder The patient is requesting lipid panel and vitamin B1 testing per his nutritionist request.  He is actively working with them to try and improve weight and overall health.   ROS: Per HPI  Allergies  Allergen Reactions   Other Hives and Shortness Of Breath    Dogs and Cats.   Dust Mite Extract    Pollen Extract    Past Medical History:  Diagnosis Date   Anxiety    Asthma    Bipolar 1 disorder (HCC)    Hallucinations    Mild intermittent asthma with (acute) exacerbation 02/18/2019   Psychosis (HCC) 06/14/2013   Schizo-affective schizophrenia, chronic condition with acute exacerbation (HCC) 03/28/2018   Schizoaffective disorder (HCC)    Schizophrenia (HCC)    SIRS (systemic inflammatory response syndrome) (HCC) 02/18/2019    Current Outpatient Medications:    albuterol  (PROAIR  HFA) 108 (90 Base) MCG/ACT inhaler, Inhale 2 puffs into the lungs every 6 (six) hours as needed for shortness of breath., Disp: 6.7 g, Rfl: 0   cycloSPORINE (RESTASIS) 0.05 % ophthalmic emulsion, 1 drop 2 (two) times daily., Disp: , Rfl:    diclofenac  Sodium (VOLTAREN ) 1 % GEL, Apply 2-4 grams to affected joint 4 times daily as needed., Disp: 400 g, Rfl: 2   diphenhydrAMINE -Zinc Acetate (BENADRYL  EX), Apply topically as needed., Disp: , Rfl:    fluticasone -salmeterol (ADVAIR ) 250-50  MCG/ACT AEPB, Inhale 1 puff into the lungs daily., Disp: , Rfl:    folic acid  (FOLVITE ) 1 MG tablet, Take 1 tablet (1 mg total) by mouth daily. (Patient not taking: Reported on 06/28/2023), Disp: 90 tablet, Rfl: 1   HYRIMOZ  40 MG/0.4ML SOAJ, INJECT 1 PEN UNDER THE SKIN EVERY 14 DAYS, Disp: 2 mL, Rfl: 0   IBUPROFEN  PO, Take by mouth as needed., Disp: , Rfl:    Multiple Vitamin (MULTIVITAMIN PO), Take by mouth daily., Disp: , Rfl:    perphenazine  (TRILAFON ) 4 MG tablet, Take 1 tablet (4 mg total) by mouth 2 (two) times daily., Disp: 180 tablet, Rfl: 1   sertraline  (ZOLOFT ) 50 MG tablet, Take 1 tablet (50 mg total) by mouth daily., Disp: 90 tablet, Rfl: 1   thiamine  (VITAMIN B1) 100 MG tablet, Take 1 tablet (100 mg total) by mouth daily. (Patient not taking: Reported on 06/28/2023), Disp: 90 tablet, Rfl: 1   triamcinolone  cream (KENALOG ) 0.1 %, Apply 1 Application topically 2 (two) times daily as needed (poison oak/ivy rash (max 10 days per use))., Disp: 80 g, Rfl: 0   UNABLE TO FIND, Med Name: Bug spray topically, daily use., Disp: , Rfl:  Social History   Socioeconomic History   Marital status: Single    Spouse name: Not on file   Number of children: Not on file   Years of education: Not on file   Highest education level: Some college,  no degree  Occupational History   Not on file  Tobacco Use   Smoking status: Former    Current packs/day: 0.00    Types: Cigarettes    Start date: 11/27/2017    Quit date: 05/28/2018    Years since quitting: 5.2    Passive exposure: Past   Smokeless tobacco: Never  Vaping Use   Vaping status: Former  Substance and Sexual Activity   Alcohol use: Yes    Alcohol/week: 14.0 standard drinks of alcohol    Types: 14 Cans of beer per week    Comment: 80-120oz daily   Drug use: Not Currently    Types: Marijuana   Sexual activity: Not Currently  Other Topics Concern   Not on file  Social History Narrative   Not on file   Social Drivers of Health    Financial Resource Strain: High Risk (05/31/2023)   Overall Financial Resource Strain (CARDIA)    Difficulty of Paying Living Expenses: Very hard  Food Insecurity: Food Insecurity Present (05/31/2023)   Hunger Vital Sign    Worried About Running Out of Food in the Last Year: Often true    Ran Out of Food in the Last Year: Often true  Transportation Needs: Unmet Transportation Needs (05/31/2023)   PRAPARE - Transportation    Lack of Transportation (Medical): Yes    Lack of Transportation (Non-Medical): Yes  Physical Activity: Sufficiently Active (05/31/2023)   Exercise Vital Sign    Days of Exercise per Week: 4 days    Minutes of Exercise per Session: 100 min  Stress: Stress Concern Present (05/31/2023)   [employer] of Occupational Health - Occupational Stress Questionnaire    Feeling of Stress : Very much  Social Connections: Moderately Integrated (05/31/2023)   Social Connection and Isolation Panel [NHANES]    Frequency of Communication with Friends and Family: More than three times a week    Frequency of Social Gatherings with Friends and Family: Once a week    Attends Religious Services: More than 4 times per year    Active Member of [employer] or Organizations: Yes    Attends Banker Meetings: More than 4 times per year    Marital Status: Never married  Intimate Partner Violence: Unknown (06/12/2021)   Received from [employer], [facility]   HITS    Physically Hurt: Not on file    Insult or Talk Down To: Not on file    Threaten Physical Harm: Not on file    Scream or Curse: Not on file   Family History  Problem Relation Age of Onset   Bipolar disorder Mother    Alcoholism Mother    Diabetes Mother    Cervical cancer Mother    OCD Father    Bipolar disorder Father    Eczema Brother    Post-traumatic stress disorder Brother    COPD Maternal Aunt    Heart attack Maternal Grandmother    Breast cancer Maternal Grandmother    Heart attack Maternal  Grandfather    Cancer Paternal Grandmother    Heart attack Paternal Grandfather    Asthma Neg Hx    Allergic rhinitis Neg Hx    Angioedema Neg Hx    Urticaria Neg Hx    Immunodeficiency Neg Hx    Atopy Neg Hx    Prostate cancer Neg Hx    Colon cancer Neg Hx     Objective: Office vital signs reviewed. BP 119/76   Pulse 80   Temp 98.7  F (37.1 C)   Ht 6\' 2"  (1.88 m)   Wt (!) 303 lb 9.6 oz (137.7 kg)   SpO2 95%   BMI 38.98 kg/m   Physical Examination:  General: Awake, alert, ill-appearing, No acute distress HEENT: Normal    Neck: No masses palpated. No lymphadenopathy    Ears: Tympanic membranes intact, dull light reflex with bulging TM on the right.  Purulent effusion appreciated behind the TM.  Erythema in bilateral external auditory canals    Eyes: PERRLA, extraocular membranes intact, sclera white    Nose: nasal turbinates moist, clear nasal discharge    Throat: moist mucus membranes, moderate oropharyngeal erythema, no tonsillar exudate.  Airway is patent Cardio: regular rate and rhythm, S1S2 heard, no murmurs appreciated Pulm: clear to auscultation bilaterally, no wheezes, rhonchi or rales; normal work of breathing on room air  DG Chest 2 View Result Date: 08/09/2023 CLINICAL DATA:  congestion - coughing up yellowish brown , chest pain sob EXAM: CHEST - 2 VIEW COMPARISON:  12/04/2020 FINDINGS: Lungs are clear. Heart size and mediastinal contours are within normal limits. No effusion. Visualized bones unremarkable. IMPRESSION: No acute cardiopulmonary disease. Electronically Signed   By: Nicoletta Barrier M.D.   On: 08/09/2023 15:50    Assessment/ Plan: 33 y.o. male   Acute bacterial bronchitis - Plan: DG Chest 2 View, CMP14+EGFR, CBC with Differential, cefdinir (OMNICEF) 300 MG capsule, benzonatate (TESSALON PERLES) 100 MG capsule  Acute otitis media, right - Plan: cefdinir (OMNICEF) 300 MG capsule, levocetirizine (XYZAL ) 5 MG tablet  SOB (shortness of breath) - Plan: DG  Chest 2 View, CMP14+EGFR, CBC with Differential  BMI 40.0-44.9, adult (HCC) - Plan: Lipid panel, Vitamin B1, CMP14+EGFR, CBC with Differential  Alcohol use disorder, severe, dependence (HCC) - Plan: Lipid panel, Vitamin B1, CMP14+EGFR, CBC with Differential  Screening examination for STI - Plan: CMP14+EGFR, CBC with Differential, HIV antibody (with reflex), Hepatitis C antibody, RPR, Cytology (oral, anal, urethral) ancillary only, WET PREP FOR TRICH, YEAST, CLUE, CANCELED: WET PREP FOR TRICH, YEAST, CLUE  Sore throat - Plan: CMP14+EGFR, CBC with Differential, Cytology (oral, anal, urethral) ancillary only, magic mouthwash SOLN, WET PREP FOR TRICH, YEAST, CLUE, CANCELED: WET PREP FOR TRICH, YEAST, CLUE  Appears to have acute bacterial bronchitis and right acute otitis media.  Plain films were obtained and he did appear to have some haziness in the medial right lower lobe.  Unsure if this is atelectasis versus some type of true pulmonary infiltrate.  I considered placing him on azithromycin  but given history of QT prolongation I was hesitant to do so as his medications for psychiatric interventions have not changed.  Instead I placed him on Omnicef, gave him cough medication and oral antihistamines.  His physical exam was fairly unremarkable except for oropharyngeal erythema.  This was swabbed for STIs to include GC, chlamydia and trichomonas testing.  CMP, CBC, HIV, hepatitis C and RPR also collected today  I obtain lipid panel and B1 as requested by his nutritionist.   Leon Guerin, DO [facility] [phone]

## 2023-08-09 NOTE — Patient Instructions (Signed)
 Ordered Cefdinir instead of Zpak to reduce risk of heart rhythm issues with your other meds. The Cefdinir WILL cover for ear and lungs but not STI so if we see STI on labs, will contact you with next instructions.

## 2023-08-10 ENCOUNTER — Ambulatory Visit (INDEPENDENT_AMBULATORY_CARE_PROVIDER_SITE_OTHER): Admitting: Psychiatry

## 2023-08-11 LAB — CYTOLOGY, (ORAL, ANAL, URETHRAL) ANCILLARY ONLY
Chlamydia: NEGATIVE
Comment: NEGATIVE
Comment: NORMAL
Neisseria Gonorrhea: NEGATIVE

## 2023-08-12 ENCOUNTER — Encounter: Payer: Self-pay | Admitting: Adult Health

## 2023-08-12 ENCOUNTER — Telehealth (INDEPENDENT_AMBULATORY_CARE_PROVIDER_SITE_OTHER): Admitting: Adult Health

## 2023-08-12 DIAGNOSIS — F411 Generalized anxiety disorder: Secondary | ICD-10-CM | POA: Diagnosis not present

## 2023-08-12 DIAGNOSIS — F250 Schizoaffective disorder, bipolar type: Secondary | ICD-10-CM | POA: Diagnosis not present

## 2023-08-12 DIAGNOSIS — G4700 Insomnia, unspecified: Secondary | ICD-10-CM | POA: Diagnosis not present

## 2023-08-12 DIAGNOSIS — F1020 Alcohol dependence, uncomplicated: Secondary | ICD-10-CM

## 2023-08-12 NOTE — Progress Notes (Signed)
 Leon Mason [id number] 04-08-1990 33 y.o.  Virtual Visit via Video Note  I connected with pt @ on 08/12/23 at 12:00 PM EDT by a video enabled telemedicine application and verified that I am speaking with the correct person using two identifiers.   I discussed the limitations of evaluation and management by telemedicine and the availability of in person appointments. The patient expressed understanding and agreed to proceed.  I discussed the assessment and treatment plan with the patient. The patient was provided an opportunity to ask questions and all were answered. The patient agreed with the plan and demonstrated an understanding of the instructions.   The patient was advised to call back or seek an in-person evaluation if the symptoms worsen or if the condition fails to improve as anticipated.  I provided 25 minutes of non-face-to-face time during this encounter.  The patient was located at home.  The provider was located at [facility].   Reagan Camera, NP   Subjective:   Patient ID:  Leon Mason is a 33 y.o. (DOB [birth year]) male.  Chief Complaint: No chief complaint on file.   HPI Diaz Crago Delmundo presents for follow-up of GAD, insomnia, Schizophrenia and alcohol use disorder.   Describes mood today as "not the best". Pleasant. Mood symptoms - reports depression - "hopelessness". Reports decreased interest and motivation. Reports anxiety and irritability - "family drama". Reports dysfunctional family dynamics. Reports panic attacks. Reports some worry rumination and over thinking. Reports being disturbed by state of the country. Reports worrying about the future - "if I will ever succeed in life". Reports recovering from recent illness. Reports financial stressors. Reports abusing alcohol, THC and other substances. Reports mood is low. Stating "I don't feel like I'm doing too good - 6.8 out of 10". Feels like medications are helpful. Taking medications as  prescribed. Energy levels stable. Active, does not have a regular exercise routine.  Enjoys some usual interests and activities. Single. Lives alone. Mother (disabled) and brother live 25 minutes away. Spending time with friends.  Appetite adequate - has not been eating a healthy diet. Weight loss - 303 from 315 pounds. Sleeps well most nights. Averages 8 or more hours. Focus and concentration stable. Completing tasks. Managing aspects of household. 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   Denies SI . Denies HI. Denies AH or VH. Reports paranoid thoughts - concerned about politics. Denies self harm. Denies substance use. Reports alcohol use - 4 days a week.    Seeing Delora Ferry for therapy.  Previous medications: Perphenazine , Lithium , Sertraline , Haldol, Zyprexa, Risperdal , stopped due to various side effects, ie biting tongue a lot, restless legs, weight gain     Review of Systems:  Review of Systems  Musculoskeletal:  Negative for gait problem.  Neurological:  Negative for tremors.  Psychiatric/Behavioral:         Please refer to HPI    Medications: I have reviewed the patient's current medications.  Current Outpatient Medications  Medication Sig Dispense Refill   albuterol  (PROAIR  HFA) 108 (90 Base) MCG/ACT inhaler Inhale 2 puffs into the lungs every 6 (six) hours as needed for shortness of breath. 6.7 g 0   benzonatate (TESSALON PERLES) 100 MG capsule Take 2 capsules (200 mg total) by mouth 3 (three) times daily as needed for cough. 20 capsule 0   cefdinir (OMNICEF) 300 MG capsule Take 1 capsule (300 mg total) by mouth 2 (two) times daily. 1 po BID 20 capsule 0  cycloSPORINE (RESTASIS) 0.05 % ophthalmic emulsion 1 drop 2 (two) times daily.     diclofenac  Sodium (VOLTAREN ) 1 % GEL Apply 2-4 grams to affected joint 4 times daily as needed. 400 g 2   diphenhydrAMINE -Zinc Acetate (BENADRYL  EX) Apply topically as needed.     fluticasone -salmeterol (ADVAIR )  250-50 MCG/ACT AEPB Inhale 1 puff into the lungs daily.     folic acid  (FOLVITE ) 1 MG tablet Take 1 tablet (1 mg total) by mouth daily. 90 tablet 1   HYRIMOZ  40 MG/0.4ML SOAJ INJECT 1 PEN UNDER THE SKIN EVERY 14 DAYS 2 mL 0   IBUPROFEN  PO Take by mouth as needed.     levocetirizine (XYZAL ) 5 MG tablet Take 1 tablet (5 mg total) by mouth at bedtime as needed for allergies (runny nose). 90 tablet 3   magic mouthwash SOLN Gargle and spit out 10mL every 6 hours as needed for sore throat. Suspension contains equal amounts of Maalox Extra Strength, nystatin , and diphenhydramine . 480 mL 0   Multiple Vitamin (MULTIVITAMIN PO) Take by mouth daily.     perphenazine  (TRILAFON ) 4 MG tablet Take 1 tablet (4 mg total) by mouth 2 (two) times daily. 180 tablet 1   sertraline  (ZOLOFT ) 50 MG tablet Take 1 tablet (50 mg total) by mouth daily. 90 tablet 1   thiamine  (VITAMIN B1) 100 MG tablet Take 1 tablet (100 mg total) by mouth daily. 90 tablet 1   triamcinolone  cream (KENALOG ) 0.1 % Apply 1 Application topically 2 (two) times daily as needed (poison oak/ivy rash (max 10 days per use)). 80 g 0   UNABLE TO FIND Med Name: Bug spray topically, daily use.     No current facility-administered medications for this visit.    Medication Side Effects: None  Allergies:  Allergies  Allergen Reactions   Other Hives and Shortness Of Breath    Dogs and Cats.   Dust Mite Extract    Pollen Extract     Past Medical History:  Diagnosis Date   Anxiety    Asthma    Bipolar 1 disorder (HCC)    Hallucinations    Mild intermittent asthma with (acute) exacerbation 02/18/2019   Psychosis (HCC) 06/14/2013   Schizo-affective schizophrenia, chronic condition with acute exacerbation (HCC) 03/28/2018   Schizoaffective disorder (HCC)    Schizophrenia (HCC)    SIRS (systemic inflammatory response syndrome) (HCC) 02/18/2019    Family History  Problem Relation Age of Onset   Bipolar disorder Mother    Alcoholism Mother     Diabetes Mother    Cervical cancer Mother    OCD Father    Bipolar disorder Father    Eczema Brother    Post-traumatic stress disorder Brother    COPD Maternal Aunt    Heart attack Maternal Grandmother    Breast cancer Maternal Grandmother    Heart attack Maternal Grandfather    Cancer Paternal Grandmother    Heart attack Paternal Grandfather    Asthma Neg Hx    Allergic rhinitis Neg Hx    Angioedema Neg Hx    Urticaria Neg Hx    Immunodeficiency Neg Hx    Atopy Neg Hx    Prostate cancer Neg Hx    Colon cancer Neg Hx     Social History   Socioeconomic History   Marital status: Single    Spouse name: Not on file   Number of children: Not on file   Years of education: Not on file   Highest education level: Some  college, no degree  Occupational History   Not on file  Tobacco Use   Smoking status: Former    Current packs/day: 0.00    Types: Cigarettes    Start date: 11/27/2017    Quit date: 05/28/2018    Years since quitting: 5.2    Passive exposure: Past   Smokeless tobacco: Never  Vaping Use   Vaping status: Former  Substance and Sexual Activity   Alcohol use: Yes    Alcohol/week: 14.0 standard drinks of alcohol    Types: 14 Cans of beer per week    Comment: 80-120oz daily   Drug use: Not Currently    Types: Marijuana   Sexual activity: Not Currently  Other Topics Concern   Not on file  Social History Narrative   Not on file   Social Drivers of Health   Financial Resource Strain: High Risk (05/31/2023)   Overall Financial Resource Strain (CARDIA)    Difficulty of Paying Living Expenses: Very hard  Food Insecurity: Food Insecurity Present (05/31/2023)   Hunger Vital Sign    Worried About Running Out of Food in the Last Year: Often true    Ran Out of Food in the Last Year: Often true  Transportation Needs: Unmet Transportation Needs (05/31/2023)   PRAPARE - Transportation    Lack of Transportation (Medical): Yes    Lack of Transportation (Non-Medical): Yes   Physical Activity: Sufficiently Active (05/31/2023)   Exercise Vital Sign    Days of Exercise per Week: 4 days    Minutes of Exercise per Session: 100 min  Stress: Stress Concern Present (05/31/2023)   [employer] of Occupational Health - Occupational Stress Questionnaire    Feeling of Stress : Very much  Social Connections: Moderately Integrated (05/31/2023)   Social Connection and Isolation Panel [NHANES]    Frequency of Communication with Friends and Family: More than three times a week    Frequency of Social Gatherings with Friends and Family: Once a week    Attends Religious Services: More than 4 times per year    Active Member of [employer] or Organizations: Yes    Attends Banker Meetings: More than 4 times per year    Marital Status: Never married  Intimate Partner Violence: Unknown (06/12/2021)   Received from [employer], [facility]   HITS    Physically Hurt: Not on file    Insult or Talk Down To: Not on file    Threaten Physical Harm: Not on file    Scream or Curse: Not on file    Past Medical History, Surgical history, Social history, and Family history were reviewed and updated as appropriate.   Please see review of systems for further details on the patient's review from today.   Objective:   Physical Exam:  There were no vitals taken for this visit.  Physical Exam Constitutional:      General: He is not in acute distress. Musculoskeletal:        General: No deformity.  Neurological:     Mental Status: He is alert and oriented to person, place, and time.     Coordination: Coordination normal.  Psychiatric:        Attention and Perception: Attention and perception normal. He does not perceive auditory or visual hallucinations.        Mood and Affect: Mood is anxious and depressed. Affect is not labile, blunt, angry or inappropriate.        Speech: Speech normal.  Behavior: Behavior normal.        Thought Content: Thought content normal.  Thought content is not paranoid or delusional. Thought content does not include homicidal or suicidal ideation. Thought content does not include homicidal or suicidal plan.        Cognition and Memory: Cognition and memory normal.        Judgment: Judgment normal.     Comments: Insight intact     Lab Review:     Component Value Date/Time   NA 136 08/09/2023 1444   K 4.5 08/09/2023 1444   CL 97 08/09/2023 1444   CO2 20 08/09/2023 1444   GLUCOSE 90 08/09/2023 1444   GLUCOSE 87 04/04/2023 1344   BUN 10 08/09/2023 1444   CREATININE 0.88 08/09/2023 1444   CREATININE 0.82 04/04/2023 1344   CALCIUM 9.7 08/09/2023 1444   PROT 7.9 08/09/2023 1444   ALBUMIN 4.7 08/09/2023 1444   AST 39 08/09/2023 1444   ALT 58 (H) 08/09/2023 1444   ALKPHOS 105 08/09/2023 1444   BILITOT 0.8 08/09/2023 1444   GFRNONAA >60 08/25/2022 1243   GFRAA >60 02/19/2019 0602       Component Value Date/Time   WBC 9.1 08/09/2023 1444   WBC 6.2 04/04/2023 1344   RBC 5.02 08/09/2023 1444   RBC 5.22 04/04/2023 1344   HGB 15.7 08/09/2023 1444   HCT 46.4 08/09/2023 1444   PLT 193 08/09/2023 1444   MCV 92 08/09/2023 1444   MCH 31.3 08/09/2023 1444   MCH 30.5 04/04/2023 1344   MCHC 33.8 08/09/2023 1444   MCHC 33.6 04/04/2023 1344   RDW 12.9 08/09/2023 1444   LYMPHSABS 2.1 08/09/2023 1444   MONOABS 0.7 08/25/2022 1243   EOSABS 0.3 08/09/2023 1444   BASOSABS 0.1 08/09/2023 1444    Lithium  Lvl  Date Value Ref Range Status  03/30/2018 <0.06 (L) 0.60 - 1.20 mmol/L Final    Comment:    REPEATED TO VERIFY Performed at [facility], [address], [place], [place] [address]      No results found for: "PHENYTOIN", "PHENOBARB", "VALPROATE", "CBMZ"   .res Assessment: Plan:   Plan:  PDMP reviewed  Discussed alcohol issues and he reports he is still struggling with it. Also advised to abstain from any form of THC.   Continue:  Zoloft  50mg   Perphenazine  - 4mg  - 2 at hs.  Therapist -  Delora Ferry.  EKG 06/01/2023   RTC 3 months  15 minutes spent dedicated to the care of this patient on the date of this encounter to include pre-visit review of records, ordering of medication, post visit documentation, and face-to-face time with the patient discussing MDD, GAD, insomnia, Schizophrenia, Prolonged QT interval. Discussed continuing current medication regimen.  Patient advised to contact office with any questions, adverse effects, or acute worsening in signs and symptoms.  Discussed potential metabolic side effects associated with atypical antipsychotics, as well as potential risk for movement side effects. Advised pt to contact office if movement side effects occur.    Diagnoses and all orders for this visit:  Schizoaffective disorder, bipolar type (HCC)  Generalized anxiety disorder  Alcohol use disorder, moderate, dependence (HCC)  Insomnia, unspecified type     Please see After Visit Summary for patient specific instructions.  Future Appointments  Date Time Provider Department Center  08/15/2023  9:50 AM Romayne Clubs, PA-C [facility] None  09/06/2023  2:00 PM Maretta Shaper, PhD [facility] None  10/10/2023  1:00 PM Maretta Shaper, PhD [facility]  None  11/08/2023  2:00 PM Mitchum, Porfirio Bristol, PhD [facility] None  12/02/2023 10:45 AM Eliodoro Guerin, DO [facility] None    No orders of the defined types were placed in this encounter.     -------------------------------

## 2023-08-13 LAB — CBC WITH DIFFERENTIAL/PLATELET
Basophils Absolute: 0.1 10*3/uL (ref 0.0–0.2)
Basos: 1 %
EOS (ABSOLUTE): 0.3 10*3/uL (ref 0.0–0.4)
Eos: 3 %
Hematocrit: 46.4 % (ref 37.5–51.0)
Hemoglobin: 15.7 g/dL (ref 13.0–17.7)
Immature Grans (Abs): 0 10*3/uL (ref 0.0–0.1)
Immature Granulocytes: 0 %
Lymphocytes Absolute: 2.1 10*3/uL (ref 0.7–3.1)
Lymphs: 23 %
MCH: 31.3 pg (ref 26.6–33.0)
MCHC: 33.8 g/dL (ref 31.5–35.7)
MCV: 92 fL (ref 79–97)
Monocytes Absolute: 0.7 10*3/uL (ref 0.1–0.9)
Monocytes: 8 %
Neutrophils Absolute: 5.9 10*3/uL (ref 1.4–7.0)
Neutrophils: 65 %
Platelets: 193 10*3/uL (ref 150–450)
RBC: 5.02 x10E6/uL (ref 4.14–5.80)
RDW: 12.9 % (ref 11.6–15.4)
WBC: 9.1 10*3/uL (ref 3.4–10.8)

## 2023-08-13 LAB — LIPID PANEL
Chol/HDL Ratio: 5.1 ratio — ABNORMAL HIGH (ref 0.0–5.0)
Cholesterol, Total: 245 mg/dL — ABNORMAL HIGH (ref 100–199)
HDL: 48 mg/dL (ref >39)
LDL Chol Calc (NIH): 164 mg/dL — ABNORMAL HIGH (ref 0–99)
Triglycerides: 179 mg/dL — ABNORMAL HIGH (ref 0–149)
VLDL Cholesterol Cal: 33 mg/dL (ref 5–40)

## 2023-08-13 LAB — CMP14+EGFR
ALT: 58 IU/L — ABNORMAL HIGH (ref 0–44)
AST: 39 IU/L (ref 0–40)
Albumin: 4.7 g/dL (ref 4.1–5.1)
Alkaline Phosphatase: 105 IU/L (ref 44–121)
BUN/Creatinine Ratio: 11 (ref 9–20)
BUN: 10 mg/dL (ref 6–20)
Bilirubin Total: 0.8 mg/dL (ref 0.0–1.2)
CO2: 20 mmol/L (ref 20–29)
Calcium: 9.7 mg/dL (ref 8.7–10.2)
Chloride: 97 mmol/L (ref 96–106)
Creatinine, Ser: 0.88 mg/dL (ref 0.76–1.27)
Globulin, Total: 3.2 g/dL (ref 1.5–4.5)
Glucose: 90 mg/dL (ref 70–99)
Potassium: 4.5 mmol/L (ref 3.5–5.2)
Sodium: 136 mmol/L (ref 134–144)
Total Protein: 7.9 g/dL (ref 6.0–8.5)
eGFR: 116 mL/min/{1.73_m2} (ref >59)

## 2023-08-13 LAB — HEPATITIS C ANTIBODY: Hep C Virus Ab: NONREACTIVE

## 2023-08-13 LAB — RPR: RPR Ser Ql: NONREACTIVE

## 2023-08-13 LAB — VITAMIN B1: Thiamine: 136.7 nmol/L (ref 66.5–200.0)

## 2023-08-13 LAB — HIV ANTIBODY (ROUTINE TESTING W REFLEX): HIV Screen 4th Generation wRfx: NONREACTIVE

## 2023-08-15 ENCOUNTER — Telehealth: Payer: Self-pay | Admitting: Family Medicine

## 2023-08-15 ENCOUNTER — Ambulatory Visit: Admitting: Physician Assistant

## 2023-08-15 DIAGNOSIS — M5030 Other cervical disc degeneration, unspecified cervical region: Secondary | ICD-10-CM

## 2023-08-15 DIAGNOSIS — G5622 Lesion of ulnar nerve, left upper limb: Secondary | ICD-10-CM

## 2023-08-15 DIAGNOSIS — Z79899 Other long term (current) drug therapy: Secondary | ICD-10-CM

## 2023-08-15 DIAGNOSIS — J4530 Mild persistent asthma, uncomplicated: Secondary | ICD-10-CM

## 2023-08-15 DIAGNOSIS — G8929 Other chronic pain: Secondary | ICD-10-CM

## 2023-08-15 DIAGNOSIS — R5383 Other fatigue: Secondary | ICD-10-CM

## 2023-08-15 DIAGNOSIS — Z1589 Genetic susceptibility to other disease: Secondary | ICD-10-CM

## 2023-08-15 DIAGNOSIS — H209 Unspecified iridocyclitis: Secondary | ICD-10-CM

## 2023-08-15 DIAGNOSIS — M546 Pain in thoracic spine: Secondary | ICD-10-CM

## 2023-08-15 DIAGNOSIS — F250 Schizoaffective disorder, bipolar type: Secondary | ICD-10-CM

## 2023-08-15 DIAGNOSIS — R6510 Systemic inflammatory response syndrome (SIRS) of non-infectious origin without acute organ dysfunction: Secondary | ICD-10-CM

## 2023-08-15 DIAGNOSIS — R9431 Abnormal electrocardiogram [ECG] [EKG]: Secondary | ICD-10-CM

## 2023-08-15 DIAGNOSIS — M47819 Spondylosis without myelopathy or radiculopathy, site unspecified: Secondary | ICD-10-CM

## 2023-08-15 DIAGNOSIS — F1020 Alcohol dependence, uncomplicated: Secondary | ICD-10-CM

## 2023-08-15 DIAGNOSIS — F17200 Nicotine dependence, unspecified, uncomplicated: Secondary | ICD-10-CM

## 2023-08-15 DIAGNOSIS — R002 Palpitations: Secondary | ICD-10-CM

## 2023-08-15 DIAGNOSIS — L500 Allergic urticaria: Secondary | ICD-10-CM

## 2023-08-15 LAB — TRICHOMONAS CULTURE

## 2023-08-15 NOTE — Telephone Encounter (Unsigned)
 Copied from CRM [id number]. Topic: Clinical - Lab/Test Results >> Aug 15, 2023  1:20 PM Leon Mason wrote: Reason for CRM: Pt called for the remained of his test result from June 3,2025  to be reviewed and discussed  Pt num [phone] (M)

## 2023-08-16 NOTE — Telephone Encounter (Signed)
 Pt aware rest of labs negative

## 2023-08-16 NOTE — Telephone Encounter (Signed)
 Labs are all NEGATIVE for STI.

## 2023-08-23 ENCOUNTER — Ambulatory Visit: Payer: Self-pay

## 2023-08-23 NOTE — Telephone Encounter (Signed)
 FYI Only or Action Required?: FYI only for provider  Patient was last seen in primary care on 08/09/2023 by Eliodoro Guerin, DO. Called Nurse Triage reporting Productive Cough. Symptoms began patient states after finishing antibiotic two days ago symptoms returned. Interventions attempted: Prescription medications: antibiotic. Symptoms are: unchanged.  Triage Disposition: See Physician Within 24 Hours  Patient/caregiver understands and will follow disposition?: Yes         Appointment made for tomorrow 08/24/2023 at 9:30 AM with Lugenia Said at PCP office.     Copied from CRM [id number]. Topic: Clinical - Red Word Triage >> Aug 23, 2023  1:14 PM Hassie Lint wrote: Red Word that prompted transfer to Nurse Triage: Patient was seen on 06/03 by Dr Bonnell Butcher. Says was placed on antibiotics and finished them 2 days ago. States yesterday his symptoms starting returning. Still has pain in his throat, fluid in his lungs, and water in his ears. Reason for Disposition  Earache  Answer Assessment - Initial Assessment Questions 1. ONSET: When did the cough begin?      After antibiotic 2. SEVERITY: How bad is the cough today?      N/a 3. SPUTUM: Describe the color of your sputum (none, dry cough; clear, white, yellow, green)     yellow 4. HEMOPTYSIS: Are you coughing up any blood? If so ask: How much? (flecks, streaks, tablespoons, etc.)     No 5. DIFFICULTY BREATHING: Are you having difficulty breathing? If Yes, ask: How bad is it? (e.g., mild, moderate, severe)    - MILD: No SOB at rest, mild SOB with walking, speaks normally in sentences, can lie down, no retractions, pulse < 100.    - MODERATE: SOB at rest, SOB with minimal exertion and prefers to sit, cannot lie down flat, speaks in phrases, mild retractions, audible wheezing, pulse 100-120.    - SEVERE: Very SOB at rest, speaks in single words, struggling to breathe, sitting hunched forward, retractions, pulse > 120       No 6. FEVER: Do you have a fever? If Yes, ask: What is your temperature, how was it measured, and when did it start?     No 7. CARDIAC HISTORY: Do you have any history of heart disease? (e.g., heart attack, congestive heart failure)      No 8. LUNG HISTORY: Do you have any history of lung disease?  (e.g., pulmonary embolus, asthma, emphysema)     Asthma 9. PE RISK FACTORS: Do you have a history of blood clots? (or: recent major surgery, recent prolonged travel, bedridden)     No 10. OTHER SYMPTOMS: Do you have any other symptoms? (e.g., runny nose, wheezing, chest pain)       Fluid in ears, tonsils hurting  Protocols used: Cough - Acute Productive-A-AH

## 2023-08-24 ENCOUNTER — Ambulatory Visit (INDEPENDENT_AMBULATORY_CARE_PROVIDER_SITE_OTHER)

## 2023-08-24 ENCOUNTER — Ambulatory Visit: Payer: Self-pay | Admitting: Family Medicine

## 2023-08-24 ENCOUNTER — Encounter: Payer: Self-pay | Admitting: Family Medicine

## 2023-08-24 ENCOUNTER — Ambulatory Visit: Admitting: Family Medicine

## 2023-08-24 VITALS — BP 134/90 | HR 97 | Temp 97.1°F | Ht 74.0 in | Wt 305.4 lb

## 2023-08-24 DIAGNOSIS — R051 Acute cough: Secondary | ICD-10-CM | POA: Diagnosis not present

## 2023-08-24 DIAGNOSIS — H6691 Otitis media, unspecified, right ear: Secondary | ICD-10-CM

## 2023-08-24 DIAGNOSIS — R058 Other specified cough: Secondary | ICD-10-CM | POA: Diagnosis not present

## 2023-08-24 DIAGNOSIS — J4541 Moderate persistent asthma with (acute) exacerbation: Secondary | ICD-10-CM | POA: Diagnosis not present

## 2023-08-24 MED ORDER — AMOXICILLIN-POT CLAVULANATE 875-125 MG PO TABS: 1.0000 | ORAL_TABLET | Freq: Two times a day (BID) | ORAL | 0 refills | Status: AC

## 2023-08-24 MED ORDER — PREDNISONE 20 MG PO TABS: 40.0000 mg | ORAL_TABLET | Freq: Every day | ORAL | 0 refills | Status: AC

## 2023-08-24 MED ORDER — CEFTRIAXONE SODIUM 1 G IJ SOLR
1.0000 g | Freq: Once | INTRAMUSCULAR | Status: AC
  Administered 2023-08-24: 1 g via INTRAMUSCULAR

## 2023-08-24 NOTE — Progress Notes (Signed)
 Acute Office Visit  Subjective:     Patient ID: Leon Mason, male    DOB: 08/06/1990, 33 y.o.   MRN: [id number]  Chief Complaint  Patient presents with   Cough    Cough This is a new problem. The problem occurs every few minutes. The cough is Productive of sputum (yellow). Associated symptoms include ear congestion and a sore throat. Pertinent negatives include no chest pain, chills, ear pain, fever, headaches, hemoptysis, myalgias, nasal congestion, postnasal drip, rhinorrhea, shortness of breath or wheezing. He has tried steroid inhaler and a beta-agonist inhaler (cefdinir ) for the symptoms. Improvement on treatment: improved while on antibitoic, then worsened agian a few days later. His past medical history is significant for asthma and pneumonia.   Hx of QT prolongation. Treated with cefdinir  x10 days. Prior CXR on 08/09/23 was negative.   He is immunocompromised- on Hyrimoz  injections.   Review of Systems  Constitutional:  Negative for chills and fever.  HENT:  Positive for sore throat. Negative for ear pain, postnasal drip and rhinorrhea.   Respiratory:  Positive for cough. Negative for hemoptysis, shortness of breath and wheezing.   Cardiovascular:  Negative for chest pain.  Musculoskeletal:  Negative for myalgias.  Neurological:  Negative for headaches.        Objective:    BP (!) 134/90   Pulse 97   Temp (!) 97.1 F (36.2 C) (Temporal)   Ht 6' 2 (1.88 m)   Wt (!) 305 lb 6.4 oz (138.5 kg)   SpO2 95%   BMI 39.21 kg/m    Physical Exam Vitals and nursing note reviewed.  Constitutional:      General: He is not in acute distress.    Appearance: Normal appearance. He is ill-appearing. He is not toxic-appearing or diaphoretic.  HENT:     Right Ear: Ear canal and external ear normal. A middle ear effusion is present. Tympanic membrane is erythematous. Tympanic membrane is not perforated or bulging.     Left Ear: Tympanic membrane, ear canal and external ear  normal.     Nose: Congestion present.     Mouth/Throat:     Mouth: Mucous membranes are moist.     Pharynx: Oropharynx is clear. No oropharyngeal exudate or posterior oropharyngeal erythema.   Eyes:     General:        Right eye: No discharge.        Left eye: No discharge.     Conjunctiva/sclera: Conjunctivae normal.    Cardiovascular:     Rate and Rhythm: Normal rate and regular rhythm.     Pulses: Normal pulses.     Heart sounds: Normal heart sounds. No murmur heard. Pulmonary:     Effort: Pulmonary effort is normal. No respiratory distress.     Breath sounds: Normal breath sounds. No wheezing, rhonchi or rales.  Chest:     Chest wall: No tenderness.  Abdominal:     General: Bowel sounds are normal. There is no distension.     Palpations: Abdomen is soft. There is no mass.     Tenderness: There is no abdominal tenderness. There is no guarding or rebound.   Musculoskeletal:     Cervical back: Neck supple. No tenderness.     Right lower leg: No edema.     Left lower leg: No edema.  Lymphadenopathy:     Cervical: No cervical adenopathy.   Skin:    General: Skin is warm and dry.   Neurological:  General: No focal deficit present.     Mental Status: He is alert and oriented to person, place, and time.   Psychiatric:        Mood and Affect: Mood normal.        Behavior: Behavior normal.     No results found for any visits on 08/24/23.      Assessment & Plan:   Tanya Marvin was seen today for cough.  Diagnoses and all orders for this visit:  Productive cough -     DG Chest 2 View; Future -     amoxicillin -clavulanate (AUGMENTIN ) 875-125 MG tablet; Take 1 tablet by mouth 2 (two) times daily for 7 days. Start tomorrow. -     predniSONE  (DELTASONE ) 20 MG tablet; Take 2 tablets (40 mg total) by mouth daily with breakfast for 5 days. -     cefTRIAXone (ROCEPHIN) injection 1 g  Acute otitis media, right -     amoxicillin -clavulanate (AUGMENTIN ) 875-125 MG  tablet; Take 1 tablet by mouth 2 (two) times daily for 7 days. Start tomorrow. -     cefTRIAXone (ROCEPHIN) injection 1 g  Moderate persistent asthma with acute exacerbation -     predniSONE  (DELTASONE ) 20 MG tablet; Take 2 tablets (40 mg total) by mouth daily with breakfast for 5 days. -     cefTRIAXone (ROCEPHIN) injection 1 g   No PNA noted on CXR today. Agree with radiology read. Rocephin IM today in office. Start augmentin  tomorrow. Prednisone  burst for asthma exacerbation. Continue adviar with albuterol  prn.   Return if symptoms worsen or fail to improve.  The patient indicates understanding of these issues and agrees with the plan.  Albertha Huger, FNP

## 2023-09-02 NOTE — Telephone Encounter (Signed)
 Has to be done with optometry

## 2023-09-06 ENCOUNTER — Ambulatory Visit: Admitting: Psychiatry

## 2023-09-06 DIAGNOSIS — Z638 Other specified problems related to primary support group: Secondary | ICD-10-CM | POA: Diagnosis not present

## 2023-09-06 DIAGNOSIS — Z636 Dependent relative needing care at home: Secondary | ICD-10-CM

## 2023-09-06 DIAGNOSIS — F1090 Alcohol use, unspecified, uncomplicated: Secondary | ICD-10-CM | POA: Diagnosis not present

## 2023-09-06 DIAGNOSIS — M459 Ankylosing spondylitis of unspecified sites in spine: Secondary | ICD-10-CM | POA: Diagnosis not present

## 2023-09-06 DIAGNOSIS — F411 Generalized anxiety disorder: Secondary | ICD-10-CM

## 2023-09-06 DIAGNOSIS — F250 Schizoaffective disorder, bipolar type: Secondary | ICD-10-CM

## 2023-09-06 NOTE — Progress Notes (Unsigned)
 Office Visit Note  Patient: Leon Mason             Date of Birth: [birth year]           MRN: [id number]             PCP: Jolinda Norene HERO, DO Referring: Jolinda Norene HERO, DO Visit Date: 09/07/2023 Occupation: @GUAROCC @  Subjective:  Medication monitoring  History of Present Illness: HERMENEGILDO Mason is a 33 y.o. male with history of spondyloarthropathy and iritis.  Patient is prescribed Hyrimoz  40 mg sq injections every 14 days.  He was recently treated with 2 courses of antibiotics for an ear infection, sinusitis, and tonsillitis.  His symptoms have finally started to subside so he plans on resuming Hyrimoz  once he receives a refill.  He denies any signs or symptoms of a flare recently.  He is not experiencing any SI joint discomfort at this time.  He denies any recurrence of pain and swelling in his knee joints.  He denies any iritis flares. Patient states that he has a patch of ringworm on his chest as well as a rash behind both knees.  He has not tried any over-the-counter products yet.  He has not yet followed up with his PCP for further evaluation.  Activities of Daily Living:  Patient reports morning stiffness for 30 minutes.   Patient Denies nocturnal pain.  Difficulty dressing/grooming: Denies Difficulty climbing stairs: Denies Difficulty getting out of chair: Denies Difficulty using hands for taps, buttons, cutlery, and/or writing: Denies  Review of Systems  Constitutional:  Negative for fatigue.  HENT:  Positive for mouth dryness. Negative for mouth sores.   Eyes:  Positive for dryness.  Respiratory:  Positive for shortness of breath.   Cardiovascular:  Negative for chest pain and palpitations.  Gastrointestinal:  Positive for diarrhea. Negative for blood in stool and constipation.  Endocrine: Negative for increased urination.  Genitourinary:  Negative for involuntary urination.  Musculoskeletal:  Positive for joint pain, joint pain and morning stiffness.  Negative for gait problem, joint swelling, myalgias, muscle weakness, muscle tenderness and myalgias.  Skin:  Positive for color change, rash and sensitivity to sunlight. Negative for hair loss.  Allergic/Immunologic: Negative for susceptible to infections.  Neurological:  Negative for dizziness and headaches.  Hematological:  Positive for swollen glands.  Psychiatric/Behavioral:  Positive for depressed mood and sleep disturbance. The patient is nervous/anxious.     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   Back pain without sciatica 02/16/2021   Neck pain 02/16/2021   Generalized joint pain 10/21/2020   Generalized abdominal pain 10/21/2020   Normocytic anemia 02/18/2019   Prolonged QT interval 02/18/2019   Globus sensation 07/04/2018   MDD (major depressive disorder), recurrent episode, severe (HCC) 03/28/2018   Tobacco use disorder 12/17/2016   Schizoaffective disorder, bipolar type (HCC) 08/23/2016   Alcohol use disorder, severe, dependence (HCC) 08/23/2016   Allergic rhinitis due to pollen 10/09/2015   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   Mild persistent allergic asthma 10/09/2015    Past Medical History:  Diagnosis Date   Anxiety    Asthma    Bipolar 1 disorder (HCC)    Hallucinations    Mild intermittent asthma with (acute) exacerbation 02/18/2019   Psychosis (HCC) 06/14/2013   Schizo-affective schizophrenia, chronic condition with acute exacerbation (HCC) 03/28/2018   Schizoaffective disorder (HCC)    Schizophrenia (HCC)    SIRS (systemic inflammatory response syndrome) (HCC) 02/18/2019  Family History  Problem Relation Age of Onset   Bipolar disorder Mother    Alcoholism Mother    Diabetes Mother    Cervical cancer Mother    OCD Father    Bipolar disorder Father    Eczema Brother    Post-traumatic stress disorder Brother    COPD Maternal Aunt    Heart attack Maternal Grandmother    Breast cancer  Maternal Grandmother    Heart attack Maternal Grandfather    Cancer Paternal Grandmother    Heart attack Paternal Grandfather    Asthma Neg Hx    Allergic rhinitis Neg Hx    Angioedema Neg Hx    Urticaria Neg Hx    Immunodeficiency Neg Hx    Atopy Neg Hx    Prostate cancer Neg Hx    Colon cancer Neg Hx    Past Surgical History:  Procedure Laterality Date   ingrown toenail Right 10/21/2022   great toe   WISDOM TOOTH EXTRACTION  2009   all four   Social History   Social History Narrative   Not on file   Immunization History  Administered Date(s) Administered   DTaP 01/03/1990, 03/17/1990, 06/08/1990, 02/20/1991, 06/01/1994   HIB (PRP-OMP) 01/03/1990, 03/17/1990, 06/08/1990, 02/20/1991   Hepatitis B 01/05/2001, 02/01/2001, 05/04/2005   Hepatitis B, PED/ADOLESCENT 01/05/2001, 02/01/2001, 05/04/2005   IPV 01/03/1990, 03/17/1990, 02/20/1991, 06/01/1994   Influenza, Seasonal, Injecte, Preservative Fre 12/28/2022   Influenza,inj,Quad PF,6+ Mos 02/20/2019, 02/16/2021   Influenza,inj,quad, With Preservative 03/18/2018   Influenza-Unspecified 03/18/2018   MMR 02/20/1991, 06/01/1994   Meningococcal Conjugate 05/04/2005   Pneumococcal Polysaccharide-23 08/29/2016, 02/20/2019   Tdap 05/04/2005, 12/26/2019     Objective: Vital Signs: BP (!) 146/83 (BP Location: Left Arm, Patient Position: Sitting, Cuff Size: Large)   Pulse 99   Resp 16   Ht 6' 2 (1.88 m)   Wt (!) 311 lb (141.1 kg)   BMI 39.93 kg/m    Physical Exam Vitals and nursing note reviewed.  Constitutional:      Appearance: He is well-developed.  HENT:     Head: Normocephalic and atraumatic.  Eyes:     Conjunctiva/sclera: Conjunctivae normal.     Pupils: Pupils are equal, round, and reactive to light.  Cardiovascular:     Rate and Rhythm: Normal rate and regular rhythm.     Heart sounds: Normal heart sounds.  Pulmonary:     Effort: Pulmonary effort is normal.     Breath sounds: Normal breath sounds.   Abdominal:     General: Bowel sounds are normal.     Palpations: Abdomen is soft.  Musculoskeletal:     Cervical back: Normal range of motion and neck supple.  Skin:    General: Skin is warm and dry.     Capillary Refill: Capillary refill takes less than 2 seconds.     Comments: Annular lesion noted on the anterior aspect of his chest. Erythema noted in the posterior aspect of both knees with some folliculitis noted.   Neurological:     Mental Status: He is alert and oriented to person, place, and time.  Psychiatric:        Behavior: Behavior normal.      Musculoskeletal Exam: C-spine, thoracic spine, lumbar spine have good range of motion.  No midline spinal tenderness.  No SI joint tenderness.  Shoulder joints, elbow joints, wrist joints, MCPs, PIPs, DIPs have good range of motion with no synovitis.  Complete fist formation bilaterally.  Hip joints have good range of  motion with no groin pain.  Knee joints have good range of motion no warmth or effusion.  Ankle joints have good range of motion with no tenderness or joint swelling.  No evidence of Achilles tendinitis or plantar fasciitis.  No tenderness or synovitis over MTP joints.  CDAI Exam: CDAI Score: -- Patient Global: --; Provider Global: -- Swollen: --; Tender: -- Joint Exam 09/07/2023   No joint exam has been documented for this visit   There is currently no information documented on the homunculus. Go to the Rheumatology activity and complete the homunculus joint exam.  Investigation: No additional findings.  Imaging: DG Chest 2 View Result Date: 08/24/2023 CLINICAL DATA:  Productive cough for 2 weeks. EXAM: CHEST - 2 VIEW COMPARISON:  Chest two views 08/09/2023 FINDINGS: Cardiac silhouette and mediastinal contours are within normal limits. The lungs are clear. No pleural effusion or pneumothorax. No acute skeletal abnormality. IMPRESSION: No active cardiopulmonary disease. Electronically Signed   By: Tanda Lyons M.D.    On: 08/24/2023 11:49   DG Chest 2 View Result Date: 08/09/2023 CLINICAL DATA:  congestion - coughing up yellowish brown , chest pain sob EXAM: CHEST - 2 VIEW COMPARISON:  12/04/2020 FINDINGS: Lungs are clear. Heart size and mediastinal contours are within normal limits. No effusion. Visualized bones unremarkable. IMPRESSION: No acute cardiopulmonary disease. Electronically Signed   By: JONETTA Faes M.D.   On: 08/09/2023 15:50    Recent Labs: Lab Results  Component Value Date   WBC 9.1 08/09/2023   HGB 15.7 08/09/2023   PLT 193 08/09/2023   NA 136 08/09/2023   K 4.5 08/09/2023   CL 97 08/09/2023   CO2 20 08/09/2023   GLUCOSE 90 08/09/2023   BUN 10 08/09/2023   CREATININE 0.88 08/09/2023   BILITOT 0.8 08/09/2023   ALKPHOS 105 08/09/2023   AST 39 08/09/2023   ALT 58 (H) 08/09/2023   PROT 7.9 08/09/2023   ALBUMIN 4.7 08/09/2023   CALCIUM 9.7 08/09/2023   GFRAA >60 02/19/2019   QFTBGOLDPLUS NEGATIVE 08/23/2022    Speciality Comments: Humira  started December 26, 2020  Procedures:  No procedures performed Allergies: Other, Dust mite extract, and Pollen extract    Assessment / Plan:     Visit Diagnoses: Spondyloarthropathy: He has no synovitis or dactylitis on examination today.  He has not had any signs or symptoms of a flare recently.  He has good range of motion of the cervical spine, thoracic spine, and lumbar spine on examination today.  No midline spinal tenderness.  No SI joint tenderness.  No evidence of Achilles tendinitis or plantar fasciitis.  No recurrence of knee joint swelling.  He has not had any recurrence of iritis.  He remains on Hyrimoz  40 mg subcutaneous injections every 14 days.  He is slightly overdue for his Hyrimoz  dose due to a recent infection as well as needing a refill of Hyrimoz  to be sent to the pharmacy.  Updated lab work including CBC, CMP, TB Gold were updated today.  A prescription for Hyrimoz  will be sent to the pharmacy.  He was advised to notify us  if  he develops any signs or symptoms of a flare.  He will follow-up in the office in 3 months or sooner if needed.  Iritis: He has not had any signs or symptoms of an iritis flare.  No eye pain, photophobia, or conjunctival injection.  He will remain on higher amounts as monotherapy.  He will notify us  if he develops any signs or  symptoms of a flare.  High risk medication use -Hyrimoz  40 mg sq injections every 14 days CBC and CMP updated on 04/04/23.  Orders for CBC and CMP released today.  TB gold negative on 08/23/22 Discussed the importance of holding Hyrimoz  if he develops signs or symptoms of an infection and to resume once the infection has completely cleared.   Plan: CBC with Differential/Platelet, Comprehensive metabolic panel with GFR, QuantiFERON-TB Gold Plus  Screening for tuberculosis -Order for TB gold released today.  Plan: QuantiFERON-TB Gold Plus  Rash: Annular lesion on the anterior aspect of his chest consistent with ringworm.  A prescription for ketoconazole 2% cream was sent to the pharmacy today.  If his symptoms persist or worsen he was encouraged to follow-up with his PCP for further evaluation and management.  HLA B27 positive  Entrapment of left ulnar nerve: He continues to experience intermittent paresthesias in his arms especially while sleeping at night.  Chronic pain of left knee: He had a left knee joint cortisone injection performed on 12/15/2022 which reported significant relief.  He has not had any recurrence of pain or swelling.  No effusion was noted on exam.  DDD (degenerative disc disease), cervical: C-spine has good range of motion on examination today.  Pain in thoracic spine: No midline spinal tenderness.  Chronic SI joint pain: No SI joint tenderness upon palpation.  He has not been experiencing any nocturnal pain.  Other medical conditions are listed as follows:  Other fatigue  Schizoaffective disorder, bipolar type (HCC)  Prolonged QT  interval  Palpitations  Mild persistent allergic asthma  Alcohol use disorder, severe, dependence (HCC)  Smoker  Allergic urticaria  SIRS (systemic inflammatory response syndrome) (HCC)   Orders: Orders Placed This Encounter  Procedures   CBC with Differential/Platelet   Comprehensive metabolic panel with GFR   QuantiFERON-TB Gold Plus   Meds ordered this encounter  Medications   ketoconazole (NIZORAL) 2 % cream    Sig: Apply 1 Application topically daily.    Dispense:  15 g    Refill:  0     Follow-Up Instructions: Return in about 3 months (around 12/08/2023) for Spondyloarthropathy, iritis.   Waddell [name] Craze, PA-C  Note - This record has been created using Dragon software.  Chart creation errors have been sought, but may not always  have been located. Such creation errors do not reflect on  the standard of medical care.
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
## 2023-09-07 ENCOUNTER — Other Ambulatory Visit: Payer: Self-pay

## 2023-09-07 ENCOUNTER — Encounter: Payer: Self-pay | Admitting: Physician Assistant

## 2023-09-07 ENCOUNTER — Ambulatory Visit: Attending: Physician Assistant | Admitting: Physician Assistant

## 2023-09-07 VITALS — BP 146/83 | HR 99 | Resp 16 | Ht 74.0 in | Wt 311.0 lb

## 2023-09-07 DIAGNOSIS — M47819 Spondylosis without myelopathy or radiculopathy, site unspecified: Secondary | ICD-10-CM

## 2023-09-07 DIAGNOSIS — Z79899 Other long term (current) drug therapy: Secondary | ICD-10-CM

## 2023-09-07 DIAGNOSIS — F250 Schizoaffective disorder, bipolar type: Secondary | ICD-10-CM

## 2023-09-07 DIAGNOSIS — M25562 Pain in left knee: Secondary | ICD-10-CM

## 2023-09-07 DIAGNOSIS — M5030 Other cervical disc degeneration, unspecified cervical region: Secondary | ICD-10-CM | POA: Diagnosis not present

## 2023-09-07 DIAGNOSIS — R002 Palpitations: Secondary | ICD-10-CM

## 2023-09-07 DIAGNOSIS — F17200 Nicotine dependence, unspecified, uncomplicated: Secondary | ICD-10-CM

## 2023-09-07 DIAGNOSIS — M546 Pain in thoracic spine: Secondary | ICD-10-CM

## 2023-09-07 DIAGNOSIS — R9431 Abnormal electrocardiogram [ECG] [EKG]: Secondary | ICD-10-CM

## 2023-09-07 DIAGNOSIS — J4530 Mild persistent asthma, uncomplicated: Secondary | ICD-10-CM

## 2023-09-07 DIAGNOSIS — G5622 Lesion of ulnar nerve, left upper limb: Secondary | ICD-10-CM | POA: Diagnosis not present

## 2023-09-07 DIAGNOSIS — M533 Sacrococcygeal disorders, not elsewhere classified: Secondary | ICD-10-CM | POA: Diagnosis not present

## 2023-09-07 DIAGNOSIS — R5383 Other fatigue: Secondary | ICD-10-CM

## 2023-09-07 DIAGNOSIS — L500 Allergic urticaria: Secondary | ICD-10-CM

## 2023-09-07 DIAGNOSIS — R6510 Systemic inflammatory response syndrome (SIRS) of non-infectious origin without acute organ dysfunction: Secondary | ICD-10-CM

## 2023-09-07 DIAGNOSIS — R21 Rash and other nonspecific skin eruption: Secondary | ICD-10-CM

## 2023-09-07 DIAGNOSIS — G8929 Other chronic pain: Secondary | ICD-10-CM

## 2023-09-07 DIAGNOSIS — Z1589 Genetic susceptibility to other disease: Secondary | ICD-10-CM

## 2023-09-07 DIAGNOSIS — Z111 Encounter for screening for respiratory tuberculosis: Secondary | ICD-10-CM

## 2023-09-07 DIAGNOSIS — H209 Unspecified iridocyclitis: Secondary | ICD-10-CM

## 2023-09-07 DIAGNOSIS — F1020 Alcohol dependence, uncomplicated: Secondary | ICD-10-CM

## 2023-09-07 MED ORDER — ADALIMUMAB-ADAZ 40 MG/0.4ML ~~LOC~~ SOAJ: 40.0000 mg | SUBCUTANEOUS | 0 refills | Status: DC

## 2023-09-07 MED ORDER — KETOCONAZOLE 2 % EX CREA: 1.0000 | TOPICAL_CREAM | Freq: Every day | CUTANEOUS | 0 refills | Status: AC

## 2023-09-07 NOTE — Telephone Encounter (Signed)
Please sign and review.

## 2023-09-08 ENCOUNTER — Ambulatory Visit: Payer: Self-pay | Admitting: Physician Assistant

## 2023-09-08 NOTE — Progress Notes (Signed)
 CBC WNL ALT remains elevated but has improved. AST WNL.rest of CMP WNL.

## 2023-09-11 LAB — COMPREHENSIVE METABOLIC PANEL WITH GFR
AG Ratio: 1.6 (calc) (ref 1.0–2.5)
ALT: 47 U/L — ABNORMAL HIGH (ref 9–46)
AST: 29 U/L (ref 10–40)
Albumin: 4.7 g/dL (ref 3.6–5.1)
Alkaline phosphatase (APISO): 58 U/L (ref 36–130)
BUN: 15 mg/dL (ref 7–25)
CO2: 25 mmol/L (ref 20–32)
Calcium: 9.6 mg/dL (ref 8.6–10.3)
Chloride: 103 mmol/L (ref 98–110)
Creat: 0.86 mg/dL (ref 0.60–1.26)
Globulin: 3 g/dL (ref 1.9–3.7)
Glucose, Bld: 93 mg/dL (ref 65–99)
Potassium: 4.1 mmol/L (ref 3.5–5.3)
Sodium: 137 mmol/L (ref 135–146)
Total Bilirubin: 0.5 mg/dL (ref 0.2–1.2)
Total Protein: 7.7 g/dL (ref 6.1–8.1)
eGFR: 117 mL/min/1.73m2 (ref >=60)

## 2023-09-11 LAB — CBC WITH DIFFERENTIAL/PLATELET
Absolute Lymphocytes: 2018 {cells}/uL (ref 850–3900)
Absolute Monocytes: 505 {cells}/uL (ref 200–950)
Basophils Absolute: 58 {cells}/uL (ref 0–200)
Basophils Relative: 1 %
Eosinophils Absolute: 180 {cells}/uL (ref 15–500)
Eosinophils Relative: 3.1 %
HCT: 46.5 % (ref 38.5–50.0)
Hemoglobin: 15.5 g/dL (ref 13.2–17.1)
MCH: 31.1 pg (ref 27.0–33.0)
MCHC: 33.3 g/dL (ref 32.0–36.0)
MCV: 93.4 fL (ref 80.0–100.0)
MPV: 11.8 fL (ref 7.5–12.5)
Monocytes Relative: 8.7 %
Neutro Abs: 3039 {cells}/uL (ref 1500–7800)
Neutrophils Relative %: 52.4 %
Platelets: 160 Thousand/uL (ref 140–400)
RBC: 4.98 Million/uL (ref 4.20–5.80)
RDW: 13.6 % (ref 11.0–15.0)
Total Lymphocyte: 34.8 %
WBC: 5.8 Thousand/uL (ref 3.8–10.8)

## 2023-09-11 LAB — QUANTIFERON-TB GOLD PLUS
Mitogen-NIL: 8.11 [IU]/mL
NIL: 0.01 [IU]/mL
QuantiFERON-TB Gold Plus: NEGATIVE
TB1-NIL: 0 [IU]/mL
TB2-NIL: 0 [IU]/mL

## 2023-09-11 NOTE — Progress Notes (Signed)
 TB gold negative

## 2023-09-27 ENCOUNTER — Telehealth: Payer: Self-pay | Admitting: Adult Health

## 2023-09-27 NOTE — Telephone Encounter (Signed)
 Pt has RF available. He was notified.

## 2023-09-27 NOTE — Telephone Encounter (Signed)
 Pt requesting RF for Perphenazine  4 mg  tablet at [facility] [address]

## 2023-10-10 ENCOUNTER — Ambulatory Visit: Admitting: Psychiatry

## 2023-10-10 NOTE — Progress Notes (Deleted)
 Psychotherapy Progress Note [facility] [name], PhD LP  Patient ID: Leon Mason)    MRN: [id number] Therapy format: {Therapy Types:21967::Individual psychotherapy} Date: 10/10/2023      Start: ***:***     Stop: ***:***     Time Spent: *** min Location: {SvcLoc:22530::In-person}   Session narrative (presenting needs, interim history, self-report of stressors and symptoms, applications of prior therapy, status changes, and interventions made in session) ***  Therapeutic modalities: {AM:23362::Cognitive Behavioral Therapy,Solution-Oriented/Positive Psychology}  Mental Status/Observations:  Appearance:   {PSY:22683}     Behavior:  {PSY:21022743}  Motor:  {PSY:22302}  Speech/Language:   {PSY:22685}  Affect:  {PSY:22687}  Mood:  {PSY:31886}  Thought process:  {PSY:31888}  Thought content:    {PSY:902-685-4417}  Sensory/Perceptual disturbances:    {PSY:[phone]}  Orientation:  {Psych Orientation:23301::Fully oriented}  Attention:  {Good-Fair-Poor ratings:23770::Good}    Concentration:  {Good-Fair-Poor ratings:23770::Good}  Memory:  {PSY:4695377857}  Insight:    {Good-Fair-Poor ratings:23770::Good}  Judgment:   {Good-Fair-Poor ratings:23770::Good}  Impulse Control:  {Good-Fair-Poor ratings:23770::Good}   Risk Assessment: Danger to Self: {Risk:22599::No} Self-injurious Behavior: {Risk:22599::No} Danger to Others: {Risk:22599::No} Physical Aggression / Violence: {Risk:22599::No} Duty to Warn: {AMYesNo:22526::No} Access to Firearms a concern: {AMYesNo:22526::No}  Assessment of progress:  {Progress:22147::progressing}  Diagnosis: No diagnosis found. Plan:  *** Other recommendations/advice -- As may be noted above.  Continue to utilize previously learned skills ad lib. Medication compliance -- Maintain medication as prescribed and work faithfully with relevant prescriber(s) if any changes are desired or  seem indicated. Crisis service -- Aware of call list and work-in appts.  Call the clinic on-call service, 988/hotline, 911, or present to [facility] or ER if any life-threatening psychiatric crisis. Followup -- No follow-ups on file.  Next scheduled visit with me 11/08/2023.  Next scheduled in this office 11/08/2023.  [name], PhD [name], PhD LP Clinical Psychologist, [facility] [address], [address] [place], [place] [address] [phone]

## 2023-10-10 NOTE — Progress Notes (Signed)
 No-show/Short-notice cancellation charge note  Patient ID: Leon Mason     MRN: [id number]     Date: 10/10/2023     Appt time: @APPT_TIME @  Pt Failed to show for appointment at 1pm.  Waive fee professional courtesy due to low income and possible confusion/conclusion-jumping what to do if unable to afford the visit.  RS as able.  Will ensure he knows next time to call if he changes plans, so we can engage another patient.  Followup -- Next scheduled visit with me 11/08/2023.  Next scheduled in this office 11/08/2023.  [name], PhD [name], PhD LP Clinical Psychologist, [facility] [address], [address] [place], [place] [address] [phone]

## 2023-10-13 ENCOUNTER — Telehealth: Payer: Self-pay

## 2023-10-13 NOTE — Telephone Encounter (Signed)
 Patient left a VM stating he has run out of patient assistance for his Hyrimoz . Patients also states that Hyrimoz  representative has explained to his a way that the RX prescription would be fully covered by writing up the prescription differently. Patient would like a call back to explain the process.

## 2023-10-14 NOTE — Telephone Encounter (Signed)
 Called [facility] - they changed NDC to [phone] and ran copay card successfully. No copay. Patient will need to call back to schedule. MyChart message sent to patient    Sherry Pennant, PharmD, MPH, BCPS, CPP Clinical Pharmacist (Rheumatology and Pulmonology)

## 2023-11-08 ENCOUNTER — Ambulatory Visit: Admitting: Psychiatry

## 2023-11-08 NOTE — Progress Notes (Deleted)
 Psychotherapy Progress Note [facility] [name], PhD LP  Patient ID: Leon Mason)    MRN: [id number] Therapy format: {Therapy Types:21967::Individual psychotherapy} Date: 11/08/2023      Start: ***:***     Stop: ***:***     Time Spent: *** min Location: {SvcLoc:22530::In-person}   Session narrative (presenting needs, interim history, self-report of stressors and symptoms, applications of prior therapy, status changes, and interventions made in session) ***  Therapeutic modalities: {AM:23362::Cognitive Behavioral Therapy,Solution-Oriented/Positive Psychology}  Mental Status/Observations:  Appearance:   {PSY:22683}     Behavior:  {PSY:21022743}  Motor:  {PSY:22302}  Speech/Language:   {PSY:22685}  Affect:  {PSY:22687}  Mood:  {PSY:31886}  Thought process:  {PSY:31888}  Thought content:    {PSY:781-218-5858}  Sensory/Perceptual disturbances:    {PSY:548 820 3507}  Orientation:  {Psych Orientation:23301::Fully oriented}  Attention:  {Good-Fair-Poor ratings:23770::Good}    Concentration:  {Good-Fair-Poor ratings:23770::Good}  Memory:  {PSY:551-884-0489}  Insight:    {Good-Fair-Poor ratings:23770::Good}  Judgment:   {Good-Fair-Poor ratings:23770::Good}  Impulse Control:  {Good-Fair-Poor ratings:23770::Good}   Risk Assessment: Danger to Self: {Risk:22599::No} Self-injurious Behavior: {Risk:22599::No} Danger to Others: {Risk:22599::No} Physical Aggression / Violence: {Risk:22599::No} Duty to Warn: {AMYesNo:22526::No} Access to Firearms a concern: {AMYesNo:22526::No}  Assessment of progress:  {Progress:22147::progressing}  Diagnosis: No diagnosis found. Plan:  *** Other recommendations/advice -- As may be noted above.  Continue to utilize previously learned skills ad lib. Medication compliance -- Maintain medication as prescribed and work faithfully with relevant prescriber(s) if any changes are desired or  seem indicated. Crisis service -- Aware of call list and work-in appts.  Call the clinic on-call service, 988/hotline, 911, or present to [facility] or ER if any life-threatening psychiatric crisis. Followup -- No follow-ups on file.  Next scheduled visit with me 12/13/2023.  Next scheduled in this office 11/10/2023.  [name], PhD [name], PhD LP Clinical Psychologist, [facility] [address] [place], [place] [address] [phone]

## 2023-11-08 NOTE — Progress Notes (Signed)
 No-show/Short-notice cancellation charge note  Patient ID: Leon Mason     MRN: [id number]     Date: 11/08/2023     Appt time: 2pm  Failed to show for appointment at 2pm.  No call received.  Waive fee professional courtesy due to poverty.  RS as able.    Followup -- Next scheduled visit with me 12/13/2023.  Next scheduled in this office 11/10/2023.  Will apprise psychiatry of the misses last 2 visits.  [name], PhD [name], PhD LP Clinical Psychologist, [facility] [address] [name], [place] [address] [phone]

## 2023-11-10 ENCOUNTER — Telehealth: Admitting: Adult Health

## 2023-11-10 ENCOUNTER — Encounter: Payer: Self-pay | Admitting: Adult Health

## 2023-11-10 DIAGNOSIS — F1090 Alcohol use, unspecified, uncomplicated: Secondary | ICD-10-CM | POA: Diagnosis not present

## 2023-11-10 DIAGNOSIS — F259 Schizoaffective disorder, unspecified: Secondary | ICD-10-CM | POA: Diagnosis not present

## 2023-11-10 DIAGNOSIS — F250 Schizoaffective disorder, bipolar type: Secondary | ICD-10-CM

## 2023-11-10 DIAGNOSIS — G4700 Insomnia, unspecified: Secondary | ICD-10-CM

## 2023-11-10 DIAGNOSIS — F411 Generalized anxiety disorder: Secondary | ICD-10-CM

## 2023-11-10 MED ORDER — PERPHENAZINE 4 MG PO TABS: 4.0000 mg | ORAL_TABLET | Freq: Two times a day (BID) | ORAL | 1 refills | Status: AC

## 2023-11-10 MED ORDER — SERTRALINE HCL 50 MG PO TABS: 50.0000 mg | ORAL_TABLET | Freq: Every day | ORAL | 1 refills | Status: DC
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
## 2023-11-21 ENCOUNTER — Other Ambulatory Visit: Payer: Self-pay | Admitting: Physician Assistant

## 2023-11-21 ENCOUNTER — Encounter: Payer: Self-pay | Admitting: Family Medicine

## 2023-11-21 DIAGNOSIS — H209 Unspecified iridocyclitis: Secondary | ICD-10-CM

## 2023-11-21 DIAGNOSIS — M47819 Spondylosis without myelopathy or radiculopathy, site unspecified: Secondary | ICD-10-CM

## 2023-11-21 DIAGNOSIS — Z79899 Other long term (current) drug therapy: Secondary | ICD-10-CM

## 2023-11-22 NOTE — Telephone Encounter (Signed)
 Last Fill: 09/07/2023  Labs: 09/07/2023 CBC WNL ALT remains elevated but has improved. AST WNL.rest of CMP WNL.  TB Gold: 09/07/2023 TB gold negative   Next Visit: 12/13/2023  Last Visit: 09/07/2023  IK:[id number]   Current Dose per office note 09/07/2023: Hyrimoz  40 mg sq injections every 14 days   Okay to refill Hyrimoz ?

## 2023-12-02 ENCOUNTER — Ambulatory Visit: Admitting: Family Medicine

## 2023-12-08 ENCOUNTER — Telehealth: Payer: Self-pay | Admitting: *Deleted

## 2023-12-08 NOTE — Telephone Encounter (Signed)
 Patient contacted the office and states his Hyrimoz  needs a new Prior Authorization.

## 2023-12-09 ENCOUNTER — Telehealth: Payer: Self-pay

## 2023-12-09 NOTE — Telephone Encounter (Signed)
 Cena Alfonso CROME, LPN  [birth year]  6:78 PM Note Patient contacted the office and states his Hyrimoz  needs a new Prior Authorization.     -------------------------------------------------------------------------------------------------------------  Most of pt's plan info appears to be the same, however the Group number appears to have changed.  Attempted to submit a Prior Authorization request to [facility] for HYRIMOZ  via CoverMyMeds, however request was cancelled stating that PA has already been resolved.  Key: [id number]   Reached out [facility] and the rep informs me that he does NOT need a PA, he had what's called an insurance diversion which the rep explains means that either the insurance refused to pay OR he had a remaining copay after payment. I advised that he had a copay card on file and provided the information. The rep states that the card info for the [facility] [id number] comes back as expired, however the card info for the [facility] [address] appears to go through. She then reached out to the billing dept for additional assistance.   There appears to still be a remaining copay even after the card pays it's portion and I stated that I would have the pt f/u with the copay card program to find out what the issue is or to obtain a manufacturer debit card if needed.  Contacted pt and provided update as well as phone number to copay card company. Requested that he reach back out to us  if he runs into any additional issues after calling.

## 2023-12-13 ENCOUNTER — Encounter: Payer: Self-pay | Admitting: Physician Assistant

## 2023-12-13 ENCOUNTER — Ambulatory Visit: Admitting: Psychiatry

## 2023-12-13 ENCOUNTER — Ambulatory Visit: Attending: Physician Assistant | Admitting: Physician Assistant

## 2023-12-13 VITALS — BP 130/85 | HR 73 | Temp 98.4°F | Resp 16 | Ht 74.0 in | Wt 316.0 lb

## 2023-12-13 DIAGNOSIS — G5622 Lesion of ulnar nerve, left upper limb: Secondary | ICD-10-CM

## 2023-12-13 DIAGNOSIS — R5383 Other fatigue: Secondary | ICD-10-CM | POA: Diagnosis not present

## 2023-12-13 DIAGNOSIS — M25562 Pain in left knee: Secondary | ICD-10-CM | POA: Diagnosis not present

## 2023-12-13 DIAGNOSIS — F250 Schizoaffective disorder, bipolar type: Secondary | ICD-10-CM | POA: Diagnosis not present

## 2023-12-13 DIAGNOSIS — M5030 Other cervical disc degeneration, unspecified cervical region: Secondary | ICD-10-CM

## 2023-12-13 DIAGNOSIS — F1021 Alcohol dependence, in remission: Secondary | ICD-10-CM | POA: Diagnosis not present

## 2023-12-13 DIAGNOSIS — F411 Generalized anxiety disorder: Secondary | ICD-10-CM

## 2023-12-13 DIAGNOSIS — Z638 Other specified problems related to primary support group: Secondary | ICD-10-CM

## 2023-12-13 DIAGNOSIS — M533 Sacrococcygeal disorders, not elsewhere classified: Secondary | ICD-10-CM

## 2023-12-13 DIAGNOSIS — H209 Unspecified iridocyclitis: Secondary | ICD-10-CM | POA: Diagnosis not present

## 2023-12-13 DIAGNOSIS — M47819 Spondylosis without myelopathy or radiculopathy, site unspecified: Secondary | ICD-10-CM | POA: Diagnosis not present

## 2023-12-13 DIAGNOSIS — Z79899 Other long term (current) drug therapy: Secondary | ICD-10-CM

## 2023-12-13 DIAGNOSIS — J4530 Mild persistent asthma, uncomplicated: Secondary | ICD-10-CM | POA: Diagnosis not present

## 2023-12-13 DIAGNOSIS — F1020 Alcohol dependence, uncomplicated: Secondary | ICD-10-CM

## 2023-12-13 DIAGNOSIS — M546 Pain in thoracic spine: Secondary | ICD-10-CM | POA: Diagnosis not present

## 2023-12-13 DIAGNOSIS — G8929 Other chronic pain: Secondary | ICD-10-CM

## 2023-12-13 DIAGNOSIS — F17200 Nicotine dependence, unspecified, uncomplicated: Secondary | ICD-10-CM

## 2023-12-13 DIAGNOSIS — Z1589 Genetic susceptibility to other disease: Secondary | ICD-10-CM | POA: Diagnosis not present

## 2023-12-13 LAB — CBC WITH DIFFERENTIAL/PLATELET
Absolute Lymphocytes: 2214 {cells}/uL (ref 850–3900)
Absolute Monocytes: 451 {cells}/uL (ref 200–950)
Basophils Absolute: 73 {cells}/uL (ref 0–200)
Basophils Relative: 1.2 %
Eosinophils Absolute: 244 {cells}/uL (ref 15–500)
Eosinophils Relative: 4 %
HCT: 45.3 % (ref 38.5–50.0)
Hemoglobin: 15.8 g/dL (ref 13.2–17.1)
MCH: 31.9 pg (ref 27.0–33.0)
MCHC: 34.9 g/dL (ref 32.0–36.0)
MCV: 91.3 fL (ref 80.0–100.0)
MPV: 12 fL (ref 7.5–12.5)
Monocytes Relative: 7.4 %
Neutro Abs: 3117 {cells}/uL (ref 1500–7800)
Neutrophils Relative %: 51.1 %
Platelets: 189 Thousand/uL (ref 140–400)
RBC: 4.96 Million/uL (ref 4.20–5.80)
RDW: 13.3 % (ref 11.0–15.0)
Total Lymphocyte: 36.3 %
WBC: 6.1 Thousand/uL (ref 3.8–10.8)

## 2023-12-13 NOTE — Telephone Encounter (Signed)
 Patient at [facility] today - confirmed with him that he was able to get everything sorted with the Hyrimoz  copay. NFN  Leiyah Maultsby, PharmD, MPH, BCPS, CPP Clinical Pharmacist [facility])
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
## 2023-12-14 ENCOUNTER — Ambulatory Visit: Payer: Self-pay | Admitting: Physician Assistant

## 2023-12-14 NOTE — Progress Notes (Signed)
 CBC WNL
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
## 2023-12-19 ENCOUNTER — Ambulatory Visit: Admitting: Family Medicine

## 2023-12-19 VITALS — BP 124/81 | HR 94 | Temp 97.6°F | Ht 74.0 in | Wt 318.4 lb

## 2023-12-19 DIAGNOSIS — Z23 Encounter for immunization: Secondary | ICD-10-CM | POA: Diagnosis not present

## 2023-12-19 DIAGNOSIS — R55 Syncope and collapse: Secondary | ICD-10-CM | POA: Diagnosis not present

## 2023-12-19 DIAGNOSIS — Z87898 Personal history of other specified conditions: Secondary | ICD-10-CM | POA: Diagnosis not present
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
## 2023-12-19 NOTE — Addendum Note (Signed)
 Addended by: JODENE CORNERS B on: 12/19/2023 02:27 PM   Modules accepted: Orders

## 2023-12-19 NOTE — Patient Instructions (Signed)
 I think a vasovagal response but will refer to neuro to rule out other pathology. EKG looked normal today.  Syncope, Adult  Syncope is when you pass out or faint for a short time. It is caused by a sudden decrease in blood flow to the brain. This can happen for many reasons. It can sometimes happen when seeing blood, getting a shot (injection), or having pain or strong emotions. Most causes of fainting are not dangerous, but in some cases it can be a sign of a serious medical problem. If you faint, get help right away. Call your local emergency services (911 in the [place] Follow these instructions at home: Watch for any changes in your symptoms. Take these actions to stay safe and help with your symptoms: Knowing when you may be about to faint Signs that you may be about to faint include: Feeling dizzy or light-headed. It may feel like the room is spinning. Feeling weak. Feeling like you may vomit (nauseous). Seeing spots or seeing all white or all black. Having cold, clammy skin. Feeling warm and sweaty. Hearing ringing in the ears. If you start to feel like you might faint, sit or lie down right away. If sitting, lower your head down between your legs. If lying down, raise (elevate) your feet above the level of your heart. Breathe deeply and steadily. Wait until all of the symptoms are gone. Have someone stay with you until you feel better. Medicines Take over-the-counter and prescription medicines only as told by your doctor. If you are taking blood pressure or heart medicine, sit up and stand up slowly. Spend a few minutes getting ready to sit and then stand. This can help you feel less dizzy. Lifestyle Do not drive, use machinery, or play sports until your doctor says it is okay. Do not drink alcohol. Do not smoke or use any products that contain nicotine  or tobacco. If you need help quitting, ask your doctor. Avoid hot tubs and saunas. General instructions Talk with your doctor  about your symptoms. You may need to have testing to help find the cause. Drink enough fluid to keep your pee (urine) pale yellow. Avoid standing for a long time. If you must stand for a long time, do movements such as: Moving your legs. Crossing your legs. Flexing and stretching your leg muscles. Squatting. Keep all follow-up visits. Contact a doctor if: You have episodes of near fainting. Get help right away if: You pass out or faint. You hit your head or are injured after fainting. You have any of these symptoms: Fast or uneven heartbeats (palpitations). Pain in your chest, belly, or back. Shortness of breath. You have jerky movements that you cannot control (seizure). You have a very bad headache. You are confused. You have problems with how you see (vision). You are very weak. You have trouble walking. You are bleeding from your mouth or your butt (rectum). You have black or tarry poop (stool). These symptoms may be an emergency. Get help right away. Call your local emergency services (911 in the [place] Do not wait to see if the symptoms will go away. Do not drive yourself to the hospital. Summary Syncope is when you pass out or faint for a short time. It is caused by a sudden decrease in blood flow to the brain. Signs that you may be about to faint include feeling dizzy or light-headed, feeling like you may vomit, seeing all white or all black, or having cold, clammy skin. If you start  to feel like you might faint, sit or lie down right away. Lower your head if sitting, or raise (elevate) your feet if lying down. Breathe deeply and steadily. Wait until all of the symptoms are gone. This information is not intended to replace advice given to you by your health care provider. Make sure you discuss any questions you have with your health care provider. Document Revised: 07/03/2020 Document Reviewed: 07/03/2020 Elsevier Patient Education  2024 [employer].

## 2023-12-20 ENCOUNTER — Telehealth: Payer: Self-pay | Admitting: Rheumatology

## 2023-12-20 ENCOUNTER — Ambulatory Visit: Payer: Self-pay | Admitting: Family Medicine

## 2023-12-20 LAB — CMP14+EGFR
ALT: 50 IU/L — AB (ref 0–44)
AST: 26 IU/L (ref 0–40)
Albumin: 4.6 g/dL (ref 4.1–5.1)
Alkaline Phosphatase: 65 IU/L (ref 47–123)
BUN/Creatinine Ratio: 19 (ref 9–20)
BUN: 16 mg/dL (ref 6–20)
Bilirubin Total: 0.5 mg/dL (ref 0.0–1.2)
CO2: 25 mmol/L (ref 20–29)
Calcium: 9.8 mg/dL (ref 8.7–10.2)
Chloride: 100 mmol/L (ref 96–106)
Creatinine, Ser: 0.84 mg/dL (ref 0.76–1.27)
Globulin, Total: 2.8 g/dL (ref 1.5–4.5)
Glucose: 89 mg/dL (ref 70–99)
Potassium: 4.7 mmol/L (ref 3.5–5.2)
Sodium: 139 mmol/L (ref 134–144)
Total Protein: 7.4 g/dL (ref 6.0–8.5)
eGFR: 117 mL/min/1.73 (ref >59)

## 2023-12-20 LAB — CBC WITH DIFFERENTIAL/PLATELET
Basophils Absolute: 0.1 x10E3/uL (ref 0.0–0.2)
Basos: 1 %
EOS (ABSOLUTE): 0.4 x10E3/uL (ref 0.0–0.4)
Eos: 8 %
Hematocrit: 46.5 % (ref 37.5–51.0)
Hemoglobin: 15.4 g/dL (ref 13.0–17.7)
Immature Grans (Abs): 0 x10E3/uL (ref 0.0–0.1)
Immature Granulocytes: 0 %
Lymphocytes Absolute: 2.2 x10E3/uL (ref 0.7–3.1)
Lymphs: 42 %
MCH: 30.7 pg (ref 26.6–33.0)
MCHC: 33.1 g/dL (ref 31.5–35.7)
MCV: 93 fL (ref 79–97)
Monocytes Absolute: 0.4 x10E3/uL (ref 0.1–0.9)
Monocytes: 7 %
Neutrophils Absolute: 2.2 x10E3/uL (ref 1.4–7.0)
Neutrophils: 42 %
Platelets: 189 x10E3/uL (ref 150–450)
RBC: 5.01 x10E6/uL (ref 4.14–5.80)
RDW: 12.9 % (ref 11.6–15.4)
WBC: 5.2 x10E3/uL (ref 3.4–10.8)

## 2023-12-20 NOTE — Telephone Encounter (Signed)
 Reviewed-CBC WNL.  ALT is elevated but stable. AST WNL.  Rest of CMP WNL.

## 2023-12-20 NOTE — Telephone Encounter (Signed)
 Patient called stating he had labwork on 12/13/23 and was told one of the labs needed to be repeated.  Patient states his PCP did the labwork yesterday, 12/19/23 and the results are in his chart.

## 2023-12-20 NOTE — Telephone Encounter (Signed)
 Patient advised CBC WNL. ALT is elevated but stable. AST WNL. Rest of CMP WNL.   Patient states PCP was concerned about elevated liver functions and suggested he may need to see GI. Patient would like to know if you thinks he should.

## 2023-12-20 NOTE — Telephone Encounter (Signed)
 PCP suggested GI consultation.

## 2023-12-20 NOTE — Telephone Encounter (Signed)
 Patient advised PCP suggested GI consultation.

## 2024-01-02 ENCOUNTER — Other Ambulatory Visit: Payer: Self-pay | Admitting: Family Medicine

## 2024-01-02 DIAGNOSIS — R748 Abnormal levels of other serum enzymes: Secondary | ICD-10-CM

## 2024-01-10 DIAGNOSIS — H52223 Regular astigmatism, bilateral: Secondary | ICD-10-CM | POA: Diagnosis not present

## 2024-01-16 NOTE — Telephone Encounter (Signed)
 Any updates on GI referral?

## 2024-01-17 ENCOUNTER — Other Ambulatory Visit: Payer: Self-pay | Admitting: Adult Health

## 2024-01-17 DIAGNOSIS — G4700 Insomnia, unspecified: Secondary | ICD-10-CM

## 2024-01-17 DIAGNOSIS — F411 Generalized anxiety disorder: Secondary | ICD-10-CM

## 2024-01-21 ENCOUNTER — Encounter: Payer: Self-pay | Admitting: Family Medicine

## 2024-01-23 NOTE — Telephone Encounter (Signed)
 Looks like this was a response to you

## 2024-01-26 ENCOUNTER — Encounter: Payer: Self-pay | Admitting: Family Medicine

## 2024-01-26 NOTE — Telephone Encounter (Signed)
 Please call patient with update asap.

## 2024-02-06 ENCOUNTER — Ambulatory Visit (INDEPENDENT_AMBULATORY_CARE_PROVIDER_SITE_OTHER): Payer: Self-pay | Admitting: Psychiatry

## 2024-02-06 DIAGNOSIS — F250 Schizoaffective disorder, bipolar type: Secondary | ICD-10-CM

## 2024-02-06 NOTE — Progress Notes (Deleted)
 Psychotherapy Progress Note [facility] [name], PhD LP  Patient ID: Leon Mason Leon Mason)    MRN: [id number] Therapy format: {Therapy Types:21967::Individual psychotherapy} Date: 02/06/2024      Start: ***:***     Stop: ***:***     Time Spent: *** min Location: {SvcLoc:22530::In-person}   Session narrative (presenting needs, interim history, self-report of stressors and symptoms, applications of prior therapy, status changes, and interventions made in session) ***  Therapeutic modalities: {AM:23362::Cognitive Behavioral Therapy,Solution-Oriented/Positive Psychology}  Mental Status/Observations:  Appearance:   {PSY:22683}     Behavior:  {PSY:21022743}  Motor:  {PSY:22302}  Speech/Language:   {PSY:22685}  Affect:  {PSY:22687}  Mood:  {PSY:31886}  Thought process:  {PSY:31888}  Thought content:    {PSY:[phone]}  Sensory/Perceptual disturbances:    {[phone]}  Orientation:  {Psych Orientation:23301::Fully oriented}  Attention:  {Good-Fair-Poor ratings:23770::Good}    Concentration:  {Good-Fair-Poor ratings:23770::Good}  Memory:  {[phone]}  Insight:    {Good-Fair-Poor ratings:23770::Good}  Judgment:   {Good-Fair-Poor ratings:23770::Good}  Impulse Control:  {Good-Fair-Poor ratings:23770::Good}   Risk Assessment: Danger to Self: {Risk:22599::No} Self-injurious Behavior: {Risk:22599::No} Danger to Others: {Risk:22599::No} Physical Aggression / Violence: {Risk:22599::No} Duty to Warn: {AMYesNo:22526::No} Access to Firearms a concern: {AMYesNo:22526::No}  Assessment of progress:  {Progress:22147::progressing}  Diagnosis: No diagnosis found. Plan:  *** Other recommendations/advice -- As may be noted above.  Continue to utilize previously learned skills ad lib. Medication compliance -- Maintain medication as prescribed and work faithfully with relevant prescriber(s) if any changes are desired  or seem indicated. Crisis service -- Aware of call list and work-in appts.  Call the clinic on-call service, 988/hotline, 911, or present to [facility] or ER if any life-threatening psychiatric crisis. Followup -- No follow-ups on file.  Next scheduled visit with me 03/12/2024.  Next scheduled in this office 02/09/2024.  [name], PhD [name], PhD LP Clinical Psychologist, [facility] [address], [address] [place], [place] [address] [phone]

## 2024-02-06 NOTE — Progress Notes (Signed)
 No-show/Short-notice cancellation charge note  Patient ID: Leon Mason     MRN: [id number]     Date: 02/06/2024     Appt time: 1pm  Failed to show for appointment at 1pm, no message received.  Charge reduced fee in light of financial hardships.  RS as able.    Followup -- Next scheduled visit with me 03/12/2024.  Next scheduled in this office 02/09/2024.  [name], PhD [name], PhD LP Clinical Psychologist, [facility] [address], [address] [place], [place] [address] [phone]

## 2024-02-09 ENCOUNTER — Encounter: Payer: Self-pay | Admitting: Adult Health

## 2024-02-09 ENCOUNTER — Telehealth: Admitting: Adult Health

## 2024-02-09 ENCOUNTER — Telehealth: Payer: Self-pay

## 2024-02-09 DIAGNOSIS — F1090 Alcohol use, unspecified, uncomplicated: Secondary | ICD-10-CM | POA: Diagnosis not present

## 2024-02-09 DIAGNOSIS — F411 Generalized anxiety disorder: Secondary | ICD-10-CM | POA: Diagnosis not present

## 2024-02-09 DIAGNOSIS — F209 Schizophrenia, unspecified: Secondary | ICD-10-CM | POA: Diagnosis not present

## 2024-02-09 DIAGNOSIS — G4700 Insomnia, unspecified: Secondary | ICD-10-CM | POA: Diagnosis not present

## 2024-02-09 DIAGNOSIS — F1021 Alcohol dependence, in remission: Secondary | ICD-10-CM

## 2024-02-09 DIAGNOSIS — F250 Schizoaffective disorder, bipolar type: Secondary | ICD-10-CM

## 2024-02-09 NOTE — Telephone Encounter (Signed)
 Per Tillman:   1 - Reduce perphenazine  from 8mg  to 6mg  daily for one to two weeks, then reduce to 4 milligrams daily for another one to two weeks. 2 - Start the Abilify at 5 mg daily while continuing the perphenazine  at 4 mg daily for 1 to 2 weeks, then stop the Perpehnazine.  Also advised patient to decrease alcohol consumption along with discontinuation of THC due to negative impact on mood stability.

## 2024-02-09 NOTE — Progress Notes (Signed)
 Leon Mason [id number] 24-Apr-1990 33 y.o.  Virtual Visit via Video Note  I connected with pt @ on 02/09/24 at 10:00 AM EST by a video enabled telemedicine application and verified that I am speaking with the correct person using two identifiers.   I discussed the limitations of evaluation and management by telemedicine and the availability of in person appointments. The patient expressed understanding and agreed to proceed.  I discussed the assessment and treatment plan with the patient. The patient was provided an opportunity to ask questions and all were answered. The patient agreed with the plan and demonstrated an understanding of the instructions.   The patient was advised to call back or seek an in-person evaluation if the symptoms worsen or if the condition fails to improve as anticipated.  I provided 25 minutes of non-face-to-face time during this encounter.  The patient was located at home.  The provider was located at [facility].   Angeline LOISE Sayers, NP   Subjective:   Patient ID:  Leon Mason is a 33 y.o. (DOB [birth year]) male.  Chief Complaint: No chief complaint on file.   HPI Leon Mason presents for follow-up of GAD, insomnia, Schizophrenia and alcohol use disorder.   Describes mood today as not the best. Pleasant. Denies tearfulness. Mood symptoms - reports feeling depressed - all the time. Reports decreased interest and motivation. Reports anxiety at times. Reports feeling stressed. Denies irritability. Denies panic attacks. Reports some worry rumination and over thinking. Reports financial stressors. Reports abusing alcohol and THC. Reports mood is lower. Stating I don't feel like I'm doing too good. Feels like medications are helpful, but is willing to consider other options. Taking medications as prescribed. Energy levels stable. Active, does not have a regular exercise routine.  Enjoys some usual interests and activities. In a  relationship - lives with partner. Mother (disabled) and brother live 25 minutes away - seeing every 2 weeks. Spending time with friends.  Appetite adequate - has been eating a healthy diet. Weight loss - 315 from 326 pounds. Reports sleeping well most nights. Averages 8 or more hours. Reports nightmares. Focus and concentration stable. Completing tasks. Managing aspects of household. Working 7 days a week - [employer].   Denies SI. Denies HI. Denies AH or VH. Reports some paranoid thoughts - state of the country/work. Denies self harm. Reports THC use - once or twice a week. Reports alcohol every day - 8 beers a day   Seeing [name] for therapy.  Previous medications: Perphenazine , Lithium , Sertraline , Haldol, Zyprexa, Risperdal , stopped due to various side effects, ie biting tongue a lot, restless legs, weight gain  Review of Systems:  Review of Systems  Musculoskeletal:  Negative for gait problem.  Neurological:  Negative for tremors.  Psychiatric/Behavioral:         Please refer to HPI    Medications: I have reviewed the patient's current medications.  Current Outpatient Medications  Medication Sig Dispense Refill   sertraline  (ZOLOFT ) 50 MG tablet TAKE 1 TABLET BY MOUTH EVERY DAY 30 tablet 1   albuterol  (PROAIR  HFA) 108 (90 Base) MCG/ACT inhaler Inhale 2 puffs into the lungs every 6 (six) hours as needed for shortness of breath. 6.7 g 0   cetirizine (ZYRTEC) 10 MG tablet Take 10 mg by mouth daily.     cycloSPORINE (RESTASIS) 0.05 % ophthalmic emulsion 1 drop 2 (two) times daily.     fluticasone -salmeterol (ADVAIR ) 250-50 MCG/ACT AEPB Inhale 1 puff into the lungs daily.  folic acid  (FOLVITE ) 1 MG tablet Take 1 tablet (1 mg total) by mouth daily. (Patient not taking: Reported on 12/19/2023) 90 tablet 1   HYRIMOZ  40 MG/0.4ML SOAJ INJECT 1 PEN UNDER THE SKIN EVERY 14 DAYS 2.4 mL 0   IBUPROFEN  PO Take by mouth as needed.     ketoconazole  (NIZORAL ) 2 % cream Apply 1  Application topically daily. (Patient not taking: Reported on 12/19/2023) 15 g 0   levocetirizine (XYZAL ) 5 MG tablet Take 1 tablet (5 mg total) by mouth at bedtime as needed for allergies (runny nose). (Patient not taking: Reported on 12/19/2023) 90 tablet 3   Multiple Vitamin (MULTIVITAMIN PO) Take by mouth daily.     Multiple Vitamins-Minerals (ZINC PO) Take by mouth.     perphenazine  (TRILAFON ) 4 MG tablet Take 1 tablet (4 mg total) by mouth 2 (two) times daily. 180 tablet 1   UNABLE TO FIND Med Name: Bug spray topically, daily use. (Patient not taking: Reported on 12/19/2023)     No current facility-administered medications for this visit.    Medication Side Effects: None  Allergies:  Allergies  Allergen Reactions   Other Hives and Shortness Of Breath    Dogs and Cats.   Dust Mite Extract    Pollen Extract     Past Medical History:  Diagnosis Date   Anxiety    Asthma    Bipolar 1 disorder (HCC)    Hallucinations    Mild intermittent asthma with (acute) exacerbation 02/18/2019   Psychosis (HCC) 06/14/2013   Schizo-affective schizophrenia, chronic condition with acute exacerbation (HCC) 03/28/2018   Schizoaffective disorder (HCC)    Schizophrenia (HCC)    SIRS (systemic inflammatory response syndrome) (HCC) 02/18/2019    Family History  Problem Relation Age of Onset   Bipolar disorder Mother    Alcoholism Mother    Diabetes Mother    Cervical cancer Mother    OCD Father    Bipolar disorder Father    Eczema Brother    Post-traumatic stress disorder Brother    COPD Maternal Aunt    Heart attack Maternal Grandmother    Breast cancer Maternal Grandmother    Heart attack Maternal Grandfather    Cancer Paternal Grandmother    Heart attack Paternal Grandfather    Asthma Neg Hx    Allergic rhinitis Neg Hx    Angioedema Neg Hx    Urticaria Neg Hx    Immunodeficiency Neg Hx    Atopy Neg Hx    Prostate cancer Neg Hx    Colon cancer Neg Hx     Social History    Socioeconomic History   Marital status: Single    Spouse name: Not on file   Number of children: Not on file   Years of education: Not on file   Highest education level: Some college, no degree  Occupational History   Not on file  Tobacco Use   Smoking status: Former    Current packs/day: 0.00    Types: Cigarettes    Start date: 11/27/2017    Quit date: 05/28/2018    Years since quitting: 5.7    Passive exposure: Past   Smokeless tobacco: Never  Vaping Use   Vaping status: Former  Substance and Sexual Activity   Alcohol use: Yes    Alcohol/week: 3.0 standard drinks of alcohol    Types: 3 Cans of beer per week    Comment: 36 oz a week   Drug use: Not Currently    Types:  Marijuana   Sexual activity: Not Currently  Other Topics Concern   Not on file  Social History Narrative   Not on file   Social Drivers of Health   Financial Resource Strain: Medium Risk (12/19/2023)   Overall Financial Resource Strain (CARDIA)    Difficulty of Paying Living Expenses: Somewhat hard  Food Insecurity: Food Insecurity Present (12/19/2023)   Hunger Vital Sign    Worried About Running Out of Food in the Last Year: Often true    Ran Out of Food in the Last Year: Sometimes true  Transportation Needs: No Transportation Needs (12/19/2023)   PRAPARE - Administrator, Civil Service (Medical): No    Lack of Transportation (Non-Medical): No  Physical Activity: Sufficiently Active (12/19/2023)   Exercise Vital Sign    Days of Exercise per Week: 5 days    Minutes of Exercise per Session: 120 min  Stress: Stress Concern Present (12/19/2023)   [employer] of Occupational Health - Occupational Stress Questionnaire    Feeling of Stress: To some extent  Social Connections: Socially Integrated (12/19/2023)   Social Connection and Isolation Panel    Frequency of Communication with Friends and Family: More than three times a week    Frequency of Social Gatherings with Friends and  Family: Once a week    Attends Religious Services: More than 4 times per year    Active Member of [employer] or Organizations: Yes    Attends Banker Meetings: 1 to 4 times per year    Marital Status: Living with partner  Intimate Partner Violence: Unknown (06/12/2021)   Received from [facility]   HITS    Physically Hurt: Not on file    Insult or Talk Down To: Not on file    Threaten Physical Harm: Not on file    Scream or Curse: Not on file    Past Medical History, Surgical history, Social history, and Family history were reviewed and updated as appropriate.   Please see review of systems for further details on the patient's review from today.   Objective:   Physical Exam:  There were no vitals taken for this visit.  Physical Exam Constitutional:      General: He is not in acute distress. Musculoskeletal:        General: No deformity.  Neurological:     Mental Status: He is alert and oriented to person, place, and time.     Coordination: Coordination normal.  Psychiatric:        Attention and Perception: Attention and perception normal. He does not perceive auditory or visual hallucinations.        Mood and Affect: Mood normal. Mood is not anxious or depressed. Affect is not labile, blunt, angry or inappropriate.        Speech: Speech normal.        Behavior: Behavior normal.        Thought Content: Thought content normal. Thought content is not paranoid or delusional. Thought content does not include homicidal or suicidal ideation. Thought content does not include homicidal or suicidal plan.        Cognition and Memory: Cognition and memory normal.        Judgment: Judgment normal.     Comments: Insight intact     Lab Review:     Component Value Date/Time   NA 139 12/19/2023 1407   K 4.7 12/19/2023 1407   CL 100 12/19/2023 1407   CO2 25 12/19/2023 1407  GLUCOSE 89 12/19/2023 1407   GLUCOSE 93 09/07/2023 1414   BUN 16 12/19/2023 1407   CREATININE 0.84  12/19/2023 1407   CREATININE 0.86 09/07/2023 1414   CALCIUM 9.8 12/19/2023 1407   PROT 7.4 12/19/2023 1407   ALBUMIN 4.6 12/19/2023 1407   AST 26 12/19/2023 1407   ALT 50 (H) 12/19/2023 1407   ALKPHOS 65 12/19/2023 1407   BILITOT 0.5 12/19/2023 1407   GFRNONAA >60 08/25/2022 1243   GFRAA >60 02/19/2019 0602       Component Value Date/Time   WBC 5.2 12/19/2023 1407   WBC 6.1 12/13/2023 1329   RBC 5.01 12/19/2023 1407   RBC 4.96 12/13/2023 1329   HGB 15.4 12/19/2023 1407   HCT 46.5 12/19/2023 1407   PLT 189 12/19/2023 1407   MCV 93 12/19/2023 1407   MCH 30.7 12/19/2023 1407   MCH 31.9 12/13/2023 1329   MCHC 33.1 12/19/2023 1407   MCHC 34.9 12/13/2023 1329   RDW 12.9 12/19/2023 1407   LYMPHSABS 2.2 12/19/2023 1407   MONOABS 0.7 08/25/2022 1243   EOSABS 0.4 12/19/2023 1407   BASOSABS 0.1 12/19/2023 1407    Lithium  Lvl  Date Value Ref Range Status  03/30/2018 <0.06 (L) 0.60 - 1.20 mmol/L Final    Comment:    REPEATED TO VERIFY Performed at [facility], [address] [place], [place] [address]      No results found for: PHENYTOIN, PHENOBARB, VALPROATE, CBMZ   .res Assessment: Plan:    Plan:  PDMP reviewed  Continue: Zoloft  50mg    Change: Leon Mason - not doing good with current medication regimen and would like to consider other treatment options.   1 - Reduce perphenazine  from 8mg  to 6mg  daily for one to two weeks, then reduce to 4mg  daily for another one to two weeks. 2 - Start the Abilify at 5mg  daily while continuing the perphenazine  at 4mg  daily for 1 to 2 weeks, then stop the Perpehnazine.  Also advised patient to decrease alcohol consumption along with discontinuation of THC due to negative impact on mood stability.  Therapist - [name].  EKG 06/01/2023   RTC 3 months  25 minutes spent dedicated to the care of this patient on the date of this encounter to include pre-visit review of records, ordering of  medication, post visit documentation, and face-to-face time with the patient discussing MDD, GAD, insomnia, alcohol use and Schizophrenia. Discussed continuing current medication regimen - will consider other SGA for mood stabilization.   Patient advised to contact office with any questions, adverse effects, or acute worsening in signs and symptoms.  Discussed potential metabolic side effects associated with atypical antipsychotics, as well as potential risk for movement side effects. Advised pt to contact office if movement side effects occur.    There are no diagnoses linked to this encounter.   Please see After Visit Summary for patient specific instructions.  Future Appointments  Date Time Provider Department Center  02/09/2024 10:00 AM Lakena Sparlin, Angeline Mattocks, NP [facility] None  03/12/2024  1:00 PM Kendall Charleston, PhD [facility] None  04/09/2024  1:00 PM Kendall Charleston, PhD [facility] None  05/15/2024  1:50 PM Cheryl Waddell HERO, PA-C [facility] None    No orders of the defined types were placed in this encounter.     -------------------------------

## 2024-02-14 ENCOUNTER — Other Ambulatory Visit: Payer: Self-pay

## 2024-02-14 MED ORDER — ARIPIPRAZOLE 5 MG PO TABS: 5.0000 mg | ORAL_TABLET | Freq: Every day | ORAL | 1 refills | Status: DC

## 2024-03-02 ENCOUNTER — Other Ambulatory Visit: Payer: Self-pay | Admitting: Physician Assistant

## 2024-03-02 DIAGNOSIS — M47819 Spondylosis without myelopathy or radiculopathy, site unspecified: Secondary | ICD-10-CM

## 2024-03-02 DIAGNOSIS — Z79899 Other long term (current) drug therapy: Secondary | ICD-10-CM

## 2024-03-02 DIAGNOSIS — H209 Unspecified iridocyclitis: Secondary | ICD-10-CM

## 2024-03-02 NOTE — Telephone Encounter (Signed)
 Last Fill: 11/22/2023  Labs: 12/19/2023 ALT 50 Rest of CBC and CMP WNL  TB Gold: 09/07/2023 TB Gold Negative   Next Visit: 05/15/2024  Last Visit: 12/13/2023  DX: Spondyloarthropathy   Current Dose per office note 12/13/2023: Hyrimoz  40 mg sq injections every 14 days   Okay to refill Hyrimoz ?

## 2024-03-12 ENCOUNTER — Ambulatory Visit: Admitting: Psychiatry

## 2024-03-12 DIAGNOSIS — F411 Generalized anxiety disorder: Secondary | ICD-10-CM

## 2024-03-12 DIAGNOSIS — Z720 Tobacco use: Secondary | ICD-10-CM

## 2024-03-12 DIAGNOSIS — F1021 Alcohol dependence, in remission: Secondary | ICD-10-CM

## 2024-03-12 DIAGNOSIS — F250 Schizoaffective disorder, bipolar type: Secondary | ICD-10-CM
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
## 2024-03-20 ENCOUNTER — Telehealth: Payer: Self-pay | Admitting: Rheumatology

## 2024-03-20 DIAGNOSIS — Z79899 Other long term (current) drug therapy: Secondary | ICD-10-CM

## 2024-03-20 DIAGNOSIS — H209 Unspecified iridocyclitis: Secondary | ICD-10-CM

## 2024-03-20 DIAGNOSIS — M47819 Spondylosis without myelopathy or radiculopathy, site unspecified: Secondary | ICD-10-CM

## 2024-03-20 NOTE — Telephone Encounter (Signed)
 Patient called stating he has new insurance:  [employer] Member ID #[id number] Group [id number] Payer ID [id number] Rx BIN [id number] Rx PCN [id number]  Effective date:  04/08/24  Patient states he is out of his Hyrimoz  medication and is requesting samples.

## 2024-03-20 NOTE — Telephone Encounter (Signed)
 Called patient and advised we do not have any samples of hyrimoz . Advised I would get message to the pharmacy team for authorization through new insurance. Patient states he may also need patient assistance as he does not think his insurance will cover the cost. Patient took his last hyrimoz  injection a couple of days ago.

## 2024-03-21 ENCOUNTER — Other Ambulatory Visit (HOSPITAL_COMMUNITY): Payer: Self-pay

## 2024-03-21 NOTE — Telephone Encounter (Signed)
 Submitted an expedited Prior Authorization request to [employer] for HYRIMOZ  (ADALIMUMAB -ADAZ) via CoverMyMeds. Will update once we receive a response.  Key: [id number]

## 2024-03-22 ENCOUNTER — Other Ambulatory Visit: Payer: Self-pay

## 2024-03-22 ENCOUNTER — Other Ambulatory Visit: Payer: Self-pay | Admitting: Adult Health

## 2024-03-22 MED ORDER — ARIPIPRAZOLE 5 MG PO TABS: 5.0000 mg | ORAL_TABLET | Freq: Every day | ORAL | 1 refills | Status: AC

## 2024-03-26 NOTE — Telephone Encounter (Signed)
 Received a fax regarding Prior Authorization from [employer] for HYRIMOZ  (ADALIMUMAB -ADAZ). Authorization has been DENIED because preferred products are:     Submitted a Prior Authorization request to [employer] for HADLIMA  (ADALIMUMAB -BWWD) via CoverMyMeds. Will update once we receive a response.  Key: [id number]

## 2024-03-27 ENCOUNTER — Other Ambulatory Visit (HOSPITAL_COMMUNITY): Payer: Self-pay

## 2024-03-27 MED ORDER — ADALIMUMAB-BWWD 40 MG/0.4ML ~~LOC~~ SOAJ: 40.0000 mg | SUBCUTANEOUS | 0 refills | Status: DC

## 2024-03-27 NOTE — Telephone Encounter (Signed)
 Received notification from [facility] [employer]) regarding a prior authorization for HADLIMA  (ADALIMUMAB -BWWD). Authorization has been APPROVED from 03/26/2024 to 03/26/2025. Approval letter sent to scan center.  Per test claim, copay for 28 days supply is $950  Authorization # [id number]  Patient enrolled into Hadlima  copay card: RxBIN: [id number] RxPCN: [id number] RxGroup: [id number] ID: [id number]  MyChart message sent to patient  Sherry Pennant, PharmD, MPH, BCPS, CPP Clinical Pharmacist

## 2024-03-27 NOTE — Telephone Encounter (Signed)
 Called [facility] to confirm receipt of Hadlima  rx.  Provided verbal and request that electronic rx be discontinued once received. Provided verbal to   Patient is due for labs. MyChart message.  Sherry Pennant, PharmD, MPH, BCPS, CPP Clinical Pharmacist

## 2024-04-02 ENCOUNTER — Other Ambulatory Visit: Payer: Self-pay | Admitting: Adult Health

## 2024-04-02 DIAGNOSIS — G4700 Insomnia, unspecified: Secondary | ICD-10-CM

## 2024-04-02 DIAGNOSIS — F411 Generalized anxiety disorder: Secondary | ICD-10-CM

## 2024-04-06 ENCOUNTER — Telehealth: Payer: Self-pay | Admitting: Physician Assistant

## 2024-04-06 MED ORDER — PREDNISONE 5 MG PO TABS: ORAL_TABLET | ORAL | 0 refills | Status: AC

## 2024-04-06 NOTE — Telephone Encounter (Signed)
 I sent in a prednisone  taper starting at 20 mg tapering by 5 mg every 4 days.

## 2024-04-06 NOTE — Telephone Encounter (Signed)
 Contacted the patient and patient states he has been out of his medication because of his insurance change. Patient states he has now been switched to Hadlima , which he expects to arrive today. Patient states after being off his medication, all of his symptoms have come back including pain in his knees and feet. Patient states he is having a lot of pain and is not sure what to do. If anything can be sent in, [facility] [address] in [place]. Please advise.

## 2024-04-09 ENCOUNTER — Ambulatory Visit: Admitting: Psychiatry

## 2024-04-09 NOTE — Telephone Encounter (Signed)
 Patient advised prescription for Prednisone  sent to the pharmacy. Patient verbalized understanding.

## 2024-04-11 ENCOUNTER — Other Ambulatory Visit: Payer: Self-pay | Admitting: Rheumatology

## 2024-04-11 DIAGNOSIS — M47819 Spondylosis without myelopathy or radiculopathy, site unspecified: Secondary | ICD-10-CM

## 2024-04-11 DIAGNOSIS — H209 Unspecified iridocyclitis: Secondary | ICD-10-CM

## 2024-04-11 DIAGNOSIS — Z79899 Other long term (current) drug therapy: Secondary | ICD-10-CM

## 2024-04-11 NOTE — Telephone Encounter (Signed)
 Last Fill: 03/27/2024  Labs: 12/19/2023 ALT 50  TB Gold: 09/07/2023  TB gold negative   Next Visit: 05/15/2024  Last Visit: 12/13/2023   DX: Spondyloarthropathy   Current Dose per office note 12/13/2023: Hyrimoz  40 mg sq injections every 14 days   Patient changed to Hadlima  in telephone note from 03/20/2024. Attempted to contact the patient to advise he is due to update labs. Left the patient a message advising lab hours.   Okay to refill Hadlima ?

## 2024-05-07 ENCOUNTER — Ambulatory Visit (INDEPENDENT_AMBULATORY_CARE_PROVIDER_SITE_OTHER): Admitting: Psychiatry

## 2024-05-15 ENCOUNTER — Ambulatory Visit: Admitting: Physician Assistant
